# Patient Record
Sex: Female | Born: 1985
Health system: Southern US, Community
[De-identification: ages and names within clinical notes are randomized; demographics above are authoritative.]

## PROBLEM LIST (undated history)

## (undated) ENCOUNTER — Inpatient Hospital Stay (HOSPITAL_COMMUNITY): Payer: Self-pay

## (undated) DIAGNOSIS — J45909 Unspecified asthma, uncomplicated: Secondary | ICD-10-CM

## (undated) DIAGNOSIS — N898 Other specified noninflammatory disorders of vagina: Secondary | ICD-10-CM

## (undated) DIAGNOSIS — R87629 Unspecified abnormal cytological findings in specimens from vagina: Secondary | ICD-10-CM

## (undated) DIAGNOSIS — E079 Disorder of thyroid, unspecified: Secondary | ICD-10-CM

## (undated) DIAGNOSIS — Z3491 Encounter for supervision of normal pregnancy, unspecified, first trimester: Secondary | ICD-10-CM

## (undated) DIAGNOSIS — T7840XA Allergy, unspecified, initial encounter: Secondary | ICD-10-CM

## (undated) DIAGNOSIS — Z3480 Encounter for supervision of other normal pregnancy, unspecified trimester: Secondary | ICD-10-CM

## (undated) DIAGNOSIS — F32A Depression, unspecified: Secondary | ICD-10-CM

## (undated) DIAGNOSIS — K219 Gastro-esophageal reflux disease without esophagitis: Secondary | ICD-10-CM

## (undated) DIAGNOSIS — K589 Irritable bowel syndrome without diarrhea: Secondary | ICD-10-CM

## (undated) DIAGNOSIS — G43909 Migraine, unspecified, not intractable, without status migrainosus: Secondary | ICD-10-CM

## (undated) DIAGNOSIS — Z3490 Encounter for supervision of normal pregnancy, unspecified, unspecified trimester: Secondary | ICD-10-CM

## (undated) DIAGNOSIS — F419 Anxiety disorder, unspecified: Secondary | ICD-10-CM

## (undated) DIAGNOSIS — F329 Major depressive disorder, single episode, unspecified: Secondary | ICD-10-CM

## (undated) DIAGNOSIS — K2970 Gastritis, unspecified, without bleeding: Secondary | ICD-10-CM

## (undated) DIAGNOSIS — O039 Complete or unspecified spontaneous abortion without complication: Secondary | ICD-10-CM

## (undated) HISTORY — DX: Anxiety disorder, unspecified: F41.9

## (undated) HISTORY — DX: Migraine, unspecified, not intractable, without status migrainosus: G43.909

## (undated) HISTORY — DX: Depression, unspecified: F32.A

## (undated) HISTORY — DX: Other specified noninflammatory disorders of vagina: N89.8

## (undated) HISTORY — DX: Encounter for supervision of normal pregnancy, unspecified, unspecified trimester: Z34.90

## (undated) HISTORY — PX: CYST REMOVAL NECK: SHX6281

## (undated) HISTORY — DX: Unspecified asthma, uncomplicated: J45.909

## (undated) HISTORY — DX: Gastritis, unspecified, without bleeding: K29.70

## (undated) HISTORY — DX: Encounter for supervision of other normal pregnancy, unspecified trimester: Z34.80

## (undated) HISTORY — DX: Unspecified abnormal cytological findings in specimens from vagina: R87.629

## (undated) HISTORY — DX: Disorder of thyroid, unspecified: E07.9

## (undated) HISTORY — DX: Encounter for supervision of normal pregnancy, unspecified, first trimester: Z34.91

## (undated) HISTORY — DX: Irritable bowel syndrome, unspecified: K58.9

## (undated) HISTORY — DX: Complete or unspecified spontaneous abortion without complication: O03.9

## (undated) HISTORY — DX: Gastro-esophageal reflux disease without esophagitis: K21.9

## (undated) HISTORY — DX: Major depressive disorder, single episode, unspecified: F32.9

## (undated) HISTORY — DX: Allergy, unspecified, initial encounter: T78.40XA

---
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
## 2008-09-23 ENCOUNTER — Other Ambulatory Visit: Admission: RE | Admit: 2008-09-23 | Discharge: 2008-09-23 | Payer: Self-pay | Admitting: Obstetrics & Gynecology

## 2009-04-11 ENCOUNTER — Ambulatory Visit: Payer: Self-pay | Admitting: Obstetrics & Gynecology

## 2009-04-11 ENCOUNTER — Inpatient Hospital Stay (HOSPITAL_COMMUNITY): Admission: AD | Admit: 2009-04-11 | Discharge: 2009-04-12 | Payer: Self-pay | Admitting: Obstetrics & Gynecology

## 2009-04-11 ENCOUNTER — Inpatient Hospital Stay (HOSPITAL_COMMUNITY): Admission: AD | Admit: 2009-04-11 | Discharge: 2009-04-11 | Payer: Self-pay | Admitting: Obstetrics & Gynecology

## 2009-04-12 ENCOUNTER — Inpatient Hospital Stay (HOSPITAL_COMMUNITY): Admission: AD | Admit: 2009-04-12 | Discharge: 2009-04-14 | Payer: Self-pay | Admitting: Obstetrics and Gynecology

## 2009-04-12 ENCOUNTER — Ambulatory Visit: Payer: Self-pay | Admitting: Family Medicine

## 2009-10-20 ENCOUNTER — Encounter (INDEPENDENT_AMBULATORY_CARE_PROVIDER_SITE_OTHER): Payer: Self-pay | Admitting: *Deleted

## 2009-10-20 ENCOUNTER — Ambulatory Visit (HOSPITAL_COMMUNITY): Admission: RE | Admit: 2009-10-20 | Discharge: 2009-10-20 | Payer: Self-pay | Admitting: Family Medicine

## 2009-10-26 DIAGNOSIS — K5900 Constipation, unspecified: Secondary | ICD-10-CM | POA: Insufficient documentation

## 2009-10-26 DIAGNOSIS — J45909 Unspecified asthma, uncomplicated: Secondary | ICD-10-CM | POA: Insufficient documentation

## 2009-10-30 ENCOUNTER — Ambulatory Visit: Payer: Self-pay | Admitting: Gastroenterology

## 2009-10-30 DIAGNOSIS — R1013 Epigastric pain: Secondary | ICD-10-CM

## 2009-10-30 DIAGNOSIS — K3189 Other diseases of stomach and duodenum: Secondary | ICD-10-CM

## 2009-10-30 DIAGNOSIS — K589 Irritable bowel syndrome without diarrhea: Secondary | ICD-10-CM | POA: Insufficient documentation

## 2009-12-19 ENCOUNTER — Ambulatory Visit: Payer: Self-pay | Admitting: Gastroenterology

## 2010-01-25 ENCOUNTER — Other Ambulatory Visit: Admission: RE | Admit: 2010-01-25 | Discharge: 2010-01-25 | Payer: Self-pay | Admitting: Obstetrics and Gynecology

## 2010-06-21 NOTE — Letter (Signed)
Summary: Results Letter  [facility]  [address] [place], [place] [address]   Phone: [phone]  Fax: [phone]        October 30, 2009 MRN: [id number]    ROLINDA IMPSON [address] [place], [place]  [address]    Dear Ms. Roney Marion,  It is my pleasure to have treated you recently as a new patient in my office. I appreciate your confidence and the opportunity to participate in your care.  Since I do have a busy inpatient endoscopy schedule and office schedule, my office hours vary weekly. I am, however, available for emergency calls everyday through my office. If I am not available for an urgent office appointment, another one of our gastroenterologist will be able to assist you.  My well-trained staff are prepared to help you at all times. For emergencies after office hours, a physician from our Gastroenterology section is always available through my 24 hour answering service  Once again I welcome you as a new patient and I look forward to a happy and healthy relationship             Sincerely,  Louis Meckel MD  This letter has been electronically signed by your physician.  Appended Document: Results Letter LETTER MAILED

## 2010-06-21 NOTE — Assessment & Plan Note (Signed)
Summary: F/U APPT...LSW.   History of Present Illness Visit Type: Follow-up Visit Primary GI MD: Melvia Heaps MD [facility] Primary Provider: Lilyan Punt, MD Requesting Provider: na Chief Complaint: F/u for Epigastric pain. Pt c/o mucus after BMs  History of Present Illness:   [name] has returned for followup of her abdominal pain.  Pain has primarily subsided.  She occasionally has postprandial upper abdominal discomfort that tends to occur when  she eats greasy foods or high in sugar content such as sodas.  She has intermittent nausea which tends to occur in the evenings.  On a few occasions she has seen some mucus in the stools.  She denies melena  or hematochezia.   GI Review of Systems      Denies abdominal pain, acid reflux, belching, bloating, chest pain, dysphagia with liquids, dysphagia with solids, heartburn, loss of appetite, nausea, vomiting, vomiting blood, weight loss, and  weight gain.        Denies anal fissure, black tarry stools, change in bowel habit, constipation, diarrhea, diverticulosis, fecal incontinence, heme positive stool, hemorrhoids, irritable bowel syndrome, jaundice, light color stool, liver problems, rectal bleeding, and  rectal pain.    Current Medications (verified): 1)  Maxalt 10 Mg Tabs (Rizatriptan Benzoate) .... Take One By Mouth As Needed 2)  Ativan 1 Mg Tabs (Lorazepam) .... Take One By Mouth Once Daily 3)  Zofran 4 Mg Tabs (Ondansetron Hcl) .... Take One By Mouth As Needed 4)  Carafate 1 Gm/54ml Susp (Sucralfate) .... Take One Gram After Meals and At Bedtime 5)  Ventolin Hfa 108 (90 Base) Mcg/act Aers (Albuterol Sulfate) .... Use As Directed As Needed 6)  Prozac 10 Mg Caps (Fluoxetine Hcl) .... One Tablet By Mouth Once Daily 7)  Align  Caps (Probiotic Product) .... One Capsule By Mouth Once Daily  Allergies (verified): 1)  ! Advil  Past History:  Past Medical History: Reviewed history from 10/26/2009 and no changes required. Current  Problems:  ASTHMA (ICD-493.90) ANXIETY (ICD-300.00) CONSTIPATION (ICD-564.00)  Past Surgical History: Reviewed history from 10/30/2009 and no changes required. Unremarkable  Family History: Reviewed history from 10/30/2009 and no changes required. No FH of Colon Cancer: Melanoma-uncle/mother and brother (precancerous)  Social History: Reviewed history from 10/30/2009 and no changes required. Patient has never smoked.  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  The patient denies allergy/sinus, anemia, anxiety-new, arthritis/joint pain, back pain, blood in urine, breast changes/lumps, change in vision, confusion, cough, coughing up blood, depression-new, fainting, fatigue, fever, hearing problems, heart murmur, heart rhythm changes, itching, menstrual pain, muscle pains/cramps, night sweats, nosebleeds, pregnancy symptoms, shortness of breath, skin rash, sleeping problems, sore throat, swelling of feet/legs, swollen lymph glands, thirst - excessive , urination - excessive , urination changes/pain, urine leakage, vision changes, and voice change.    Vital Signs:  Patient profile:   25 year old female Height:      64 inches Weight:      111 pounds BMI:     19.12 BSA:     1.52 Pulse rate:   88 / minute Pulse rhythm:   regular BP sitting:   110 / 64  (left arm) Cuff size:   regular  Vitals Entered By: Ok Anis CMA (December 19, 2009 2:24 PM)   Impression & Recommendations:  Problem # 1:  DYSPEPSIA&OTHER SPEC DISORDERS FUNCTION STOMACH (ICD-536.8)  Assessment Improved Plan to continue Carafate.  I will add  hyomax p.r.n.  Problem # 2:  IBS (ICD-564.1) Assessment: Comment Only  Problem # 3:  ANXIETY (ICD-300.00) Assessment: Improved Plan to continue Prozac  Patient Instructions: 1)  Copy sent to : Lilyan Punt, MD 2)  The medication list was reviewed and  reconciled.  All changed / newly prescribed medications were explained.  A complete medication list was provided to the patient / caregiver. Prescriptions: HYOMAX-SL 0.125 MG SUBL (HYOSCYAMINE SULFATE) take 2 tablets sublingual q.4 h. p.r.n. abdominal pain  #15 x 1   Entered and Authorized by:   Louis Meckel MD   Signed by:   Louis Meckel MD on 12/19/2009   Method used:   [facility] to        [phone][facility] (retail)       [address]       [place], [place]  [address]       Ph: [phone] or [phone]       Fax: [phone]   RxID:   [id number]

## 2010-06-21 NOTE — Assessment & Plan Note (Signed)
Summary: PROGRESSIVE R UPPER QUATRNT & EPIGASTRIC PAIN/YF   History of Present Illness Visit Type: Initial Consult Primary GI MD: Melvia Heaps MD [facility] Primary Provider: Lilyan Punt, MD Requesting Provider: Lilyan Punt, MD Chief Complaint: LUQ pain and Epigastric pain History of Present Illness:   [name] is a pleasant 25 year old white female referred at the request of Dr. Gerda Diss for evaluation of abdominall pain and nausea.  For several months she has been complaining of vague upper abdominal pain.  It is worsened postprandially and accompanied  by nausea.  She denies pyrosis or vomiting.  She has a history of IBS characterized by very erratic bowel with both constipation and diarrhea.  She apparantly underwent blood work and ultrasound which were unremarkable.  She is on both Prilosec and Carafate.  She was started on Prozac about a month ago.  At this point abdominal pain has largely subsided.  She still complains of nausea that  tends  to occur in the evenings.  Anxiety has been a factor and has worsened since she has been under great distress.  She takes an injection yearly for contraception. She is on no gastric irritants including nonsteroidals.   GI Review of Systems    Reports abdominal pain and  nausea.     Location of  Abdominal pain: upper abdomen.    Denies acid reflux, belching, bloating, chest pain, dysphagia with liquids, dysphagia with solids, heartburn, loss of appetite, vomiting, vomiting blood, weight loss, and  weight gain.      Reports diarrhea.     Denies anal fissure, black tarry stools, change in bowel habit, constipation, diverticulosis, fecal incontinence, heme positive stool, hemorrhoids, irritable bowel syndrome, jaundice, light color stool, liver problems, rectal bleeding, and  rectal pain.    Current Medications (verified): 1)  Maxalt 10 Mg Tabs (Rizatriptan Benzoate) .... Take One By Mouth As Needed 2)  Ativan 1 Mg Tabs (Lorazepam) .... Take One By Mouth  Once Daily 3)  Zofran 4 Mg Tabs (Ondansetron Hcl) .... Take One By Mouth As Needed 4)  Carafate 1 Gm/81ml Susp (Sucralfate) .... Take One Gram After Meals and At Bedtime 5)  Prilosec Otc 20 Mg Tbec (Omeprazole Magnesium) .... Take One By Mouth Once Daily 6)  Ventolin Hfa 108 (90 Base) Mcg/act Aers (Albuterol Sulfate) .... Use As Directed As Needed  Allergies (verified): 1)  ! Advil  Past History:  Past Medical History: Reviewed history from 10/26/2009 and no changes required. Current Problems:  ASTHMA (ICD-493.90) ANXIETY (ICD-300.00) CONSTIPATION (ICD-564.00)  Past Surgical History: Unremarkable  Family History: Reviewed history and no changes required. No FH of Colon Cancer: Melanoma-uncle/mother and brother (precancerous)  Social History: Reviewed history from 10/26/2009 and no changes required. Patient has never smoked.  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  The patient denies allergy/sinus, anemia, anxiety-new, arthritis/joint pain, back pain, blood in urine, breast changes/lumps, change in vision, confusion, cough, coughing up blood, depression-new, fainting, fatigue, fever, hearing problems, heart murmur, heart rhythm changes, itching, menstrual pain, muscle pains/cramps, nosebleeds, pregnancy symptoms, skin rash, sleeping problems, sore throat, swelling of feet/legs, swollen lymph glands, thirst - excessive , urination - excessive , urination changes/pain, urine leakage, vision changes, and voice change.         All other systems were reviewed and were negative   Vital Signs:  Patient profile:   25 year old female Height:      64 inches Weight:      112.50 pounds BMI:     19.38 Pulse rate:   96 / minute Pulse rhythm:   102regular BP sitting:   102 / 58  (left arm)  Vitals Entered By: Milford Cage NCMA (October 30, 2009 3:30 PM)  Physical Exam  Additional Exam:  N. physical exam she is a healthy but thin-appearing female  skin: anicteric HEENT: normocephalic; PEERLA; no nasal or pharyngeal abnormalities neck: supple nodes: no cervical lymphadenopathy chest: clear to ausculatation and percussion heart: no murmurs, gallops, or rubs abd: soft, nontender; BS normoactive; no abdominal masses, tenderness, organomegaly rectal: deferred ext: no cynanosis, clubbing, edema skeletal: no deformities neuro: oriented x 3; no focal abnormalities    Impression & Recommendations:  Problem # 1:  DYSPEPSIA&OTHER SPEC DISORDERS FUNCTION STOMACH (ICD-536.8) The patient's symptoms are probably related to nonulcer dyspepsia and exacerbated by stress and anxiety.  Medication effect is less likely.  Recommendations #1 continue Prozac #2 discontinue Prilosec and continue Carafate #3 if pain recurs I would consider upper endoscopy  Problem # 2:  IBS (ICD-564.1) Plan fiber supplementation  Patient Instructions: 1)  Copy sent to : Scott Luking,MD 2)  You will need to make a follow up appointment in 4-6 weeks 3)  The medication list was reviewed and reconciled.  All changed / newly prescribed medications were explained.  A complete medication list was provided to the patient / caregiver.

## 2010-07-28 IMAGING — US US ABDOMEN COMPLETE
1 series · 14 of 25 positions shown · non-contrast
Comparison: None

CLINICAL DATA: Abdominal pain

COMPLETE ABDOMINAL ULTRASOUND:

[Series 1: us abdomen complete · 0.18mm/px · 14 of 76 slices shown]
[im 1/76]
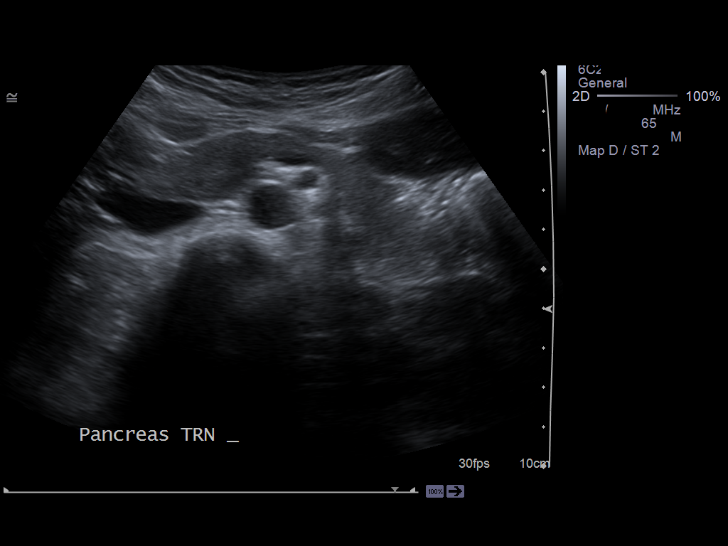
[im 7/76]
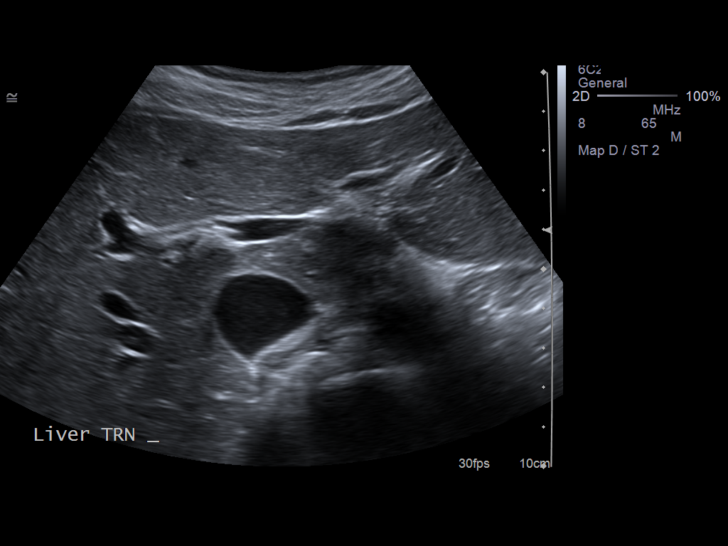
[im 13/76]
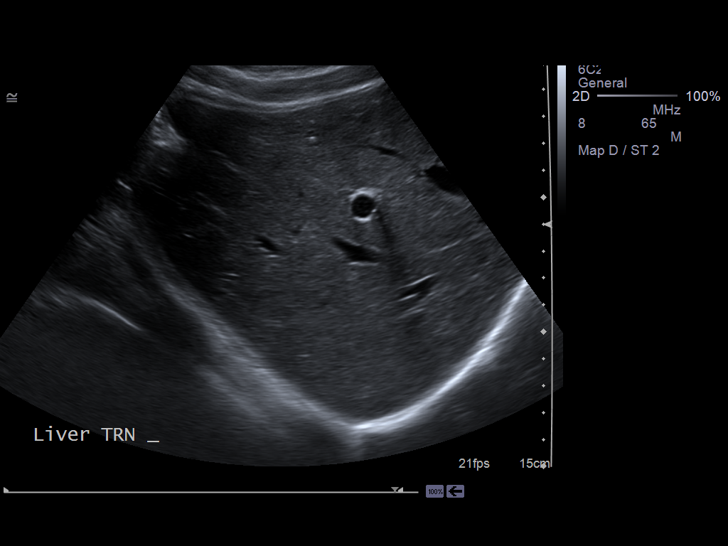
[im 19/76]
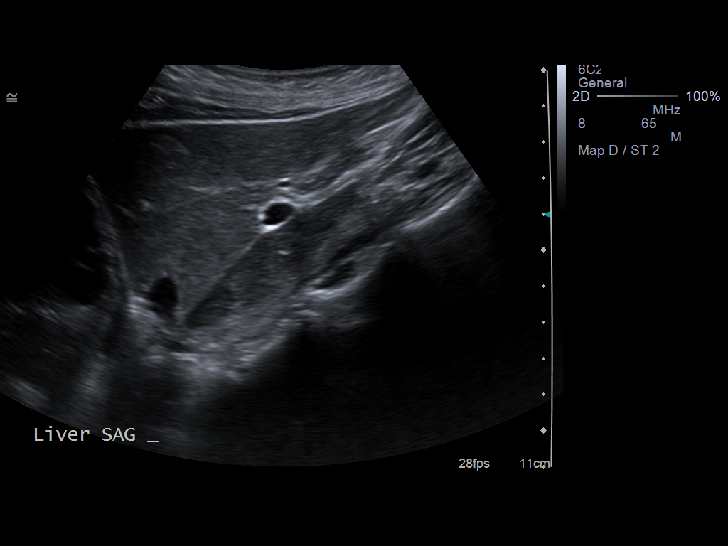
[im 26/76]
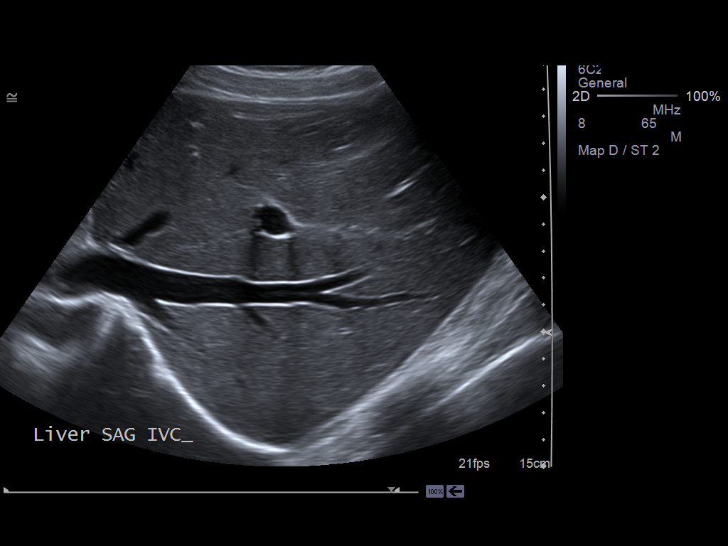
[im 29/76]
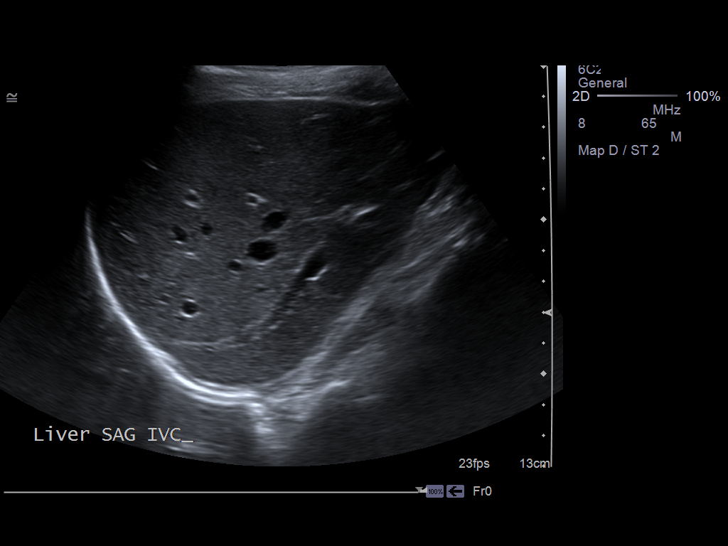
[im 35/76]
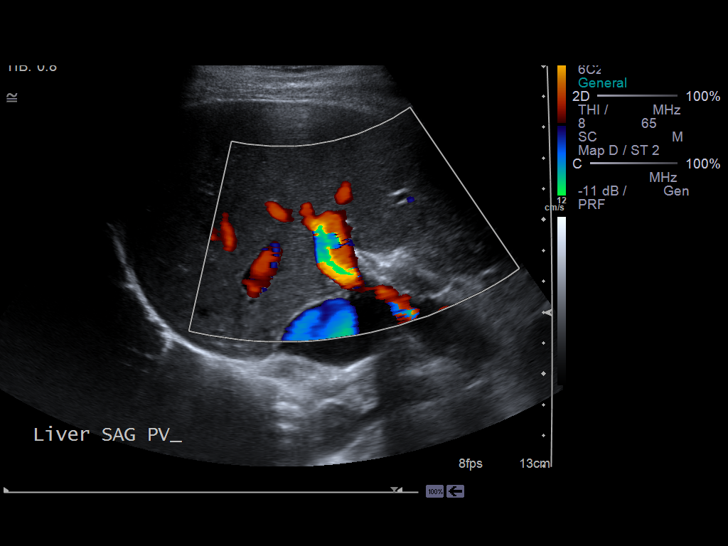
[im 41/76]
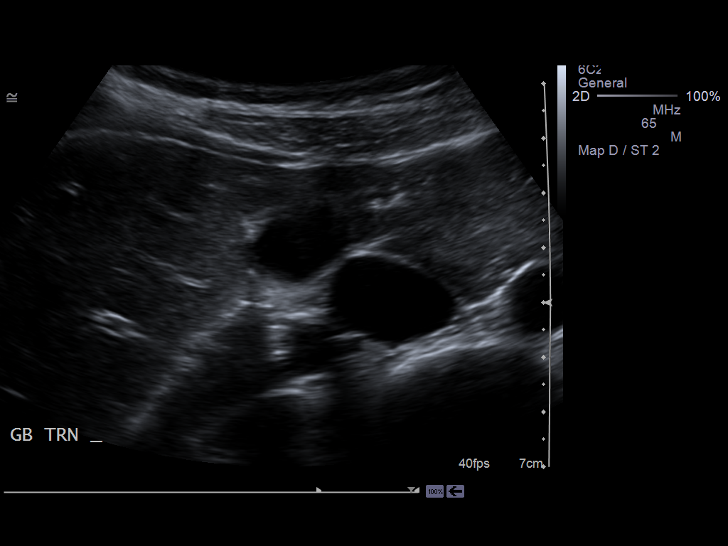
[im 47/76]
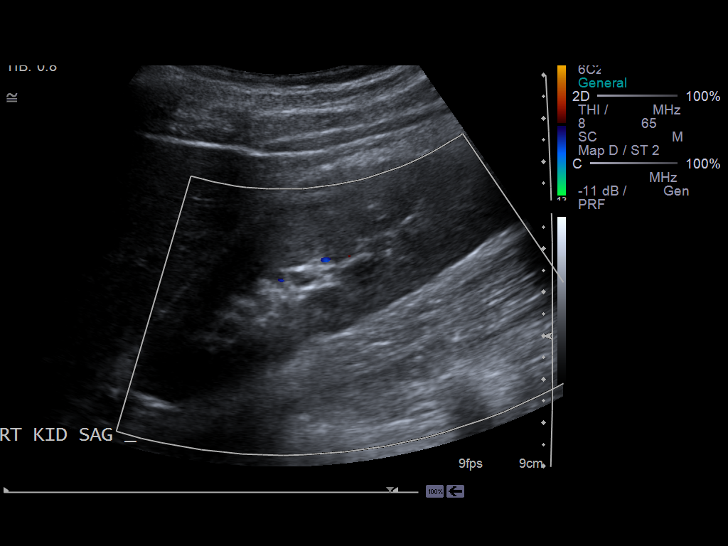
[im 51/76]
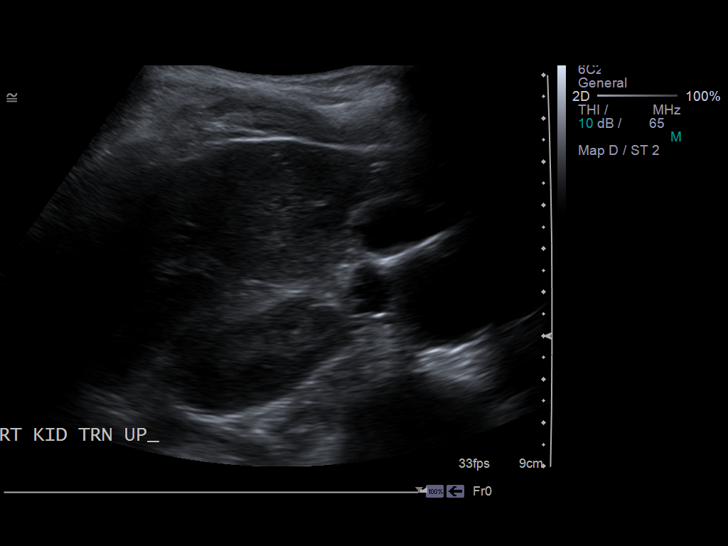
[im 57/76]
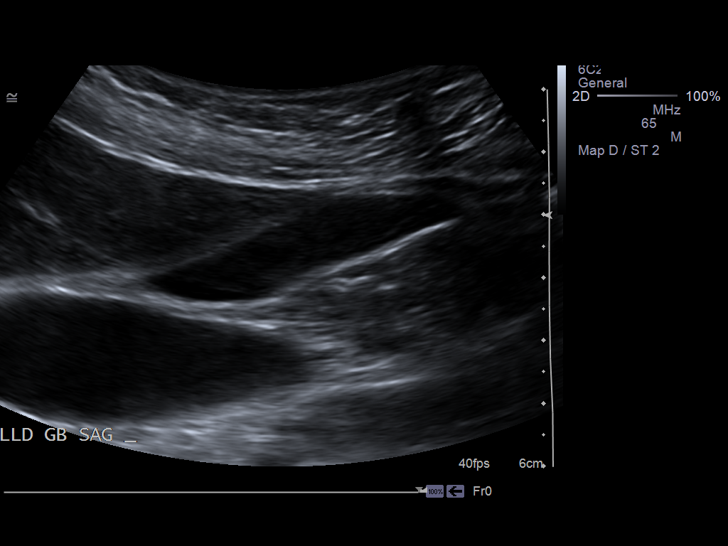
[im 63/76]
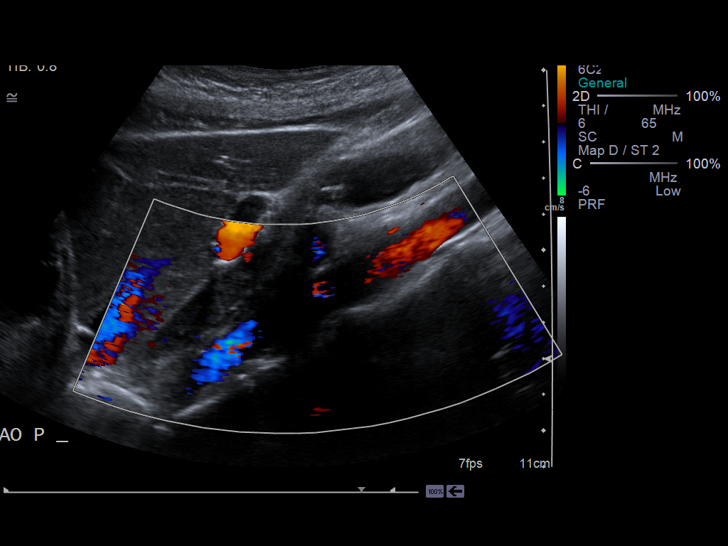
[im 69/76]
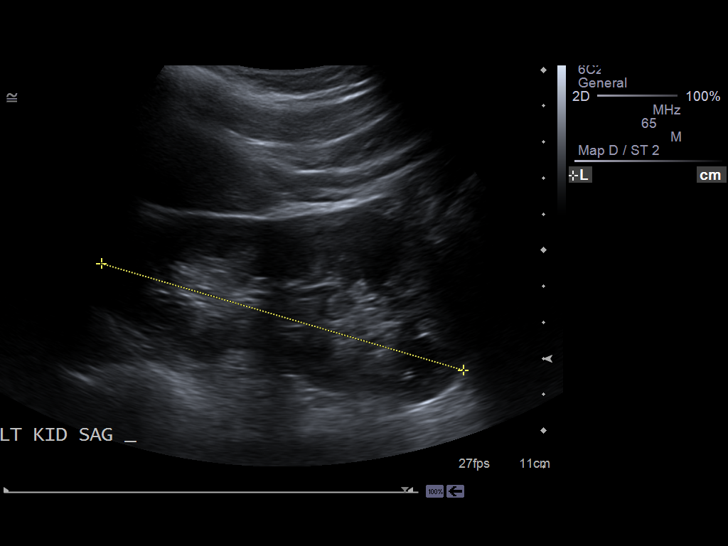
[im 76/76]
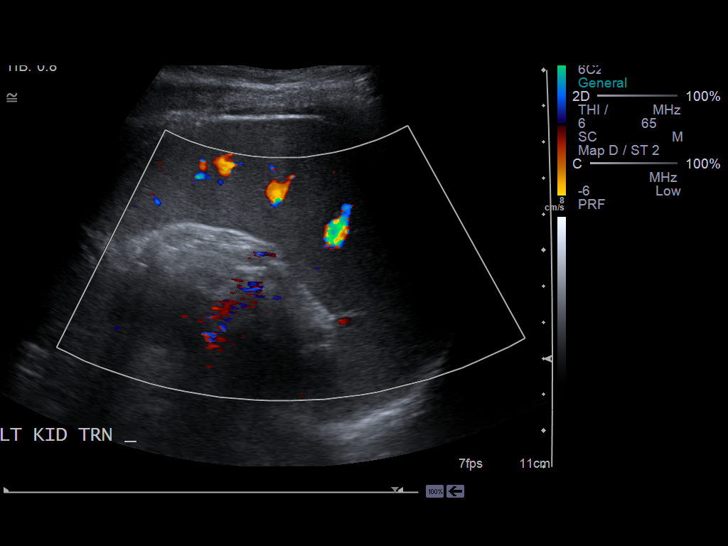

[14 of 25 positions shown; findings below may reference images not displayed]

Gallbladder:      Normally distended without stones or wall
            thickening
            No pericholecystic fluid or sonographic Murphy sign

Common bile duct:  3 mm diameter

Liver:  Normal appearance

IVC:  Unremarkable

Pancreas:  Normal appearance

Spleen:  Normal appearance, 9.2 cm length length

Right Kidney:  Normal size and morphology, 10.5 cm length

Left Kidney:  Normal size and morphology, 10.5 cm length

Abdominal aorta:  Unremarkable

No ascites
IMPRESSION: Normal upper abdominal ultrasound.

## 2010-08-22 LAB — CBC
HCT: 36.9 % (ref 36.0–46.0)
Hemoglobin: 12.7 g/dL (ref 12.0–15.0)
MCHC: 34.5 g/dL (ref 30.0–36.0)
MCV: 96.9 fL (ref 78.0–100.0)
Platelets: 148 10*3/uL — ABNORMAL LOW (ref 150–400)
RBC: 3.81 MIL/uL — ABNORMAL LOW (ref 3.87–5.11)
RDW: 13.1 % (ref 11.5–15.5)
WBC: 19.8 10*3/uL — ABNORMAL HIGH (ref 4.0–10.5)

## 2010-08-22 LAB — RPR: RPR Ser Ql: NONREACTIVE

## 2011-01-31 ENCOUNTER — Other Ambulatory Visit (HOSPITAL_COMMUNITY)
Admission: RE | Admit: 2011-01-31 | Discharge: 2011-01-31 | Disposition: A | Discharge status: Home / Self Care | Payer: BC Managed Care – PPO | Source: Ambulatory Visit | Attending: Obstetrics and Gynecology | Admitting: Obstetrics and Gynecology

## 2011-01-31 DIAGNOSIS — Z113 Encounter for screening for infections with a predominantly sexual mode of transmission: Secondary | ICD-10-CM | POA: Insufficient documentation

## 2011-01-31 DIAGNOSIS — Z01419 Encounter for gynecological examination (general) (routine) without abnormal findings: Secondary | ICD-10-CM | POA: Insufficient documentation

## 2011-05-16 ENCOUNTER — Other Ambulatory Visit (HOSPITAL_COMMUNITY): Payer: Self-pay | Admitting: Physical Medicine and Rehabilitation

## 2011-05-16 DIAGNOSIS — M799 Soft tissue disorder, unspecified: Secondary | ICD-10-CM

## 2011-05-22 ENCOUNTER — Ambulatory Visit (HOSPITAL_COMMUNITY): Payer: BC Managed Care – PPO

## 2012-06-09 ENCOUNTER — Other Ambulatory Visit (HOSPITAL_COMMUNITY)
Admission: RE | Admit: 2012-06-09 | Discharge: 2012-06-09 | Disposition: A | Discharge status: Home / Self Care | Payer: PRIVATE HEALTH INSURANCE | Source: Ambulatory Visit | Attending: Obstetrics and Gynecology | Admitting: Obstetrics and Gynecology

## 2012-06-09 DIAGNOSIS — N6459 Other signs and symptoms in breast: Secondary | ICD-10-CM | POA: Insufficient documentation

## 2012-08-14 ENCOUNTER — Telehealth: Payer: Self-pay | Admitting: Family Medicine

## 2012-08-14 ENCOUNTER — Other Ambulatory Visit (HOSPITAL_COMMUNITY): Payer: Self-pay | Admitting: Family Medicine

## 2012-08-14 NOTE — Telephone Encounter (Signed)
Nurse: 2:58 Patient: Brianna Griffin dob 09/11/1985 [facility] RX: Maxalt CB: [phone]  MSG: Victorino Dike [facility] and the prescription has age out of coverage time. Patient needs to have provider place in a new order

## 2012-08-14 NOTE — Telephone Encounter (Signed)
Med called into [facility] per doctors orders.

## 2012-08-14 NOTE — Telephone Encounter (Signed)
Yes, may refill with three refills

## 2012-09-02 ENCOUNTER — Other Ambulatory Visit: Payer: Self-pay | Admitting: Adult Health

## 2012-09-08 ENCOUNTER — Encounter: Payer: Self-pay | Admitting: Obstetrics & Gynecology

## 2012-09-08 ENCOUNTER — Ambulatory Visit (INDEPENDENT_AMBULATORY_CARE_PROVIDER_SITE_OTHER): Payer: No Typology Code available for payment source | Admitting: Obstetrics & Gynecology

## 2012-09-08 VITALS — BP 110/70 | Ht 64.0 in | Wt 132.0 lb

## 2012-09-08 DIAGNOSIS — Z3202 Encounter for pregnancy test, result negative: Secondary | ICD-10-CM

## 2012-09-08 DIAGNOSIS — Z309 Encounter for contraceptive management, unspecified: Secondary | ICD-10-CM

## 2012-09-08 DIAGNOSIS — Z3049 Encounter for surveillance of other contraceptives: Secondary | ICD-10-CM

## 2012-09-08 LAB — POCT URINE PREGNANCY: Preg Test, Ur: NEGATIVE

## 2012-09-08 MED ORDER — MEDROXYPROGESTERONE ACETATE 150 MG/ML IM SUSP
150.0000 mg | Freq: Once | INTRAMUSCULAR | Status: AC
  Administered 2012-09-08: 150 mg via INTRAMUSCULAR

## 2012-10-28 ENCOUNTER — Ambulatory Visit (INDEPENDENT_AMBULATORY_CARE_PROVIDER_SITE_OTHER): Payer: No Typology Code available for payment source | Admitting: Nurse Practitioner

## 2012-10-28 ENCOUNTER — Encounter: Payer: Self-pay | Admitting: Nurse Practitioner

## 2012-10-28 VITALS — BP 101/62 | Temp 98.3°F | Wt 136.2 lb

## 2012-10-28 DIAGNOSIS — D2320 Other benign neoplasm of skin of unspecified ear and external auricular canal: Secondary | ICD-10-CM

## 2012-10-28 DIAGNOSIS — D2322 Other benign neoplasm of skin of left ear and external auricular canal: Secondary | ICD-10-CM

## 2012-10-28 MED ORDER — DOXYCYCLINE HYCLATE 100 MG PO TABS: 100.0000 mg | ORAL_TABLET | Freq: Two times a day (BID) | ORAL | Status: DC

## 2012-10-29 NOTE — Progress Notes (Signed)
Subjective:  Presents with complaints of a sore area on the outer left ear for the past week. Was very swollen one point. Has been able to get some clear drainage for the past few days, minimal odor. No fever.  Objective:   BP 101/62  Temp(Src) 98.3 F (36.8 C)  Wt 136 lb 3.2 oz (61.78 kg)  BMI 23.37 kg/m2 NAD. Alert, oriented. A pink cystic lesion noted in the left ear at the concha. The left auditory canal is clear. There is a tiny hole in the lesion, nontender to palpation, no active drainage.   Assessment:Cyst, dermoid, ear, left  small abscess versus cystic lesion  Plan: Doxycycline 100 mg 1 by mouth twice a day x7 days. Warm compresses to area. Call back if persists.

## 2012-10-30 ENCOUNTER — Other Ambulatory Visit: Payer: Self-pay | Admitting: Nurse Practitioner

## 2012-10-30 ENCOUNTER — Encounter: Payer: Self-pay | Admitting: Family Medicine

## 2012-10-30 MED ORDER — CEPHALEXIN 500 MG PO CAPS: 500.0000 mg | ORAL_CAPSULE | Freq: Three times a day (TID) | ORAL | Status: DC

## 2012-11-16 ENCOUNTER — Ambulatory Visit (INDEPENDENT_AMBULATORY_CARE_PROVIDER_SITE_OTHER): Payer: No Typology Code available for payment source | Admitting: Nurse Practitioner

## 2012-11-16 ENCOUNTER — Encounter: Payer: Self-pay | Admitting: Nurse Practitioner

## 2012-11-16 ENCOUNTER — Encounter: Payer: Self-pay | Admitting: Family Medicine

## 2012-11-16 VITALS — BP 110/62 | Temp 98.3°F | Wt 135.2 lb

## 2012-11-16 DIAGNOSIS — H8110 Benign paroxysmal vertigo, unspecified ear: Secondary | ICD-10-CM

## 2012-11-16 NOTE — Patient Instructions (Addendum)
Nasacort A Meclizine for dizziness

## 2012-11-18 ENCOUNTER — Encounter: Payer: Self-pay | Admitting: Nurse Practitioner

## 2012-11-18 ENCOUNTER — Telehealth: Payer: Self-pay | Admitting: Family Medicine

## 2012-11-18 MED ORDER — NITROFURANTOIN MONOHYD MACRO 100 MG PO CAPS: 100.0000 mg | ORAL_CAPSULE | Freq: Two times a day (BID) | ORAL | Status: AC

## 2012-11-18 NOTE — Telephone Encounter (Signed)
Macrobid 100 bid for five days sent into [facility]. Patient was notified.

## 2012-11-18 NOTE — Telephone Encounter (Signed)
Pt wants to know if you can call in some Macrobid for her? She is having UTI symptoms and states that you normally just call it in for her... Use [facility]

## 2012-11-18 NOTE — Progress Notes (Signed)
Subjective:  Presents with complaints of dizziness that began about 6:00 this morning. Has occurred off and on all day. Slightly better this afternoon. Slight nausea this morning, no vomiting. Very mild headache. No fever. No head congestion runny nose cough sore throat or ear pain. Occurs at times with sudden movements. No syncope. No photosensitivity or photophobia. No visual changes. No numbness or weakness of the face arms or legs. No chest pain or shortness of breath. No palpitations. Ran out of her Prozac, has not had a dose in over 48 hours.  Objective:   BP 118/72  Temp(Src) 98.3 F (36.8 C) (Oral)  Wt 135 lb 4 oz (61.349 kg)  BMI 23.2 kg/m2 NAD. Alert, oriented. TMs mild clear effusion, no erythema. Pharynx injected with clear PND noted. Neck supple with mild soft nontender adenopathy. Lungs clear. Heart regular rate rhythm. Funduscopic optic disc sharp. PERR L. EOMs intact without nystagmus. Point-to-point localization normal limit. Reflexes normal limit. Cranial nerves grossly intact. Romberg negative.  Assessment:Benign paroxysmal positional vertigo  most likely due to mild serotonin withdrawal syndrome from missing her doses of Prozac or mild inner ear dysfunction  Plan: Restart Prozac today. Start Nasacort AQ as directed. OTC meclizine as directed for dizziness. Drowsiness precautions. Call back in 72 hours if no improvement, sooner if any problems.

## 2012-11-18 NOTE — Telephone Encounter (Signed)
macrobid 100 bid for five days 

## 2012-12-01 ENCOUNTER — Encounter: Payer: Self-pay | Admitting: Adult Health

## 2012-12-01 ENCOUNTER — Ambulatory Visit (INDEPENDENT_AMBULATORY_CARE_PROVIDER_SITE_OTHER): Payer: No Typology Code available for payment source | Admitting: Adult Health

## 2012-12-01 ENCOUNTER — Ambulatory Visit: Payer: No Typology Code available for payment source

## 2012-12-01 VITALS — BP 120/78 | Ht 64.0 in | Wt 137.0 lb

## 2012-12-01 DIAGNOSIS — Z309 Encounter for contraceptive management, unspecified: Secondary | ICD-10-CM

## 2012-12-01 DIAGNOSIS — Z3049 Encounter for surveillance of other contraceptives: Secondary | ICD-10-CM

## 2012-12-01 DIAGNOSIS — Z3202 Encounter for pregnancy test, result negative: Secondary | ICD-10-CM

## 2012-12-01 DIAGNOSIS — Z3200 Encounter for pregnancy test, result unknown: Secondary | ICD-10-CM

## 2012-12-01 LAB — POCT URINE PREGNANCY: Preg Test, Ur: NEGATIVE

## 2012-12-01 MED ORDER — MEDROXYPROGESTERONE ACETATE 150 MG/ML IM SUSP
150.0000 mg | Freq: Once | INTRAMUSCULAR | Status: AC
  Administered 2012-12-01: 150 mg via INTRAMUSCULAR

## 2012-12-26 ENCOUNTER — Other Ambulatory Visit: Payer: Self-pay | Admitting: Family Medicine

## 2012-12-31 ENCOUNTER — Encounter: Payer: Self-pay | Admitting: Family Medicine

## 2012-12-31 ENCOUNTER — Ambulatory Visit (INDEPENDENT_AMBULATORY_CARE_PROVIDER_SITE_OTHER): Payer: No Typology Code available for payment source | Admitting: Family Medicine

## 2012-12-31 VITALS — BP 128/82 | Ht 64.0 in | Wt 141.6 lb

## 2012-12-31 DIAGNOSIS — N390 Urinary tract infection, site not specified: Secondary | ICD-10-CM

## 2012-12-31 DIAGNOSIS — R300 Dysuria: Secondary | ICD-10-CM

## 2012-12-31 DIAGNOSIS — F411 Generalized anxiety disorder: Secondary | ICD-10-CM

## 2012-12-31 LAB — POCT URINALYSIS DIPSTICK
Spec Grav, UA: <=1.005
pH, UA: 7

## 2012-12-31 MED ORDER — FLUOXETINE HCL 20 MG PO CAPS: 20.0000 mg | ORAL_CAPSULE | Freq: Every day | ORAL | Status: DC

## 2012-12-31 NOTE — Addendum Note (Signed)
Addended byOneal Deputy D on: 12/31/2012 02:16 PM   Modules accepted: Orders

## 2012-12-31 NOTE — Progress Notes (Signed)
  Subjective:    Patient ID: Brianna Griffin, female    DOB: [birth year], 27 y.o.   MRN: [id number]  HPI Noticed increased urination, urgency, up at night some Increase of prozac seemed to help. Anxiety has calmed own. Taking ativan regularly.  Considering ongoing counselling  Newly engaged.  Some challenges with daughter and new fiance.  Reg exercise not good, non smoker   Review of Systems No vomiting no fever no diarrhea ROS otherwise negative    Objective:   Physical Exam  Alert lungs clear. Heart regular rate and rhythm. Vital stable. No CVA tenderness.      Assessment & Plan:  Impression possible UTI #2 depression clinically stable. Plan maintain same dose Prozac. Culture urine. Call Monday regarding results in nature it. WSL

## 2013-01-04 MED ORDER — CIPROFLOXACIN HCL 250 MG PO TABS: 250.0000 mg | ORAL_TABLET | Freq: Two times a day (BID) | ORAL | Status: AC

## 2013-01-04 NOTE — Addendum Note (Signed)
Addended by: Margaretha Sheffield on: 01/04/2013 04:35 PM   Modules accepted: Orders

## 2013-01-07 ENCOUNTER — Encounter: Payer: Self-pay | Admitting: *Deleted

## 2013-02-08 ENCOUNTER — Other Ambulatory Visit: Payer: Self-pay | Admitting: Family Medicine

## 2013-02-23 ENCOUNTER — Ambulatory Visit (INDEPENDENT_AMBULATORY_CARE_PROVIDER_SITE_OTHER): Payer: No Typology Code available for payment source | Admitting: Adult Health

## 2013-02-23 ENCOUNTER — Encounter: Payer: Self-pay | Admitting: Adult Health

## 2013-02-23 VITALS — BP 110/60 | Ht 64.0 in | Wt 142.0 lb

## 2013-02-23 DIAGNOSIS — Z3049 Encounter for surveillance of other contraceptives: Secondary | ICD-10-CM

## 2013-02-23 DIAGNOSIS — Z3200 Encounter for pregnancy test, result unknown: Secondary | ICD-10-CM

## 2013-02-23 DIAGNOSIS — Z309 Encounter for contraceptive management, unspecified: Secondary | ICD-10-CM

## 2013-02-23 DIAGNOSIS — Z3202 Encounter for pregnancy test, result negative: Secondary | ICD-10-CM

## 2013-02-23 LAB — POCT URINE PREGNANCY: Preg Test, Ur: NEGATIVE

## 2013-02-23 MED ORDER — MEDROXYPROGESTERONE ACETATE 150 MG/ML IM SUSP
150.0000 mg | Freq: Once | INTRAMUSCULAR | Status: AC
  Administered 2013-02-23: 150 mg via INTRAMUSCULAR

## 2013-03-25 ENCOUNTER — Other Ambulatory Visit: Payer: Self-pay

## 2013-05-06 ENCOUNTER — Encounter: Payer: Self-pay | Admitting: Family Medicine

## 2013-05-06 ENCOUNTER — Ambulatory Visit (INDEPENDENT_AMBULATORY_CARE_PROVIDER_SITE_OTHER): Payer: Medicaid Other | Admitting: Family Medicine

## 2013-05-06 VITALS — BP 122/78 | Ht 64.0 in | Wt 144.8 lb

## 2013-05-06 DIAGNOSIS — Z23 Encounter for immunization: Secondary | ICD-10-CM

## 2013-05-06 DIAGNOSIS — J45909 Unspecified asthma, uncomplicated: Secondary | ICD-10-CM

## 2013-05-06 MED ORDER — FLUOXETINE HCL 10 MG PO TABS: ORAL_TABLET | ORAL | Status: DC

## 2013-05-06 NOTE — Progress Notes (Signed)
   Subjective:    Patient ID: Brianna Griffin, female    DOB: 07-10-1985, 27 y.o.   MRN: [id number]  HPI Patient arrives for follow up on depression and anxiety.   Taking prozac currently. Seems to be helping some, has mixed feelings.  Went to the counselling cntr, and got a Rx for something else, considered a change. Has had low spell with depression and having lost g father, was very close  Has been off ativan for a couple mo's  Patient states she is getting remarried in January and wants to try for pregnancy very soon after and wants to make sure her meds are on track for that.  Still on depoprovera  Wheezing tendencies much improved, no flu shot yet this yr  Reflux some issues at times,  Review of Systems Chest pain no headache no back pain ROS otherwise negative    Objective:   Physical Exam  Alert no acute distress. Lungs currently clear. Heart rare rhythm. HEENT normal. Abdomen benign.      Assessment & Plan:  Impression 1 asthma clinically stable. #2 reflux fairly stable. #3 chronic anxiety with desire to get off medication discussed Prozac is schedule see. Plan reduce to 10 mg daily for 10 days then every other day for another 8 days then stop. Rationale discussed. Exercise encourage. WSL

## 2013-05-18 ENCOUNTER — Ambulatory Visit: Payer: No Typology Code available for payment source

## 2013-05-21 ENCOUNTER — Telehealth: Payer: Self-pay | Admitting: Family Medicine

## 2013-05-21 MED ORDER — CEFPROZIL 500 MG PO TABS: 500.0000 mg | ORAL_TABLET | Freq: Two times a day (BID) | ORAL | Status: DC

## 2013-05-21 NOTE — Telephone Encounter (Signed)
Can call in Cefzil 500 one bid 10 days, if resp distress or worse then ER

## 2013-05-21 NOTE — Telephone Encounter (Signed)
Med sent to pharm. Discussed with pt if resp dis or worse to go to ER.

## 2013-05-21 NOTE — Telephone Encounter (Signed)
Pt in office 05/06/13 for check up on meds. Stated she talked to Dr. Richardson Landry about sinus symptoms and he wrote down her symptoms. She stated Dr. Richardson Landry told her to call back if not better and he would call in antibiotic. I read office note and do not see any notes about sinus symptoms. She is having nasal congestion, green drainage, sore throat, fever 100.7( the highest). No wheezing. Can something be called in or does she need office visit.

## 2013-05-21 NOTE — Telephone Encounter (Signed)
Pt was seen 12/18, Dr Richardson Landry told her at that time if her sinus symptoms did not improve over the next week or so to call back in an he would call her in an antibiotic.   [facility]

## 2013-05-25 ENCOUNTER — Encounter: Payer: Self-pay | Admitting: Family Medicine

## 2013-06-01 ENCOUNTER — Encounter: Payer: Self-pay | Admitting: Family Medicine

## 2013-06-01 ENCOUNTER — Ambulatory Visit (INDEPENDENT_AMBULATORY_CARE_PROVIDER_SITE_OTHER): Payer: Medicaid Other | Admitting: Family Medicine

## 2013-06-01 VITALS — BP 110/78 | Temp 98.5°F | Ht 64.0 in | Wt 150.2 lb

## 2013-06-01 DIAGNOSIS — J310 Chronic rhinitis: Secondary | ICD-10-CM

## 2013-06-01 DIAGNOSIS — J329 Chronic sinusitis, unspecified: Secondary | ICD-10-CM

## 2013-06-01 MED ORDER — CLARITHROMYCIN 500 MG PO TABS: 500.0000 mg | ORAL_TABLET | Freq: Two times a day (BID) | ORAL | Status: AC

## 2013-06-01 NOTE — Progress Notes (Signed)
   Subjective:    Patient ID: Brianna Griffin, female    DOB: 1985/10/30, 28 y.o.   MRN: [id number]  Sore Throat  This is a new problem. The current episode started 1 to 4 weeks ago. Associated symptoms include congestion.   Patient finished Cefzil on Sun pm but still has the sore throat. Worse in the morn , worse iat night  No fever, none since  Headache gone  Cough, sometimes wit deep beath   Review of Systems  HENT: Positive for congestion.    no current headache no chest pain no back pain no abdominal pain ROS otherwise negative     Objective:   Physical Exam  Alert hydration good trace erythematous some nasal congestion TM slight retraction neck supple lungs clear heart rare rhythm.      Assessment & Plan:  Impression persistent pharyngitis plan Biaxin twice a day 10 days. Symptomatic care discussed. Warning signs discussed. WSL

## 2013-08-10 ENCOUNTER — Ambulatory Visit (INDEPENDENT_AMBULATORY_CARE_PROVIDER_SITE_OTHER): Payer: Medicaid Other | Admitting: Adult Health

## 2013-08-10 ENCOUNTER — Encounter: Payer: Self-pay | Admitting: Adult Health

## 2013-08-10 VITALS — BP 110/74 | Ht 64.0 in | Wt 147.0 lb

## 2013-08-10 DIAGNOSIS — Z3200 Encounter for pregnancy test, result unknown: Secondary | ICD-10-CM

## 2013-08-10 DIAGNOSIS — Z3201 Encounter for pregnancy test, result positive: Secondary | ICD-10-CM

## 2013-08-12 LAB — POCT URINE PREGNANCY: Preg Test, Ur: POSITIVE

## 2013-08-24 ENCOUNTER — Telehealth: Payer: Self-pay | Admitting: *Deleted

## 2013-08-24 ENCOUNTER — Other Ambulatory Visit: Payer: Self-pay | Admitting: Obstetrics and Gynecology

## 2013-08-24 DIAGNOSIS — O200 Threatened abortion: Secondary | ICD-10-CM

## 2013-08-24 DIAGNOSIS — O3680X0 Pregnancy with inconclusive fetal viability, not applicable or unspecified: Secondary | ICD-10-CM

## 2013-08-24 NOTE — Telephone Encounter (Signed)
Pt states having a little brownish/red spotting, she states she did have sex on Sunday.  I explained to her that some spotting can be normal after sex or straining with BM.  I advised her to keep her appointment tomorrow for her new OB but if her bleeding increased, she passes any clots or has cramping to call us back, pt verbalized understanding.

## 2013-08-25 ENCOUNTER — Ambulatory Visit (INDEPENDENT_AMBULATORY_CARE_PROVIDER_SITE_OTHER): Payer: No Typology Code available for payment source

## 2013-08-25 ENCOUNTER — Other Ambulatory Visit: Payer: Self-pay | Admitting: Obstetrics and Gynecology

## 2013-08-25 DIAGNOSIS — O3680X0 Pregnancy with inconclusive fetal viability, not applicable or unspecified: Secondary | ICD-10-CM

## 2013-08-25 DIAGNOSIS — O200 Threatened abortion: Secondary | ICD-10-CM

## 2013-08-25 NOTE — Progress Notes (Signed)
U/S-transvaginal u/s performed, single IUP with +FCA noted, FHR-93BPM, CRL c/w 5+4wks EDD 04/23/2014, cx appears long and closed, bilateral adnexa appears WNL with C.L. Noted on Lt no free fluid noted within pelvis

## 2013-08-26 ENCOUNTER — Ambulatory Visit (INDEPENDENT_AMBULATORY_CARE_PROVIDER_SITE_OTHER): Payer: Medicaid Other | Admitting: Obstetrics and Gynecology

## 2013-08-26 ENCOUNTER — Other Ambulatory Visit: Payer: Self-pay | Admitting: Obstetrics and Gynecology

## 2013-08-26 ENCOUNTER — Telehealth: Payer: Self-pay

## 2013-08-26 VITALS — BP 124/70 | Wt 146.4 lb

## 2013-08-26 DIAGNOSIS — O200 Threatened abortion: Secondary | ICD-10-CM

## 2013-08-26 DIAGNOSIS — Z1389 Encounter for screening for other disorder: Secondary | ICD-10-CM

## 2013-08-26 DIAGNOSIS — Z331 Pregnant state, incidental: Secondary | ICD-10-CM

## 2013-08-26 LAB — POCT URINALYSIS DIPSTICK
Blood, UA: 3
Glucose, UA: NEGATIVE
Ketones, UA: NEGATIVE
LEUKOCYTES UA: NEGATIVE
NITRITE UA: NEGATIVE
PROTEIN UA: NEGATIVE

## 2013-08-26 NOTE — Telephone Encounter (Signed)
Pt states had ultrasound in our office yesterday, been having a brownish discharge now having vaginal bleeding, no cramping. Pt states is concerned. Pt to be here today at 1:30 pm to be evaluated.

## 2013-08-26 NOTE — Patient Instructions (Addendum)
Explained to pt the risk of 1st trimester bleeding. Will perform blood test monitoring every 48 hours. Will repeat US next week. Advised pt to rest for several days and advised pt to abstain from sexual activity for a week until the bleeding is finished.  Threatened Miscarriage Bleeding during the first 20 weeks of pregnancy is common. This is sometimes called a threatened miscarriage. This is a pregnancy that is threatening to end before the twentieth week of pregnancy. Often this bleeding stops with bed rest or decreased activities as suggested by your caregiver and the pregnancy continues without any more problems. You may be asked to not have sexual intercourse, have orgasms or use tampons until further notice. Sometimes a threatened miscarriage can progress to a complete or incomplete miscarriage. This may or may not require further treatment. Some miscarriages occur before a woman misses a menstrual period and knows she is pregnant. Miscarriages occur in 75 to 20% of all pregnancies and usually occur during the first 13 weeks of the pregnancy. The exact cause of a miscarriage is usually never known. A miscarriage is natures way of ending a pregnancy that is abnormal or would not make it to term. There are some things that may put you at risk to have a miscarriage, such as:  Hormone problems.  Infection of the uterus or cervix.  Chronic illness, diabetes for example, especially if it is not controlled.  Abnormal shaped uterus.  Fibroids in the uterus.  Incompetent cervix (the cervix is too weak to hold the baby).  Smoking.  Drinking too much alcohol. It's best not to drink any alcohol when you are pregnant.  Taking illegal drugs. TREATMENT  When a miscarriage becomes complete and all products of conception (all the tissue in the uterus) have been passed, often no treatment is needed. If you think you passed tissue, save it in a container and take it to your doctor for evaluation. If the  miscarriage is incomplete (parts of the fetus or placenta remain in the uterus), further treatment may be needed. The most common reason for further treatment is continued bleeding (hemorrhage) because pregnancy tissue did not pass out of the uterus. This often occurs if a miscarriage is incomplete. Tissue left behind may also become infected. Treatment usually is dilatation and curettage (the removal of the remaining products of pregnancy. This can be done by a simple sucking procedure (suction curettage) or a simple scraping of the inside of the uterus. This may be done in the hospital or in the caregiver's office. This is only done when your caregiver knows that there is no chance for the pregnancy to proceed to term. This is determined by physical examination, negative pregnancy test, falling pregnancy hormone count and/or, an ultrasound revealing a dead fetus. Miscarriages are often a very emotional time for prospective mothers and fathers. This is not you or your partners fault. It did not occur because of an inadequacy in you or your partner. Nearly all miscarriages occur because the pregnancy has started off wrongly. At least half of these pregnancies have a chromosomal abnormality. It is almost always not inherited. Others may have developmental problems with the fetus or placenta. This does not always show up even when the products miscarried are studied under the microscope. The miscarriage is nearly always not your fault and it is not likely that you could have prevented it from happening. If you are having emotional and grieving problems, talk to your health care provider and even seek counseling, if  necessary, before getting pregnant again. You can begin trying for another pregnancy as soon as your caregiver says it is OK. HOME CARE INSTRUCTIONS   Your caregiver may order bed rest depending on how much bleeding and cramping you are having. You may be limited to only getting up to go to the  bathroom. You may be allowed to continue light activity. You may need to make arrangements for the care of your other children and for any other responsibilities.  Keep track of the number of pads you use each day, how often you have to change pads and how saturated (soaked) they are. Record this information.  DO NOT USE TAMPONS. Do not douche, have sexual intercourse or orgasms until approved by your caregiver.  You may receive a follow up appointment for re-evaluation of your pregnancy and a repeat blood test. Re-evaluation often occurs after 2 days and again in 4 to 6 weeks. It is very important that you follow-up in the recommended time period.  If you are Rh negative and the father is Rh positive or you do not know the fathers' blood type, you may receive a shot (Rh immune globulin) to help prevent abnormal antibodies that can develop and affect the baby in any future pregnancies. SEEK IMMEDIATE MEDICAL CARE IF:  You have severe cramps in your stomach, back, or abdomen.  You have a sudden onset of severe pain in the lower part of your abdomen.  You develop chills.  You run an unexplained temperature of 101 F (38.3 C) or higher.  You pass large clots or tissue. Save any tissue for your caregiver to inspect.  Your bleeding increases or you become light-headed, weak, or have fainting episodes.  You have a gush of fluid from your vagina.  You pass out. This could mean you have a tubal (ectopic) pregnancy. Document Released: 05/06/2005 Document Revised: 07/29/2011 Document Reviewed: 12/21/2007 [facility] Patient Information 2014 [place].

## 2013-08-26 NOTE — Progress Notes (Signed)
She is experiencing sudden-onset bleeding which she characterizes as "like a period." She had an OB appointment yesterday; the [place] dated her [redacted]w[redacted]d. She reports she calculated her pregnancy as a week later. She denies current abdominal cramping. No tissue visualized.  Performed pelvic exam with chaperone present.  Pt believes she is B+ blood type.  Pt takes prenatal vitamins. Pt last used Depo-Provera until 02/2013; six months ago.

## 2013-08-27 LAB — ABO AND RH: RH TYPE: POSITIVE

## 2013-08-27 LAB — HCG, QUANTITATIVE, PREGNANCY: hCG, Beta Chain, Quant, S: 2347.2 m[IU]/mL

## 2013-08-30 ENCOUNTER — Ambulatory Visit (INDEPENDENT_AMBULATORY_CARE_PROVIDER_SITE_OTHER): Payer: No Typology Code available for payment source

## 2013-08-30 ENCOUNTER — Encounter: Payer: Self-pay | Admitting: Women's Health

## 2013-08-30 ENCOUNTER — Ambulatory Visit (INDEPENDENT_AMBULATORY_CARE_PROVIDER_SITE_OTHER): Payer: Medicaid Other | Admitting: Women's Health

## 2013-08-30 ENCOUNTER — Other Ambulatory Visit: Payer: Self-pay | Admitting: Obstetrics and Gynecology

## 2013-08-30 ENCOUNTER — Other Ambulatory Visit: Payer: No Typology Code available for payment source

## 2013-08-30 VITALS — BP 120/62 | Ht 64.5 in | Wt 145.0 lb

## 2013-08-30 DIAGNOSIS — O021 Missed abortion: Secondary | ICD-10-CM

## 2013-08-30 DIAGNOSIS — O200 Threatened abortion: Secondary | ICD-10-CM

## 2013-08-30 DIAGNOSIS — O039 Complete or unspecified spontaneous abortion without complication: Secondary | ICD-10-CM | POA: Insufficient documentation

## 2013-08-30 NOTE — Patient Instructions (Signed)
Miscarriage A miscarriage is the sudden loss of an unborn baby (fetus) before the 20th week of pregnancy. Most miscarriages happen in the first 3 months of pregnancy. Sometimes, it happens before a woman even knows she is pregnant. A miscarriage is also called a "spontaneous miscarriage" or "early pregnancy loss." Having a miscarriage can be an emotional experience. Talk with your caregiver about any questions you may have about miscarrying, the grieving process, and your future pregnancy plans. CAUSES   Problems with the fetal chromosomes that make it impossible for the baby to develop normally. Problems with the baby's genes or chromosomes are most often the result of errors that occur, by chance, as the embryo divides and grows. The problems are not inherited from the parents.  Infection of the cervix or uterus.   Hormone problems.   Problems with the cervix, such as having an incompetent cervix. This is when the tissue in the cervix is not strong enough to hold the pregnancy.   Problems with the uterus, such as an abnormally shaped uterus, uterine fibroids, or congenital abnormalities.   Certain medical conditions.   Smoking, drinking alcohol, or taking illegal drugs.   Trauma.  Often, the cause of a miscarriage is unknown.  SYMPTOMS   Vaginal bleeding or spotting, with or without cramps or pain.  Pain or cramping in the abdomen or lower back.  Passing fluid, tissue, or blood clots from the vagina. DIAGNOSIS  Your caregiver will perform a physical exam. You may also have an ultrasound to confirm the miscarriage. Blood or urine tests may also be ordered. TREATMENT   Sometimes, treatment is not necessary if you naturally pass all the fetal tissue that was in the uterus. If some of the fetus or placenta remains in the body (incomplete miscarriage), tissue left behind may become infected and must be removed. Usually, a dilation and curettage (D and C) procedure is performed.  During a D and C procedure, the cervix is widened (dilated) and any remaining fetal or placental tissue is gently removed from the uterus.  Antibiotic medicines are prescribed if there is an infection. Other medicines may be given to reduce the size of the uterus (contract) if there is a lot of bleeding.  If you have Rh negative blood and your baby was Rh positive, you will need a Rh immunoglobulin shot. This shot will protect any future baby from having Rh blood problems in future pregnancies. HOME CARE INSTRUCTIONS   Your caregiver may order bed rest or may allow you to continue light activity. Resume activity as directed by your caregiver.  Have someone help with home and family responsibilities during this time.   Keep track of the number of sanitary pads you use each day and how soaked (saturated) they are. Write down this information.   Do not use tampons. Do not douche or have sexual intercourse until approved by your caregiver.   Only take over-the-counter or prescription medicines for pain or discomfort as directed by your caregiver.   Do not take aspirin. Aspirin can cause bleeding.   Keep all follow-up appointments with your caregiver.   If you or your partner have problems with grieving, talk to your caregiver or seek counseling to help cope with the pregnancy loss. Allow enough time to grieve before trying to get pregnant again.  SEEK IMMEDIATE MEDICAL CARE IF:   You have severe cramps or pain in your back or abdomen.  You have a fever.  You pass large blood clots (walnut-sized   or larger) ortissue from your vagina. Save any tissue for your caregiver to inspect.   Your bleeding increases.   You have a thick, bad-smelling vaginal discharge.  You become lightheaded, weak, or you faint.   You have chills.  MAKE SURE YOU:  Understand these instructions.  Will watch your condition.  Will get help right away if you are not doing well or get  worse. Document Released: 10/30/2000 Document Revised: 08/31/2012 Document Reviewed: 06/25/2011 [employer] Patient Information 2014 [employer].  

## 2013-08-30 NOTE — Progress Notes (Signed)
Patient ID: Brianna Griffin, female   DOB: 1986-03-21, 28 y.o.   MRN: [id number]   [facility] [facility] Visit  Patient name: Brianna Griffin MRN [id number]  Date of birth: 1986/02/04  CC & HPI:  Brianna Griffin is a 28 y.o. [id number] Caucasian female at [redacted]w[redacted]d by last weeks confirmatory u/s, presenting today for report of heavy bleeding and slight cramping over weekend, so she had a f/u u/s today. Last weeks u/s on 4/8 revealed SIUP w/ CRL c/w 5.4wk fetus w/ FHR 93, she had brownish d/c w/ streak of blood the day before.  BHCG on 4/9 2,347.2, ABO/RH B+. Today's u/s revealed SIUP w/ CRL c/w 6.0wk fetus, now w/o FCA and GS w/in LUS, cx appears closed.   Pertinent History Reviewed:  Medical & Surgical Hx:   Past Medical History  Diagnosis Date  . Anxiety   . Depression   . Asthma   . Migraine headache   . IBS (irritable bowel syndrome)   . Allergy    History reviewed. No pertinent past surgical history. Medications: Reviewed & Updated - see associated section Social History: Reviewed -  reports that she has never smoked. She has never used smokeless tobacco.  Objective Findings:  Vitals: BP 120/62  Ht 5' 4.5" (1.638 m)  Wt 145 lb (65.772 kg)  BMI 24.51 kg/m2  LMP 07/10/2013  Physical Examination: General appearance - alert, oriented, appropriately saddened  Today's u/s: U/S(6+2wks)-single IUP with NO FCA NOTED today, GS appears to be within lower uterine segment, cx appears closed, bilateral adnexa appears WNL, CRL c/w 6+0wks  Assessment & Plan:  A:   [redacted]w[redacted]d SIUP, miscarriage  B+ P:  Repeat hcg today  Discussed options of expectant management vs. Cytotec, prefers expectant management for now  F/U 1wk for repeat hcg and visit   Pt & husband want to try to conceive again soon, recommended 38mths from now  To continue PNV    [place], [facility] 08/30/2013 9:31 AM

## 2013-08-30 NOTE — Progress Notes (Signed)
U/S(6+2wks)-single IUP with NO FCA NOTED today, GS appears to be within lower uterine segment, cx appears closed, bilateral adnexa appears WNL, CRL c/w 6+0wks

## 2013-08-31 ENCOUNTER — Encounter: Payer: Self-pay | Admitting: Women's Health

## 2013-08-31 ENCOUNTER — Telehealth: Payer: Self-pay | Admitting: Women's Health

## 2013-08-31 LAB — HCG, QUANTITATIVE, PREGNANCY: hCG, Beta Chain, Quant, S: 2113.4 m[IU]/mL

## 2013-08-31 NOTE — Telephone Encounter (Signed)
Note to excuse pt from work on 04/10, 04/13, 08/31/2013 per Knute Neu, CNM left at front desk for pt to pick up.

## 2013-08-31 NOTE — Telephone Encounter (Signed)
Pt requesting note to excuse from work for 04/10, 04/13, 04/14 due to recent miscarriage.

## 2013-09-02 ENCOUNTER — Encounter: Payer: Self-pay | Admitting: Women's Health

## 2013-09-02 ENCOUNTER — Telehealth: Payer: Self-pay | Admitting: *Deleted

## 2013-09-02 NOTE — Telephone Encounter (Signed)
Letter left at front desk excusing pt from work on 04/16 and 09/03/2013 per Knute Neu, CNM.

## 2013-09-06 ENCOUNTER — Encounter: Payer: Self-pay | Admitting: Women's Health

## 2013-09-06 ENCOUNTER — Ambulatory Visit (INDEPENDENT_AMBULATORY_CARE_PROVIDER_SITE_OTHER): Payer: Medicaid Other | Admitting: Women's Health

## 2013-09-06 VITALS — BP 116/66 | Ht 64.0 in | Wt 146.5 lb

## 2013-09-06 DIAGNOSIS — O039 Complete or unspecified spontaneous abortion without complication: Secondary | ICD-10-CM

## 2013-09-06 NOTE — Progress Notes (Signed)
Patient ID: JAZSMIN COUSE, female   DOB: 1986/03/12, 28 y.o.   MRN: [id number]   [facility] [place] [facility] Visit  Patient name: Brianna Griffin MRN [id number]  Date of birth: 11/08/1985  CC & HPI:  Brianna Griffin is a 28 y.o. Caucasian female presenting today for f/u after expectant management x 1 wk for miscarriage. States bleeding has slowed to light brown w/ occ red, never really cramped a lot, but feels like she passed some tissue, but not sure. Does want to try to conceive again soon, knows recommended to wait at least 3 months, doesn't want to begin hormonal contraception- will use condoms for now.   Pertinent History Reviewed:  Medical & Surgical Hx:   Past Medical History  Diagnosis Date  . Anxiety   . Depression   . Asthma   . Migraine headache   . IBS (irritable bowel syndrome)   . Allergy    History reviewed. No pertinent past surgical history. Medications: Reviewed & Updated - see associated section Social History: Reviewed -  reports that she has never smoked. She has never used smokeless tobacco.  Objective Findings:  Vitals: BP 116/66  Ht 5\' 4"  (1.626 m)  Wt 146 lb 8 oz (66.452 kg)  BMI 25.13 kg/m2  LMP 07/10/2013  Breastfeeding? Unknown  Physical Examination: General appearance - alert, well appearing, and in no distress  No results found for this or any previous visit (from the past 24 hour(s)).   Assessment & Plan:  A:   F/U 6wk miscarriage  Unsure if has passed all POC P:  bhcg today   F/U asap for pelvic u/s and f/u visit to discuss results  Condoms for contraception for now  Continue pnv   [place], [facility] 09/06/2013 9:39 AM

## 2013-09-07 LAB — HCG, QUANTITATIVE, PREGNANCY: hCG, Beta Chain, Quant, S: 1164.6 m[IU]/mL

## 2013-09-09 ENCOUNTER — Encounter: Payer: Self-pay | Admitting: Adult Health

## 2013-09-09 ENCOUNTER — Ambulatory Visit: Payer: Medicaid Other | Admitting: Adult Health

## 2013-09-09 ENCOUNTER — Ambulatory Visit (INDEPENDENT_AMBULATORY_CARE_PROVIDER_SITE_OTHER): Payer: Medicaid Other | Admitting: Adult Health

## 2013-09-09 ENCOUNTER — Other Ambulatory Visit: Payer: Self-pay | Admitting: Women's Health

## 2013-09-09 ENCOUNTER — Ambulatory Visit (INDEPENDENT_AMBULATORY_CARE_PROVIDER_SITE_OTHER): Payer: No Typology Code available for payment source

## 2013-09-09 VITALS — BP 100/60 | Ht 64.0 in | Wt 145.0 lb

## 2013-09-09 DIAGNOSIS — O021 Missed abortion: Secondary | ICD-10-CM

## 2013-09-09 DIAGNOSIS — O039 Complete or unspecified spontaneous abortion without complication: Secondary | ICD-10-CM

## 2013-09-09 HISTORY — DX: Complete or unspecified spontaneous abortion without complication: O03.9

## 2013-09-09 NOTE — Patient Instructions (Signed)
Check QHCG Follow up by phone  no sex

## 2013-09-09 NOTE — Progress Notes (Signed)
Subjective:     Patient ID: Brianna Griffin, female   DOB: Mar 19, 1986, 28 y.o.   MRN: [id number]  HPI Brianna Griffin is a 28 year old white female in for [place] to assess miscarriage progress.  Review of Systems See HPI Reviewed past medical,surgical, social and family history. Reviewed medications and allergies.     Objective:   Physical Exam BP 100/60  Ht 5\' 4"  (1.626 m)  Wt 145 lb (65.772 kg)  BMI 24.88 kg/m2  LMP 07/10/2013  Breastfeeding? NoReviewed [place] with pt,no FCA still has GS and is spotting brown, discussed could use cytotec or wait and see what her body does, will wait for now, but is going to discuss with partner, pt aware QHCG dropping.Blood type B+.    Assessment:     Miscarriage    Plan:     Check QHCG next week Call if decides wants cytotec Follow up by phone No sex   review handout by Tilden Fossa on miscarriage

## 2013-09-14 ENCOUNTER — Telehealth: Payer: Self-pay | Admitting: Women's Health

## 2013-09-14 MED ORDER — OXYCODONE-ACETAMINOPHEN 5-325 MG PO TABS: 1.0000 | ORAL_TABLET | ORAL | Status: DC | PRN

## 2013-09-14 MED ORDER — MISOPROSTOL 200 MCG PO TABS: 800.0000 ug | ORAL_TABLET | Freq: Once | ORAL | Status: DC

## 2013-09-14 NOTE — Telephone Encounter (Signed)
Called to check on pt w/ missed ab that has been doing expectant management x 2wks. No increased craming/bleeding/passage of tissue. No fever/chills. Wants to wait until this weekend to start cytotec b/c she doesn't want to miss any more work. Rx cytotec 81mcg po x 1 w/ 1 RF if needed 48hrs after 1st dose. Also printed rx for percocet 5/325 #15 if needed. Will come by office to pick up. Reviewed s/s infection, reasons to seek medical care.  [name], CNM, [facility] 09/14/2013 2:46 PM

## 2013-09-15 ENCOUNTER — Encounter: Payer: Self-pay | Admitting: Women's Health

## 2013-09-15 ENCOUNTER — Telehealth: Payer: Self-pay | Admitting: Women's Health

## 2013-09-16 ENCOUNTER — Telehealth: Payer: Self-pay | Admitting: *Deleted

## 2013-09-16 ENCOUNTER — Encounter: Payer: Self-pay | Admitting: Women's Health

## 2013-09-16 NOTE — Telephone Encounter (Signed)
Pt states had sent message to Knute Neu, CNM thru [employer] and has not received a response. Informed pt Maudie Mercury out of office today. Pt states did she need to go ahead and take the second dose of Cytotec, 1st dose was taken yesterday at 5 pm, had past small amount of blood and tissue. Per Dr. Elonda Husky ok for pt to go ahead and take the second dose. Pt verbalized understanding.

## 2013-09-17 ENCOUNTER — Encounter: Payer: Self-pay | Admitting: Women's Health

## 2013-09-17 ENCOUNTER — Other Ambulatory Visit: Payer: Self-pay | Admitting: Adult Health

## 2013-09-17 ENCOUNTER — Telehealth: Payer: Self-pay | Admitting: Obstetrics and Gynecology

## 2013-09-17 NOTE — Telephone Encounter (Signed)
Pt states did not take cytotec last night, continues to pass tissue. Pt would like to discuss with Knute Neu. Informed Maudie Mercury out of office until Monday. Pt states did not have the [facility] done as scheduled encouraged pt to have [facility] done will route message to [place] again. Will f/u on Monday.

## 2013-09-18 LAB — HCG, QUANTITATIVE, PREGNANCY: hCG, Beta Chain, Quant, S: 24.8 m[IU]/mL

## 2013-09-20 ENCOUNTER — Telehealth: Payer: Self-pay | Admitting: Women's Health

## 2013-09-20 MED ORDER — NORETHIN-ETH ESTRAD-FE BIPHAS 1 MG-10 MCG / 10 MCG PO TABS: 1.0000 | ORAL_TABLET | Freq: Every day | ORAL | Status: DC

## 2013-09-20 NOTE — Telephone Encounter (Signed)
Returned pt's call. States she had dime-sized tissue passed w/ 1st round of cytotec, so did 2nd round on Friday and has had much more bleeding w/ this round.  HCG decreased from 1,164 (4/20) to 24.8 (5/1). Wants to start COCs until ready to try again for pregnancy in 90mths. Does not smoke, no h/o HTN, DVT/PE, CVA, MI, or migraines w/ aura. Rx'd lo loestrin. Advised to continue pnv. Will recheck hcg in 2wks to make sure it is continuing to decrease.  [name], CNM, [facility] 09/20/2013 1:57 PM

## 2013-09-26 ENCOUNTER — Encounter: Payer: Self-pay | Admitting: Women's Health

## 2013-10-21 ENCOUNTER — Encounter: Payer: Self-pay | Admitting: Family Medicine

## 2013-10-21 ENCOUNTER — Ambulatory Visit (INDEPENDENT_AMBULATORY_CARE_PROVIDER_SITE_OTHER): Payer: No Typology Code available for payment source | Admitting: Nurse Practitioner

## 2013-10-21 VITALS — BP 110/70 | Temp 98.9°F | Ht 64.0 in | Wt 147.0 lb

## 2013-10-21 DIAGNOSIS — R300 Dysuria: Secondary | ICD-10-CM

## 2013-10-21 LAB — POCT UA - MICROSCOPIC ONLY
BACTERIA, U MICROSCOPIC: NEGATIVE
RBC, URINE, MICROSCOPIC: NEGATIVE

## 2013-10-21 LAB — POCT URINALYSIS DIPSTICK
PH UA: 5
Spec Grav, UA: <=1.005

## 2013-10-21 MED ORDER — SULFAMETHOXAZOLE-TMP DS 800-160 MG PO TABS: 1.0000 | ORAL_TABLET | Freq: Two times a day (BID) | ORAL | Status: DC

## 2013-10-24 LAB — URINE CULTURE: Colony Count: >=100000

## 2013-10-25 ENCOUNTER — Encounter: Payer: Self-pay | Admitting: Women's Health

## 2013-10-26 ENCOUNTER — Encounter: Payer: Self-pay | Admitting: Nurse Practitioner

## 2013-10-26 NOTE — Progress Notes (Signed)
Subjective:  Presents for complaints of dysuria with slight urgency and frequency that began 2 days ago. No fever. No nausea vomiting. Mild pelvic cramping this morning. Had a miscarriage about one month ago, he followed by her gynecologist. Currently on birth control pills for now. No vaginal bleeding. Has increased her fluid intake. Same sexual partner. No vaginal discharge. No back or flank pain. Last UTI August 2014.  Objective:   BP 110/70  Temp(Src) 98.9 F (37.2 C) (Oral)  Ht 5\' 4"  (1.626 m)  Wt 147 lb (66.679 kg)  BMI 25.22 kg/m2  NAD. Alert, oriented. Lungs clear. Heart regular rhythm. No CVA tenderness. Abdomen soft nondistended nontender. Urine microscopic negative but note urine very dilute.  Assessment:Dysuria - Plan: POCT urinalysis dipstick, POCT UA - Microscopic Only, Urine culture  Plan: Meds ordered this encounter  Medications  . sulfamethoxazole-trimethoprim (BACTRIM DS) 800-160 MG per tablet    Sig: Take 1 tablet by mouth 2 (two) times daily.    Dispense:  14 tablet    Refill:  0    Order Specific Question:  Supervising Provider    Answer:  Mikey Kirschner [[id number]]   AZO as directed for the next 48 hours then discontinue. Warning signs reviewed. Call back in 48-72 hours if no improvement, sooner if worse.

## 2013-11-08 ENCOUNTER — Other Ambulatory Visit: Payer: Self-pay | Admitting: Nurse Practitioner

## 2013-11-08 ENCOUNTER — Telehealth: Payer: Self-pay | Admitting: Family Medicine

## 2013-11-08 MED ORDER — SULFAMETHOXAZOLE-TMP DS 800-160 MG PO TABS: 1.0000 | ORAL_TABLET | Freq: Two times a day (BID) | ORAL | Status: DC

## 2013-11-08 NOTE — Telephone Encounter (Signed)
Med sent and patient notified 

## 2013-11-08 NOTE — Telephone Encounter (Signed)
I called in refill of Bactrim. Please advise not to take if she is pregnant. I know she is back on birth control just for a few months. According to her urine culture, this should work against that particular bacteria. Call back if persists.

## 2013-11-08 NOTE — Telephone Encounter (Signed)
Patient was seen 6/4 and given antibotic. She finished but symptoms have come back can you call in another round into [facility].

## 2013-11-10 ENCOUNTER — Encounter: Payer: Self-pay | Admitting: Nurse Practitioner

## 2013-11-11 ENCOUNTER — Other Ambulatory Visit: Payer: Self-pay | Admitting: Nurse Practitioner

## 2013-11-11 MED ORDER — CIPROFLOXACIN HCL 500 MG PO TABS: 500.0000 mg | ORAL_TABLET | Freq: Two times a day (BID) | ORAL | Status: DC

## 2014-01-06 ENCOUNTER — Encounter: Payer: Self-pay | Admitting: Nurse Practitioner

## 2014-01-06 ENCOUNTER — Ambulatory Visit (INDEPENDENT_AMBULATORY_CARE_PROVIDER_SITE_OTHER): Payer: No Typology Code available for payment source | Admitting: Nurse Practitioner

## 2014-01-06 VITALS — BP 116/70 | Ht 64.0 in | Wt 140.0 lb

## 2014-01-06 DIAGNOSIS — K5900 Constipation, unspecified: Secondary | ICD-10-CM

## 2014-01-06 DIAGNOSIS — K589 Irritable bowel syndrome without diarrhea: Secondary | ICD-10-CM

## 2014-01-06 NOTE — Patient Instructions (Signed)
Dulcolax 2 as directed Magnesium citrate Fleets enema Pint of warm milk mixed with a cup of dark molasses given by enema bag

## 2014-01-12 ENCOUNTER — Encounter: Payer: Self-pay | Admitting: Nurse Practitioner

## 2014-01-12 NOTE — Progress Notes (Signed)
Subjective:  Presents for recheck on her IBS. Usually has alternating cycles of diarrhea and constipation. Now having constipation for over a week. Adequate fluids and fiber in her diet. Slight relief with Dulcolax and MiraLAX. No blood in her stool. No fever. Slight abdominal cramping but no severe pain.  Objective:   BP 116/70  Ht 5\' 4"  (1.626 m)  Wt 140 lb (63.504 kg)  BMI 24.02 kg/m2  LMP 12/20/2013 NAD. Alert, oriented. Lungs clear. Heart regular rate rhythm. Abdomen soft nondistended without obvious masses, active bowel sounds x4.  Assessment:  Problem List Items Addressed This Visit     Digestive   CONSTIPATION - Primary   IBS     Plan: Dulcolax 2 as directed Magnesium citrate Fleets enema Pint of warm milk mixed with a cup of dark molasses given by enema bag Choose one of the above to help with constipation. Call back if no relief from one of these methods. Warning signs reviewed.

## 2014-01-28 ENCOUNTER — Ambulatory Visit (INDEPENDENT_AMBULATORY_CARE_PROVIDER_SITE_OTHER): Payer: No Typology Code available for payment source | Admitting: Family Medicine

## 2014-01-28 ENCOUNTER — Encounter: Payer: Self-pay | Admitting: Family Medicine

## 2014-01-28 VITALS — Temp 98.2°F | Ht 64.0 in | Wt 140.4 lb

## 2014-01-28 DIAGNOSIS — N3000 Acute cystitis without hematuria: Secondary | ICD-10-CM

## 2014-01-28 DIAGNOSIS — R300 Dysuria: Secondary | ICD-10-CM

## 2014-01-28 LAB — POCT URINALYSIS DIPSTICK
Spec Grav, UA: 1.015
pH, UA: 6

## 2014-01-28 MED ORDER — CEFPROZIL 500 MG PO TABS: 500.0000 mg | ORAL_TABLET | Freq: Two times a day (BID) | ORAL | Status: DC

## 2014-01-28 NOTE — Telephone Encounter (Signed)
Sorry, we do not call in antibiotics without being seen. Please call our office and schedule an office visit for evaluation.  Thank You

## 2014-01-28 NOTE — Progress Notes (Signed)
   Subjective:    Patient ID: Brianna Griffin, female    DOB: 09-30-1985, 28 y.o.   MRN: [id number]  Dysuria  This is a new problem. The current episode started in the past 7 days.   started a few days ago somewhat progressive over the past couple days burns urinary frequency denies hematuria denies fever or flank pain    Review of Systems  Genitourinary: Positive for dysuria.   denies fever vomiting diarrhea     Objective:   Physical Exam Lungs clear heart is regular flanks nontender extremities no edema  UA with wbc's     Assessment & Plan:  UTI antibiotics prescribed warning signs discussed. Use antibiotics safe with possible pregnancy. Patient trying to get pregnant. Culture pending Frequent UTIs were discussed. If this becomes more frequent may need suppression antibiotics. Doubtful that there is any type of structural issues patient did have frequent urinary tract infections when she was 28 years of age but did not have them as a young child has not had them since

## 2014-02-01 LAB — URINE CULTURE: Colony Count: >=100000

## 2014-02-15 ENCOUNTER — Encounter: Payer: Self-pay | Admitting: Adult Health

## 2014-02-15 ENCOUNTER — Ambulatory Visit (INDEPENDENT_AMBULATORY_CARE_PROVIDER_SITE_OTHER): Payer: No Typology Code available for payment source | Admitting: Adult Health

## 2014-02-15 DIAGNOSIS — Z3201 Encounter for pregnancy test, result positive: Secondary | ICD-10-CM

## 2014-02-15 LAB — POCT URINE PREGNANCY: Preg Test, Ur: POSITIVE

## 2014-02-15 NOTE — Progress Notes (Signed)
Pt here for pregnancy test. Positive result. Pt reports no spotting or cramping. Advised both can be normal in early pregnancy, that's just everything getting settled into place. If she notices either, push fluids, take it easy, and call office. Pt voiced understanding. [place]

## 2014-03-02 ENCOUNTER — Ambulatory Visit (INDEPENDENT_AMBULATORY_CARE_PROVIDER_SITE_OTHER): Payer: No Typology Code available for payment source | Admitting: Obstetrics & Gynecology

## 2014-03-02 ENCOUNTER — Telehealth: Payer: Self-pay | Admitting: Adult Health

## 2014-03-02 ENCOUNTER — Encounter: Payer: Self-pay | Admitting: Obstetrics & Gynecology

## 2014-03-02 VITALS — BP 110/60 | Wt 139.0 lb

## 2014-03-02 DIAGNOSIS — O200 Threatened abortion: Secondary | ICD-10-CM | POA: Insufficient documentation

## 2014-03-02 NOTE — Telephone Encounter (Signed)
Pt was seen in office today due to spotting during pregnancy. [place]

## 2014-03-02 NOTE — Telephone Encounter (Signed)
Left message x 1. JSY 

## 2014-03-02 NOTE — Progress Notes (Signed)
Patient ID: Brianna Griffin, female   DOB: December 07, 1985, 28 y.o.   MRN: [id number] Patient's last menstrual period was 01/17/2014. Started spotted today, brown, no cramping  Will do a quantitative HCG and serum progesterone today Will call with results tomorrow

## 2014-03-03 ENCOUNTER — Telehealth: Payer: Self-pay | Admitting: Obstetrics & Gynecology

## 2014-03-03 ENCOUNTER — Other Ambulatory Visit: Payer: Self-pay | Admitting: Obstetrics & Gynecology

## 2014-03-03 DIAGNOSIS — O200 Threatened abortion: Secondary | ICD-10-CM

## 2014-03-03 LAB — HCG, QUANTITATIVE, PREGNANCY: hCG, Beta Chain, Quant, S: 3727.4 m[IU]/mL

## 2014-03-03 LAB — PROGESTERONE: Progesterone: 5.2 ng/mL

## 2014-03-03 MED ORDER — PROGESTERONE MICRONIZED 200 MG PO CAPS: ORAL_CAPSULE | ORAL | Status: DC

## 2014-03-03 NOTE — Addendum Note (Signed)
Addended by: Florian Buff on: 03/03/2014 09:46 AM   Modules accepted: Orders

## 2014-03-03 NOTE — Telephone Encounter (Signed)
Pt informed RX for Prometrium sent to [facility].

## 2014-03-04 ENCOUNTER — Encounter: Payer: Self-pay | Admitting: Adult Health

## 2014-03-04 ENCOUNTER — Other Ambulatory Visit: Payer: Self-pay | Admitting: Obstetrics & Gynecology

## 2014-03-04 ENCOUNTER — Ambulatory Visit (INDEPENDENT_AMBULATORY_CARE_PROVIDER_SITE_OTHER): Payer: No Typology Code available for payment source | Admitting: Adult Health

## 2014-03-04 ENCOUNTER — Ambulatory Visit (INDEPENDENT_AMBULATORY_CARE_PROVIDER_SITE_OTHER): Payer: No Typology Code available for payment source

## 2014-03-04 ENCOUNTER — Other Ambulatory Visit (HOSPITAL_COMMUNITY)
Admission: RE | Admit: 2014-03-04 | Discharge: 2014-03-04 | Disposition: A | Discharge status: Home / Self Care | Payer: Medicaid Other | Source: Ambulatory Visit | Attending: Adult Health | Admitting: Adult Health

## 2014-03-04 VITALS — BP 140/78 | Wt 140.5 lb

## 2014-03-04 DIAGNOSIS — Z331 Pregnant state, incidental: Secondary | ICD-10-CM

## 2014-03-04 DIAGNOSIS — Z113 Encounter for screening for infections with a predominantly sexual mode of transmission: Secondary | ICD-10-CM | POA: Insufficient documentation

## 2014-03-04 DIAGNOSIS — Z1389 Encounter for screening for other disorder: Secondary | ICD-10-CM

## 2014-03-04 DIAGNOSIS — O200 Threatened abortion: Secondary | ICD-10-CM

## 2014-03-04 DIAGNOSIS — Z01419 Encounter for gynecological examination (general) (routine) without abnormal findings: Secondary | ICD-10-CM | POA: Diagnosis present

## 2014-03-04 DIAGNOSIS — Z3491 Encounter for supervision of normal pregnancy, unspecified, first trimester: Secondary | ICD-10-CM

## 2014-03-04 DIAGNOSIS — Z1329 Encounter for screening for other suspected endocrine disorder: Secondary | ICD-10-CM

## 2014-03-04 DIAGNOSIS — Z0184 Encounter for antibody response examination: Secondary | ICD-10-CM

## 2014-03-04 DIAGNOSIS — Z3481 Encounter for supervision of other normal pregnancy, first trimester: Secondary | ICD-10-CM

## 2014-03-04 DIAGNOSIS — Z114 Encounter for screening for human immunodeficiency virus [HIV]: Secondary | ICD-10-CM

## 2014-03-04 DIAGNOSIS — Z0283 Encounter for blood-alcohol and blood-drug test: Secondary | ICD-10-CM

## 2014-03-04 DIAGNOSIS — Z124 Encounter for screening for malignant neoplasm of cervix: Secondary | ICD-10-CM

## 2014-03-04 HISTORY — DX: Encounter for supervision of normal pregnancy, unspecified, first trimester: Z34.91

## 2014-03-04 LAB — POCT URINALYSIS DIPSTICK
GLUCOSE UA: NEGATIVE
Ketones, UA: NEGATIVE
Leukocytes, UA: NEGATIVE
NITRITE UA: NEGATIVE
Protein, UA: NEGATIVE
RBC UA: 3

## 2014-03-04 LAB — CBC
HCT: 37.3 % (ref 36.0–46.0)
Hemoglobin: 13.2 g/dL (ref 12.0–15.0)
MCH: 31.4 pg (ref 26.0–34.0)
MCHC: 35.4 g/dL (ref 30.0–36.0)
MCV: 88.8 fL (ref 78.0–100.0)
PLATELETS: 211 10*3/uL (ref 150–400)
RBC: 4.2 MIL/uL (ref 3.87–5.11)
RDW: 13 % (ref 11.5–15.5)
WBC: 7.5 10*3/uL (ref 4.0–10.5)

## 2014-03-04 NOTE — Progress Notes (Signed)
Subjective:  Brianna Griffin is a 28 y.o. G52P1011 Caucasian female at [redacted]w[redacted]d by LMP  being seen today for her first obstetrical visit.  Her obstetrical history is significant for recent miscariage and low progesterone level on prometrium..  Pregnancy history fully reviewed.  Patient reports nausea. Denies vb, cramping, uti s/s, or vulvovaginal itching/irritation.Has brown discharge.  BP 140/78  Wt 140 lb 8 oz (63.73 kg)  LMP 01/17/2014  HISTORY: OB History  Gravida Para Term Preterm AB SAB TAB Ectopic Multiple Living  3 1 1  1 1    1     # Outcome Date GA Lbr Len/2nd Weight Sex Delivery Anes PTL Lv  3 CUR           2 SAB 08/2013 [redacted]w[redacted]d         1 TRM 2010 [redacted]w[redacted]d  5 lb 15 oz (2.693 kg) F SVD   Y     Past Medical History  Diagnosis Date  . Anxiety   . Depression   . Asthma   . Migraine headache   . IBS (irritable bowel syndrome)   . Allergy   . Miscarriage 09/09/2013  . Supervision of normal pregnancy in first trimester 03/04/2014   History reviewed. No pertinent past surgical history. Family History  Problem Relation Age of Onset  . Diabetes Maternal Grandmother   . Hypertension Maternal Grandmother   . Cancer Maternal Grandfather     bladder and liver    Exam   System:     General: Well developed & nourished, no acute distress   Skin: Warm & dry, normal coloration and turgor, no rashes   Neurologic: Alert & oriented, normal mood   Cardiovascular: Regular rate & rhythm   Respiratory: Effort & rate normal, LCTAB, acyanotic   Abdomen: Soft, non tender   Extremities: normal strength, tone                               Thyroid noraml Pelvic Exam:    Perineum: Normal perineum   Vulva: Normal, no lesions   Vagina:  Normal mucosa, brown discharge   Cervix: Normal, bulbous, appears closed everted at os   Uterus: Normal size/shape/contour for GA   Thin prep pap smear with GC/CHL FHR: 94 via [place]   Assessment:   Pregnancy: G3P1011 Patient Active Problem List   Diagnosis  Date Noted  . Supervision of normal pregnancy in first trimester 03/04/2014  . Threatened miscarriage in early pregnancy 03/02/2014  . Miscarriage 09/09/2013  . DYSPEPSIA&OTHER [facility] DISORDERS FUNCTION STOMACH 10/30/2009  . IBS 10/30/2009  . ANXIETY 10/26/2009  . ASTHMA 10/26/2009  . CONSTIPATION 10/26/2009    [redacted]w[redacted]d G3P1011 New OB visit     Plan:  Initial labs drawn Continue prenatal vitamins Problem list reviewed and updated Reviewed n/v relief measures and warning s/s to report Reviewed recommended weight gain based on pre-gravid BMI Encouraged well-balanced diet Genetic Screening discussed Integrated Screen: declined Cystic fibrosis screening discussed declined Ultrasound discussed; fetal survey: requested Follow up in 1 weeks for [place] and see Dr Elonda Husky or me Continue Prometrium and no sex for now. Got flu shot at work in September.  Estill Dooms, NP 03/04/2014 12:25 PM

## 2014-03-04 NOTE — Progress Notes (Signed)
U/S(6+4wks)-single IUP with +FCA noted, FHR-94 bpm, CRL c/w 5+6wks, cx appears closed, bilateral adnexa appears WNL

## 2014-03-04 NOTE — Patient Instructions (Signed)
First Trimester of Pregnancy The first trimester of pregnancy is from week 1 until the end of week 12 (months 1 through 3). A week after a sperm fertilizes an egg, the egg will implant on the wall of the uterus. This embryo will begin to develop into a baby. Genes from you and your partner are forming the baby. The female genes determine whether the baby is a boy or a girl. At 6-8 weeks, the eyes and face are formed, and the heartbeat can be seen on ultrasound. At the end of 12 weeks, all the baby's organs are formed.  Now that you are pregnant, you will want to do everything you can to have a healthy baby. Two of the most important things are to get good prenatal care and to follow your health care provider's instructions. Prenatal care is all the medical care you receive before the baby's birth. This care will help prevent, find, and treat any problems during the pregnancy and childbirth. BODY CHANGES Your body goes through many changes during pregnancy. The changes vary from woman to woman.   You may gain or lose a couple of pounds at first.  You may feel sick to your stomach (nauseous) and throw up (vomit). If the vomiting is uncontrollable, call your health care provider.  You may tire easily.  You may develop headaches that can be relieved by medicines approved by your health care provider.  You may urinate more often. Painful urination may mean you have a bladder infection.  You may develop heartburn as a result of your pregnancy.  You may develop constipation because certain hormones are causing the muscles that push waste through your intestines to slow down.  You may develop hemorrhoids or swollen, bulging veins (varicose veins).  Your breasts may begin to grow larger and become tender. Your nipples may stick out more, and the tissue that surrounds them (areola) may become darker.  Your gums may bleed and may be sensitive to brushing and flossing.  Dark spots or blotches (chloasma,  mask of pregnancy) may develop on your face. This will likely fade after the baby is born.  Your menstrual periods will stop.  You may have a loss of appetite.  You may develop cravings for certain kinds of food.  You may have changes in your emotions from day to day, such as being excited to be pregnant or being concerned that something may go wrong with the pregnancy and baby.  You may have more vivid and strange dreams.  You may have changes in your hair. These can include thickening of your hair, rapid growth, and changes in texture. Some women also have hair loss during or after pregnancy, or hair that feels dry or thin. Your hair will most likely return to normal after your baby is born. WHAT TO EXPECT AT YOUR PRENATAL VISITS During a routine prenatal visit:  You will be weighed to make sure you and the baby are growing normally.  Your blood pressure will be taken.  Your abdomen will be measured to track your baby's growth.  The fetal heartbeat will be listened to starting around week 10 or 12 of your pregnancy.  Test results from any previous visits will be discussed. Your health care provider may ask you:  How you are feeling.  If you are feeling the baby move.  If you have had any abnormal symptoms, such as leaking fluid, bleeding, severe headaches, or abdominal cramping.  If you have any questions. Other tests   that may be performed during your first trimester include:  Blood tests to find your blood type and to check for the presence of any previous infections. They will also be used to check for low iron levels (anemia) and Rh antibodies. Later in the pregnancy, blood tests for diabetes will be done along with other tests if problems develop.  Urine tests to check for infections, diabetes, or protein in the urine.  An ultrasound to confirm the proper growth and development of the baby.  An amniocentesis to check for possible genetic problems.  Fetal screens for  spina bifida and Down syndrome.  You may need other tests to make sure you and the baby are doing well. HOME CARE INSTRUCTIONS  Medicines  Follow your health care provider's instructions regarding medicine use. Specific medicines may be either safe or unsafe to take during pregnancy.  Take your prenatal vitamins as directed.  If you develop constipation, try taking a stool softener if your health care provider approves. Diet  Eat regular, well-balanced meals. Choose a variety of foods, such as meat or vegetable-based protein, fish, milk and low-fat dairy products, vegetables, fruits, and whole grain breads and cereals. Your health care provider will help you determine the amount of weight gain that is right for you.  Avoid raw meat and uncooked cheese. These carry germs that can cause birth defects in the baby.  Eating four or five small meals rather than three large meals a day may help relieve nausea and vomiting. If you start to feel nauseous, eating a few soda crackers can be helpful. Drinking liquids between meals instead of during meals also seems to help nausea and vomiting.  If you develop constipation, eat more high-fiber foods, such as fresh vegetables or fruit and whole grains. Drink enough fluids to keep your urine clear or pale yellow. Activity and Exercise  Exercise only as directed by your health care provider. Exercising will help you:  Control your weight.  Stay in shape.  Be prepared for labor and delivery.  Experiencing pain or cramping in the lower abdomen or low back is a good sign that you should stop exercising. Check with your health care provider before continuing normal exercises.  Try to avoid standing for long periods of time. Move your legs often if you must stand in one place for a long time.  Avoid heavy lifting.  Wear low-heeled shoes, and practice good posture.  You may continue to have sex unless your health care provider directs you  otherwise. Relief of Pain or Discomfort  Wear a good support bra for breast tenderness.   Take warm sitz baths to soothe any pain or discomfort caused by hemorrhoids. Use hemorrhoid cream if your health care provider approves.   Rest with your legs elevated if you have leg cramps or low back pain.  If you develop varicose veins in your legs, wear support hose. Elevate your feet for 15 minutes, 3-4 times a day. Limit salt in your diet. Prenatal Care  Schedule your prenatal visits by the twelfth week of pregnancy. They are usually scheduled monthly at first, then more often in the last 2 months before delivery.  Write down your questions. Take them to your prenatal visits.  Keep all your prenatal visits as directed by your health care provider. Safety  Wear your seat belt at all times when driving.  Make a list of emergency phone numbers, including numbers for family, friends, the hospital, and police and fire departments. General Tips    Ask your health care provider for a referral to a local prenatal education class. Begin classes no later than at the beginning of month 6 of your pregnancy.  Ask for help if you have counseling or nutritional needs during pregnancy. Your health care provider can offer advice or refer you to specialists for help with various needs.  Do not use hot tubs, steam rooms, or saunas.  Do not douche or use tampons or scented sanitary pads.  Do not cross your legs for long periods of time.  Avoid cat litter boxes and soil used by cats. These carry germs that can cause birth defects in the baby and possibly loss of the fetus by miscarriage or stillbirth.  Avoid all smoking, herbs, alcohol, and medicines not prescribed by your health care provider. Chemicals in these affect the formation and growth of the baby.  Schedule a dentist appointment. At home, brush your teeth with a soft toothbrush and be gentle when you floss. SEEK MEDICAL CARE IF:   You have  dizziness.  You have mild pelvic cramps, pelvic pressure, or nagging pain in the abdominal area.  You have persistent nausea, vomiting, or diarrhea.  You have a bad smelling vaginal discharge.  You have pain with urination.  You notice increased swelling in your face, hands, legs, or ankles. SEEK IMMEDIATE MEDICAL CARE IF:   You have a fever.  You are leaking fluid from your vagina.  You have spotting or bleeding from your vagina.  You have severe abdominal cramping or pain.  You have rapid weight gain or loss.  You vomit blood or material that looks like coffee grounds.  You are exposed to [place] measles and have never had them.  You are exposed to fifth disease or chickenpox.  You develop a severe headache.  You have shortness of breath.  You have any kind of trauma, such as from a fall or a car accident. Document Released: 04/30/2001 Document Revised: 09/20/2013 Document Reviewed: 03/16/2013 [facility] Patient Information 2015 [place], [place]. This information is not intended to replace advice given to you by your health care provider. Make sure you discuss any questions you have with your health care provider. Follow up in 1 week for [place]

## 2014-03-05 LAB — DRUG SCREEN, URINE, NO CONFIRMATION
Amphetamine Screen, Ur: NEGATIVE
BENZODIAZEPINES.: NEGATIVE
Barbiturate Quant, Ur: NEGATIVE
Cocaine Metabolites: NEGATIVE
Creatinine,U: 193.1 mg/dL
Marijuana Metabolite: NEGATIVE
Methadone: NEGATIVE
Opiate Screen, Urine: NEGATIVE
Phencyclidine (PCP): NEGATIVE
Propoxyphene: NEGATIVE

## 2014-03-05 LAB — URINALYSIS, MICROSCOPIC ONLY
Bacteria, UA: NONE SEEN
Casts: NONE SEEN
Crystals: NONE SEEN

## 2014-03-05 LAB — URINALYSIS, ROUTINE W REFLEX MICROSCOPIC
Bilirubin Urine: NEGATIVE
Glucose, UA: NEGATIVE mg/dL
Ketones, ur: NEGATIVE mg/dL
Leukocytes, UA: NEGATIVE
Nitrite: NEGATIVE
Protein, ur: NEGATIVE mg/dL
Specific Gravity, Urine: 1.022 (ref 1.005–1.030)
Urobilinogen, UA: 0.2 mg/dL (ref 0.0–1.0)
pH: 7.5 (ref 5.0–8.0)

## 2014-03-05 LAB — OXYCODONE SCREEN, UA, RFLX CONFIRM: Oxycodone Screen, Ur: NEGATIVE ng/mL

## 2014-03-05 LAB — TSH: TSH: 1.868 u[IU]/mL (ref 0.350–4.500)

## 2014-03-05 LAB — URINE CULTURE
Colony Count: NO GROWTH
ORGANISM ID, BACTERIA: NO GROWTH

## 2014-03-05 LAB — ANTIBODY SCREEN: Antibody Screen: NEGATIVE

## 2014-03-05 LAB — VARICELLA ZOSTER ANTIBODY, IGG: Varicella IgG: 206.2 Index — ABNORMAL HIGH (ref <135.00)

## 2014-03-05 LAB — ABO AND RH: Rh Type: POSITIVE

## 2014-03-05 LAB — RPR

## 2014-03-05 LAB — RUBELLA SCREEN: Rubella: 1.13 Index — ABNORMAL HIGH (ref <0.90)

## 2014-03-05 LAB — HEPATITIS B SURFACE ANTIGEN: Hepatitis B Surface Ag: NEGATIVE

## 2014-03-05 LAB — HIV ANTIBODY (ROUTINE TESTING W REFLEX): HIV 1&2 Ab, 4th Generation: NONREACTIVE

## 2014-03-07 ENCOUNTER — Encounter: Payer: Self-pay | Admitting: Adult Health

## 2014-03-07 LAB — CYTOLOGY - PAP

## 2014-03-08 ENCOUNTER — Other Ambulatory Visit: Payer: Self-pay | Admitting: Adult Health

## 2014-03-08 DIAGNOSIS — O09291 Supervision of pregnancy with other poor reproductive or obstetric history, first trimester: Secondary | ICD-10-CM

## 2014-03-10 ENCOUNTER — Ambulatory Visit (INDEPENDENT_AMBULATORY_CARE_PROVIDER_SITE_OTHER): Payer: No Typology Code available for payment source | Admitting: Adult Health

## 2014-03-10 ENCOUNTER — Ambulatory Visit (INDEPENDENT_AMBULATORY_CARE_PROVIDER_SITE_OTHER): Payer: No Typology Code available for payment source

## 2014-03-10 ENCOUNTER — Encounter: Payer: Self-pay | Admitting: Adult Health

## 2014-03-10 ENCOUNTER — Other Ambulatory Visit: Payer: Self-pay | Admitting: Adult Health

## 2014-03-10 VITALS — BP 130/82 | Wt 137.5 lb

## 2014-03-10 DIAGNOSIS — Z1389 Encounter for screening for other disorder: Secondary | ICD-10-CM

## 2014-03-10 DIAGNOSIS — O021 Missed abortion: Secondary | ICD-10-CM

## 2014-03-10 DIAGNOSIS — Z331 Pregnant state, incidental: Secondary | ICD-10-CM

## 2014-03-10 DIAGNOSIS — O09291 Supervision of pregnancy with other poor reproductive or obstetric history, first trimester: Secondary | ICD-10-CM

## 2014-03-10 DIAGNOSIS — O039 Complete or unspecified spontaneous abortion without complication: Secondary | ICD-10-CM

## 2014-03-10 LAB — POCT URINALYSIS DIPSTICK
Glucose, UA: NEGATIVE
Ketones, UA: NEGATIVE
LEUKOCYTES UA: NEGATIVE
Nitrite, UA: NEGATIVE
PROTEIN UA: NEGATIVE

## 2014-03-10 MED ORDER — MISOPROSTOL 200 MCG PO TABS: ORAL_TABLET | ORAL | Status: DC

## 2014-03-10 NOTE — Progress Notes (Signed)
Brianna Griffin is in for [place] and has No FCA and has had some spotting, will proceed with cytotec and follow up in 2 weeks with me then in about 6 weeks see Dr Elonda Husky to discuss 2 miscarriages in a row.She is B+.

## 2014-03-10 NOTE — Progress Notes (Signed)
U/S-single IUP NO FCA Noted Brianna Griffin came in to observe realtime imaging also), CRL c/w 6+3wks = 6.58mm, cx appears closed, bilateral adnexa appears WNL, small amount of hemorrhage noted posterior to GS

## 2014-03-10 NOTE — Patient Instructions (Signed)
Miscarriage A miscarriage is the sudden loss of an unborn baby (fetus) before the 20th week of pregnancy. Most miscarriages happen in the first 3 months of pregnancy. Sometimes, it happens before a woman even knows she is pregnant. A miscarriage is also called a "spontaneous miscarriage" or "early pregnancy loss." Having a miscarriage can be an emotional experience. Talk with your caregiver about any questions you may have about miscarrying, the grieving process, and your future pregnancy plans. CAUSES   Problems with the fetal chromosomes that make it impossible for the baby to develop normally. Problems with the baby's genes or chromosomes are most often the result of errors that occur, by chance, as the embryo divides and grows. The problems are not inherited from the parents.  Infection of the cervix or uterus.   Hormone problems.   Problems with the cervix, such as having an incompetent cervix. This is when the tissue in the cervix is not strong enough to hold the pregnancy.   Problems with the uterus, such as an abnormally shaped uterus, uterine fibroids, or congenital abnormalities.   Certain medical conditions.   Smoking, drinking alcohol, or taking illegal drugs.   Trauma.  Often, the cause of a miscarriage is unknown.  SYMPTOMS   Vaginal bleeding or spotting, with or without cramps or pain.  Pain or cramping in the abdomen or lower back.  Passing fluid, tissue, or blood clots from the vagina. DIAGNOSIS  Your caregiver will perform a physical exam. You may also have an ultrasound to confirm the miscarriage. Blood or urine tests may also be ordered. TREATMENT   Sometimes, treatment is not necessary if you naturally pass all the fetal tissue that was in the uterus. If some of the fetus or placenta remains in the body (incomplete miscarriage), tissue left behind may become infected and must be removed. Usually, a dilation and curettage (D and C) procedure is performed.  During a D and C procedure, the cervix is widened (dilated) and any remaining fetal or placental tissue is gently removed from the uterus.  Antibiotic medicines are prescribed if there is an infection. Other medicines may be given to reduce the size of the uterus (contract) if there is a lot of bleeding.  If you have Rh negative blood and your baby was Rh positive, you will need a Rh immunoglobulin shot. This shot will protect any future baby from having Rh blood problems in future pregnancies. HOME CARE INSTRUCTIONS   Your caregiver may order bed rest or may allow you to continue light activity. Resume activity as directed by your caregiver.  Have someone help with home and family responsibilities during this time.   Keep track of the number of sanitary pads you use each day and how soaked (saturated) they are. Write down this information.   Do not use tampons. Do not douche or have sexual intercourse until approved by your caregiver.   Only take over-the-counter or prescription medicines for pain or discomfort as directed by your caregiver.   Do not take aspirin. Aspirin can cause bleeding.   Keep all follow-up appointments with your caregiver.   If you or your partner have problems with grieving, talk to your caregiver or seek counseling to help cope with the pregnancy loss. Allow enough time to grieve before trying to get pregnant again.  SEEK IMMEDIATE MEDICAL CARE IF:   You have severe cramps or pain in your back or abdomen.  You have a fever.  You pass large blood clots (walnut-sized   or larger) ortissue from your vagina. Save any tissue for your caregiver to inspect.   Your bleeding increases.   You have a thick, bad-smelling vaginal discharge.  You become lightheaded, weak, or you faint.   You have chills.  MAKE SURE YOU:  Understand these instructions.  Will watch your condition.  Will get help right away if you are not doing well or get  worse. Document Released: 10/30/2000 Document Revised: 08/31/2012 Document Reviewed: 06/25/2011 [facility] Patient Information 2015 [place], [place]. This information is not intended to replace advice given to you by your health care provider. Make sure you discuss any questions you have with your health care provider. Take 4 cytotec and can repeat in 24 hours if needed See me in 2 weeks and Dr Elonda Husky in 6 weeks

## 2014-03-11 ENCOUNTER — Encounter: Payer: Self-pay | Admitting: Adult Health

## 2014-03-11 ENCOUNTER — Other Ambulatory Visit: Payer: No Typology Code available for payment source

## 2014-03-14 ENCOUNTER — Encounter: Payer: Self-pay | Admitting: Adult Health

## 2014-03-21 ENCOUNTER — Encounter: Payer: Self-pay | Admitting: Adult Health

## 2014-03-25 ENCOUNTER — Encounter: Payer: Self-pay | Admitting: Adult Health

## 2014-03-25 ENCOUNTER — Ambulatory Visit (INDEPENDENT_AMBULATORY_CARE_PROVIDER_SITE_OTHER): Payer: Medicaid Other | Admitting: Adult Health

## 2014-03-25 VITALS — BP 128/74 | Ht 64.0 in | Wt 138.5 lb

## 2014-03-25 DIAGNOSIS — O039 Complete or unspecified spontaneous abortion without complication: Secondary | ICD-10-CM

## 2014-03-25 NOTE — Patient Instructions (Signed)
Take prenatals Follow up prn

## 2014-03-25 NOTE — Progress Notes (Signed)
Subjective:     Patient ID: Brianna Griffin, female   DOB: [birth year], 28 y.o.   MRN: [id number]  HPI Brianna Griffin is a 28 year old white female in for follow up of miscarriage.She wants to be pregnant again soon.Emotions good,seems to have leveled out.  Review of Systems See HPI Reviewed past medical,surgical, social and family history. Reviewed medications and allergies.     Objective:   Physical Exam BP 128/74 mmHg  Ht 5\' 4"  (1.626 m)  Wt 138 lb 8 oz (62.823 kg)  BMI 23.76 kg/m2  LMP 01/17/2014  Breastfeeding? No Skin warm and dry.Pelvic: external genitalia is normal in appearance, vagina: white discharge without odor, cervix: bulbous, everted at os, uterus: normal size, shape and contour, non tender, no masses felt, adnexa: no masses or tenderness noted.Has dark spot left big toe,see dermatologist.    Assessment:     Sp miscarriage    Plan:     Check QHCG Follow up prn Take prenatals and call as soon as +HPT, will get progesterone level and QHCG

## 2014-03-26 LAB — HCG, QUANTITATIVE, PREGNANCY: hCG, Beta Chain, Quant, S: 6.7 m[IU]/mL

## 2014-03-28 ENCOUNTER — Telehealth: Payer: Self-pay | Admitting: Adult Health

## 2014-03-28 NOTE — Telephone Encounter (Signed)
Pt aware of QHCG 6.7

## 2014-03-31 ENCOUNTER — Other Ambulatory Visit: Payer: Self-pay | Admitting: Family Medicine

## 2014-03-31 ENCOUNTER — Encounter: Payer: Self-pay | Admitting: Family Medicine

## 2014-03-31 MED ORDER — CIPROFLOXACIN HCL 500 MG PO TABS: 500.0000 mg | ORAL_TABLET | Freq: Two times a day (BID) | ORAL | Status: DC

## 2014-03-31 NOTE — Telephone Encounter (Signed)
MyChart Message:  I am requesting that Dr. Richardson Landry be asked if he will PLEASE call in an antibiotic to my pharmacy. I was in not long ago for UTI and I have symptoms again. These have been called in for me before and I was instructed to call back if the particular med didn't help, due to the fairly recurrent nature of these infections. I started a new job less than a month ago and cannot take time off to come in; I also don't have insurance yet. Please let me know ASAP

## 2014-03-31 NOTE — Telephone Encounter (Signed)
Cipro sent. Pt notified.

## 2014-03-31 NOTE — Telephone Encounter (Signed)
Patient called today regarding the email that was sent through my-chart for Dr. Richardson Landry.  She was wanting to know if we could call in an antibiotic for her reccurent UTI?  She just started a new job and does not have insurance.  She would like to know an answer today if possible so she may let her job know if she has to come in.

## 2014-04-22 ENCOUNTER — Ambulatory Visit: Payer: No Typology Code available for payment source | Admitting: Obstetrics & Gynecology

## 2014-06-09 ENCOUNTER — Encounter: Payer: Self-pay | Admitting: Adult Health

## 2014-06-09 ENCOUNTER — Other Ambulatory Visit: Payer: Self-pay | Admitting: Adult Health

## 2014-06-09 MED ORDER — PANTOPRAZOLE SODIUM 20 MG PO TBEC
20.0000 mg | DELAYED_RELEASE_TABLET | Freq: Every day | ORAL | Status: DC
Start: 2014-06-09 — End: 2015-08-18

## 2014-07-10 ENCOUNTER — Encounter: Payer: Self-pay | Admitting: Adult Health

## 2014-07-14 ENCOUNTER — Encounter: Payer: Self-pay | Admitting: Adult Health

## 2014-07-19 ENCOUNTER — Ambulatory Visit (INDEPENDENT_AMBULATORY_CARE_PROVIDER_SITE_OTHER): Payer: BLUE CROSS/BLUE SHIELD | Admitting: Adult Health

## 2014-07-19 ENCOUNTER — Encounter: Payer: Self-pay | Admitting: Adult Health

## 2014-07-19 VITALS — BP 122/62 | HR 92 | Ht 64.0 in | Wt 135.0 lb

## 2014-07-19 DIAGNOSIS — Z319 Encounter for procreative management, unspecified: Secondary | ICD-10-CM | POA: Diagnosis not present

## 2014-07-19 DIAGNOSIS — N898 Other specified noninflammatory disorders of vagina: Secondary | ICD-10-CM

## 2014-07-19 HISTORY — DX: Other specified noninflammatory disorders of vagina: N89.8

## 2014-07-19 NOTE — Patient Instructions (Signed)
Have sex today Check  Progesterone level  3/8 Follow up prn

## 2014-07-19 NOTE — Progress Notes (Signed)
Subjective:     Patient ID: Coral Spikes, female   DOB: [birth year], 29 y.o.   MRN: [id number]  HPI Derin is a 29 year old white female in complaining of ?protrusion at vagina,and desires pregnancy. She is taking prenatals already.  Review of Systems  +vaginal protrusion Patient denies any headaches, hearing loss, fatigue, blurred vision, shortness of breath, chest pain, abdominal pain, problems with bowel movements, urination, or intercourse. No joint pain or mood swings. Reviewed past medical,surgical, social and family history. Reviewed medications and allergies.     Objective:   Physical Exam    BP 122/62 mmHg  Pulse 92  Ht 5\' 4"  (1.626 m)  Wt 135 lb (61.236 kg)  BMI 23.16 kg/m2  LMP 07/07/2013 Skin warm and dry.Pelvic: external genitalia is normal in appearance no lesions, vagina: has hymenal remnant slightly protruding at introitus.Discussed hymen and how it breaks then when baby comes through and tears, how tissue can be.  Assessment:     Hymenal remnant Desires pregnancy    Plan:     Have sex today and in 2 days, his schedule as interfered with timing of sex. Check progesterone level 3/8 Follow up prn

## 2014-07-28 ENCOUNTER — Telehealth: Payer: Self-pay | Admitting: Adult Health

## 2014-07-28 LAB — PROGESTERONE: Progesterone: 9.2 ng/mL

## 2014-07-28 NOTE — Telephone Encounter (Signed)
Pt aware progesterone 9.2

## 2014-08-03 ENCOUNTER — Encounter: Payer: Self-pay | Admitting: Family Medicine

## 2014-08-03 ENCOUNTER — Ambulatory Visit (INDEPENDENT_AMBULATORY_CARE_PROVIDER_SITE_OTHER): Payer: BLUE CROSS/BLUE SHIELD | Admitting: Family Medicine

## 2014-08-03 VITALS — BP 110/70 | Temp 98.1°F | Ht 64.0 in | Wt 135.1 lb

## 2014-08-03 DIAGNOSIS — N390 Urinary tract infection, site not specified: Secondary | ICD-10-CM | POA: Diagnosis not present

## 2014-08-03 LAB — POCT URINALYSIS DIPSTICK
Spec Grav, UA: 1.015
pH, UA: 5

## 2014-08-03 MED ORDER — CEFDINIR 300 MG PO CAPS: 300.0000 mg | ORAL_CAPSULE | Freq: Two times a day (BID) | ORAL | Status: DC

## 2014-08-03 NOTE — Progress Notes (Signed)
   Subjective:    Patient ID: Brianna Griffin, female    DOB: September 15, 1985, 29 y.o.   MRN: [id number]  Urinary Tract Infection  This is a new problem. The current episode started in the past 7 days. The problem occurs intermittently. The problem has been unchanged. The quality of the pain is described as burning. The pain is moderate. There has been no fever. Associated symptoms include frequency. She has tried increased fluids for the symptoms. The treatment provided no relief.   burniung and incr freq hit yesterday  No fever or chills  Did not ck temp   Has been a long time  Patient states that she wants to discuss starting back on anxiety medication. I explained to the patient that we do not do this type of management at a same day appointment.    Review of Systems  Genitourinary: Positive for frequency.   No vomiting no diarrhea no flank pain    Objective:   Physical Exam  Alert somewhat anxious. No acute distress lungs clear heart regular in rhythm. No CVA tenderness.   Mental status midst increased anxiety. Frustration about inability to get pregnant no frank depression.      Assessment & Plan:  Impression urinary tract infection urinalysis reveals numerous white blood cells #2 anxiety with history of serotonin reuptake inhibitor. Since patient is actively trying to get pregnant I do not feel comfortable resuming this. Advised her to speak with her OB/GYN about potential medicine. Plan antibiotics prescribed. Urine culture. WSL

## 2014-08-04 ENCOUNTER — Encounter: Payer: Self-pay | Admitting: Adult Health

## 2014-08-05 LAB — URINE CULTURE

## 2014-08-05 LAB — SPECIMEN STATUS REPORT

## 2014-09-01 ENCOUNTER — Encounter: Payer: Self-pay | Admitting: Adult Health

## 2014-09-16 ENCOUNTER — Ambulatory Visit (INDEPENDENT_AMBULATORY_CARE_PROVIDER_SITE_OTHER): Payer: BLUE CROSS/BLUE SHIELD | Admitting: Nurse Practitioner

## 2014-09-16 ENCOUNTER — Encounter: Payer: Self-pay | Admitting: Nurse Practitioner

## 2014-09-16 VITALS — BP 108/82 | Temp 98.6°F | Ht 64.0 in | Wt 136.0 lb

## 2014-09-16 DIAGNOSIS — J069 Acute upper respiratory infection, unspecified: Secondary | ICD-10-CM | POA: Diagnosis not present

## 2014-09-16 DIAGNOSIS — K2100 Gastro-esophageal reflux disease with esophagitis, without bleeding: Secondary | ICD-10-CM

## 2014-09-16 DIAGNOSIS — B9689 Other specified bacterial agents as the cause of diseases classified elsewhere: Secondary | ICD-10-CM

## 2014-09-16 MED ORDER — CEFDINIR 300 MG PO CAPS: 300.0000 mg | ORAL_CAPSULE | Freq: Two times a day (BID) | ORAL | Status: DC

## 2014-09-16 MED ORDER — SUCRALFATE 1 GM/10ML PO SUSP: 1.0000 g | Freq: Three times a day (TID) | ORAL | Status: DC

## 2014-09-16 MED ORDER — ALBUTEROL SULFATE HFA 108 (90 BASE) MCG/ACT IN AERS: 2.0000 | INHALATION_SPRAY | RESPIRATORY_TRACT | Status: DC | PRN

## 2014-09-16 NOTE — Patient Instructions (Addendum)
OTC antihistamine Nasacort AQ or Rhinocort AQ as directed  Food Choices for Gastroesophageal Reflux Disease When you have gastroesophageal reflux disease (GERD), the foods you eat and your eating habits are very important. Choosing the right foods can help ease the discomfort of GERD. WHAT GENERAL GUIDELINES DO I NEED TO FOLLOW?  Choose fruits, vegetables, whole grains, low-fat dairy products, and low-fat meat, fish, and poultry.  Limit fats such as oils, salad dressings, butter, nuts, and avocado.  Keep a food diary to identify foods that cause symptoms.  Avoid foods that cause reflux. These may be different for different people.  Eat frequent small meals instead of three large meals each day.  Eat your meals slowly, in a relaxed setting.  Limit fried foods.  Cook foods using methods other than frying.  Avoid drinking alcohol.  Avoid drinking large amounts of liquids with your meals.  Avoid bending over or lying down until 2-3 hours after eating. WHAT FOODS ARE NOT RECOMMENDED? The following are some foods and drinks that may worsen your symptoms: Vegetables Tomatoes. Tomato juice. Tomato and spaghetti sauce. Chili peppers. Onion and garlic. Horseradish. Fruits Oranges, grapefruit, and lemon (fruit and juice). Meats High-fat meats, fish, and poultry. This includes hot dogs, ribs, ham, sausage, salami, and bacon. Dairy Whole milk and chocolate milk. Sour cream. Cream. Butter. Ice cream. Cream cheese.  Beverages Coffee and tea, with or without caffeine. Carbonated beverages or energy drinks. Condiments Hot sauce. Barbecue sauce.  Sweets/Desserts Chocolate and cocoa. Donuts. Peppermint and spearmint. Fats and Oils High-fat foods, including [place] fries and potato chips. Other Vinegar. Strong spices, such as black pepper, white pepper, red pepper, cayenne, curry powder, cloves, ginger, and chili powder. The items listed above may not be a complete list of foods and  beverages to avoid. Contact your dietitian for more information. Document Released: 05/06/2005 Document Revised: 05/11/2013 Document Reviewed: 03/10/2013 [facility] Patient Information 2015 [place], [place]. This information is not intended to replace advice given to you by your health care provider. Make sure you discuss any questions you have with your health care provider.

## 2014-09-19 ENCOUNTER — Encounter: Payer: Self-pay | Admitting: Nurse Practitioner

## 2014-09-19 DIAGNOSIS — K2100 Gastro-esophageal reflux disease with esophagitis, without bleeding: Secondary | ICD-10-CM | POA: Insufficient documentation

## 2014-09-19 HISTORY — DX: Gastro-esophageal reflux disease with esophagitis, without bleeding: K21.00

## 2014-09-19 NOTE — Progress Notes (Signed)
Subjective:  Presents for c/o cold symptoms x 1 week. Fever resolved. Occasional non productive cough. PND. H/A better. No sore throat or ear pain. No wheezing. On Protonix for GERD. Mild reflux symptoms yesterday. No vomiting, diarrhea or abd pain. Some back pain mid back area between shoulder blades.   Objective:   BP 108/82 mmHg  Temp(Src) 98.6 F (37 C) (Oral)  Ht 5\' 4"  (1.626 m)  Wt 136 lb (61.689 kg)  BMI 23.33 kg/m2  LMP 09/02/2014 NAD. Alert, oriented. TMs retracted, no erythema. Pharynx injected with PND noted. Neck supple with mild anterior adenopathy. Lungs clear. Heart RRR. Abdomen soft, non distended, very mild epigastric area discomfort. No rebound, guarding or obvious masses. No pain with palpation of mid back area.  Assessment:  Problem List Items Addressed This Visit      Digestive   Gastroesophageal reflux disease with esophagitis    Other Visit Diagnoses    Bacterial upper respiratory infection    -  Primary    Relevant Medications    cefdinir (OMNICEF) 300 MG capsule       Plan:  Meds ordered this encounter  Medications  . sucralfate (CARAFATE) 1 GM/10ML suspension    Sig: Take 10 mLs (1 g total) by mouth 4 (four) times daily -  with meals and at bedtime.    Dispense:  420 mL    Refill:  0    Order Specific Question:  Supervising Provider    Answer:  Mikey Kirschner [[id number]]  . cefdinir (OMNICEF) 300 MG capsule    Sig: Take 1 capsule (300 mg total) by mouth 2 (two) times daily.    Dispense:  14 capsule    Refill:  0    Order Specific Question:  Supervising Provider    Answer:  Mikey Kirschner [[id number]]  . albuterol (PROVENTIL HFA;VENTOLIN HFA) 108 (90 BASE) MCG/ACT inhaler    Sig: Inhale 2 puffs into the lungs every 4 (four) hours as needed for wheezing.    Dispense:  1 Inhaler    Refill:  2    Order Specific Question:  Supervising Provider    Answer:  Mikey Kirschner [[id number]]   OTC meds as directed for congestion. Decrease caffeine intake. Call back  in 10-14 days if no improvement, sooner if worse.

## 2014-11-03 ENCOUNTER — Other Ambulatory Visit (HOSPITAL_COMMUNITY): Payer: Self-pay | Admitting: Obstetrics

## 2014-11-03 DIAGNOSIS — N96 Recurrent pregnancy loss: Secondary | ICD-10-CM

## 2014-11-11 ENCOUNTER — Ambulatory Visit (HOSPITAL_COMMUNITY)
Admission: RE | Admit: 2014-11-11 | Discharge: 2014-11-11 | Disposition: A | Discharge status: Home / Self Care | Payer: BLUE CROSS/BLUE SHIELD | Source: Ambulatory Visit | Attending: Obstetrics | Admitting: Obstetrics

## 2014-11-11 DIAGNOSIS — N96 Recurrent pregnancy loss: Secondary | ICD-10-CM | POA: Diagnosis present

## 2014-11-11 MED ORDER — IOHEXOL 300 MG/ML  SOLN
30.0000 mL | Freq: Once | INTRAMUSCULAR | Status: AC | PRN
  Administered 2014-11-11: 30 mL

## 2014-12-20 ENCOUNTER — Encounter: Payer: Self-pay | Admitting: *Deleted

## 2014-12-26 ENCOUNTER — Telehealth: Payer: Self-pay | Admitting: Adult Health

## 2014-12-26 DIAGNOSIS — Z3490 Encounter for supervision of normal pregnancy, unspecified, unspecified trimester: Secondary | ICD-10-CM

## 2014-12-26 NOTE — Telephone Encounter (Signed)
Pt called has appt Wednesday but had +HPT the weekend, will check [facility] and progesterone today

## 2014-12-27 ENCOUNTER — Telehealth: Payer: Self-pay | Admitting: Adult Health

## 2014-12-27 LAB — PROGESTERONE: PROGESTERONE: 25.8 ng/mL

## 2014-12-27 LAB — BETA HCG QUANT (REF LAB): hCG Quant: 134 m[IU]/mL

## 2014-12-27 NOTE — Telephone Encounter (Signed)
Pt aware of labs, has appt tomorrow

## 2014-12-28 ENCOUNTER — Encounter: Payer: Self-pay | Admitting: Adult Health

## 2014-12-28 ENCOUNTER — Ambulatory Visit (INDEPENDENT_AMBULATORY_CARE_PROVIDER_SITE_OTHER): Payer: BLUE CROSS/BLUE SHIELD | Admitting: Adult Health

## 2014-12-28 VITALS — BP 118/70 | HR 80 | Ht 64.0 in | Wt 137.0 lb

## 2014-12-28 DIAGNOSIS — Z3490 Encounter for supervision of normal pregnancy, unspecified, unspecified trimester: Secondary | ICD-10-CM

## 2014-12-28 DIAGNOSIS — Z3201 Encounter for pregnancy test, result positive: Secondary | ICD-10-CM

## 2014-12-28 DIAGNOSIS — N926 Irregular menstruation, unspecified: Secondary | ICD-10-CM | POA: Diagnosis not present

## 2014-12-28 HISTORY — DX: Encounter for supervision of normal pregnancy, unspecified, unspecified trimester: Z34.90

## 2014-12-28 LAB — POCT URINE PREGNANCY: Preg Test, Ur: POSITIVE — AB

## 2014-12-28 NOTE — Progress Notes (Signed)
Subjective:     Patient ID: Brianna Griffin, female   DOB: [birth year], 29 y.o.   MRN: [id number]  HPI Brianna Griffin is a 29 year old white female, married in for follow up [facility], had +HPT and on 8/8 QHCG 134 and progesterone 25.8, she has had several early miscarriages and is nervous.She had a normal HSG 11/11/14 by Dr Carlis Abbott, and was also placed on synthroid 25 mcg her TSH was 2.665 10/11/14 and follow up TSH was 1.382 12/08/14.  Review of Systems Patient denies any headaches, hearing loss, fatigue, blurred vision, shortness of breath, chest pain, abdominal pain, problems with bowel movements, urination, or intercourse. No joint pain or mood swings. Reviewed past medical,surgical, social and family history. Reviewed medications and allergies.     Objective:   Physical Exam BP 118/70 mmHg  Pulse 80  Ht 5\' 4"  (1.626 m)  Wt 137 lb (62.143 kg)  BMI 23.50 kg/m2  LMP 12/01/2014 UPT+, Skin warm and dry. Neck: mid line trachea, normal thyroid, good ROM, no lymphadenopathy noted. Lungs: clear to ausculation bilaterally. Cardiovascular: regular rate and rhythm.Has rash left bending of elbow, try corn starch, looks like heat rash.     Assessment:     Pregnant     Plan:     Check QHCG Follow up in 2 weeks, may get [place] and progesterone level then Review handout on first trimester Try avon skin so soft to repel mosquitoes

## 2014-12-28 NOTE — Patient Instructions (Signed)
First Trimester of Pregnancy The first trimester of pregnancy is from week 1 until the end of week 12 (months 1 through 3). A week after a sperm fertilizes an egg, the egg will implant on the wall of the uterus. This embryo will begin to develop into a baby. Genes from you and your partner are forming the baby. The female genes determine whether the baby is a boy or a girl. At 6-8 weeks, the eyes and face are formed, and the heartbeat can be seen on ultrasound. At the end of 12 weeks, all the baby's organs are formed.  Now that you are pregnant, you will want to do everything you can to have a healthy baby. Two of the most important things are to get good prenatal care and to follow your health care provider's instructions. Prenatal care is all the medical care you receive before the baby's birth. This care will help prevent, find, and treat any problems during the pregnancy and childbirth. BODY CHANGES Your body goes through many changes during pregnancy. The changes vary from woman to woman.   You may gain or lose a couple of pounds at first.  You may feel sick to your stomach (nauseous) and throw up (vomit). If the vomiting is uncontrollable, call your health care provider.  You may tire easily.  You may develop headaches that can be relieved by medicines approved by your health care provider.  You may urinate more often. Painful urination may mean you have a bladder infection.  You may develop heartburn as a result of your pregnancy.  You may develop constipation because certain hormones are causing the muscles that push waste through your intestines to slow down.  You may develop hemorrhoids or swollen, bulging veins (varicose veins).  Your breasts may begin to grow larger and become tender. Your nipples may stick out more, and the tissue that surrounds them (areola) may become darker.  Your gums may bleed and may be sensitive to brushing and flossing.  Dark spots or blotches (chloasma,  mask of pregnancy) may develop on your face. This will likely fade after the baby is born.  Your menstrual periods will stop.  You may have a loss of appetite.  You may develop cravings for certain kinds of food.  You may have changes in your emotions from day to day, such as being excited to be pregnant or being concerned that something may go wrong with the pregnancy and baby.  You may have more vivid and strange dreams.  You may have changes in your hair. These can include thickening of your hair, rapid growth, and changes in texture. Some women also have hair loss during or after pregnancy, or hair that feels dry or thin. Your hair will most likely return to normal after your baby is born. WHAT TO EXPECT AT YOUR PRENATAL VISITS During a routine prenatal visit:  You will be weighed to make sure you and the baby are growing normally.  Your blood pressure will be taken.  Your abdomen will be measured to track your baby's growth.  The fetal heartbeat will be listened to starting around week 10 or 12 of your pregnancy.  Test results from any previous visits will be discussed. Your health care provider may ask you:  How you are feeling.  If you are feeling the baby move.  If you have had any abnormal symptoms, such as leaking fluid, bleeding, severe headaches, or abdominal cramping.  If you have any questions. Other tests   that may be performed during your first trimester include:  Blood tests to find your blood type and to check for the presence of any previous infections. They will also be used to check for low iron levels (anemia) and Rh antibodies. Later in the pregnancy, blood tests for diabetes will be done along with other tests if problems develop.  Urine tests to check for infections, diabetes, or protein in the urine.  An ultrasound to confirm the proper growth and development of the baby.  An amniocentesis to check for possible genetic problems.  Fetal screens for  spina bifida and Down syndrome.  You may need other tests to make sure you and the baby are doing well. HOME CARE INSTRUCTIONS  Medicines  Follow your health care provider's instructions regarding medicine use. Specific medicines may be either safe or unsafe to take during pregnancy.  Take your prenatal vitamins as directed.  If you develop constipation, try taking a stool softener if your health care provider approves. Diet  Eat regular, well-balanced meals. Choose a variety of foods, such as meat or vegetable-based protein, fish, milk and low-fat dairy products, vegetables, fruits, and whole grain breads and cereals. Your health care provider will help you determine the amount of weight gain that is right for you.  Avoid raw meat and uncooked cheese. These carry germs that can cause birth defects in the baby.  Eating four or five small meals rather than three large meals a day may help relieve nausea and vomiting. If you start to feel nauseous, eating a few soda crackers can be helpful. Drinking liquids between meals instead of during meals also seems to help nausea and vomiting.  If you develop constipation, eat more high-fiber foods, such as fresh vegetables or fruit and whole grains. Drink enough fluids to keep your urine clear or pale yellow. Activity and Exercise  Exercise only as directed by your health care provider. Exercising will help you:  Control your weight.  Stay in shape.  Be prepared for labor and delivery.  Experiencing pain or cramping in the lower abdomen or low back is a good sign that you should stop exercising. Check with your health care provider before continuing normal exercises.  Try to avoid standing for long periods of time. Move your legs often if you must stand in one place for a long time.  Avoid heavy lifting.  Wear low-heeled shoes, and practice good posture.  You may continue to have sex unless your health care provider directs you  otherwise. Relief of Pain or Discomfort  Wear a good support bra for breast tenderness.   Take warm sitz baths to soothe any pain or discomfort caused by hemorrhoids. Use hemorrhoid cream if your health care provider approves.   Rest with your legs elevated if you have leg cramps or low back pain.  If you develop varicose veins in your legs, wear support hose. Elevate your feet for 15 minutes, 3-4 times a day. Limit salt in your diet. Prenatal Care  Schedule your prenatal visits by the twelfth week of pregnancy. They are usually scheduled monthly at first, then more often in the last 2 months before delivery.  Write down your questions. Take them to your prenatal visits.  Keep all your prenatal visits as directed by your health care provider. Safety  Wear your seat belt at all times when driving.  Make a list of emergency phone numbers, including numbers for family, friends, the hospital, and police and fire departments. General Tips    Ask your health care provider for a referral to a local prenatal education class. Begin classes no later than at the beginning of month 6 of your pregnancy.  Ask for help if you have counseling or nutritional needs during pregnancy. Your health care provider can offer advice or refer you to specialists for help with various needs.  Do not use hot tubs, steam rooms, or saunas.  Do not douche or use tampons or scented sanitary pads.  Do not cross your legs for long periods of time.  Avoid cat litter boxes and soil used by cats. These carry germs that can cause birth defects in the baby and possibly loss of the fetus by miscarriage or stillbirth.  Avoid all smoking, herbs, alcohol, and medicines not prescribed by your health care provider. Chemicals in these affect the formation and growth of the baby.  Schedule a dentist appointment. At home, brush your teeth with a soft toothbrush and be gentle when you floss. SEEK MEDICAL CARE IF:   You have  dizziness.  You have mild pelvic cramps, pelvic pressure, or nagging pain in the abdominal area.  You have persistent nausea, vomiting, or diarrhea.  You have a bad smelling vaginal discharge.  You have pain with urination.  You notice increased swelling in your face, hands, legs, or ankles. SEEK IMMEDIATE MEDICAL CARE IF:   You have a fever.  You are leaking fluid from your vagina.  You have spotting or bleeding from your vagina.  You have severe abdominal cramping or pain.  You have rapid weight gain or loss.  You vomit blood or material that looks like coffee grounds.  You are exposed to [place] measles and have never had them.  You are exposed to fifth disease or chickenpox.  You develop a severe headache.  You have shortness of breath.  You have any kind of trauma, such as from a fall or a car accident. Document Released: 04/30/2001 Document Revised: 09/20/2013 Document Reviewed: 03/16/2013 [facility] Patient Information 2015 [place], [place]. This information is not intended to replace advice given to you by your health care provider. Make sure you discuss any questions you have with your health care provider. Follow up in 2 weeks with me

## 2014-12-29 ENCOUNTER — Other Ambulatory Visit: Payer: Self-pay | Admitting: Adult Health

## 2014-12-29 ENCOUNTER — Telehealth: Payer: Self-pay | Admitting: Adult Health

## 2014-12-29 ENCOUNTER — Encounter: Payer: Self-pay | Admitting: Adult Health

## 2014-12-29 DIAGNOSIS — Z3490 Encounter for supervision of normal pregnancy, unspecified, unspecified trimester: Secondary | ICD-10-CM

## 2014-12-29 LAB — BETA HCG QUANT (REF LAB): hCG Quant: 332 m[IU]/mL

## 2014-12-29 NOTE — Telephone Encounter (Signed)
Pt aware QHCG more than doubled, keep appt

## 2015-01-04 ENCOUNTER — Other Ambulatory Visit: Payer: Self-pay | Admitting: Adult Health

## 2015-01-04 ENCOUNTER — Encounter: Payer: Self-pay | Admitting: Adult Health

## 2015-01-04 DIAGNOSIS — Z3490 Encounter for supervision of normal pregnancy, unspecified, unspecified trimester: Secondary | ICD-10-CM

## 2015-01-05 ENCOUNTER — Encounter: Payer: Self-pay | Admitting: Adult Health

## 2015-01-05 ENCOUNTER — Telehealth: Payer: Self-pay | Admitting: Adult Health

## 2015-01-05 LAB — BETA HCG QUANT (REF LAB): hCG Quant: 3072 m[IU]/mL

## 2015-01-05 LAB — PROGESTERONE: Progesterone: 18 ng/mL

## 2015-01-05 NOTE — Telephone Encounter (Signed)
Pt aware of labs results 

## 2015-01-10 ENCOUNTER — Encounter: Payer: Self-pay | Admitting: Adult Health

## 2015-01-10 ENCOUNTER — Ambulatory Visit (INDEPENDENT_AMBULATORY_CARE_PROVIDER_SITE_OTHER): Payer: BLUE CROSS/BLUE SHIELD | Admitting: Adult Health

## 2015-01-10 VITALS — BP 130/78 | HR 80 | Ht 64.0 in | Wt 137.5 lb

## 2015-01-10 DIAGNOSIS — Z331 Pregnant state, incidental: Secondary | ICD-10-CM

## 2015-01-10 DIAGNOSIS — O3680X0 Pregnancy with inconclusive fetal viability, not applicable or unspecified: Secondary | ICD-10-CM

## 2015-01-10 DIAGNOSIS — Z1389 Encounter for screening for other disorder: Secondary | ICD-10-CM | POA: Diagnosis not present

## 2015-01-10 DIAGNOSIS — Z3490 Encounter for supervision of normal pregnancy, unspecified, unspecified trimester: Secondary | ICD-10-CM

## 2015-01-10 LAB — POCT URINALYSIS DIPSTICK
Glucose, UA: NEGATIVE
Ketones, UA: NEGATIVE
LEUKOCYTES UA: NEGATIVE
NITRITE UA: NEGATIVE
Protein, UA: NEGATIVE

## 2015-01-10 NOTE — Patient Instructions (Signed)
Return 9/2 for dating [place]

## 2015-01-10 NOTE — Addendum Note (Signed)
Addended by: Derrek Monaco A on: 01/10/2015 12:47 PM   Modules accepted: Orders

## 2015-01-10 NOTE — Progress Notes (Signed)
Subjective:     Patient ID: Brianna Griffin, female   DOB: [birth year], 29 y.o.   MRN: [id number]  HPI Brianna Griffin is a 29 year old white female married back in follow up, she is doing good no bleeding.  Review of Systems Patient denies any headaches, hearing loss, fatigue, blurred vision, shortness of breath, chest pain, abdominal pain, problems with bowel movements, urination, or intercourse. No joint pain or mood swings. Reviewed past medical,surgical, social and family history. Reviewed medications and allergies.     Objective:   Physical Exam BP 130/78 mmHg  Pulse 80  Ht 5\' 4"  (1.626 m)  Wt 137 lb 8 oz (62.37 kg)  BMI 23.59 kg/m2  LMP 12/01/2014 urine trace blood, skin warm and dry abdomen soft and non tender, US shows IUP with YS, no fetal pole yet, will get [place] 9/2.    Assessment:     Pregnant     Plan:     Return 9/2 for dating [place]

## 2015-01-11 ENCOUNTER — Ambulatory Visit: Payer: BLUE CROSS/BLUE SHIELD | Admitting: Adult Health

## 2015-01-12 ENCOUNTER — Telehealth: Payer: Self-pay | Admitting: Adult Health

## 2015-01-12 ENCOUNTER — Encounter: Payer: Self-pay | Admitting: Adult Health

## 2015-01-12 MED ORDER — DOXYLAMINE-PYRIDOXINE 10-10 MG PO TBEC: DELAYED_RELEASE_TABLET | ORAL | Status: DC

## 2015-01-12 NOTE — Telephone Encounter (Signed)
Pt aware diclegis called in

## 2015-01-12 NOTE — Telephone Encounter (Signed)
Will rx diclegis  

## 2015-01-17 ENCOUNTER — Encounter: Payer: Self-pay | Admitting: Adult Health

## 2015-01-20 ENCOUNTER — Ambulatory Visit (INDEPENDENT_AMBULATORY_CARE_PROVIDER_SITE_OTHER): Payer: BLUE CROSS/BLUE SHIELD

## 2015-01-20 ENCOUNTER — Encounter: Payer: Self-pay | Admitting: Obstetrics & Gynecology

## 2015-01-20 ENCOUNTER — Ambulatory Visit (INDEPENDENT_AMBULATORY_CARE_PROVIDER_SITE_OTHER): Payer: BLUE CROSS/BLUE SHIELD | Admitting: Obstetrics & Gynecology

## 2015-01-20 DIAGNOSIS — O200 Threatened abortion: Secondary | ICD-10-CM | POA: Diagnosis not present

## 2015-01-20 DIAGNOSIS — O3680X0 Pregnancy with inconclusive fetal viability, not applicable or unspecified: Secondary | ICD-10-CM

## 2015-01-20 DIAGNOSIS — O43891 Other placental disorders, first trimester: Secondary | ICD-10-CM

## 2015-01-20 NOTE — Progress Notes (Signed)
Patient ID: Brianna Griffin, female   DOB: 14-Feb-1986, 29 y.o.   MRN: [id number] Chief Complaint  Patient presents with  . Follow-up    ultrasound today.    Last menstrual period 12/01/2014.  28 y.o. [id number] Patient's last menstrual period was 12/01/2014. The current method of family planning is pregnant.  Subjective Pt with 2 previous pregnancy losses and had some spotting yesterday On prometrium 200 qhs as a precaution  Sonogram today is reassuring, viable  Objective   Pertinent ROS No burning with urination, frequency or urgency No nausea, vomiting or diarrhea Nor fever chills or other constitutional symptoms   Labs or studies US Ob Comp Less 14 Wks  01/20/2015   DATING AND VIABILITY SONOGRAM   Brianna Griffin is a 29 y.o. year old [id number] with LMP 12/01/2014 which  would correlate to  09/07/2015 weeks gestation.  She has regular menstrual  cycles.   She is here today for a confirmatory initial sonogram.    GESTATION: SINGLETON     FETAL ACTIVITY:          Heart rate         143BPM         CERVIX: Appears closed  ADNEXA: The ovaries are normal.   GESTATIONAL AGE AND  BIOMETRICS:  Gestational criteria: Estimated Date of Delivery: 4/20/2017by LMP now at  7+1wks  Previous Scans:0      CROWN RUMP LENGTH           10.9 mm         7 weeks                                                                               AVERAGE EGA(BY THIS SCAN):   7 weeks  WORKING EDD( LMP ):  09/07/2015     TECHNICIAN COMMENTS:  [place] 7+1wks,single IUP w/ys, pos fht 143 bpm,crl 10.81mm,normal ov's  bilat,EDD 09/07/2015   A copy of this report including all images has been saved and backed up to  a second source for retrieval if needed. All measures and details of the  anatomical scan, placentation, fluid volume and pelvic anatomy are  contained in that report.  Brianna Griffin 01/20/2015 12:03 PM  Clinical Impression and recommendations:  I have reviewed the sonogram results above, combined with the patient's  current  clinical course, below are my impressions and any appropriate  recommendations for management based on the sonographic findings.  Viable early IUP [id number] Estimated Date of Delivery: 09/07/2015 By today's sonogram Normal general sonographic findings  Recommend routine care unless otherwise clinically indicated  Brianna Griffin H 01/20/2015 12:38 PM       Impression Diagnoses this Encounter::   ICD-9-CM ICD-10-CM   1. Threatened abortion in early pregnancy 640.03 O20.0   2. Subchorionic hematoma, first trimester 656.73 O43.891     Established relevant diagnosis(es): History of pregnancy loss x 2  Plan/Recommendations: No orders of the defined types were placed in this encounter.    Labs or Scans Ordered: No orders of the defined types were placed in this encounter.      Follow up Return in about 2 weeks (around 02/03/2015) for NOB visit with Victorino Dike.  Face to face time:  15 minutes  Greater than 50% of the visit time was spent in counseling and coordination of care with the patient.  The summary and outline of the counseling and care coordination is summarized in the note above.   All questions were answered.

## 2015-01-20 NOTE — Progress Notes (Signed)
[place] 7+1wks,single IUP w/ys, pos fht 143 bpm,crl 10.16mm,normal ov's bilat,EDD 09/07/2015

## 2015-01-24 ENCOUNTER — Telehealth: Payer: Self-pay | Admitting: Adult Health

## 2015-01-24 ENCOUNTER — Encounter: Payer: Self-pay | Admitting: Adult Health

## 2015-01-24 NOTE — Telephone Encounter (Signed)
Left message [place] looked good

## 2015-01-25 ENCOUNTER — Encounter: Payer: Self-pay | Admitting: Adult Health

## 2015-01-30 ENCOUNTER — Encounter: Payer: Self-pay | Admitting: Adult Health

## 2015-01-31 ENCOUNTER — Ambulatory Visit (INDEPENDENT_AMBULATORY_CARE_PROVIDER_SITE_OTHER): Payer: BLUE CROSS/BLUE SHIELD | Admitting: Adult Health

## 2015-01-31 ENCOUNTER — Encounter: Payer: Self-pay | Admitting: Adult Health

## 2015-01-31 VITALS — BP 124/80 | HR 80 | Ht 64.0 in | Wt 138.0 lb

## 2015-01-31 DIAGNOSIS — Z331 Pregnant state, incidental: Secondary | ICD-10-CM

## 2015-01-31 DIAGNOSIS — N898 Other specified noninflammatory disorders of vagina: Secondary | ICD-10-CM

## 2015-01-31 DIAGNOSIS — Z3490 Encounter for supervision of normal pregnancy, unspecified, unspecified trimester: Secondary | ICD-10-CM

## 2015-01-31 HISTORY — DX: Other specified noninflammatory disorders of vagina: N89.8

## 2015-01-31 LAB — POCT WET PREP (WET MOUNT): WBC, Wet Prep HPF POC: POSITIVE

## 2015-01-31 NOTE — Patient Instructions (Signed)
Keep appt as scheduled

## 2015-01-31 NOTE — Progress Notes (Signed)
Subjective:     Patient ID: Brianna Griffin, female   DOB: August 10, 1985, 29 y.o.   MRN: [id number]  HPI Brianna Griffin is a 29 year old white female, married, and pregnant, had watery discharge yesterday, no itching or burning.  Review of Systems Patient denies any headaches, hearing loss, fatigue, blurred vision, shortness of breath, chest pain, abdominal pain, problems with bowel movements, urination, or intercourse. No joint pain or mood swings.+watery discharge Reviewed past medical,surgical, social and family history. Reviewed medications and allergies.     Objective:   Physical Exam BP 124/80 mmHg  Pulse 80  Ht 5\' 4"  (1.626 m)  Wt 138 lb (62.596 kg)  BMI 23.68 kg/m2  LMP 12/01/2014 Skin warm and dry.Pelvic: external genitalia is normal in appearance no lesions, vagina:thin white discharge without odor,urethra has no lesions or masses noted, cervix: bulbous, uterus: 8 week size, shape and contour, non tender, no masses felt, adnexa: no masses or tenderness noted. Bladder is non tender and no masses felt. Wet prep:+WBCs.   US shows active fetus, with FHR 182, and good YS,has good fluid in sac.  Assessment:     Vaginal discharge Pregnant    Plan:     Keep appt Thursday for new OB

## 2015-02-02 ENCOUNTER — Encounter: Payer: Self-pay | Admitting: Adult Health

## 2015-02-02 ENCOUNTER — Ambulatory Visit (INDEPENDENT_AMBULATORY_CARE_PROVIDER_SITE_OTHER): Payer: BLUE CROSS/BLUE SHIELD | Admitting: Adult Health

## 2015-02-02 VITALS — BP 120/68 | HR 84 | Wt 137.0 lb

## 2015-02-02 DIAGNOSIS — Z1389 Encounter for screening for other disorder: Secondary | ICD-10-CM

## 2015-02-02 DIAGNOSIS — Z331 Pregnant state, incidental: Secondary | ICD-10-CM

## 2015-02-02 DIAGNOSIS — Z1329 Encounter for screening for other suspected endocrine disorder: Secondary | ICD-10-CM

## 2015-02-02 DIAGNOSIS — Z3490 Encounter for supervision of normal pregnancy, unspecified, unspecified trimester: Secondary | ICD-10-CM | POA: Insufficient documentation

## 2015-02-02 DIAGNOSIS — Z0283 Encounter for blood-alcohol and blood-drug test: Secondary | ICD-10-CM

## 2015-02-02 DIAGNOSIS — Z3481 Encounter for supervision of other normal pregnancy, first trimester: Secondary | ICD-10-CM

## 2015-02-02 DIAGNOSIS — Z369 Encounter for antenatal screening, unspecified: Secondary | ICD-10-CM

## 2015-02-02 DIAGNOSIS — Z3480 Encounter for supervision of other normal pregnancy, unspecified trimester: Secondary | ICD-10-CM

## 2015-02-02 HISTORY — DX: Encounter for supervision of other normal pregnancy, unspecified trimester: Z34.80

## 2015-02-02 LAB — POCT URINALYSIS DIPSTICK
Blood, UA: NEGATIVE
GLUCOSE UA: NEGATIVE
Ketones, UA: NEGATIVE
LEUKOCYTES UA: NEGATIVE
NITRITE UA: NEGATIVE
Protein, UA: NEGATIVE

## 2015-02-02 NOTE — Patient Instructions (Signed)
Second Trimester of Pregnancy The second trimester is from week 13 through week 28, months 4 through 6. The second trimester is often a time when you feel your best. Your body has also adjusted to being pregnant, and you begin to feel better physically. Usually, morning sickness has lessened or quit completely, you may have more energy, and you may have an increase in appetite. The second trimester is also a time when the fetus is growing rapidly. At the end of the sixth month, the fetus is about 9 inches long and weighs about 1 pounds. You will likely begin to feel the baby move (quickening) between 18 and 20 weeks of the pregnancy. BODY CHANGES Your body goes through many changes during pregnancy. The changes vary from woman to woman.   Your weight will continue to increase. You will notice your lower abdomen bulging out.  You may begin to get stretch marks on your hips, abdomen, and breasts.  You may develop headaches that can be relieved by medicines approved by your health care provider.  You may urinate more often because the fetus is pressing on your bladder.  You may develop or continue to have heartburn as a result of your pregnancy.  You may develop constipation because certain hormones are causing the muscles that push waste through your intestines to slow down.  You may develop hemorrhoids or swollen, bulging veins (varicose veins).  You may have back pain because of the weight gain and pregnancy hormones relaxing your joints between the bones in your pelvis and as a result of a shift in weight and the muscles that support your balance.  Your breasts will continue to grow and be tender.  Your gums may bleed and may be sensitive to brushing and flossing.  Dark spots or blotches (chloasma, mask of pregnancy) may develop on your face. This will likely fade after the baby is born.  A dark line from your belly button to the pubic area (linea nigra) may appear. This will likely fade  after the baby is born.  You may have changes in your hair. These can include thickening of your hair, rapid growth, and changes in texture. Some women also have hair loss during or after pregnancy, or hair that feels dry or thin. Your hair will most likely return to normal after your baby is born. WHAT TO EXPECT AT YOUR PRENATAL VISITS During a routine prenatal visit:  You will be weighed to make sure you and the fetus are growing normally.  Your blood pressure will be taken.  Your abdomen will be measured to track your baby's growth.  The fetal heartbeat will be listened to.  Any test results from the previous visit will be discussed. Your health care provider may ask you:  How you are feeling.  If you are feeling the baby move.  If you have had any abnormal symptoms, such as leaking fluid, bleeding, severe headaches, or abdominal cramping.  If you have any questions. Other tests that may be performed during your second trimester include:  Blood tests that check for:  Low iron levels (anemia).  Gestational diabetes (between 24 and 28 weeks).  Rh antibodies.  Urine tests to check for infections, diabetes, or protein in the urine.  An ultrasound to confirm the proper growth and development of the baby.  An amniocentesis to check for possible genetic problems.  Fetal screens for spina bifida and Down syndrome. HOME CARE INSTRUCTIONS   Avoid all smoking, herbs, alcohol, and unprescribed   drugs. These chemicals affect the formation and growth of the baby.  Follow your health care provider's instructions regarding medicine use. There are medicines that are either safe or unsafe to take during pregnancy.  Exercise only as directed by your health care provider. Experiencing uterine cramps is a good sign to stop exercising.  Continue to eat regular, healthy meals.  Wear a good support bra for breast tenderness.  Do not use hot tubs, steam rooms, or saunas.  Wear your  seat belt at all times when driving.  Avoid raw meat, uncooked cheese, cat litter boxes, and soil used by cats. These carry germs that can cause birth defects in the baby.  Take your prenatal vitamins.  Try taking a stool softener (if your health care provider approves) if you develop constipation. Eat more high-fiber foods, such as fresh vegetables or fruit and whole grains. Drink plenty of fluids to keep your urine clear or pale yellow.  Take warm sitz baths to soothe any pain or discomfort caused by hemorrhoids. Use hemorrhoid cream if your health care provider approves.  If you develop varicose veins, wear support hose. Elevate your feet for 15 minutes, 3-4 times a day. Limit salt in your diet.  Avoid heavy lifting, wear low heel shoes, and practice good posture.  Rest with your legs elevated if you have leg cramps or low back pain.  Visit your dentist if you have not gone yet during your pregnancy. Use a soft toothbrush to brush your teeth and be gentle when you floss.  A sexual relationship may be continued unless your health care provider directs you otherwise.  Continue to go to all your prenatal visits as directed by your health care provider. SEEK MEDICAL CARE IF:   You have dizziness.  You have mild pelvic cramps, pelvic pressure, or nagging pain in the abdominal area.  You have persistent nausea, vomiting, or diarrhea.  You have a bad smelling vaginal discharge.  You have pain with urination. SEEK IMMEDIATE MEDICAL CARE IF:   You have a fever.  You are leaking fluid from your vagina.  You have spotting or bleeding from your vagina.  You have severe abdominal cramping or pain.  You have rapid weight gain or loss.  You have shortness of breath with chest pain.  You notice sudden or extreme swelling of your face, hands, ankles, feet, or legs.  You have not felt your baby move in over an hour.  You have severe headaches that do not go away with  medicine.  You have vision changes. Document Released: 04/30/2001 Document Revised: 05/11/2013 Document Reviewed: 07/07/2012 [facility] Patient Information 2015 [place], [place]. This information is not intended to replace advice given to you by your health care provider. Make sure you discuss any questions you have with your health care provider. First Trimester of Pregnancy The first trimester of pregnancy is from week 1 until the end of week 12 (months 1 through 3). A week after a sperm fertilizes an egg, the egg will implant on the wall of the uterus. This embryo will begin to develop into a baby. Genes from you and your partner are forming the baby. The female genes determine whether the baby is a boy or a girl. At 6-8 weeks, the eyes and face are formed, and the heartbeat can be seen on ultrasound. At the end of 12 weeks, all the baby's organs are formed.  Now that you are pregnant, you will want to do everything you can to have  a healthy baby. Two of the most important things are to get good prenatal care and to follow your health care provider's instructions. Prenatal care is all the medical care you receive before the baby's birth. This care will help prevent, find, and treat any problems during the pregnancy and childbirth. BODY CHANGES Your body goes through many changes during pregnancy. The changes vary from woman to woman.   You may gain or lose a couple of pounds at first.  You may feel sick to your stomach (nauseous) and throw up (vomit). If the vomiting is uncontrollable, call your health care provider.  You may tire easily.  You may develop headaches that can be relieved by medicines approved by your health care provider.  You may urinate more often. Painful urination may mean you have a bladder infection.  You may develop heartburn as a result of your pregnancy.  You may develop constipation because certain hormones are causing the muscles that push waste through your intestines  to slow down.  You may develop hemorrhoids or swollen, bulging veins (varicose veins).  Your breasts may begin to grow larger and become tender. Your nipples may stick out more, and the tissue that surrounds them (areola) may become darker.  Your gums may bleed and may be sensitive to brushing and flossing.  Dark spots or blotches (chloasma, mask of pregnancy) may develop on your face. This will likely fade after the baby is born.  Your menstrual periods will stop.  You may have a loss of appetite.  You may develop cravings for certain kinds of food.  You may have changes in your emotions from day to day, such as being excited to be pregnant or being concerned that something may go wrong with the pregnancy and baby.  You may have more vivid and strange dreams.  You may have changes in your hair. These can include thickening of your hair, rapid growth, and changes in texture. Some women also have hair loss during or after pregnancy, or hair that feels dry or thin. Your hair will most likely return to normal after your baby is born. WHAT TO EXPECT AT YOUR PRENATAL VISITS During a routine prenatal visit:  You will be weighed to make sure you and the baby are growing normally.  Your blood pressure will be taken.  Your abdomen will be measured to track your baby's growth.  The fetal heartbeat will be listened to starting around week 10 or 12 of your pregnancy.  Test results from any previous visits will be discussed. Your health care provider may ask you:  How you are feeling.  If you are feeling the baby move.  If you have had any abnormal symptoms, such as leaking fluid, bleeding, severe headaches, or abdominal cramping.  If you have any questions. Other tests that may be performed during your first trimester include:  Blood tests to find your blood type and to check for the presence of any previous infections. They will also be used to check for low iron levels (anemia) and  Rh antibodies. Later in the pregnancy, blood tests for diabetes will be done along with other tests if problems develop.  Urine tests to check for infections, diabetes, or protein in the urine.  An ultrasound to confirm the proper growth and development of the baby.  An amniocentesis to check for possible genetic problems.  Fetal screens for spina bifida and Down syndrome.  You may need other tests to make sure you and the baby  are doing well. HOME CARE INSTRUCTIONS  Medicines  Follow your health care provider's instructions regarding medicine use. Specific medicines may be either safe or unsafe to take during pregnancy.  Take your prenatal vitamins as directed.  If you develop constipation, try taking a stool softener if your health care provider approves. Diet  Eat regular, well-balanced meals. Choose a variety of foods, such as meat or vegetable-based protein, fish, milk and low-fat dairy products, vegetables, fruits, and whole grain breads and cereals. Your health care provider will help you determine the amount of weight gain that is right for you.  Avoid raw meat and uncooked cheese. These carry germs that can cause birth defects in the baby.  Eating four or five small meals rather than three large meals a day may help relieve nausea and vomiting. If you start to feel nauseous, eating a few soda crackers can be helpful. Drinking liquids between meals instead of during meals also seems to help nausea and vomiting.  If you develop constipation, eat more high-fiber foods, such as fresh vegetables or fruit and whole grains. Drink enough fluids to keep your urine clear or pale yellow. Activity and Exercise  Exercise only as directed by your health care provider. Exercising will help you:  Control your weight.  Stay in shape.  Be prepared for labor and delivery.  Experiencing pain or cramping in the lower abdomen or low back is a good sign that you should stop exercising. Check  with your health care provider before continuing normal exercises.  Try to avoid standing for long periods of time. Move your legs often if you must stand in one place for a long time.  Avoid heavy lifting.  Wear low-heeled shoes, and practice good posture.  You may continue to have sex unless your health care provider directs you otherwise. Relief of Pain or Discomfort  Wear a good support bra for breast tenderness.   Take warm sitz baths to soothe any pain or discomfort caused by hemorrhoids. Use hemorrhoid cream if your health care provider approves.   Rest with your legs elevated if you have leg cramps or low back pain.  If you develop varicose veins in your legs, wear support hose. Elevate your feet for 15 minutes, 3-4 times a day. Limit salt in your diet. Prenatal Care  Schedule your prenatal visits by the twelfth week of pregnancy. They are usually scheduled monthly at first, then more often in the last 2 months before delivery.  Write down your questions. Take them to your prenatal visits.  Keep all your prenatal visits as directed by your health care provider. Safety  Wear your seat belt at all times when driving.  Make a list of emergency phone numbers, including numbers for family, friends, the hospital, and police and fire departments. General Tips  Ask your health care provider for a referral to a local prenatal education class. Begin classes no later than at the beginning of month 6 of your pregnancy.  Ask for help if you have counseling or nutritional needs during pregnancy. Your health care provider can offer advice or refer you to specialists for help with various needs.  Do not use hot tubs, steam rooms, or saunas.  Do not douche or use tampons or scented sanitary pads.  Do not cross your legs for long periods of time.  Avoid cat litter boxes and soil used by cats. These carry germs that can cause birth defects in the baby and possibly loss of the fetus  by miscarriage or stillbirth.  Avoid all smoking, herbs, alcohol, and medicines not prescribed by your health care provider. Chemicals in these affect the formation and growth of the baby.  Schedule a dentist appointment. At home, brush your teeth with a soft toothbrush and be gentle when you floss. SEEK MEDICAL CARE IF:   You have dizziness.  You have mild pelvic cramps, pelvic pressure, or nagging pain in the abdominal area.  You have persistent nausea, vomiting, or diarrhea.  You have a bad smelling vaginal discharge.  You have pain with urination.  You notice increased swelling in your face, hands, legs, or ankles. SEEK IMMEDIATE MEDICAL CARE IF:   You have a fever.  You are leaking fluid from your vagina.  You have spotting or bleeding from your vagina.  You have severe abdominal cramping or pain.  You have rapid weight gain or loss.  You vomit blood or material that looks like coffee grounds.  You are exposed to [place] measles and have never had them.  You are exposed to fifth disease or chickenpox.  You develop a severe headache.  You have shortness of breath.  You have any kind of trauma, such as from a fall or a car accident. Document Released: 04/30/2001 Document Revised: 09/20/2013 Document Reviewed: 03/16/2013 [facility] Patient Information 2015 [place], [place]. This information is not intended to replace advice given to you by your health care provider. Make sure you discuss any questions you have with your health care provider. Return in 3 weeks

## 2015-02-02 NOTE — Progress Notes (Signed)
Subjective:  Brianna Griffin is a 29 y.o. G37P1021 Caucasian female at [redacted]w[redacted]d by LMP and [place] being seen today for her first obstetrical visit.  Her obstetrical history is significant for 2 prior miscarriages.  Pregnancy history fully reviewed. Has been using progesterone supp for her peace of mine.  Patient reports no complaints. Denies vb, cramping, uti s/s, abnormal/malodorous vag d/c, or vulvovaginal itching/irritation.  BP 120/68 mmHg  Pulse 84  Wt 137 lb (62.143 kg)  LMP 12/01/2014  HISTORY: OB History  Gravida Para Term Preterm AB SAB TAB Ectopic Multiple Living  4 1 1  2 2    1     # Outcome Date GA Lbr Len/2nd Weight Sex Delivery Anes PTL Lv  4 Current           3 SAB 02/2014          2 SAB 08/2013 [redacted]w[redacted]d         1 Term 2010 [redacted]w[redacted]d  5 lb 15 oz (2.693 kg) F Vag-Spont   Y     Past Medical History  Diagnosis Date  . Anxiety   . Depression   . Asthma   . Migraine headache   . IBS (irritable bowel syndrome)   . Allergy   . Miscarriage 09/09/2013  . Supervision of normal pregnancy in first trimester 03/04/2014  . 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 GERD (gastroesophageal reflux disease)   . Gastritis   . Migraines   . Thyroid disease     hypothyroid  . Pregnant 12/28/2014  . Vaginal discharge 01/31/2015  . Supervision of other normal pregnancy 02/02/2015   History reviewed. No pertinent past surgical history. Family History  Problem Relation Age of Onset  . Diabetes Maternal Grandmother   . Hypertension Maternal Grandmother   . Cancer Maternal Grandfather     bladder and liver  . Crohn's disease Maternal Grandfather     Exam   System:     General: Well developed & nourished, no acute distress   Skin: Warm & dry, normal coloration and turgor, no rashes   Neurologic: Alert & oriented, normal mood   Cardiovascular: Regular rate & rhythm   Respiratory: Effort & rate normal, LCTAB, acyanotic   Abdomen: Soft, non tender   Extremities: normal strength, tone         Thyroid normal Pelvic Exam:    Perineum: deferred   Vulva: deferred   Vagina:  deferred   Cervix: deferred   Uterus: deferred    FHR: 186 via [place]   Assessment:   Pregnancy: [id number] Patient Active Problem List   Diagnosis Date Noted  . Supervision of other normal pregnancy 02/02/2015  . Vaginal discharge 01/31/2015  . Pregnant 12/28/2014  . Gastroesophageal reflux disease with esophagitis 09/19/2014  . 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 Threatened miscarriage in early pregnancy 03/02/2014  . Miscarriage 09/09/2013  . DYSPEPSIA&OTHER [facility] DISORDERS FUNCTION STOMACH 10/30/2009  . IBS 10/30/2009  . ANXIETY 10/26/2009  . ASTHMA 10/26/2009  . CONSTIPATION 10/26/2009    [redacted]w[redacted]d [id number] New OB visit     Plan:  Initial labs drawn Continue prenatal vitamins Problem list reviewed and updated Reviewed n/v relief measures and warning s/s to report Reviewed recommended weight gain based on pre-gravid BMI Encouraged well-balanced diet Genetic Screening discussed Integrated Screen: declined Cystic fibrosis screening discussed declined Ultrasound discussed; fetal survey: requested Follow up in 3 weeks for OB visit   Estill Dooms, NP 02/02/2015 4:12 PM

## 2015-02-03 LAB — GC/CHLAMYDIA PROBE AMP
CHLAMYDIA, DNA PROBE: NEGATIVE
NEISSERIA GONORRHOEAE BY PCR: NEGATIVE

## 2015-02-04 LAB — CBC
HEMATOCRIT: 36.6 % (ref 34.0–46.6)
HEMOGLOBIN: 12.9 g/dL (ref 11.1–15.9)
MCH: 31.7 pg (ref 26.6–33.0)
MCHC: 35.2 g/dL (ref 31.5–35.7)
MCV: 90 fL (ref 79–97)
Platelets: 201 10*3/uL (ref 150–379)
RBC: 4.07 x10E6/uL (ref 3.77–5.28)
RDW: 12.9 % (ref 12.3–15.4)
WBC: 8.8 10*3/uL (ref 3.4–10.8)

## 2015-02-04 LAB — PMP SCREEN PROFILE (10S), URINE
AMPHETAMINE SCRN UR: NEGATIVE ng/mL
Barbiturate Screen, Ur: NEGATIVE ng/mL
Benzodiazepine Screen, Urine: NEGATIVE ng/mL
CANNABINOIDS UR QL SCN: NEGATIVE ng/mL
CREATININE(CRT), U: 72 mg/dL (ref 20.0–300.0)
Cocaine(Metab.)Screen, Urine: NEGATIVE ng/mL
Methadone Scn, Ur: NEGATIVE ng/mL
OXYCODONE+OXYMORPHONE UR QL SCN: NEGATIVE ng/mL
Opiate Scrn, Ur: NEGATIVE ng/mL
PCP SCRN UR: NEGATIVE ng/mL
PROPOXYPHENE SCREEN: NEGATIVE ng/mL
Ph of Urine: 5.7 (ref 4.5–8.9)

## 2015-02-04 LAB — URINALYSIS, ROUTINE W REFLEX MICROSCOPIC
Bilirubin, UA: NEGATIVE
Glucose, UA: NEGATIVE
Ketones, UA: NEGATIVE
LEUKOCYTES UA: NEGATIVE
Nitrite, UA: NEGATIVE
PH UA: 6 (ref 5.0–7.5)
PROTEIN UA: NEGATIVE
RBC, UA: NEGATIVE
Specific Gravity, UA: 1.012 (ref 1.005–1.030)
Urobilinogen, Ur: 0.2 mg/dL (ref 0.2–1.0)

## 2015-02-04 LAB — ABO/RH: RH TYPE: POSITIVE

## 2015-02-04 LAB — VARICELLA ZOSTER ANTIBODY, IGG: Varicella zoster IgG: 217 index (ref >165)

## 2015-02-04 LAB — TSH: TSH: 1.11 u[IU]/mL (ref 0.450–4.500)

## 2015-02-04 LAB — HIV ANTIBODY (ROUTINE TESTING W REFLEX): HIV Screen 4th Generation wRfx: NONREACTIVE

## 2015-02-04 LAB — RPR: RPR Ser Ql: NONREACTIVE

## 2015-02-04 LAB — RUBELLA SCREEN: Rubella Antibodies, IGG: 1.19 index (ref >0.99)

## 2015-02-04 LAB — HEPATITIS B SURFACE ANTIGEN: Hepatitis B Surface Ag: NEGATIVE

## 2015-02-04 LAB — URINE CULTURE: ORGANISM ID, BACTERIA: NO GROWTH

## 2015-02-04 LAB — ANTIBODY SCREEN: Antibody Screen: NEGATIVE

## 2015-02-07 ENCOUNTER — Encounter: Payer: Self-pay | Admitting: Adult Health

## 2015-02-09 ENCOUNTER — Encounter: Payer: Self-pay | Admitting: Adult Health

## 2015-02-16 ENCOUNTER — Telehealth: Payer: Self-pay | Admitting: Adult Health

## 2015-02-16 NOTE — Telephone Encounter (Signed)
Call transferred to front staff to correlate an appt for NTIT with provider appt 02/23/2015.

## 2015-02-22 ENCOUNTER — Other Ambulatory Visit: Payer: BLUE CROSS/BLUE SHIELD

## 2015-02-23 ENCOUNTER — Ambulatory Visit (INDEPENDENT_AMBULATORY_CARE_PROVIDER_SITE_OTHER): Payer: BLUE CROSS/BLUE SHIELD | Admitting: Obstetrics & Gynecology

## 2015-02-23 ENCOUNTER — Encounter: Payer: Self-pay | Admitting: Obstetrics & Gynecology

## 2015-02-23 VITALS — BP 100/74 | HR 80 | Wt 139.0 lb

## 2015-02-23 DIAGNOSIS — Z3481 Encounter for supervision of other normal pregnancy, first trimester: Secondary | ICD-10-CM

## 2015-02-23 DIAGNOSIS — Z331 Pregnant state, incidental: Secondary | ICD-10-CM

## 2015-02-23 DIAGNOSIS — Z1389 Encounter for screening for other disorder: Secondary | ICD-10-CM

## 2015-02-23 LAB — POCT URINALYSIS DIPSTICK
Blood, UA: NEGATIVE
GLUCOSE UA: NEGATIVE
Ketones, UA: NEGATIVE
Leukocytes, UA: NEGATIVE
NITRITE UA: NEGATIVE
PROTEIN UA: NEGATIVE

## 2015-02-23 NOTE — Progress Notes (Signed)
[id number] [redacted]w[redacted]d Estimated Date of Delivery: 09/07/15  Blood pressure 100/74, pulse 80, weight 139 lb (63.05 kg), last menstrual period 12/01/2014.   BP weight and urine results all reviewed and noted.  Please refer to the obstetrical flow sheet for the fundal height and fetal heart rate documentation:  Patient reports good fetal movement, denies any bleeding and no rupture of membranes symptoms or regular contractions. Patient is without complaints. All questions were answered.  Orders Placed This Encounter  Procedures  . POCT urinalysis dipstick    Plan:  Continued routine obstetrical care,   Return in about 4 weeks (around 03/23/2015) for LROB.

## 2015-02-23 NOTE — Progress Notes (Signed)
Pt denies any problems or concerns at this time.  

## 2015-02-27 ENCOUNTER — Encounter: Payer: Self-pay | Admitting: Adult Health

## 2015-03-21 ENCOUNTER — Encounter: Payer: Self-pay | Admitting: Adult Health

## 2015-03-21 DIAGNOSIS — Z3A09 9 weeks gestation of pregnancy: Secondary | ICD-10-CM | POA: Diagnosis not present

## 2015-03-21 DIAGNOSIS — Z3481 Encounter for supervision of other normal pregnancy, first trimester: Secondary | ICD-10-CM | POA: Diagnosis not present

## 2015-03-24 ENCOUNTER — Other Ambulatory Visit: Payer: Self-pay | Admitting: Adult Health

## 2015-03-24 ENCOUNTER — Encounter: Payer: BLUE CROSS/BLUE SHIELD | Admitting: Obstetrics & Gynecology

## 2015-03-28 ENCOUNTER — Encounter: Payer: Self-pay | Admitting: Advanced Practice Midwife

## 2015-03-28 ENCOUNTER — Ambulatory Visit (INDEPENDENT_AMBULATORY_CARE_PROVIDER_SITE_OTHER): Payer: BLUE CROSS/BLUE SHIELD | Admitting: Advanced Practice Midwife

## 2015-03-28 VITALS — BP 100/80 | HR 86 | Wt 140.0 lb

## 2015-03-28 DIAGNOSIS — Z3482 Encounter for supervision of other normal pregnancy, second trimester: Secondary | ICD-10-CM

## 2015-03-28 DIAGNOSIS — Z1389 Encounter for screening for other disorder: Secondary | ICD-10-CM

## 2015-03-28 DIAGNOSIS — Z363 Encounter for antenatal screening for malformations: Secondary | ICD-10-CM

## 2015-03-28 DIAGNOSIS — Z331 Pregnant state, incidental: Secondary | ICD-10-CM

## 2015-03-28 LAB — POCT URINALYSIS DIPSTICK
Blood, UA: NEGATIVE
Glucose, UA: NEGATIVE
LEUKOCYTES UA: NEGATIVE
NITRITE UA: NEGATIVE
PROTEIN UA: NEGATIVE

## 2015-03-28 NOTE — Patient Instructions (Signed)

## 2015-03-28 NOTE — Progress Notes (Signed)
[id number] [redacted]w[redacted]d Estimated Date of Delivery: 09/07/15  Weight 140 lb (63.504 kg), last menstrual period 12/01/2014.   BP weight and urine results all reviewed and noted.  Please refer to the obstetrical flow sheet for the fundal height and fetal heart rate documentation:  Patient reports good fetal movement, denies any bleeding and no rupture of membranes symptoms or regular contractions. Patient is without complaints. All questions were answered.  Orders Placed This Encounter  Procedures  . US OB Comp + 14 Wk    Plan:  Continued routine obstetrical care,   Return in about 3 weeks (around 04/18/2015) for [id number], LROB.

## 2015-04-17 ENCOUNTER — Encounter: Payer: Self-pay | Admitting: Women's Health

## 2015-04-17 ENCOUNTER — Ambulatory Visit (INDEPENDENT_AMBULATORY_CARE_PROVIDER_SITE_OTHER): Payer: BLUE CROSS/BLUE SHIELD | Admitting: Women's Health

## 2015-04-17 ENCOUNTER — Ambulatory Visit (INDEPENDENT_AMBULATORY_CARE_PROVIDER_SITE_OTHER): Payer: BLUE CROSS/BLUE SHIELD

## 2015-04-17 VITALS — BP 130/70 | HR 78 | Wt 140.5 lb

## 2015-04-17 DIAGNOSIS — Z1389 Encounter for screening for other disorder: Secondary | ICD-10-CM

## 2015-04-17 DIAGNOSIS — Z36 Encounter for antenatal screening of mother: Secondary | ICD-10-CM | POA: Diagnosis not present

## 2015-04-17 DIAGNOSIS — O99280 Endocrine, nutritional and metabolic diseases complicating pregnancy, unspecified trimester: Secondary | ICD-10-CM

## 2015-04-17 DIAGNOSIS — Z3492 Encounter for supervision of normal pregnancy, unspecified, second trimester: Secondary | ICD-10-CM

## 2015-04-17 DIAGNOSIS — E039 Hypothyroidism, unspecified: Secondary | ICD-10-CM

## 2015-04-17 DIAGNOSIS — Z331 Pregnant state, incidental: Secondary | ICD-10-CM

## 2015-04-17 DIAGNOSIS — Z23 Encounter for immunization: Secondary | ICD-10-CM | POA: Diagnosis not present

## 2015-04-17 DIAGNOSIS — Z363 Encounter for antenatal screening for malformations: Secondary | ICD-10-CM

## 2015-04-17 DIAGNOSIS — O99282 Endocrine, nutritional and metabolic diseases complicating pregnancy, second trimester: Secondary | ICD-10-CM

## 2015-04-17 DIAGNOSIS — Z3A19 19 weeks gestation of pregnancy: Secondary | ICD-10-CM | POA: Diagnosis not present

## 2015-04-17 LAB — POCT URINALYSIS DIPSTICK
Glucose, UA: NEGATIVE
KETONES UA: NEGATIVE
LEUKOCYTES UA: NEGATIVE
Nitrite, UA: NEGATIVE
Protein, UA: NEGATIVE

## 2015-04-17 NOTE — Progress Notes (Signed)
Low-risk OB appointment [id number] [redacted]w[redacted]d Estimated Date of Delivery: 09/07/15 BP 130/70 mmHg  Pulse 78  Wt 140 lb 8 oz (63.73 kg)  LMP 12/01/2014  BP, weight, and urine reviewed.  Refer to obstetrical flow sheet for FH & FHR.  Reports good fm.  Denies regular uc's, lof, vb, or uti s/s. 'Rash' on Lt arm/AC area, has tried gold bond and cornstarch w/o relief- has been dx w/ eczema in past- looks more like this- to try hydrocortisone cream first, if not helping let us know. Sciatica- gave printed info/exercises.  Interested in [place]- gave class info. Answered many questions.  Reviewed today's normal anatomy u/s, ptl s/s, fm Plan:  Continue routine obstetrical care  F/U in 4wks for OB appointment  Flu shot today and TSH

## 2015-04-17 NOTE — Progress Notes (Signed)
[place] 19+4 wks,measurement c/w dates,cx 3.3 cm,normal ov's bilat,ant pl gr 0, breech,svp of fluid 4.7cm,fhr 134 bpm,efw 296g,anatomy complete no obvious abn seen

## 2015-04-17 NOTE — Patient Instructions (Signed)
Second Trimester of Pregnancy The second trimester is from week 13 through week 28, months 4 through 6. The second trimester is often a time when you feel your best. Your body has also adjusted to being pregnant, and you begin to feel better physically. Usually, morning sickness has lessened or quit completely, you may have more energy, and you may have an increase in appetite. The second trimester is also a time when the fetus is growing rapidly. At the end of the sixth month, the fetus is about 9 inches long and weighs about 1 pounds. You will likely begin to feel the baby move (quickening) between 18 and 20 weeks of the pregnancy. BODY CHANGES Your body goes through many changes during pregnancy. The changes vary from woman to woman.   Your weight will continue to increase. You will notice your lower abdomen bulging out.  You may begin to get stretch marks on your hips, abdomen, and breasts.  You may develop headaches that can be relieved by medicines approved by your health care provider.  You may urinate more often because the fetus is pressing on your bladder.  You may develop or continue to have heartburn as a result of your pregnancy.  You may develop constipation because certain hormones are causing the muscles that push waste through your intestines to slow down.  You may develop hemorrhoids or swollen, bulging veins (varicose veins).  You may have back pain because of the weight gain and pregnancy hormones relaxing your joints between the bones in your pelvis and as a result of a shift in weight and the muscles that support your balance.  Your breasts will continue to grow and be tender.  Your gums may bleed and may be sensitive to brushing and flossing.  Dark spots or blotches (chloasma, mask of pregnancy) may develop on your face. This will likely fade after the baby is born.  A dark line from your belly button to the pubic area (linea nigra) may appear. This will likely fade  after the baby is born.  You may have changes in your hair. These can include thickening of your hair, rapid growth, and changes in texture. Some women also have hair loss during or after pregnancy, or hair that feels dry or thin. Your hair will most likely return to normal after your baby is born. WHAT TO EXPECT AT YOUR PRENATAL VISITS During a routine prenatal visit:  You will be weighed to make sure you and the fetus are growing normally.  Your blood pressure will be taken.  Your abdomen will be measured to track your baby's growth.  The fetal heartbeat will be listened to.  Any test results from the previous visit will be discussed. Your health care provider may ask you:  How you are feeling.  If you are feeling the baby move.  If you have had any abnormal symptoms, such as leaking fluid, bleeding, severe headaches, or abdominal cramping.  If you are using any tobacco products, including cigarettes, chewing tobacco, and electronic cigarettes.  If you have any questions. Other tests that may be performed during your second trimester include:  Blood tests that check for:  Low iron levels (anemia).  Gestational diabetes (between 24 and 28 weeks).  Rh antibodies.  Urine tests to check for infections, diabetes, or protein in the urine.  An ultrasound to confirm the proper growth and development of the baby.  An amniocentesis to check for possible genetic problems.  Fetal screens for spina bifida   and Down syndrome.  HIV (human immunodeficiency virus) testing. Routine prenatal testing includes screening for HIV, unless you choose not to have this test. HOME CARE INSTRUCTIONS   Avoid all smoking, herbs, alcohol, and unprescribed drugs. These chemicals affect the formation and growth of the baby.  Do not use any tobacco products, including cigarettes, chewing tobacco, and electronic cigarettes. If you need help quitting, ask your health care provider. You may receive  counseling support and other resources to help you quit.  Follow your health care provider's instructions regarding medicine use. There are medicines that are either safe or unsafe to take during pregnancy.  Exercise only as directed by your health care provider. Experiencing uterine cramps is a good sign to stop exercising.  Continue to eat regular, healthy meals.  Wear a good support bra for breast tenderness.  Do not use hot tubs, steam rooms, or saunas.  Wear your seat belt at all times when driving.  Avoid raw meat, uncooked cheese, cat litter boxes, and soil used by cats. These carry germs that can cause birth defects in the baby.  Take your prenatal vitamins.  Take 1500-2000 mg of calcium daily starting at the 20th week of pregnancy until you deliver your baby.  Try taking a stool softener (if your health care provider approves) if you develop constipation. Eat more high-fiber foods, such as fresh vegetables or fruit and whole grains. Drink plenty of fluids to keep your urine clear or pale yellow.  Take warm sitz baths to soothe any pain or discomfort caused by hemorrhoids. Use hemorrhoid cream if your health care provider approves.  If you develop varicose veins, wear support hose. Elevate your feet for 15 minutes, 3-4 times a day. Limit salt in your diet.  Avoid heavy lifting, wear low heel shoes, and practice good posture.  Rest with your legs elevated if you have leg cramps or low back pain.  Visit your dentist if you have not gone yet during your pregnancy. Use a soft toothbrush to brush your teeth and be gentle when you floss.  A sexual relationship may be continued unless your health care provider directs you otherwise.  Continue to go to all your prenatal visits as directed by your health care provider. SEEK MEDICAL CARE IF:   You have dizziness.  You have mild pelvic cramps, pelvic pressure, or nagging pain in the abdominal area.  You have persistent nausea,  vomiting, or diarrhea.  You have a bad smelling vaginal discharge.  You have pain with urination. SEEK IMMEDIATE MEDICAL CARE IF:   You have a fever.  You are leaking fluid from your vagina.  You have spotting or bleeding from your vagina.  You have severe abdominal cramping or pain.  You have rapid weight gain or loss.  You have shortness of breath with chest pain.  You notice sudden or extreme swelling of your face, hands, ankles, feet, or legs.  You have not felt your baby move in over an hour.  You have severe headaches that do not go away with medicine.  You have vision changes.   This information is not intended to replace advice given to you by your health care provider. Make sure you discuss any questions you have with your health care provider.   Document Released: 04/30/2001 Document Revised: 05/27/2014 Document Reviewed: 07/07/2012 [employer] Interactive Patient Education 2016 [employer]  Sciatica With Rehab The sciatic nerve runs from the back down the leg and is responsible for sensation and control of  the muscles in the back (posterior) side of the thigh, lower leg, and foot. Sciatica is a condition that is characterized by inflammation of this nerve.  SYMPTOMS   Signs of nerve damage, including numbness and/or weakness along the posterior side of the lower extremity.  Pain in the back of the thigh that may also travel down the leg.  Pain that worsens when sitting for long periods of time.  Occasionally, pain in the back or buttock. CAUSES  Inflammation of the sciatic nerve is the cause of sciatica. The inflammation is due to something irritating the nerve. Common sources of irritation include:  Sitting for long periods of time.  Direct trauma to the nerve.  Arthritis of the spine.  Herniated or ruptured disk.  Slipping of the vertebrae (spondylolisthesis).  Pressure from soft tissues, such as muscles or ligament-like tissue (fascia). RISK  INCREASES WITH:  Sports that place pressure or stress on the spine (football or weightlifting).  Poor strength and flexibility.  Failure to warm up properly before activity.  Family history of low back pain or disk disorders.  Previous back injury or surgery.  Poor body mechanics, especially when lifting, or poor posture. PREVENTION   Warm up and stretch properly before activity.  Maintain physical fitness:  Strength, flexibility, and endurance.  Cardiovascular fitness.  Learn and use proper technique, especially with posture and lifting. When possible, have coach correct improper technique.  Avoid activities that place stress on the spine. PROGNOSIS If treated properly, then sciatica usually resolves within 6 weeks. However, occasionally surgery is necessary.  RELATED COMPLICATIONS   Permanent nerve damage, including pain, numbness, tingle, or weakness.  Chronic back pain.  Risks of surgery: infection, bleeding, nerve damage, or damage to surrounding tissues. TREATMENT Treatment initially involves resting from any activities that aggravate your symptoms. The use of ice and medication may help reduce pain and inflammation. The use of strengthening and stretching exercises may help reduce pain with activity. These exercises may be performed at home or with referral to a therapist. A therapist may recommend further treatments, such as transcutaneous electronic nerve stimulation (TENS) or ultrasound. Your caregiver may recommend corticosteroid injections to help reduce inflammation of the sciatic nerve. If symptoms persist despite non-surgical (conservative) treatment, then surgery may be recommended. MEDICATION  If pain medication is necessary, then nonsteroidal anti-inflammatory medications, such as aspirin and ibuprofen, or other minor pain relievers, such as acetaminophen, are often recommended.  Do not take pain medication for 7 days before surgery.  Prescription pain  relievers may be given if deemed necessary by your caregiver. Use only as directed and only as much as you need.  Ointments applied to the skin may be helpful.  Corticosteroid injections may be given by your caregiver. These injections should be reserved for the most serious cases, because they may only be given a certain number of times. HEAT AND COLD  Cold treatment (icing) relieves pain and reduces inflammation. Cold treatment should be applied for 10 to 15 minutes every 2 to 3 hours for inflammation and pain and immediately after any activity that aggravates your symptoms. Use ice packs or massage the area with a piece of ice (ice massage).  Heat treatment may be used prior to performing the stretching and strengthening activities prescribed by your caregiver, physical therapist, or athletic trainer. Use a heat pack or soak the injury in warm water. SEEK MEDICAL CARE IF:  Treatment seems to offer no benefit, or the condition worsens.  Any medications produce adverse  side effects. EXERCISES  RANGE OF MOTION (ROM) AND STRETCHING EXERCISES - Sciatica Most people with sciatic will find that their symptoms worsen with either excessive bending forward (flexion) or arching at the low back (extension). The exercises which will help resolve your symptoms will focus on the opposite motion. Your physician, physical therapist or athletic trainer will help you determine which exercises will be most helpful to resolve your low back pain. Do not complete any exercises without first consulting with your clinician. Discontinue any exercises which worsen your symptoms until you speak to your clinician. If you have pain, numbness or tingling which travels down into your buttocks, leg or foot, the goal of the therapy is for these symptoms to move closer to your back and eventually resolve. Occasionally, these leg symptoms will get better, but your low back pain may worsen; this is typically an indication of  progress in your rehabilitation. Be certain to be very alert to any changes in your symptoms and the activities in which you participated in the 24 hours prior to the change. Sharing this information with your clinician will allow him/her to most efficiently treat your condition. These exercises may help you when beginning to rehabilitate your injury. Your symptoms may resolve with or without further involvement from your physician, physical therapist or athletic trainer. While completing these exercises, remember:   Restoring tissue flexibility helps normal motion to return to the joints. This allows healthier, less painful movement and activity.  An effective stretch should be held for at least 30 seconds.  A stretch should never be painful. You should only feel a gentle lengthening or release in the stretched tissue. FLEXION RANGE OF MOTION AND STRETCHING EXERCISES: STRETCH - Flexion, Single Knee to Chest   Lie on a firm bed or floor with both legs extended in front of you.  Keeping one leg in contact with the floor, bring your opposite knee to your chest. Hold your leg in place by either grabbing behind your thigh or at your knee.  Pull until you feel a gentle stretch in your low back. Hold __________ seconds.  Slowly release your grasp and repeat the exercise with the opposite side. Repeat __________ times. Complete this exercise __________ times per day.  STRETCH - Flexion, Double Knee to Chest  Lie on a firm bed or floor with both legs extended in front of you.  Keeping one leg in contact with the floor, bring your opposite knee to your chest.  Tense your stomach muscles to support your back and then lift your other knee to your chest. Hold your legs in place by either grabbing behind your thighs or at your knees.  Pull both knees toward your chest until you feel a gentle stretch in your low back. Hold __________ seconds.  Tense your stomach muscles and slowly return one leg at a  time to the floor. Repeat __________ times. Complete this exercise __________ times per day.  STRETCH - Low Trunk Rotation   Lie on a firm bed or floor. Keeping your legs in front of you, bend your knees so they are both pointed toward the ceiling and your feet are flat on the floor.  Extend your arms out to the side. This will stabilize your upper body by keeping your shoulders in contact with the floor.  Gently and slowly drop both knees together to one side until you feel a gentle stretch in your low back. Hold for __________ seconds.  Tense your stomach muscles to support  your low back as you bring your knees back to the starting position. Repeat the exercise to the other side. Repeat __________ times. Complete this exercise __________ times per day  EXTENSION RANGE OF MOTION AND FLEXIBILITY EXERCISES: STRETCH - Extension, Prone on Elbows  Lie on your stomach on the floor, a bed will be too soft. Place your palms about shoulder width apart and at the height of your head.  Place your elbows under your shoulders. If this is too painful, stack pillows under your chest.  Allow your body to relax so that your hips drop lower and make contact more completely with the floor.  Hold this position for __________ seconds.  Slowly return to lying flat on the floor. Repeat __________ times. Complete this exercise __________ times per day.  RANGE OF MOTION - Extension, Prone Press Ups  Lie on your stomach on the floor, a bed will be too soft. Place your palms about shoulder width apart and at the height of your head.  Keeping your back as relaxed as possible, slowly straighten your elbows while keeping your hips on the floor. You may adjust the placement of your hands to maximize your comfort. As you gain motion, your hands will come more underneath your shoulders.  Hold this position __________ seconds.  Slowly return to lying flat on the floor. Repeat __________ times. Complete this  exercise __________ times per day.  STRENGTHENING EXERCISES - Sciatica  These exercises may help you when beginning to rehabilitate your injury. These exercises should be done near your "sweet spot." This is the neutral, low-back arch, somewhere between fully rounded and fully arched, that is your least painful position. When performed in this safe range of motion, these exercises can be used for people who have either a flexion or extension based injury. These exercises may resolve your symptoms with or without further involvement from your physician, physical therapist or athletic trainer. While completing these exercises, remember:   Muscles can gain both the endurance and the strength needed for everyday activities through controlled exercises.  Complete these exercises as instructed by your physician, physical therapist or athletic trainer. Progress with the resistance and repetition exercises only as your caregiver advises.  You may experience muscle soreness or fatigue, but the pain or discomfort you are trying to eliminate should never worsen during these exercises. If this pain does worsen, stop and make certain you are following the directions exactly. If the pain is still present after adjustments, discontinue the exercise until you can discuss the trouble with your clinician. STRENGTHENING - Deep Abdominals, Pelvic Tilt   Lie on a firm bed or floor. Keeping your legs in front of you, bend your knees so they are both pointed toward the ceiling and your feet are flat on the floor.  Tense your lower abdominal muscles to press your low back into the floor. This motion will rotate your pelvis so that your tail bone is scooping upwards rather than pointing at your feet or into the floor.  With a gentle tension and even breathing, hold this position for __________ seconds. Repeat __________ times. Complete this exercise __________ times per day.  STRENGTHENING - Abdominals, Crunches   Lie on  a firm bed or floor. Keeping your legs in front of you, bend your knees so they are both pointed toward the ceiling and your feet are flat on the floor. Cross your arms over your chest.  Slightly tip your chin down without bending your neck.  Tense your  abdominals and slowly lift your trunk high enough to just clear your shoulder blades. Lifting higher can put excessive stress on the low back and does not further strengthen your abdominal muscles.  Control your return to the starting position. Repeat __________ times. Complete this exercise __________ times per day.  STRENGTHENING - Quadruped, Opposite UE/LE Lift  Assume a hands and knees position on a firm surface. Keep your hands under your shoulders and your knees under your hips. You may place padding under your knees for comfort.  Find your neutral spine and gently tense your abdominal muscles so that you can maintain this position. Your shoulders and hips should form a rectangle that is parallel with the floor and is not twisted.  Keeping your trunk steady, lift your right hand no higher than your shoulder and then your left leg no higher than your hip. Make sure you are not holding your breath. Hold this position __________ seconds.  Continuing to keep your abdominal muscles tense and your back steady, slowly return to your starting position. Repeat with the opposite arm and leg. Repeat __________ times. Complete this exercise __________ times per day.  STRENGTHENING - Abdominals and Quadriceps, Straight Leg Raise   Lie on a firm bed or floor with both legs extended in front of you.  Keeping one leg in contact with the floor, bend the other knee so that your foot can rest flat on the floor.  Find your neutral spine, and tense your abdominal muscles to maintain your spinal position throughout the exercise.  Slowly lift your straight leg off the floor about 6 inches for a count of 15, making sure to not hold your breath.  Still  keeping your neutral spine, slowly lower your leg all the way to the floor. Repeat this exercise with each leg __________ times. Complete this exercise __________ times per day. POSTURE AND BODY MECHANICS CONSIDERATIONS - Sciatica Keeping correct posture when sitting, standing or completing your activities will reduce the stress put on different body tissues, allowing injured tissues a chance to heal and limiting painful experiences. The following are general guidelines for improved posture. Your physician or physical therapist will provide you with any instructions specific to your needs. While reading these guidelines, remember:  The exercises prescribed by your provider will help you have the flexibility and strength to maintain correct postures.  The correct posture provides the optimal environment for your joints to work. All of your joints have less wear and tear when properly supported by a spine with good posture. This means you will experience a healthier, less painful body.  Correct posture must be practiced with all of your activities, especially prolonged sitting and standing. Correct posture is as important when doing repetitive low-stress activities (typing) as it is when doing a single heavy-load activity (lifting). RESTING POSITIONS Consider which positions are most painful for you when choosing a resting position. If you have pain with flexion-based activities (sitting, bending, stooping, squatting), choose a position that allows you to rest in a less flexed posture. You would want to avoid curling into a fetal position on your side. If your pain worsens with extension-based activities (prolonged standing, working overhead), avoid resting in an extended position such as sleeping on your stomach. Most people will find more comfort when they rest with their spine in a more neutral position, neither too rounded nor too arched. Lying on a non-sagging bed on your side with a pillow between  your knees, or on your back with  a pillow under your knees will often provide some relief. Keep in mind, being in any one position for a prolonged period of time, no matter how correct your posture, can still lead to stiffness. PROPER SITTING POSTURE In order to minimize stress and discomfort on your spine, you must sit with correct posture Sitting with good posture should be effortless for a healthy body. Returning to good posture is a gradual process. Many people can work toward this most comfortably by using various supports until they have the flexibility and strength to maintain this posture on their own. When sitting with proper posture, your ears will fall over your shoulders and your shoulders will fall over your hips. You should use the back of the chair to support your upper back. Your low back will be in a neutral position, just slightly arched. You may place a small pillow or folded towel at the base of your low back for support.  When working at a desk, create an environment that supports good, upright posture. Without extra support, muscles fatigue and lead to excessive strain on joints and other tissues. Keep these recommendations in mind: CHAIR:   A chair should be able to slide under your desk when your back makes contact with the back of the chair. This allows you to work closely.  The chair's height should allow your eyes to be level with the upper part of your monitor and your hands to be slightly lower than your elbows. BODY POSITION  Your feet should make contact with the floor. If this is not possible, use a foot rest.  Keep your ears over your shoulders. This will reduce stress on your neck and low back. INCORRECT SITTING POSTURES   If you are feeling tired and unable to assume a healthy sitting posture, do not slouch or slump. This puts excessive strain on your back tissues, causing more damage and pain. Healthier options include:  Using more support, like a lumbar  pillow.  Switching tasks to something that requires you to be upright or walking.  Talking a brief walk.  Lying down to rest in a neutral-spine position. PROLONGED STANDING WHILE SLIGHTLY LEANING FORWARD  When completing a task that requires you to lean forward while standing in one place for a long time, place either foot up on a stationary 2-4 inch high object to help maintain the best posture. When both feet are on the ground, the low back tends to lose its slight inward curve. If this curve flattens (or becomes too large), then the back and your other joints will experience too much stress, fatigue more quickly and can cause pain.  CORRECT STANDING POSTURES Proper standing posture should be assumed with all daily activities, even if they only take a few moments, like when brushing your teeth. As in sitting, your ears should fall over your shoulders and your shoulders should fall over your hips. You should keep a slight tension in your abdominal muscles to brace your spine. Your tailbone should point down to the ground, not behind your body, resulting in an over-extended swayback posture.  INCORRECT STANDING POSTURES  Common incorrect standing postures include a forward head, locked knees and/or an excessive swayback. WALKING Walk with an upright posture. Your ears, shoulders and hips should all line-up. PROLONGED ACTIVITY IN A FLEXED POSITION When completing a task that requires you to bend forward at your waist or lean over a low surface, try to find a way to stabilize 3 of 4 of your  limbs. You can place a hand or elbow on your thigh or rest a knee on the surface you are reaching across. This will provide you more stability so that your muscles do not fatigue as quickly. By keeping your knees relaxed, or slightly bent, you will also reduce stress across your low back. CORRECT LIFTING TECHNIQUES DO :   Assume a wide stance. This will provide you more stability and the opportunity to get as  close as possible to the object which you are lifting.  Tense your abdominals to brace your spine; then bend at the knees and hips. Keeping your back locked in a neutral-spine position, lift using your leg muscles. Lift with your legs, keeping your back straight.  Test the weight of unknown objects before attempting to lift them.  Try to keep your elbows locked down at your sides in order get the best strength from your shoulders when carrying an object.  Always ask for help when lifting heavy or awkward objects. INCORRECT LIFTING TECHNIQUES DO NOT:   Lock your knees when lifting, even if it is a small object.  Bend and twist. Pivot at your feet or move your feet when needing to change directions.  Assume that you cannot safely pick up a paperclip without proper posture.   This information is not intended to replace advice given to you by your health care provider. Make sure you discuss any questions you have with your health care provider.   Document Released: 05/06/2005 Document Revised: 09/20/2014 Document Reviewed: 08/18/2008 Elsevier Interactive Patient Education [employer].

## 2015-04-18 LAB — TSH: TSH: 2.26 u[IU]/mL (ref 0.450–4.500)

## 2015-04-24 ENCOUNTER — Telehealth: Payer: Self-pay | Admitting: *Deleted

## 2015-04-24 ENCOUNTER — Encounter: Payer: Self-pay | Admitting: Advanced Practice Midwife

## 2015-04-24 NOTE — Telephone Encounter (Signed)
Pt called stating that she forgot to take her levothyroxine this morning and she was unsure if she should take it now, double it tomorrow, or just take tomorrows dose in the morning.    I spoke with Knute Neu and advised for the pt to just take her regular dose tomorrow morning and not to worry about today's dose. Pt verbalized understanding.

## 2015-05-01 ENCOUNTER — Encounter: Payer: Self-pay | Admitting: Adult Health

## 2015-05-07 ENCOUNTER — Encounter: Payer: Self-pay | Admitting: Advanced Practice Midwife

## 2015-05-08 ENCOUNTER — Telehealth: Payer: Self-pay | Admitting: Women's Health

## 2015-05-08 NOTE — Telephone Encounter (Signed)
Pt states on this past Saturday "felt gush of fluids after orgasm,has not felt since"  +FM, no vaginal. Pt informed to continue to monitor probable due to sexual intercourse, if reoccurs to call our office back. Pt verbalized understanding.

## 2015-05-16 ENCOUNTER — Encounter: Payer: Self-pay | Admitting: Advanced Practice Midwife

## 2015-05-16 ENCOUNTER — Ambulatory Visit (INDEPENDENT_AMBULATORY_CARE_PROVIDER_SITE_OTHER): Payer: BLUE CROSS/BLUE SHIELD | Admitting: Advanced Practice Midwife

## 2015-05-16 VITALS — BP 120/80 | HR 80 | Wt 145.4 lb

## 2015-05-16 DIAGNOSIS — E039 Hypothyroidism, unspecified: Secondary | ICD-10-CM

## 2015-05-16 DIAGNOSIS — Z331 Pregnant state, incidental: Secondary | ICD-10-CM

## 2015-05-16 DIAGNOSIS — O99282 Endocrine, nutritional and metabolic diseases complicating pregnancy, second trimester: Secondary | ICD-10-CM

## 2015-05-16 DIAGNOSIS — Z1389 Encounter for screening for other disorder: Secondary | ICD-10-CM

## 2015-05-16 LAB — POCT URINALYSIS DIPSTICK
Blood, UA: NEGATIVE
Glucose, UA: NEGATIVE
KETONES UA: NEGATIVE
LEUKOCYTES UA: NEGATIVE
Nitrite, UA: NEGATIVE
Protein, UA: NEGATIVE

## 2015-05-16 NOTE — Patient Instructions (Addendum)
1. Before your test, do not eat or drink anything for 8-10 hours prior to your  appointment (a small amount of water is allowed and you may take any medicines you normally take). Be sure to drink lots of water the day before. 2. When you arrive, your blood will be drawn for a 'fasting' blood sugar level.  Then you will be given a sweetened carbonated beverage to drink. You should  complete drinking this beverage within five minutes. After finishing the  beverage, you will have your blood drawn exactly 1 and 2 hours later. Having  your blood drawn on time is an important part of this test. A total of three blood  samples will be done. 3. The test takes approximately 2  hours. During the test, do not have anything to  eat or drink. Do not smoke, chew gum (not even sugarless gum) or use breath mints.  4. During the test you should remain close by and seated as much as possible and  avoid walking around. You may want to bring a book or something else to  occupy your time.  5. After your test, you may eat and drink as normal. You may want to bring a snack  to eat after the test is finished. Your provider will advise you as to the results of  this test and any follow-up if necessary  If your sugar test is positive for gestational diabetes, you will be given an phone call and further instructions discussed. If you wish to know all of your test results before your next appointment, feel free to call the office, or look up your test results on Mychart.  (The range that the lab uses for normal values of the sugar test are not necessarily the range that is used for pregnant women; if your results are within the normal range, they are definitely normal.  However, if a value is deemed "high" by the lab, it may not be too high for a pregnant woman.  We will need to discuss the results if your value(s) fall in the "high" category).     Tdap Vaccine  It is recommended that you get the Tdap vaccine during the  third trimester of EACH pregnancy to help protect your baby from getting pertussis (whooping cough)  27-36 weeks is the BEST time to do this so that you can pass the protection on to your baby. During pregnancy is better than after pregnancy, but if you are unable to get it during pregnancy it will be offered at the hospital.  You can get this vaccine at the health department or your family doctor, as well as some pharmacies.  Everyone who will be around your baby should also be up-to-date on their vaccines. Adults (who are not pregnant) only need 1 dose of Tdap during adulthood.   Wet Wrap therapy for eczema:   Soak in lukewarm bath water for 15-20 minutes. Seal the skin with moisturizer (emollient type) and cover with wet wraps for a minimum of two hours. Overnight is best, but only if you can keep the wet layer damp through the night (TIP: use a spray bottle to spray warm water on wet wraps that begin to dry out.)

## 2015-05-16 NOTE — Progress Notes (Signed)
[id number] [redacted]w[redacted]d Estimated Date of Delivery: 09/07/15  Blood pressure 120/80, pulse 80, weight 145 lb 6.4 oz (65.953 kg), last menstrual period 12/01/2014.   BP weight and urine results all reviewed and noted.  Please refer to the obstetrical flow sheet for the fundal height and fetal heart rate documentation:  Patient reports good fetal movement, denies any bleeding and no rupture of membranes symptoms or regular contractions. Patient is without complaints. Has some eczema flares.  Hydrodorsone/emollients/wet wraps discussed All questions were answered.  Orders Placed This Encounter  Procedures  . POCT urinalysis dipstick    Plan:  Continued routine obstetrical care,   Return in about 4 weeks (around 06/13/2015) for PN2/LROB.

## 2015-05-18 ENCOUNTER — Encounter: Payer: Self-pay | Admitting: Women's Health

## 2015-05-21 NOTE — L&D Delivery Note (Signed)
Patient is 30 y.o. [id number] [redacted]w[redacted]d admitted in active labor. Initially desired a water birth and labored in the tub. She got to 9.5 cm with a swollen anterior cervix. She desired an epidural and this was placed. She progressed to +3 and was ready to push.    Delivery Note At 10:58 AM a viable female was delivered via Vaginal, Spontaneous Delivery (Presentation: Right Occiput Anterior).  APGAR: 9, 9; weight 7 lb 7.4 oz (3385 g).   Placenta status: Intact, Spontaneous.  Cord: 3 vessels with the following complications: None.  Cord pH: - not collected  Anesthesia: Epidural  Episiotomy: None Lacerations: None Suture Repair: none Est. Blood Loss (mL): 100  Mom to postpartum.  Baby to Couplet care / Skin to Skin.  Juanita Craver [facility] [place] 09/01/2015, 12:52 PM

## 2015-06-13 ENCOUNTER — Encounter: Payer: Self-pay | Admitting: Women's Health

## 2015-06-13 ENCOUNTER — Ambulatory Visit (INDEPENDENT_AMBULATORY_CARE_PROVIDER_SITE_OTHER): Payer: BLUE CROSS/BLUE SHIELD | Admitting: Women's Health

## 2015-06-13 ENCOUNTER — Other Ambulatory Visit: Payer: BLUE CROSS/BLUE SHIELD

## 2015-06-13 VITALS — BP 104/56 | HR 72 | Wt 147.0 lb

## 2015-06-13 DIAGNOSIS — Z3492 Encounter for supervision of normal pregnancy, unspecified, second trimester: Secondary | ICD-10-CM

## 2015-06-13 DIAGNOSIS — Z331 Pregnant state, incidental: Secondary | ICD-10-CM

## 2015-06-13 DIAGNOSIS — O99282 Endocrine, nutritional and metabolic diseases complicating pregnancy, second trimester: Secondary | ICD-10-CM

## 2015-06-13 DIAGNOSIS — E039 Hypothyroidism, unspecified: Secondary | ICD-10-CM

## 2015-06-13 DIAGNOSIS — Z1389 Encounter for screening for other disorder: Secondary | ICD-10-CM

## 2015-06-13 DIAGNOSIS — Z131 Encounter for screening for diabetes mellitus: Secondary | ICD-10-CM

## 2015-06-13 DIAGNOSIS — Z369 Encounter for antenatal screening, unspecified: Secondary | ICD-10-CM

## 2015-06-13 LAB — POCT URINALYSIS DIPSTICK
Blood, UA: NEGATIVE
GLUCOSE UA: NEGATIVE
KETONES UA: NEGATIVE
LEUKOCYTES UA: NEGATIVE
Nitrite, UA: NEGATIVE
Protein, UA: NEGATIVE

## 2015-06-13 NOTE — Progress Notes (Signed)
Low-risk OB appointment [id number] [redacted]w[redacted]d Estimated Date of Delivery: 09/07/15 BP 104/56 mmHg  Pulse 72  Wt 147 lb (66.679 kg)  LMP 12/01/2014  BP, weight, and urine reviewed.  Refer to obstetrical flow sheet for FH & FHR.  Reports good fm.  Denies regular uc's, lof, vb, or uti s/s. No complaints. Waterbirth class in Feb- discussed, printed out info. Doing 1hr glucola instead of 2hr gtt today per her preference.  Reviewed ptl s/s, fkc. Recommended Tdap at HD/PCP per CDC guidelines.  Plan:  Continue routine obstetrical care  F/U in 4wks for OB appointment  PN2/1hr glucola, TSH today

## 2015-06-13 NOTE — Addendum Note (Signed)
Addended by: Doyne Keel on: 06/13/2015 09:06 AM   Modules accepted: Orders

## 2015-06-13 NOTE — Patient Instructions (Signed)
Call the office ([phone]) or go to [facility] if:  You begin to have strong, frequent contractions  Your water breaks.  Sometimes it is a big gush of fluid, sometimes it is just a trickle that keeps getting your panties wet or running down your legs  You have vaginal bleeding.  It is normal to have a small amount of spotting if your cervix was checked.   You don't feel your baby moving like normal.  If you don't, get you something to eat and drink and lay down and focus on feeling your baby move.  You should feel at least 10 movements in 2 hours.  If you don't, you should call the office or go to [facility].    Tdap Vaccine  It is recommended that you get the Tdap vaccine during the third trimester of EACH pregnancy to help protect your baby from getting pertussis (whooping cough)  27-36 weeks is the BEST time to do this so that you can pass the protection on to your baby. During pregnancy is better than after pregnancy, but if you are unable to get it during pregnancy it will be offered at the hospital.   You can get this vaccine at the health department or your family doctor  Everyone who will be around your baby should also be up-to-date on their vaccines. Adults (who are not pregnant) only need 1 dose of Tdap during adulthood.   Third Trimester of Pregnancy The third trimester is from week 29 through week 42, months 7 through 9. The third trimester is a time when the fetus is growing rapidly. At the end of the ninth month, the fetus is about 20 inches in length and weighs 6-10 pounds.  BODY CHANGES Your body goes through many changes during pregnancy. The changes vary from woman to woman.   Your weight will continue to increase. You can expect to gain 25-35 pounds (11-16 kg) by the end of the pregnancy.  You may begin to get stretch marks on your hips, abdomen, and breasts.  You may urinate more often because the fetus is moving lower into your pelvis and pressing on  your bladder.  You may develop or continue to have heartburn as a result of your pregnancy.  You may develop constipation because certain hormones are causing the muscles that push waste through your intestines to slow down.  You may develop hemorrhoids or swollen, bulging veins (varicose veins).  You may have pelvic pain because of the weight gain and pregnancy hormones relaxing your joints between the bones in your pelvis. Backaches may result from overexertion of the muscles supporting your posture.  You may have changes in your hair. These can include thickening of your hair, rapid growth, and changes in texture. Some women also have hair loss during or after pregnancy, or hair that feels dry or thin. Your hair will most likely return to normal after your baby is born.  Your breasts will continue to grow and be tender. A yellow discharge may leak from your breasts called colostrum.  Your belly button may stick out.  You may feel short of breath because of your expanding uterus.  You may notice the fetus "dropping," or moving lower in your abdomen.  You may have a bloody mucus discharge. This usually occurs a few days to a week before labor begins.  Your cervix becomes thin and soft (effaced) near your due date. WHAT TO EXPECT AT YOUR PRENATAL EXAMS  You will have prenatal   exams every 2 weeks until week 36. Then, you will have weekly prenatal exams. During a routine prenatal visit:  You will be weighed to make sure you and the fetus are growing normally.  Your blood pressure is taken.  Your abdomen will be measured to track your baby's growth.  The fetal heartbeat will be listened to.  Any test results from the previous visit will be discussed.  You may have a cervical check near your due date to see if you have effaced. At around 36 weeks, your caregiver will check your cervix. At the same time, your caregiver will also perform a test on the secretions of the vaginal tissue.  This test is to determine if a type of bacteria, Group B streptococcus, is present. Your caregiver will explain this further. Your caregiver may ask you:  What your birth plan is.  How you are feeling.  If you are feeling the baby move.  If you have had any abnormal symptoms, such as leaking fluid, bleeding, severe headaches, or abdominal cramping.  If you have any questions. Other tests or screenings that may be performed during your third trimester include:  Blood tests that check for low iron levels (anemia).  Fetal testing to check the health, activity level, and growth of the fetus. Testing is done if you have certain medical conditions or if there are problems during the pregnancy. FALSE LABOR You may feel small, irregular contractions that eventually go away. These are called Braxton Hicks contractions, or false labor. Contractions may last for hours, days, or even weeks before true labor sets in. If contractions come at regular intervals, intensify, or become painful, it is best to be seen by your caregiver.  SIGNS OF LABOR   Menstrual-like cramps.  Contractions that are 5 minutes apart or less.  Contractions that start on the top of the uterus and spread down to the lower abdomen and back.  A sense of increased pelvic pressure or back pain.  A watery or bloody mucus discharge that comes from the vagina. If you have any of these signs before the 37th week of pregnancy, call your caregiver right away. You need to go to the hospital to get checked immediately. HOME CARE INSTRUCTIONS   Avoid all smoking, herbs, alcohol, and unprescribed drugs. These chemicals affect the formation and growth of the baby.  Follow your caregiver's instructions regarding medicine use. There are medicines that are either safe or unsafe to take during pregnancy.  Exercise only as directed by your caregiver. Experiencing uterine cramps is a good sign to stop exercising.  Continue to eat regular,  healthy meals.  Wear a good support bra for breast tenderness.  Do not use hot tubs, steam rooms, or saunas.  Wear your seat belt at all times when driving.  Avoid raw meat, uncooked cheese, cat litter boxes, and soil used by cats. These carry germs that can cause birth defects in the baby.  Take your prenatal vitamins.  Try taking a stool softener (if your caregiver approves) if you develop constipation. Eat more high-fiber foods, such as fresh vegetables or fruit and whole grains. Drink plenty of fluids to keep your urine clear or pale yellow.  Take warm sitz baths to soothe any pain or discomfort caused by hemorrhoids. Use hemorrhoid cream if your caregiver approves.  If you develop varicose veins, wear support hose. Elevate your feet for 15 minutes, 3-4 times a day. Limit salt in your diet.  Avoid heavy lifting, wear low heal  shoes, and practice good posture.  Rest a lot with your legs elevated if you have leg cramps or low back pain.  Visit your dentist if you have not gone during your pregnancy. Use a soft toothbrush to brush your teeth and be gentle when you floss.  A sexual relationship may be continued unless your caregiver directs you otherwise.  Do not travel far distances unless it is absolutely necessary and only with the approval of your caregiver.  Take prenatal classes to understand, practice, and ask questions about the labor and delivery.  Make a trial run to the hospital.  Pack your hospital bag.  Prepare the baby's nursery.  Continue to go to all your prenatal visits as directed by your caregiver. SEEK MEDICAL CARE IF:  You are unsure if you are in labor or if your water has broken.  You have dizziness.  You have mild pelvic cramps, pelvic pressure, or nagging pain in your abdominal area.  You have persistent nausea, vomiting, or diarrhea.  You have a bad smelling vaginal discharge.  You have pain with urination. SEEK IMMEDIATE MEDICAL CARE IF:    You have a fever.  You are leaking fluid from your vagina.  You have spotting or bleeding from your vagina.  You have severe abdominal cramping or pain.  You have rapid weight loss or gain.  You have shortness of breath with chest pain.  You notice sudden or extreme swelling of your face, hands, ankles, feet, or legs.  You have not felt your baby move in over an hour.  You have severe headaches that do not go away with medicine.  You have vision changes. Document Released: 04/30/2001 Document Revised: 05/11/2013 Document Reviewed: 07/07/2012 [employer] Patient Information 2015 [employer]. This information is not intended to replace advice given to you by your health care provider. Make sure you discuss any questions you have with your health care provider.  Thinking About Waterbirth???  Why consider waterbirth? . Gentle birth for babies . Less pain medicine used in labor . May allow for passive descent/less pushing . May reduce perineal tears  . More mobility and instinctive maternal position changes . Increased maternal relaxation . Reduced blood pressure in labor  Is waterbirth safe? What are the risks of infection, drowning or other complications? . Infection o Very low risk (3.7 % for tub vs 4.8% for bed) o 7 in 8000 waterbirths with documented infection o Poorly cleaned equipment most common cause o Slightly lower group B strep transmission rate  . Drowning o Maternal:  - Very low risk   - Related to seizures or fainting o Newborn:  - Very low risk. No evidence of increased risk of respiratory problems in multiple large studies - Physiological protection from breathing under water - Avoid underwater birth if there are any fetal complications - Once baby's head is out of the water, keep it out.  . Birth complication o Some reports of cord trauma, but risk decreased by bringing baby to surface gradually o No evidence of increased risk of shoulder  dystocia. Mothers can usually change positions faster in water than in a bed, possibly aiding the maneuvers to free the shoulder.  You must attend a Waterbirth class at [facility]  3rd Wednesday of every month from 7-9pm  Free  Register by calling [phone] or online at [email or web address]  Bring us the certificate from the class  Waterbirth supplies needed for [facility] patients:  Our practice has a Birth Pool in a   Box tub at the hospital that you can borrow  You will need to purchase an accessory kit that has all needed supplies through [facility] (  ) or online  Or you can purchase the supplies separately: o Single-use disposable tub liner for Birth Pool in a Box (REGULAR size) o New garden hose labeled "lead-free", "suitable for drinking water", "non-toxic" OR "water potable" o Garden hose to remove the dirty water o Electric drain pump to remove water (We recommend 792 gallon per hour or greater pump.)  o Fish net o Bathing suit top (optional) o Long-handled mirror (optional)  [email or web address] sells tubs for ~ $120 if you would rather purchase your own tub  [facility] ([email or web address]) $275 for tub rental/set-up & take down/kit   Things that would prevent you from having a waterbirth:  Premature, <37wks  Previous cesarean birth  Presence of thick meconium-stained fluid  Multiple gestation (Twins, triplets, etc.)  Uncontrolled diabetes  Hypertension  Heavy vaginal bleeding  Non-reassuring fetal heart rate  Active infection (MRSA, etc.)  If your labor has to be induced  Other risk issues identified by your obstetrical provider    

## 2015-06-14 LAB — CBC
HEMATOCRIT: 34.3 % (ref 34.0–46.6)
Hemoglobin: 12 g/dL (ref 11.1–15.9)
MCH: 32.1 pg (ref 26.6–33.0)
MCHC: 35 g/dL (ref 31.5–35.7)
MCV: 92 fL (ref 79–97)
PLATELETS: 174 10*3/uL (ref 150–379)
RBC: 3.74 x10E6/uL — ABNORMAL LOW (ref 3.77–5.28)
RDW: 12.6 % (ref 12.3–15.4)
WBC: 10.5 10*3/uL (ref 3.4–10.8)

## 2015-06-14 LAB — ANTIBODY SCREEN: Antibody Screen: NEGATIVE

## 2015-06-14 LAB — GLUCOSE TOLERANCE, 1 HOUR: Glucose, 1Hr PP: 80 mg/dL (ref 65–199)

## 2015-06-14 LAB — HIV ANTIBODY (ROUTINE TESTING W REFLEX): HIV SCREEN 4TH GENERATION: NONREACTIVE

## 2015-06-14 LAB — TSH: TSH: 1.96 u[IU]/mL (ref 0.450–4.500)

## 2015-06-14 LAB — RPR: RPR Ser Ql: NONREACTIVE

## 2015-06-15 ENCOUNTER — Encounter: Payer: Self-pay | Admitting: Adult Health

## 2015-06-16 ENCOUNTER — Telehealth: Payer: Self-pay | Admitting: Adult Health

## 2015-06-16 DIAGNOSIS — Z139 Encounter for screening, unspecified: Secondary | ICD-10-CM

## 2015-06-16 NOTE — Telephone Encounter (Signed)
Orders for HSV1&2 in for am

## 2015-06-18 LAB — HSV 1 ANTIBODY, IGG

## 2015-06-18 LAB — HSV 2 ANTIBODY, IGG: HSV 2 Glycoprotein G Ab, IgG: <0.91 index (ref 0.00–0.90)

## 2015-06-19 ENCOUNTER — Telehealth: Payer: Self-pay | Admitting: Adult Health

## 2015-06-19 NOTE — Telephone Encounter (Signed)
Pt informed of WNL results ( 1 hr Glucose, RPR, HIV, CBC, TSH) from 06/13/2015.

## 2015-06-19 NOTE — Telephone Encounter (Signed)
Pt aware labs negative.  

## 2015-07-14 ENCOUNTER — Ambulatory Visit (INDEPENDENT_AMBULATORY_CARE_PROVIDER_SITE_OTHER): Payer: BLUE CROSS/BLUE SHIELD | Admitting: Obstetrics and Gynecology

## 2015-07-14 ENCOUNTER — Ambulatory Visit (INDEPENDENT_AMBULATORY_CARE_PROVIDER_SITE_OTHER): Payer: BLUE CROSS/BLUE SHIELD | Admitting: *Deleted

## 2015-07-14 ENCOUNTER — Encounter: Payer: Self-pay | Admitting: Obstetrics and Gynecology

## 2015-07-14 VITALS — BP 112/68 | HR 83 | Wt 149.0 lb

## 2015-07-14 DIAGNOSIS — Z331 Pregnant state, incidental: Secondary | ICD-10-CM

## 2015-07-14 DIAGNOSIS — Z3A32 32 weeks gestation of pregnancy: Secondary | ICD-10-CM

## 2015-07-14 DIAGNOSIS — Z23 Encounter for immunization: Secondary | ICD-10-CM

## 2015-07-14 DIAGNOSIS — Z1389 Encounter for screening for other disorder: Secondary | ICD-10-CM

## 2015-07-14 DIAGNOSIS — Z3493 Encounter for supervision of normal pregnancy, unspecified, third trimester: Secondary | ICD-10-CM

## 2015-07-14 DIAGNOSIS — Z3483 Encounter for supervision of other normal pregnancy, third trimester: Secondary | ICD-10-CM

## 2015-07-14 LAB — POCT URINALYSIS DIPSTICK
Glucose, UA: NEGATIVE
KETONES UA: NEGATIVE
Leukocytes, UA: NEGATIVE
Nitrite, UA: NEGATIVE
PROTEIN UA: NEGATIVE
RBC UA: NEGATIVE

## 2015-07-14 NOTE — Progress Notes (Signed)
Patient ID: Brianna Griffin, female   DOB: August 11, 1985, 30 y.o.   MRN: [id number] Brianna Griffin is a 30 y.o. female with a PMHx of hypothyroid, who presents today for a routine prenatal visit. She notes that this is pregnancy #2 for the pt. Pt denies any issues or concerns with her pregnancy at this time.  FHR: 135 FH: 32 cm  [id number] [redacted]w[redacted]d Estimated Date of Delivery: 09/07/15  Blood pressure 112/68, pulse 83, weight 149 lb (67.586 kg), last menstrual period 12/01/2014.   BP weight and urine results all reviewed and noted.  Please refer to the obstetrical flow sheet for the fundal height and fetal heart rate documentation:  Patient reports good fetal movement, denies any bleeding and no rupture of membranes symptoms or regular contractions. Patient is without complaints. All questions were answered.  Orders Placed This Encounter  Procedures  . POCT urinalysis dipstick    Plan:  Continued routine obstetrical care, FU/ 3 WK            iF presentation not determinable by 36 wk, do bedside u/s  No Follow-up on file.    By signing my name below, I, Soijett Blue, attest that this documentation has been prepared under the direction and in the presence of Jonnie Kind, MD. Electronically Signed: Soijett Blue, ED Scribe. 07/14/2015. 9:50 AM.  I personally performed the services described in this documentation, which was SCRIBED in my presence. The recorded information has been reviewed and considered accurate. It has been edited as necessary during review. Jonnie Kind, MD

## 2015-07-14 NOTE — Progress Notes (Signed)
Pt denies any problems or concerns at this time.  

## 2015-07-28 ENCOUNTER — Other Ambulatory Visit: Payer: Self-pay | Admitting: Adult Health

## 2015-08-03 ENCOUNTER — Ambulatory Visit (INDEPENDENT_AMBULATORY_CARE_PROVIDER_SITE_OTHER): Payer: BLUE CROSS/BLUE SHIELD | Admitting: Advanced Practice Midwife

## 2015-08-03 ENCOUNTER — Encounter (HOSPITAL_COMMUNITY): Payer: Self-pay | Admitting: *Deleted

## 2015-08-03 ENCOUNTER — Inpatient Hospital Stay (HOSPITAL_COMMUNITY)
Admission: AD | Admit: 2015-08-03 | Discharge: 2015-08-03 | Disposition: A | Discharge status: Home / Self Care | Payer: BLUE CROSS/BLUE SHIELD | Source: Ambulatory Visit | Attending: Obstetrics & Gynecology | Admitting: Obstetrics & Gynecology

## 2015-08-03 VITALS — BP 100/58 | HR 78 | Wt 152.0 lb

## 2015-08-03 DIAGNOSIS — R109 Unspecified abdominal pain: Secondary | ICD-10-CM | POA: Diagnosis present

## 2015-08-03 DIAGNOSIS — Z3A35 35 weeks gestation of pregnancy: Secondary | ICD-10-CM | POA: Insufficient documentation

## 2015-08-03 DIAGNOSIS — K219 Gastro-esophageal reflux disease without esophagitis: Secondary | ICD-10-CM | POA: Diagnosis not present

## 2015-08-03 DIAGNOSIS — O99719 Diseases of the skin and subcutaneous tissue complicating pregnancy, unspecified trimester: Secondary | ICD-10-CM

## 2015-08-03 DIAGNOSIS — Z1389 Encounter for screening for other disorder: Secondary | ICD-10-CM

## 2015-08-03 DIAGNOSIS — L309 Dermatitis, unspecified: Secondary | ICD-10-CM

## 2015-08-03 DIAGNOSIS — O4693 Antepartum hemorrhage, unspecified, third trimester: Secondary | ICD-10-CM | POA: Diagnosis not present

## 2015-08-03 DIAGNOSIS — Z3493 Encounter for supervision of normal pregnancy, unspecified, third trimester: Secondary | ICD-10-CM

## 2015-08-03 DIAGNOSIS — J45909 Unspecified asthma, uncomplicated: Secondary | ICD-10-CM | POA: Insufficient documentation

## 2015-08-03 DIAGNOSIS — O99513 Diseases of the respiratory system complicating pregnancy, third trimester: Secondary | ICD-10-CM | POA: Diagnosis not present

## 2015-08-03 DIAGNOSIS — O99713 Diseases of the skin and subcutaneous tissue complicating pregnancy, third trimester: Secondary | ICD-10-CM

## 2015-08-03 DIAGNOSIS — O99613 Diseases of the digestive system complicating pregnancy, third trimester: Secondary | ICD-10-CM | POA: Diagnosis not present

## 2015-08-03 DIAGNOSIS — Z331 Pregnant state, incidental: Secondary | ICD-10-CM

## 2015-08-03 DIAGNOSIS — K589 Irritable bowel syndrome without diarrhea: Secondary | ICD-10-CM | POA: Insufficient documentation

## 2015-08-03 LAB — URINE MICROSCOPIC-ADD ON

## 2015-08-03 LAB — POCT URINALYSIS DIPSTICK
GLUCOSE UA: NEGATIVE
Ketones, UA: NEGATIVE
LEUKOCYTES UA: NEGATIVE
NITRITE UA: NEGATIVE
Protein, UA: NEGATIVE
RBC UA: NEGATIVE

## 2015-08-03 LAB — URINALYSIS, ROUTINE W REFLEX MICROSCOPIC
BILIRUBIN URINE: NEGATIVE
GLUCOSE, UA: NEGATIVE mg/dL
KETONES UR: NEGATIVE mg/dL
Nitrite: NEGATIVE
PH: 6.5 (ref 5.0–8.0)
PROTEIN: NEGATIVE mg/dL
Specific Gravity, Urine: 1.01 (ref 1.005–1.030)

## 2015-08-03 NOTE — MAU Note (Signed)
Pt presents stating she woke up at 0430 with cramps that weren't bad but woke her from her sleep and saw bleeding when she wiped. Nurse line told her to come to hospital. Denies pain now. Intermittent cramps. No bleeding when in bathroom now. Brown discharge on panty liner

## 2015-08-03 NOTE — Progress Notes (Signed)
[id number] [redacted]w[redacted]d Estimated Date of Delivery: 09/07/15  Blood pressure 100/58, pulse 78, weight 152 lb (68.947 kg), last menstrual period 12/01/2014.   BP weight and urine results all reviewed and noted.  Please refer to the obstetrical flow sheet for the fundal height and fetal heart rate documentation:  Patient reports good fetal movement, denies any bleeding and no rupture of membranes symptoms or regular contractions. Patient has rash on stomach c/w PUPPS.  Went to [employer] class--hired [facility].  Will bring certificate and sign consents next visit. Registered pt in [place] candidate log (but no entries in 2017, so I'm wondering if this was the right one). Seen in MAU this am for some cramping and spotting--all checked out fine, says cx was 1/90/-2.  No more spotting. All questions were answered.  Orders Placed This Encounter  Procedures  . POCT urinalysis dipstick    Plan:  Continued routine obstetrical care, hyudrocortisone prn  Return in about 2 weeks (around 08/17/2015) for LROB.

## 2015-08-03 NOTE — Discharge Instructions (Signed)
Premature Rupture and Preterm Premature Rupture of Membranes Premature rupture of membranes (PROM) is when the membranes (amniotic sac) break open before contractions or labor starts. Rupture of membranes is commonly referred to as your water breaking. If PROM occurs before 37 weeks of pregnancy, it is called preterm premature rupture of membranes (PPROM). The amniotic sac holds the fetus, keeps infection out, and performs other important functions. Having the amniotic sac rupture before 37 weeks of pregnancy can lead to serious problems and requires immediate attention by your health care provider. CAUSES  PROM near the end of the pregnancy may be caused by natural weakening of the membranes. PPROM is often due to an infection. Other factors that may be associated with PROM include:  Stretching of the amniotic sac because of carrying multiples or having too much amniotic fluid.  Trauma.  Smoking during pregnancy.  Poor nutrition.  Previous preterm birth.  Vaginal bleeding.  Little to no prenatal care.  Problems with the placenta, such as placenta previa or placental abruption. RISKS OF PROM AND PPROM  Delivering a premature baby.  Getting a serious infection of the placental tissues (chorioamnionitis).  Early detachment of the placenta from the uterus (placental abruption).  Compression of the umbilical cord.  Needing a cesarean birth.  Developing a serious infection after delivery. SIGNS OF PROM OR PPROM   A sudden gush or slow leaking of fluid from the vagina.  Constant wet underwear. Sometimes, women mistake the leaking or wetness for urine, especially if the leak is slow and not a gush of fluid. If there is constant leaking or your underwear continues to get wet, your membranes have likely ruptured. WHAT TO DO IF YOU THINK YOUR MEMBRANES HAVE RUPTURED Call your health care provider right away. You will need to go to the hospital to get checked immediately. WHAT HAPPENS  IF YOU ARE DIAGNOSED WITH PROM OR PPROM? Once you arrive at the hospital, you will have tests done. A cervical exam will be performed to check if the cervix has softened or started to open (dilate). If you are diagnosed with PROM, you may be induced within 24 hours if you are not having contractions. If you are diagnosed with PPROM and are not having contractions, you may be induced depending on your trimester.  If you have PPROM, you:  And your baby will be monitored closely for signs of infection or other complications.  May be given an antibiotic medicine to lower the chances of an infection developing.  May be given a steroid medicine to help mature the baby's lungs faster.  May be given a medicine to stop preterm labor.  May be ordered to be on bed rest at home or in the hospital.  May be induced if complications arise for you or the baby. Your treatment will depend on many factors, such as how far along you are, the development of the baby, and other complications that may arise.   This information is not intended to replace advice given to you by your health care provider. Make sure you discuss any questions you have with your health care provider.   Document Released: 05/06/2005 Document Revised: 02/24/2013 Document Reviewed: 08/25/2012 Elsevier Interactive Patient Education [employer].

## 2015-08-03 NOTE — MAU Provider Note (Signed)
History    CSN: [id number]  Arrival date and time: 08/03/15 0554   First Provider Initiated Contact with Patient 08/03/15 [phone]      Chief Complaint  Patient presents with  . Vaginal Bleeding  . Contractions   HPI Brianna Griffin is a 30 yo G4P1021 at [redacted]w[redacted]d who presents with abdominal cramping and vaginal bleeding. Pt woke up at 4:30am this morning with some cramping, went to the bathroom and noticed some blood on the tissue paper. Contractions have been irregular, but present. Since being in MAU, has not felt any contractions. Denies any sexual activity in last couple days.    Pt denies dysuria, +FM, no complications with this pregnancy. Denies passage of clots, small volume of vaginal bleeding. Denies chest pain and SOB. Some leg swelling present. Denies diarrhea, mild nausea present this morning. Pt reports BM today.   Past Medical History  Diagnosis Date  . Anxiety   . Depression   . Asthma   . Migraine headache   . IBS (irritable bowel syndrome)   . Allergy   . Miscarriage 09/09/2013  . Supervision of normal pregnancy in first trimester 03/04/2014  . 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 GERD (gastroesophageal reflux disease)   . Gastritis   . Migraines   . Thyroid disease     hypothyroid  . Pregnant 12/28/2014  . Vaginal discharge 01/31/2015  . Supervision of other normal pregnancy 02/02/2015    History reviewed. No pertinent past surgical history.  Family History  Problem Relation Age of Onset  . Diabetes Maternal Grandmother   . Hypertension Maternal Grandmother   . Cancer Maternal Grandfather     bladder and liver  . Crohn's disease Maternal Grandfather     Social History  Substance Use Topics  . Smoking status: Never Smoker   . Smokeless tobacco: Never Used  . Alcohol Use: No    Allergies:  Allergies  Allergen Reactions  . Ibuprofen     REACTION: hives, irregular heart rate    Prescriptions prior to admission  Medication Sig Dispense Refill Last Dose  .  levothyroxine (SYNTHROID, LEVOTHROID) 25 MCG tablet 25 mcg daily.    08/02/2015 at Unknown time  . Prenatal Vit-Fe Sulfate-FA (PRENATAL VITAMIN PO) Take 1 tablet by mouth daily.   Past Week at Unknown time  . albuterol (PROVENTIL HFA;VENTOLIN HFA) 108 (90 BASE) MCG/ACT inhaler Inhale 2 puffs into the lungs every 4 (four) hours as needed for wheezing. 1 Inhaler 2 Taking  . DICLEGIS 10-10 MG TBEC TAKE 2 TABS FOR 2 NIGHTS, ON 3RD DAY:1 TAB IN THE MORNING & 2 TABS ATNIGHT,ON 4TH DAY:1 TAB IN THE MORNING,1 TAB IN THE AFTERNOON,& 2 TABS 60 tablet 1   . pantoprazole (PROTONIX) 20 MG tablet Take 1 tablet (20 mg total) by mouth daily. (Patient not taking: Reported on 07/14/2015) 30 tablet 6 Not Taking    ROS: See HPI Physical Exam   Blood pressure 117/75, pulse 80, temperature 98.3 F (36.8 C), temperature source Oral, resp. rate 18, last menstrual period 12/01/2014.  Physical Exam  Constitutional: She is oriented to person, place, and time. She appears well-developed and well-nourished.  HENT:  Head: Normocephalic and atraumatic.  Eyes: Conjunctivae and EOM are normal. Pupils are equal, round, and reactive to light.  Neck: Normal range of motion. Neck supple.  Cardiovascular: Normal rate, regular rhythm, normal heart sounds and intact distal pulses.   Respiratory: Effort normal and breath sounds normal. No respiratory distress.  GI: Bowel sounds are normal. She  exhibits distension (gravid abdomen).  Musculoskeletal: Normal range of motion. She exhibits no edema.  Neurological: She is alert and oriented to person, place, and time.  Skin: Skin is warm and dry. No rash noted.  Psychiatric: She has a normal mood and affect. Her behavior is normal. Judgment and thought content normal.   Dilation: 1.5 Effacement (%): 90 Cervical Position: Posterior Station: -3 Presentation: Vertex Exam by:: Gambino MD  MAU Course  Procedures  MDM FHT reactive. No contractions on monitor, no pooling of fluid or  bloody show on cervical exam.   Assessment and Plan  Brianna Griffin is a 30 yo G4P1021 at [redacted]w[redacted]d who presents with abdominal cramping and vaginal bleeding  Vaginal Bleeding and abdominal cramping: Resolved. No bloody show or signs of labor in MAU.  - Follow up at [facility] appointment today  Normal Pregnancy: No complications thus far.  - Follow up at [facility] appointment today  Carlyle Dolly 08/03/2015, 6:38 AM   I have participated in the care of this patient and I agree with the above. Serita Grammes 9:40 PM 08/05/2015

## 2015-08-03 NOTE — Patient Instructions (Signed)
Pruritic Urticarial Papules and Plaques of Pregnancy  When you are pregnant, your body changes in many ways. That includes the skin. Rashes sometimes develop. One skin rash that can happen during pregnancy is called pruritic urticarial papules and plaques of pregnancy (PUPPP). The small red bumps sometimes form large plaques. These are very itchy. The rash usually appears in the last few weeks of pregnancy during the third trimester. Sometimes, it can occur shortly after giving birth. It goes away shortly after your baby is born. It does not harm you or your baby and will not leave scars on your skin. PUPPP is most common in first pregnancies or in those involving more than one baby. It usually will not return during later pregnancies.  CAUSES   The exact cause is unknown. However, it may be related to your skin stretching rapidly due to pregnancy.   SYMPTOMS   PUPPP symptoms include a very itchy rash. The rash often looks red and raised and is most often seen on the abdomen. It can spread to the legs, thighs, or arms. Sometimes tiny blisters form in the center of the rash patches. The skin around the rash is often pale.  DIAGNOSIS   To decide if you have PUPPP, your health care provider will perform a physical exam and ask questions about your symptoms. He or she may order blood tests to rule out other causes of the rash.  TREATMENT   The goal is to stop the itching and keep the rash from spreading. Usually, a cream is used to do this. However, treatment varies. Common options include medicines that relieve or lessen itching. Some medicines may be in the form of a cream or ointment, while others you may take by mouth (orally). The medicines are either corticosteroids or antihistamines. Treatment helps nearly all women with this rash. The creams, ointments, or pills should make your skin feel better fairly quickly.   HOME CARE INSTRUCTIONS   · Only take over-the-counter or prescription medicines as directed by your  health care provider.  · Apply any creams as directed by your health care provider.  · Do not scratch the rash.  · Wear loose clothing.  · Keep all follow-up appointments with your health care provider.  SEEK MEDICAL CARE IF:   · The itching does not go away after treatment.  · Your rash continues to spread.  · You are unable to sleep because of the irritation.     This information is not intended to replace advice given to you by your health care provider. Make sure you discuss any questions you have with your health care provider.     Document Released: 07/31/2009 Document Revised: 01/06/2013 Document Reviewed: 10/18/2012  Elsevier Interactive Patient Education ©2016 Elsevier Inc.

## 2015-08-07 ENCOUNTER — Encounter: Payer: Self-pay | Admitting: Adult Health

## 2015-08-07 ENCOUNTER — Telehealth: Payer: Self-pay | Admitting: *Deleted

## 2015-08-07 NOTE — Telephone Encounter (Signed)
Pt states she has had diarrhea x 1 day but has subsided today, +FM, no vaginal bleeding, no gush of fluids, contraction not sure if [facility] or not. Pt states "woke up during the night and felt funny, looked at stomach and it looked odd." Pt states she is able to eat and drink, does not feel she is dehydrated. Pt offered an appt for tomorrow. Pt states she will wait and see how she is feeling and if not better will call back and make an appt.

## 2015-08-18 ENCOUNTER — Encounter: Payer: Self-pay | Admitting: Obstetrics & Gynecology

## 2015-08-18 ENCOUNTER — Ambulatory Visit (INDEPENDENT_AMBULATORY_CARE_PROVIDER_SITE_OTHER): Payer: BLUE CROSS/BLUE SHIELD | Admitting: Obstetrics & Gynecology

## 2015-08-18 VITALS — BP 120/80 | HR 92 | Wt 154.0 lb

## 2015-08-18 DIAGNOSIS — Z1159 Encounter for screening for other viral diseases: Secondary | ICD-10-CM

## 2015-08-18 DIAGNOSIS — Z029 Encounter for administrative examinations, unspecified: Secondary | ICD-10-CM

## 2015-08-18 DIAGNOSIS — Z1389 Encounter for screening for other disorder: Secondary | ICD-10-CM

## 2015-08-18 DIAGNOSIS — Z3685 Encounter for antenatal screening for Streptococcus B: Secondary | ICD-10-CM

## 2015-08-18 DIAGNOSIS — Z118 Encounter for screening for other infectious and parasitic diseases: Secondary | ICD-10-CM

## 2015-08-18 DIAGNOSIS — Z3493 Encounter for supervision of normal pregnancy, unspecified, third trimester: Secondary | ICD-10-CM

## 2015-08-18 DIAGNOSIS — Z331 Pregnant state, incidental: Secondary | ICD-10-CM

## 2015-08-18 LAB — POCT URINALYSIS DIPSTICK
GLUCOSE UA: NEGATIVE
Ketones, UA: NEGATIVE
Leukocytes, UA: NEGATIVE
NITRITE UA: NEGATIVE
PROTEIN UA: NEGATIVE
RBC UA: NEGATIVE

## 2015-08-18 NOTE — Progress Notes (Signed)
[id number] [redacted]w[redacted]d Estimated Date of Delivery: 09/07/15  Blood pressure 120/80, pulse 92, weight 154 lb (69.854 kg), last menstrual period 12/01/2014.   BP weight and urine results all reviewed and noted.  Please refer to the obstetrical flow sheet for the fundal height and fetal heart rate documentation:  Patient reports good fetal movement, denies any bleeding and no rupture of membranes symptoms or regular contractions. Patient is without complaints. All questions were answered.  Orders Placed This Encounter  Procedures  . GC/Chlamydia Probe Amp  . Strep Gp B NAA  . POCT urinalysis dipstick    Plan:  Continued routine obstetrical care, GBS today  No Follow-up on file.

## 2015-08-19 IMAGING — RF DG HYSTEROGRAM
3 series · 3 of 3 positions shown · IV contrast (omnipaque)
Comparison: None.

CLINICAL DATA: Recurrent pregnancy loss with pregnancy

EXAM:
HYSTEROSALPINGOGRAM
TECHNIQUE: Following cleansing of the cervix and vagina with Betadine solution,
a hysterosalpingogram was performed using a 5-French
hysterosalpingogram catheter and Omnipaque 300 contrast. The patient
tolerated the examination without difficulty.

[Series 1: run · 1 of 1 slices shown (1 of 3)]
[im 1/1]
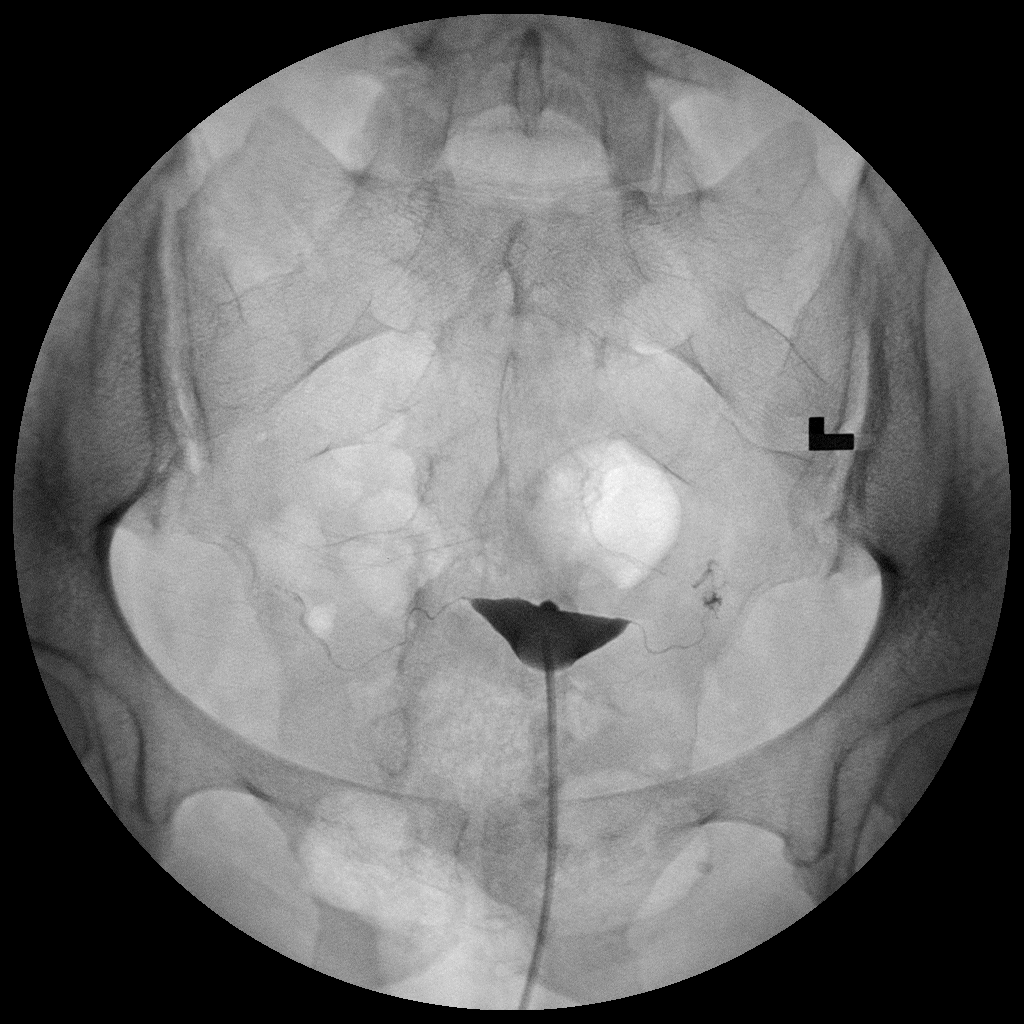

[Series 2: run · 1 of 1 slices shown (2 of 3)]
[im 1/1]
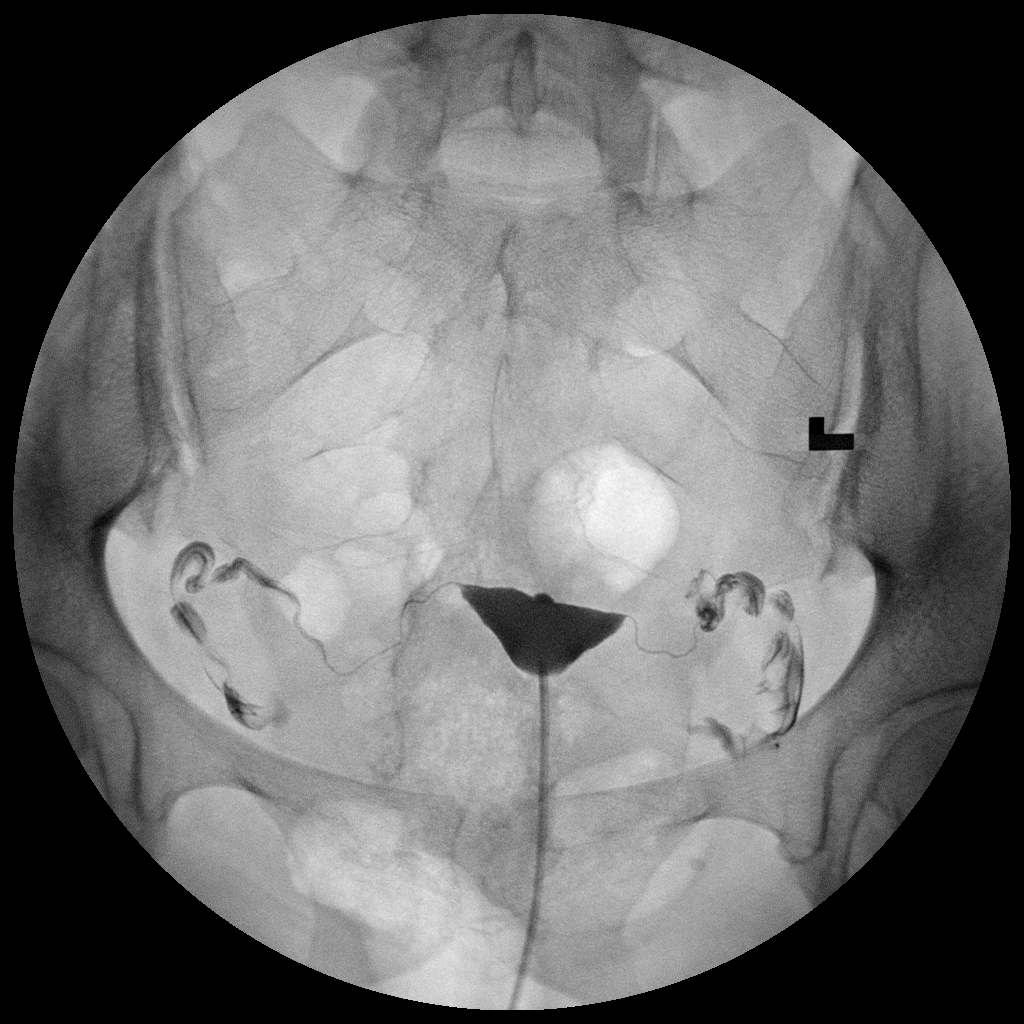

[Series 3: run · 1 of 1 slices shown (3 of 3)]
[im 1/1]
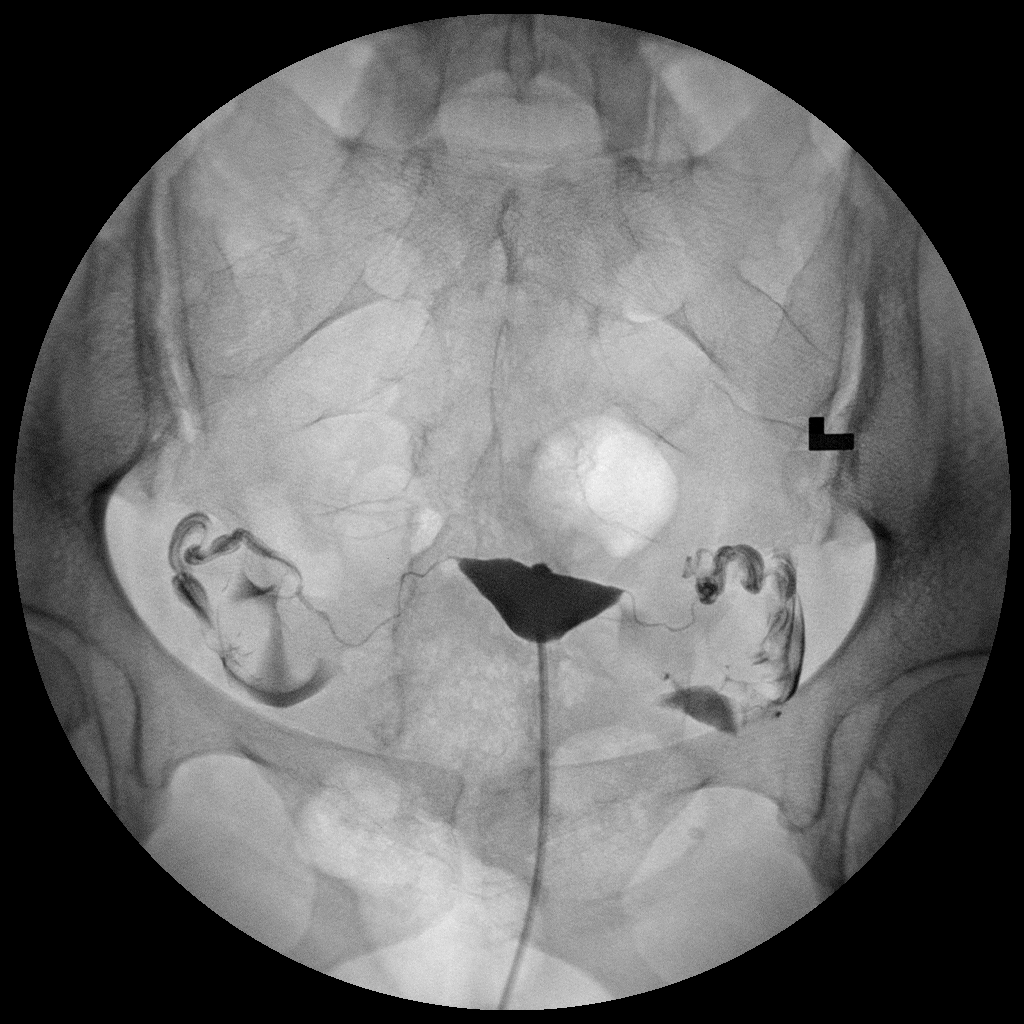

[3 of 3 positions shown; findings below may reference images not displayed]

FLUOROSCOPY TIME:  Radiation Exposure Index (as provided by the
fluoroscopic device):

If the device does not provide the exposure index:

Fluoroscopy Time:  36 seconds

Number of Acquired Images:  3
FINDINGS: Endometrial cavity is normal. Both fallopian tubes fill and have a
normal appearance. Normal spillage bilaterally.
IMPRESSION: Normal study.

## 2015-08-20 LAB — STREP GP B NAA: Strep Gp B NAA: NEGATIVE

## 2015-08-22 LAB — GC/CHLAMYDIA PROBE AMP
Chlamydia trachomatis, NAA: NEGATIVE
Neisseria gonorrhoeae by PCR: NEGATIVE

## 2015-08-24 ENCOUNTER — Encounter: Payer: BLUE CROSS/BLUE SHIELD | Admitting: Obstetrics & Gynecology

## 2015-08-25 ENCOUNTER — Encounter: Payer: Self-pay | Admitting: Obstetrics & Gynecology

## 2015-08-25 ENCOUNTER — Ambulatory Visit (INDEPENDENT_AMBULATORY_CARE_PROVIDER_SITE_OTHER): Payer: BLUE CROSS/BLUE SHIELD | Admitting: Obstetrics & Gynecology

## 2015-08-25 VITALS — BP 122/80 | HR 94 | Wt 152.0 lb

## 2015-08-25 DIAGNOSIS — Z1389 Encounter for screening for other disorder: Secondary | ICD-10-CM

## 2015-08-25 DIAGNOSIS — Z3A39 39 weeks gestation of pregnancy: Secondary | ICD-10-CM

## 2015-08-25 DIAGNOSIS — Z3493 Encounter for supervision of normal pregnancy, unspecified, third trimester: Secondary | ICD-10-CM

## 2015-08-25 DIAGNOSIS — Z3483 Encounter for supervision of other normal pregnancy, third trimester: Secondary | ICD-10-CM

## 2015-08-25 DIAGNOSIS — Z331 Pregnant state, incidental: Secondary | ICD-10-CM

## 2015-08-25 LAB — POCT URINALYSIS DIPSTICK
GLUCOSE UA: NEGATIVE
KETONES UA: NEGATIVE
NITRITE UA: NEGATIVE
Protein, UA: NEGATIVE

## 2015-08-25 NOTE — Progress Notes (Signed)
[id number] [redacted]w[redacted]d Estimated Date of Delivery: 09/07/15  Blood pressure 122/80, pulse 94, weight 152 lb (68.947 kg), last menstrual period 12/01/2014.   BP weight and urine results all reviewed and noted.  Please refer to the obstetrical flow sheet for the fundal height and fetal heart rate documentation:  Patient reports good fetal movement, denies any bleeding and no rupture of membranes symptoms or regular contractions. Patient is without complaints. All questions were answered.  Orders Placed This Encounter  Procedures  . POCT urinalysis dipstick    Plan:  Continued routine obstetrical care,   No Follow-up on file.

## 2015-08-31 ENCOUNTER — Ambulatory Visit (INDEPENDENT_AMBULATORY_CARE_PROVIDER_SITE_OTHER): Payer: BLUE CROSS/BLUE SHIELD | Admitting: Obstetrics & Gynecology

## 2015-08-31 ENCOUNTER — Inpatient Hospital Stay (HOSPITAL_COMMUNITY)
Admission: AD | Admit: 2015-08-31 | Discharge: 2015-09-02 | DRG: 775 | Disposition: A | Discharge status: Home / Self Care | Payer: BLUE CROSS/BLUE SHIELD | Source: Ambulatory Visit | Attending: Obstetrics & Gynecology | Admitting: Obstetrics & Gynecology

## 2015-08-31 ENCOUNTER — Encounter: Payer: Self-pay | Admitting: Obstetrics & Gynecology

## 2015-08-31 VITALS — BP 120/80 | HR 80 | Wt 152.0 lb

## 2015-08-31 DIAGNOSIS — Z833 Family history of diabetes mellitus: Secondary | ICD-10-CM

## 2015-08-31 DIAGNOSIS — Z3A39 39 weeks gestation of pregnancy: Secondary | ICD-10-CM | POA: Diagnosis not present

## 2015-08-31 DIAGNOSIS — O9952 Diseases of the respiratory system complicating childbirth: Secondary | ICD-10-CM | POA: Diagnosis present

## 2015-08-31 DIAGNOSIS — K589 Irritable bowel syndrome without diarrhea: Secondary | ICD-10-CM | POA: Diagnosis present

## 2015-08-31 DIAGNOSIS — J45909 Unspecified asthma, uncomplicated: Secondary | ICD-10-CM | POA: Diagnosis present

## 2015-08-31 DIAGNOSIS — Z3483 Encounter for supervision of other normal pregnancy, third trimester: Secondary | ICD-10-CM | POA: Diagnosis not present

## 2015-08-31 DIAGNOSIS — Z1389 Encounter for screening for other disorder: Secondary | ICD-10-CM

## 2015-08-31 DIAGNOSIS — E039 Hypothyroidism, unspecified: Secondary | ICD-10-CM | POA: Diagnosis present

## 2015-08-31 DIAGNOSIS — O99344 Other mental disorders complicating childbirth: Secondary | ICD-10-CM | POA: Diagnosis present

## 2015-08-31 DIAGNOSIS — Z3493 Encounter for supervision of normal pregnancy, unspecified, third trimester: Secondary | ICD-10-CM

## 2015-08-31 DIAGNOSIS — Z8249 Family history of ischemic heart disease and other diseases of the circulatory system: Secondary | ICD-10-CM

## 2015-08-31 DIAGNOSIS — Z331 Pregnant state, incidental: Secondary | ICD-10-CM

## 2015-08-31 DIAGNOSIS — K219 Gastro-esophageal reflux disease without esophagitis: Secondary | ICD-10-CM | POA: Diagnosis present

## 2015-08-31 DIAGNOSIS — O99280 Endocrine, nutritional and metabolic diseases complicating pregnancy, unspecified trimester: Secondary | ICD-10-CM

## 2015-08-31 DIAGNOSIS — O99284 Endocrine, nutritional and metabolic diseases complicating childbirth: Secondary | ICD-10-CM | POA: Diagnosis present

## 2015-08-31 DIAGNOSIS — F418 Other specified anxiety disorders: Secondary | ICD-10-CM | POA: Diagnosis present

## 2015-08-31 DIAGNOSIS — O9962 Diseases of the digestive system complicating childbirth: Principal | ICD-10-CM | POA: Diagnosis present

## 2015-08-31 DIAGNOSIS — Z3490 Encounter for supervision of normal pregnancy, unspecified, unspecified trimester: Secondary | ICD-10-CM

## 2015-08-31 DIAGNOSIS — O99713 Diseases of the skin and subcutaneous tissue complicating pregnancy, third trimester: Secondary | ICD-10-CM

## 2015-08-31 DIAGNOSIS — L309 Dermatitis, unspecified: Secondary | ICD-10-CM

## 2015-08-31 LAB — POCT URINALYSIS DIPSTICK
GLUCOSE UA: NEGATIVE
Ketones, UA: NEGATIVE
Leukocytes, UA: NEGATIVE
NITRITE UA: NEGATIVE
RBC UA: NEGATIVE

## 2015-08-31 NOTE — Progress Notes (Signed)
[id number] [redacted]w[redacted]d Estimated Date of Delivery: 09/07/15  Blood pressure 120/80, pulse 80, weight 152 lb (68.947 kg), last menstrual period 12/01/2014.   BP weight and urine results all reviewed and noted.  Please refer to the obstetrical flow sheet for the fundal height and fetal heart rate documentation:  Patient reports good fetal movement, denies any bleeding and no rupture of membranes symptoms or regular contractions. Patient is without complaints. All questions were answered.  Orders Placed This Encounter  Procedures  . POCT urinalysis dipstick    Plan:  Continued routine obstetrical care, membranes stripped  No Follow-up on file.

## 2015-09-01 ENCOUNTER — Inpatient Hospital Stay (HOSPITAL_COMMUNITY): Payer: BLUE CROSS/BLUE SHIELD | Admitting: Anesthesiology

## 2015-09-01 ENCOUNTER — Encounter (HOSPITAL_COMMUNITY): Payer: Self-pay

## 2015-09-01 DIAGNOSIS — O99344 Other mental disorders complicating childbirth: Secondary | ICD-10-CM | POA: Diagnosis present

## 2015-09-01 DIAGNOSIS — Z833 Family history of diabetes mellitus: Secondary | ICD-10-CM | POA: Diagnosis not present

## 2015-09-01 DIAGNOSIS — E039 Hypothyroidism, unspecified: Secondary | ICD-10-CM | POA: Diagnosis present

## 2015-09-01 DIAGNOSIS — O9962 Diseases of the digestive system complicating childbirth: Secondary | ICD-10-CM | POA: Diagnosis present

## 2015-09-01 DIAGNOSIS — O9952 Diseases of the respiratory system complicating childbirth: Secondary | ICD-10-CM | POA: Diagnosis present

## 2015-09-01 DIAGNOSIS — O99284 Endocrine, nutritional and metabolic diseases complicating childbirth: Secondary | ICD-10-CM | POA: Diagnosis present

## 2015-09-01 DIAGNOSIS — K219 Gastro-esophageal reflux disease without esophagitis: Secondary | ICD-10-CM | POA: Diagnosis present

## 2015-09-01 DIAGNOSIS — K589 Irritable bowel syndrome without diarrhea: Secondary | ICD-10-CM | POA: Diagnosis present

## 2015-09-01 DIAGNOSIS — Z3A39 39 weeks gestation of pregnancy: Secondary | ICD-10-CM | POA: Diagnosis not present

## 2015-09-01 DIAGNOSIS — Z8249 Family history of ischemic heart disease and other diseases of the circulatory system: Secondary | ICD-10-CM | POA: Diagnosis not present

## 2015-09-01 DIAGNOSIS — J45909 Unspecified asthma, uncomplicated: Secondary | ICD-10-CM | POA: Diagnosis present

## 2015-09-01 DIAGNOSIS — F418 Other specified anxiety disorders: Secondary | ICD-10-CM | POA: Diagnosis not present

## 2015-09-01 LAB — CBC
HEMATOCRIT: 33.7 % — AB (ref 36.0–46.0)
HEMATOCRIT: 31.1 % — AB (ref 36.0–46.0)
HEMOGLOBIN: 11.9 g/dL — AB (ref 12.0–15.0)
Hemoglobin: 11 g/dL — ABNORMAL LOW (ref 12.0–15.0)
MCH: 32.1 pg (ref 26.0–34.0)
MCH: 31.7 pg (ref 26.0–34.0)
MCHC: 35.3 g/dL (ref 30.0–36.0)
MCHC: 35.4 g/dL (ref 30.0–36.0)
MCV: 90.8 fL (ref 78.0–100.0)
MCV: 89.6 fL (ref 78.0–100.0)
PLATELETS: 142 10*3/uL — AB (ref 150–400)
Platelets: 149 10*3/uL — ABNORMAL LOW (ref 150–400)
RBC: 3.71 MIL/uL — ABNORMAL LOW (ref 3.87–5.11)
RBC: 3.47 MIL/uL — AB (ref 3.87–5.11)
RDW: 13.5 % (ref 11.5–15.5)
RDW: 13.5 % (ref 11.5–15.5)
WBC: 15.5 10*3/uL — ABNORMAL HIGH (ref 4.0–10.5)
WBC: 21.9 10*3/uL — AB (ref 4.0–10.5)

## 2015-09-01 LAB — TYPE AND SCREEN
ABO/RH(D): B POS
Antibody Screen: NEGATIVE

## 2015-09-01 LAB — RPR: RPR Ser Ql: NONREACTIVE

## 2015-09-01 LAB — ABO/RH: ABO/RH(D): B POS

## 2015-09-01 MED ORDER — EPHEDRINE 5 MG/ML INJ
10.0000 mg | INTRAVENOUS | Status: DC | PRN
  Filled 2015-09-01: qty 2

## 2015-09-01 MED ORDER — ZOLPIDEM TARTRATE 5 MG PO TABS: 5.0000 mg | ORAL_TABLET | Freq: Every evening | ORAL | Status: DC | PRN

## 2015-09-01 MED ORDER — LACTATED RINGERS IV SOLN: 500.0000 mL | INTRAVENOUS | Status: DC | PRN

## 2015-09-01 MED ORDER — IBUPROFEN 600 MG PO TABS: 600.0000 mg | ORAL_TABLET | Freq: Four times a day (QID) | ORAL | Status: DC

## 2015-09-01 MED ORDER — BENZOCAINE-MENTHOL 20-0.5 % EX AERO
1.0000 "application " | INHALATION_SPRAY | CUTANEOUS | Status: DC | PRN
  Administered 2015-09-01: 1 via TOPICAL
  Filled 2015-09-01: qty 56

## 2015-09-01 MED ORDER — PRENATAL MULTIVITAMIN CH: 1.0000 | ORAL_TABLET | Freq: Every day | ORAL | Status: DC

## 2015-09-01 MED ORDER — OXYTOCIN BOLUS FROM INFUSION
500.0000 mL | INTRAVENOUS | Status: DC
  Administered 2015-09-01: 500 mL via INTRAVENOUS

## 2015-09-01 MED ORDER — DIBUCAINE 1 % RE OINT: 1.0000 "application " | TOPICAL_OINTMENT | RECTAL | Status: DC | PRN

## 2015-09-01 MED ORDER — OXYTOCIN 10 UNIT/ML IJ SOLN
2.5000 [IU]/h | INTRAVENOUS | Status: DC
  Filled 2015-09-01: qty 4

## 2015-09-01 MED ORDER — COCONUT OIL OIL: 1.0000 "application " | TOPICAL_OIL | Status: DC | PRN

## 2015-09-01 MED ORDER — ONDANSETRON HCL 4 MG PO TABS: 4.0000 mg | ORAL_TABLET | ORAL | Status: DC | PRN

## 2015-09-01 MED ORDER — OXYTOCIN 10 UNIT/ML IJ SOLN
INTRAMUSCULAR | Status: AC
  Filled 2015-09-01: qty 1

## 2015-09-01 MED ORDER — PHENYLEPHRINE 40 MCG/ML (10ML) SYRINGE FOR IV PUSH (FOR BLOOD PRESSURE SUPPORT)
80.0000 ug | PREFILLED_SYRINGE | INTRAVENOUS | Status: DC | PRN
  Filled 2015-09-01: qty 2
  Filled 2015-09-01: qty 20

## 2015-09-01 MED ORDER — DIPHENHYDRAMINE HCL 50 MG/ML IJ SOLN: 12.5000 mg | INTRAMUSCULAR | Status: DC | PRN

## 2015-09-01 MED ORDER — WITCH HAZEL-GLYCERIN EX PADS: 1.0000 "application " | MEDICATED_PAD | CUTANEOUS | Status: DC | PRN

## 2015-09-01 MED ORDER — OXYCODONE-ACETAMINOPHEN 5-325 MG PO TABS: 1.0000 | ORAL_TABLET | ORAL | Status: DC | PRN

## 2015-09-01 MED ORDER — DIPHENHYDRAMINE HCL 25 MG PO CAPS: 25.0000 mg | ORAL_CAPSULE | Freq: Four times a day (QID) | ORAL | Status: DC | PRN

## 2015-09-01 MED ORDER — LACTATED RINGERS IV SOLN: 500.0000 mL | Freq: Once | INTRAVENOUS | Status: DC

## 2015-09-01 MED ORDER — LACTATED RINGERS IV SOLN: INTRAVENOUS | Status: DC

## 2015-09-01 MED ORDER — TETANUS-DIPHTH-ACELL PERTUSSIS 5-2.5-18.5 LF-MCG/0.5 IM SUSP: 0.5000 mL | Freq: Once | INTRAMUSCULAR | Status: DC

## 2015-09-01 MED ORDER — LIDOCAINE HCL (PF) 1 % IJ SOLN
30.0000 mL | INTRAMUSCULAR | Status: DC | PRN
  Filled 2015-09-01: qty 30

## 2015-09-01 MED ORDER — OXYCODONE-ACETAMINOPHEN 5-325 MG PO TABS: 2.0000 | ORAL_TABLET | ORAL | Status: DC | PRN

## 2015-09-01 MED ORDER — FLEET ENEMA 7-19 GM/118ML RE ENEM: 1.0000 | ENEMA | RECTAL | Status: DC | PRN

## 2015-09-01 MED ORDER — PHENYLEPHRINE 40 MCG/ML (10ML) SYRINGE FOR IV PUSH (FOR BLOOD PRESSURE SUPPORT)
80.0000 ug | PREFILLED_SYRINGE | INTRAVENOUS | Status: DC | PRN
  Filled 2015-09-01: qty 2

## 2015-09-01 MED ORDER — LIDOCAINE HCL (PF) 1 % IJ SOLN
INTRAMUSCULAR | Status: DC | PRN
  Administered 2015-09-01 (×2): 5 mL

## 2015-09-01 MED ORDER — ALBUTEROL SULFATE (2.5 MG/3ML) 0.083% IN NEBU: 3.0000 mL | INHALATION_SOLUTION | RESPIRATORY_TRACT | Status: DC | PRN

## 2015-09-01 MED ORDER — ACETAMINOPHEN 325 MG PO TABS
650.0000 mg | ORAL_TABLET | ORAL | Status: DC | PRN
  Administered 2015-09-01 (×2): 650 mg via ORAL
  Filled 2015-09-01 (×2): qty 2

## 2015-09-01 MED ORDER — LEVOTHYROXINE SODIUM 25 MCG PO TABS
25.0000 ug | ORAL_TABLET | Freq: Every day | ORAL | Status: DC
  Administered 2015-09-02: 25 ug via ORAL
  Filled 2015-09-01 (×3): qty 1

## 2015-09-01 MED ORDER — FENTANYL 2.5 MCG/ML BUPIVACAINE 1/10 % EPIDURAL INFUSION (WH - ANES)
14.0000 mL/h | INTRAMUSCULAR | Status: DC | PRN
  Administered 2015-09-01 (×2): 14 mL/h via EPIDURAL
  Filled 2015-09-01: qty 125

## 2015-09-01 MED ORDER — CITRIC ACID-SODIUM CITRATE 334-500 MG/5ML PO SOLN: 30.0000 mL | ORAL | Status: DC | PRN

## 2015-09-01 MED ORDER — SIMETHICONE 80 MG PO CHEW: 80.0000 mg | CHEWABLE_TABLET | ORAL | Status: DC | PRN

## 2015-09-01 MED ORDER — SENNOSIDES-DOCUSATE SODIUM 8.6-50 MG PO TABS
2.0000 | ORAL_TABLET | ORAL | Status: DC
  Filled 2015-09-01: qty 2

## 2015-09-01 MED ORDER — ACETAMINOPHEN 325 MG PO TABS: 650.0000 mg | ORAL_TABLET | ORAL | Status: DC | PRN

## 2015-09-01 MED ORDER — ONDANSETRON HCL 4 MG/2ML IJ SOLN: 4.0000 mg | INTRAMUSCULAR | Status: DC | PRN

## 2015-09-01 MED ORDER — ONDANSETRON HCL 4 MG/2ML IJ SOLN: 4.0000 mg | Freq: Four times a day (QID) | INTRAMUSCULAR | Status: DC | PRN

## 2015-09-01 NOTE — H&P (Signed)
LABOR ADMISSION HISTORY AND PHYSICAL  Brianna Griffin is a 30 y.o. female [phone] with IUP at [redacted]w[redacted]d by LMP/ ultrasound presenting for onset of labor. She reports +FMs.  Had bloody show that started this afternoon after membranes were stripped.  Denies LOF, blurry vision, headaches, peripheral edema, RUQ pain.  She plans on breast feeding. She request Depo for birth control.  Plans on bringing child to [facility] for pediatric care after discharge.  Dating: By LMP and u/s --->  Estimated Date of Delivery: 09/07/15  Sono:    @[redacted]w[redacted]d , normal anatomy, Breech presentation, 296g   Prenatal History/Complications:  [place]  Initiated Care at   02/02/15  FOB  Brianna Griffin 30 yo wm 1st   Dating By  LMP and [place]  Pap   03/04/14  GC/CT Initial:     -/-           36+wks:  Genetic Screen NT/ITAFP:  declined   CF screen declined  Anatomic [place] Normal female, 'Brianna Griffin'  Flu vaccine 04/17/2015   Tdap Recommended ~ 28wks  Glucose Screen  1 hr 80  GBS negative  Feed Preference breast  Contraception Undecided, maybe depo  Circumcision yes  Childbirth Classes Interested, info given- signed up, and doing WB class in Feb  Pediatrician Brianna Griffin   Past Medical History: Past Medical History  Diagnosis Date  . Anxiety   . Depression   . Asthma   . Migraine headache   . IBS (irritable bowel syndrome)   . Allergy   . Miscarriage 09/09/2013  . Supervision of normal pregnancy in first trimester 03/04/2014  . 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 GERD (gastroesophageal reflux disease)   . Gastritis   . Migraines   . Thyroid disease     hypothyroid  . Pregnant 12/28/2014  . Vaginal discharge 01/31/2015  . Supervision of other normal pregnancy 02/02/2015    Past Surgical History: History reviewed. No pertinent past surgical history.  Obstetrical History: OB History    Gravida Para Term Preterm AB TAB SAB Ectopic Multiple Living   4 1 1  2  2   1       Social  History: Social History   Social History  . Marital Status: Married    Spouse Name: N/A  . Number of Children: N/A  . Years of Education: N/A   Social History Main Topics  . Smoking status: Never Smoker   . Smokeless tobacco: Never Used  . Alcohol Use: No  . Drug Use: No  . Sexual Activity: Yes    Birth Control/ Protection: None   Other Topics Concern  . None   Social History Narrative    Family History: Family History  Problem Relation Age of Onset  . Diabetes Maternal Grandmother   . Hypertension Maternal Grandmother   . Cancer Maternal Grandfather     bladder and liver  . Crohn's disease Maternal Grandfather     Allergies: Allergies  Allergen Reactions  . Ibuprofen     REACTION: hives    Prescriptions prior to admission  Medication Sig Dispense Refill Last Dose  . albuterol (PROVENTIL HFA;VENTOLIN HFA) 108 (90 BASE) MCG/ACT inhaler Inhale 2 puffs into the lungs every 4 (four) hours as needed for wheezing. (Patient not taking: Reported on 08/31/2015) 1 Inhaler 2 Not Taking  . levothyroxine (SYNTHROID, LEVOTHROID) 25 MCG tablet 25 mcg daily.    Taking  . Prenatal Vit-Fe Sulfate-FA (PRENATAL VITAMIN PO) Take 1 tablet by mouth daily.  Taking     Review of Systems   All systems reviewed and negative except as stated in HPI  Blood pressure 126/76, pulse 90, temperature 98 F (36.7 C), temperature source Oral, resp. rate 18, last menstrual period 12/01/2014. General appearance: alert, cooperative, appears stated age and no distress Lungs: clear to auscultation bilaterally, no increased WOB Heart: regular rate and rhythm, no m/r/g Abdomen: soft, non-tender; bowel sounds normal Pelvic: Dilation: 5 Effacement (%): 70 Station: -2 Presentation: Vertex Exam by:: Passenger transport manager RN Extremities: WWP, Homans sign is negative, no sign of DVT, +2 DP Neuro: patellar DTRs 2/4 Presentation: cephalic Fetal monitoringBaseline: 130 bpm, Variability: Good {> 6 bpm),  Accelerations: Reactive and Decelerations: Absent Uterine activityFrequency: Every 2.5-3 minutes  Prenatal labs: ABO, Rh: B/Positive/-- (09/15 1629) Antibody: Negative (01/24 1008) Rubella: !Error!IMMUNE RPR: Non Reactive (01/24 1008)  HBsAg: Negative (09/15 1629)  HIV: Non Reactive (01/24 1008)  GBS: Negative (03/31 1100)  1 hr Glucola 80 Genetic screening  declined Anatomy US normal female  Prenatal Transfer Tool  Maternal Diabetes: No Genetic Screening: Declined Maternal Ultrasounds/Referrals: Normal Fetal Ultrasounds or other Referrals:  None Maternal Substance Abuse:  No Significant Maternal Medications:  Meds include: Syntroid Significant Maternal Lab Results: Lab values include: Group B Strep negative  Results for orders placed or performed in visit on 08/31/15 (from the past 24 hour(s))  POCT urinalysis dipstick   Collection Time: 08/31/15  3:38 PM  Result Value Ref Range   Color, UA     Clarity, UA     Glucose, UA neg    Bilirubin, UA     Ketones, UA neg    Spec Grav, UA     Blood, UA neg    pH, UA     Protein, UA trace    Urobilinogen, UA     Nitrite, UA neg    Leukocytes, UA Negative Negative    Patient Active Problem List   Diagnosis Date Noted  . Pruritic urticarial papules and plaques of pregnancy, antepartum 08/03/2015  . Hypothyroidism affecting pregnancy, antepartum 04/17/2015  . Supervision of normal pregnancy 02/02/2015  . Vaginal discharge 01/31/2015  . Gastroesophageal reflux disease with esophagitis 09/19/2014  . 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 Miscarriage 09/09/2013  . DYSPEPSIA&OTHER [facility] DISORDERS FUNCTION STOMACH 10/30/2009  . IBS 10/30/2009  . ANXIETY 10/26/2009  . ASTHMA 10/26/2009  . CONSTIPATION 10/26/2009    Assessment: Brianna Griffin is a 30 y.o. [phone] at [redacted]w[redacted]d here for onset of labor  #Labor: latent labor #Pain: Non pharmaceutical #FWB: Cat 1 #ID:  GBS neg #MOF: Breast #MOC: Depo #Circ:  Outpatient #hypothyroid:  continue levo 25 mcg qd  Brianna Griffin 09/01/2015, 12:38 AM  OB FELLOW HISTORY AND PHYSICAL ATTESTATION  I have seen and examined this patient; I agree with above documentation in the resident's note.    Brianna Lager Guenevere Roorda 09/01/2015, 3:27 AM

## 2015-09-01 NOTE — Anesthesia Procedure Notes (Signed)
Epidural Patient location during procedure: OB  Staffing Anesthesiologist: Montez Hageman Performed by: anesthesiologist   Preanesthetic Checklist Completed: patient identified, site marked, surgical consent, pre-op evaluation, timeout performed, IV checked, risks and benefits discussed and monitors and equipment checked  Epidural Patient position: sitting Prep: DuraPrep Patient monitoring: heart rate, continuous pulse ox and blood pressure Approach: right paramedian Location: L4-L5 Injection technique: LOR saline  Needle:  Needle type: Tuohy  Needle gauge: 17 G Needle length: 9 cm and 9 Needle insertion depth: 4 cm Catheter type: closed end flexible Catheter size: 20 Guage Catheter at skin depth: 8 cm Test dose: negative  Assessment Events: blood not aspirated, injection not painful, no injection resistance, negative IV test and no paresthesia  Additional Notes Patient identified. Risks/Benefits/Options discussed with patient including but not limited to bleeding, infection, nerve damage, paralysis, failed block, incomplete pain control, headache, blood pressure changes, nausea, vomiting, reactions to medication both or allergic, itching and postpartum back pain. Confirmed with bedside nurse the patient's most recent platelet count. Confirmed with patient that they are not currently taking any anticoagulation, have any bleeding history or any family history of bleeding disorders. Patient expressed understanding and wished to proceed. All questions were answered. Sterile technique was used throughout the entire procedure. Please see nursing notes for vital signs. Test dose was given through epidural needle and negative prior to continuing to dose epidural or start infusion. Warning signs of high block given to the patient including shortness of breath, tingling/numbness in hands, complete motor block, or any concerning symptoms with instructions to call for help. Patient was given  instructions on fall risk and not to get out of bed. All questions and concerns addressed with instructions to call with any issues.

## 2015-09-01 NOTE — Anesthesia Preprocedure Evaluation (Signed)
Anesthesia Evaluation  Patient identified by MRN, date of birth, ID band Patient awake    Reviewed: Allergy & Precautions, H&P , NPO status , Patient's Chart, lab work & pertinent test results  History of Anesthesia Complications Negative for: history of anesthetic complications  Airway Mallampati: II  TM Distance: >3 FB Neck ROM: full    Dental no notable dental hx. (+) Teeth Intact   Pulmonary asthma ,    Pulmonary exam normal breath sounds clear to auscultation       Cardiovascular negative cardio ROS Normal cardiovascular exam Rhythm:regular Rate:Normal     Neuro/Psych PSYCHIATRIC DISORDERS negative neurological ROS  negative psych ROS   GI/Hepatic negative GI ROS, Neg liver ROS,   Endo/Other  Hypothyroidism   Renal/GU negative Renal ROS  negative genitourinary   Musculoskeletal   Abdominal   Peds  Hematology negative hematology ROS (+)   Anesthesia Other Findings   Reproductive/Obstetrics (+) Pregnancy                             Anesthesia Physical Anesthesia Plan  ASA: II  Anesthesia Plan: Epidural   Post-op Pain Management:    Induction:   Airway Management Planned:   Additional Equipment:   Intra-op Plan:   Post-operative Plan:   Informed Consent: I have reviewed the patients History and Physical, chart, labs and discussed the procedure including the risks, benefits and alternatives for the proposed anesthesia with the patient or authorized representative who has indicated his/her understanding and acceptance.     Plan Discussed with:   Anesthesia Plan Comments:         Anesthesia Quick Evaluation

## 2015-09-01 NOTE — MAU Note (Signed)
Pt presents complaining of contractions every 2 minutes since 1700. 4cm in office today and had membranes stripped. Denies leaking. Some bloody show. Reports good fetal movement.

## 2015-09-01 NOTE — Lactation Note (Signed)
This note was copied from a baby's chart. Lactation Consultation Note  Patient Name: Boy Lanasha Sylve [id number] Date: 09/01/2015 Reason for consult: Initial assessment  Baby 6 hours old. Mom reports that baby has been nursing well. Mom states that her first child was a difficult latch and mom gave up early. However, mom states that her milk did "come in." Mom about to order dinner. Enc mom to nurse with cues and hand express just prior to latching. Mom confirmed that she was diagnosed with possible sub-clinical hypothyroidism--as she had 2 miscarriages. So, enc mom to follow-up with HCP about thyroid hormone levels d/t possible impact on breast milk supply. Mom aware of OP/BFSG and [place] phone line assistance after D/C. Enc mom to call for assistance with latching as needed.  Maternal Data Has patient been taught Hand Expression?: Yes (Per mom.) Does the patient have breastfeeding experience prior to this delivery?: Yes  Feeding Feeding Type: Breast Fed Length of feed: 30 min  LATCH Score/Interventions                      Lactation Tools Discussed/Used     Consult Status Consult Status: Follow-up Date: 09/02/15 Follow-up type: In-patient    MARIALICE, WAIT 09/01/2015, 8:18 PM

## 2015-09-01 NOTE — Progress Notes (Signed)
Will come down to see the patient shortly

## 2015-09-01 NOTE — Progress Notes (Signed)
MOB was referred for history of depression/anxiety. * Referral screened out by Clinical Social Worker because none of the following criteria appear to apply: ~ History of anxiety/depression during this pregnancy, or of post-partum depression. ~ Diagnosis of anxiety and/or depression within last 3 years OR * MOB's symptoms currently being treated with medication and/or therapy. Please contact the Clinical Social Worker if needs arise, or if MOB requests.   

## 2015-09-02 NOTE — Lactation Note (Addendum)
This note was copied from a baby's chart. Lactation Consultation Note Mom's 2nd baby but was unable to BF her 1st child who is now 30 yrs old d/t problem latching. Noted baby has tight upper labial frenulum, appears may have a posterior mid frenulum w/noted cuping of tongue and no movement of tongue past gum line. Mom stating baby has been Bf well. Encouraged to monitor I&O. Baby has had 4 stools and 1 void in 20 hrs. Would like to see another void before D/C home. Encouraged BF in different positions. Mom has everted nipples, cone shaped breast. Space between breast, no dx; of PCOS. Encouraged breast massage occasionally during BF, and post pump at intervals to build up milk supply. Encouraged support group and reminded of OP services.  Patient Name: Brianna Griffin [id number] Date: 09/02/2015 Reason for consult: Follow-up assessment   Maternal Data    Feeding Feeding Type: Breast Fed Length of feed: 15 min  LATCH Score/Interventions       Type of Nipple: Everted at rest and after stimulation  Comfort (Breast/Nipple): Filling, red/small blisters or bruises, mild/mod discomfort  Problem noted: Mild/Moderate discomfort Interventions (Mild/moderate discomfort): Hand massage;Hand expression  Intervention(s): Breastfeeding basics reviewed;Support Pillows;Position options;Skin to skin     Lactation Tools Discussed/Used     Consult Status Consult Status: Complete Date: 09/02/15    Theodoro Kalata 09/02/2015, 10:01 AM

## 2015-09-02 NOTE — Discharge Instructions (Signed)
You have elected for Depo for birth control.  Follow up with your doctor at [facility] in 4 weeks for follow up. Nothing per vagina for the next 6 weeks.  No intercourse, no tampons, no douching.    If you have had a c-section:  Your dressing should come off in the next 3-5 days.  If it does not come off on its own, you may manually remove it  If your dressing becomes very saturated or does not appear to be staying clean (some discharge/ dried blood is normal), you may remove it.  You can shower but should avoid bathing for now  If Non-Breastfeeding Wear a well-fitting bra for support.  Use ice packs to relieve discomfort from engorgement.  Avoid handling your breasts and do not express milk.  Non-breastfeeding engorgement will subside in 24-36 hours.  Uterine Changes After pains, or cramping, are normal. This cramping means that the uterus is contracting to return to its non-pregnant size. The uterus takes 5-6 weeks to return to its non-pregnant size. Vaginal Discharge Usually lasts about 10 days to 4 weeks. The color will change from bright red to brownish to tan and will become less in amount and finally disappear.  Menstruation: your period will resume in approximately 6-8 weeks, unless breastfeeding. Activity Rest! Do not do heavy housework or heavy exercise for two weeks. Avoid driving for 1-2 weeks. Check with your doctor for limitations on activities if you have had a C-Section.  Avoid sexual activity, douching or tampons until your postpartum visit.  Postpartum Care After Vaginal Delivery After you deliver your newborn (postpartum period), the usual stay in the hospital is 24-72 hours. If there were problems with your labor or delivery, or if you have other medical problems, you might be in the hospital longer.  While you are in the hospital, you will receive help and instructions on how to care for yourself and your newborn during the postpartum period.  While you are in the  hospital:  Be sure to tell your nurses if you have pain or discomfort, as well as where you feel the pain and what makes the pain worse.  If you had an incision made near your vagina (episiotomy) or if you had some tearing during delivery, the nurses may put ice packs on your episiotomy or tear. The ice packs may help to reduce the pain and swelling.  If you are breastfeeding, you may feel uncomfortable contractions of your uterus for a couple of weeks. This is normal. The contractions help your uterus get back to normal size.  It is normal to have some bleeding after delivery.  For the first 1-3 days after delivery, the flow is red and the amount may be similar to a period.  It is common for the flow to start and stop.  In the first few days, you may pass some small clots. Let your nurses know if you begin to pass large clots or your flow increases.  Do not  flush blood clots down the toilet before having the nurse look at them.  During the next 3-10 days after delivery, your flow should become more watery and pink or brown-tinged in color.  Ten to fourteen days after delivery, your flow should be a small amount of yellowish-white discharge.  The amount of your flow will decrease over the first few weeks after delivery. Your flow may stop in 6-8 weeks. Most women have had their flow stop by 12 weeks after delivery.  You  should change your sanitary pads frequently.  Wash your hands thoroughly with soap and water for at least 20 seconds after changing pads, using the toilet, or before holding or feeding your newborn.  You should feel like you need to empty your bladder within the first 6-8 hours after delivery.  In case you become weak, lightheaded, or faint, call your nurse before you get out of bed for the first time and before you take a shower for the first time.  Within the first few days after delivery, your breasts may begin to feel tender and full. This is called engorgement.  Breast tenderness usually goes away within 48-72 hours after engorgement occurs. You may also notice milk leaking from your breasts. If you are not breastfeeding, do not stimulate your breasts. Breast stimulation can make your breasts produce more milk.  Spending as much time as possible with your newborn is very important. During this time, you and your newborn can feel close and get to know each other. Having your newborn stay in your room (rooming in) will help to strengthen the bond with your newborn. It will give you time to get to know your newborn and become comfortable caring for your newborn.  Your hormones change after delivery. Sometimes the hormone changes can temporarily cause you to feel sad or tearful. These feelings should not last more than a few days. If these feelings last longer than that, you should talk to your caregiver.  If desired, talk to your caregiver about methods of family planning or contraception.  Talk to your caregiver about immunizations. Your caregiver may want you to have the following immunizations before leaving the hospital:  Tetanus, diphtheria, and pertussis (Tdap) or tetanus and diphtheria (Td) immunization. It is very important that you and your family (including grandparents) or others caring for your newborn are up-to-date with the Tdap or Td immunizations. The Tdap or Td immunization can help protect your newborn from getting ill.  Rubella immunization.  Varicella (chickenpox) immunization.  Influenza immunization. You should receive this annual immunization if you did not receive the immunization during your pregnancy.   This information is not intended to replace advice given to you by your health care provider. Make sure you discuss any questions you have with your health care provider.   Document Released: 03/03/2007 Document Revised: 01/29/2012 Document Reviewed: 01/01/2012 Elsevier Interactive Patient Education [employer].

## 2015-09-02 NOTE — Anesthesia Postprocedure Evaluation (Signed)
Anesthesia Post Note  Patient: Brianna Griffin  Procedure(s) Performed: * No procedures listed *  Patient location during evaluation: Mother Baby Anesthesia Type: Epidural Level of consciousness: awake and alert Pain management: pain level controlled Vital Signs Assessment: post-procedure vital signs reviewed and stable Respiratory status: spontaneous breathing Cardiovascular status: stable Postop Assessment: patient able to bend at knees, no signs of nausea or vomiting and adequate PO intake Anesthetic complications: no    Last Vitals:  Filed Vitals:   09/01/15 1811 09/02/15 0623  BP: 115/80 101/67  Pulse: 82 80  Temp: 36.7 C 36.7 C  Resp: 18 18    Last Pain:  Filed Vitals:   09/02/15 0624  PainSc: Asleep                 Armon Orvis Hristova

## 2015-09-02 NOTE — Discharge Summary (Signed)
OB Discharge Summary     Patient Name: Brianna Griffin DOB: [birth year] MRN: [id number]  Date of admission: 08/31/2015 Delivering MD: Lauretta Chester NILES   Date of discharge: 09/02/2015  Admitting diagnosis: 39wks, contractions Intrauterine pregnancy: [redacted]w[redacted]d     Secondary diagnosis:  Principal Problem:   NSVD (normal spontaneous vaginal delivery) Active Problems:   Asthma, chronic   Supervision of normal pregnancy   Hypothyroidism affecting pregnancy, antepartum  Additional problems: none     Discharge diagnosis: Term Pregnancy Delivered and hypothyroidism                                                                                                Post partum procedures:none  Augmentation: none  Complications: None  Hospital course:  Onset of Labor With Vaginal Delivery     30 y.o. yo [id number] at [redacted]w[redacted]d was admitted in Active Labor on 08/31/2015. Patient had an uncomplicated labor course as follows:  Membrane Rupture Time/Date: 6:15 AM ,09/01/2015   Intrapartum Procedures: Episiotomy: None [1]                                         Lacerations:  None [1]  Patient had a delivery of a Viable infant. 09/01/2015  Information for the patient's newborn:  Tyriel, Kargbo [id number]  Delivery Method: Vaginal, Spontaneous Delivery (Filed from Delivery Summary)    Pateint had an uncomplicated postpartum course.  She is ambulating, tolerating a regular diet, passing flatus, and urinating well. Patient is discharged home in stable condition on 09/02/2015.    Physical exam  Filed Vitals:   09/01/15 1325 09/01/15 1423 09/01/15 1811 09/02/15 0623  BP: 112/60 109/74 115/80 101/67  Pulse: 80 76 82 80  Temp: 99 F (37.2 C) 98.8 F (37.1 C) 98.1 F (36.7 C) 98.1 F (36.7 C)  TempSrc: Oral Oral Oral Oral  Resp: 18 16 18 18   Height:      Weight:      SpO2:       General: alert, cooperative and no distress Lochia: appropriate Uterine Fundus: firm Incision: N/A DVT  Evaluation: No evidence of DVT seen on physical exam. Negative Homan's sign. No cords or calf tenderness. No significant calf/ankle edema. Labs: Lab Results  Component Value Date   WBC 21.9* 09/01/2015   HGB 11.0* 09/01/2015   HCT 31.1* 09/01/2015   MCV 89.6 09/01/2015   PLT 142* 09/01/2015   No flowsheet data found.  Discharge instruction: per After Visit Summary and "Baby and Me Booklet".  After visit meds:    Medication List    TAKE these medications        albuterol 108 (90 Base) MCG/ACT inhaler  Commonly known as:  PROVENTIL HFA;VENTOLIN HFA  Inhale 2 puffs into the lungs every 4 (four) hours as needed for wheezing.     levothyroxine 25 MCG tablet  Commonly known as:  SYNTHROID, LEVOTHROID  Take 25 mcg by mouth daily.     PRENATAL VITAMIN PO  Take 1 tablet  by mouth daily.        Diet: routine diet  Activity: Advance as tolerated. Pelvic rest for 6 weeks.   Outpatient follow up:6 weeks Follow up Appt:Future Appointments Date Time Provider [place]  09/07/2015 9:15 AM Christin Fudge, CNM [facility]   Follow up Visit:No Follow-up on file.  Postpartum contraception: Depo Provera  Newborn Data: Live born female  Birth Weight: 7 lb 7.4 oz (3385 g) APGAR: 9, 9  Baby Feeding: Breast Disposition:home with mother   09/02/2015 [name], DO   I was present for the exam and agree with above.  Moyers, [place] 09/03/2015 10:07 AM

## 2015-09-06 ENCOUNTER — Ambulatory Visit (INDEPENDENT_AMBULATORY_CARE_PROVIDER_SITE_OTHER): Payer: BLUE CROSS/BLUE SHIELD | Admitting: Obstetrics & Gynecology

## 2015-09-06 ENCOUNTER — Encounter: Payer: Self-pay | Admitting: Obstetrics & Gynecology

## 2015-09-06 VITALS — BP 110/60 | HR 64 | Wt 142.0 lb

## 2015-09-06 DIAGNOSIS — L739 Follicular disorder, unspecified: Secondary | ICD-10-CM | POA: Diagnosis not present

## 2015-09-06 MED ORDER — CEPHALEXIN 500 MG PO CAPS: 500.0000 mg | ORAL_CAPSULE | Freq: Three times a day (TID) | ORAL | Status: DC

## 2015-09-06 NOTE — Progress Notes (Signed)
Patient ID: Brianna Griffin, female   DOB: Apr 08, 1986, 30 y.o.   MRN: [id number]      Chief Complaint  Patient presents with  . gyn visit    painful area upper rt thign    Blood pressure 110/60, pulse 64, weight 142 lb (64.411 kg), unknown if currently breastfeeding.  30 y.o. [id number] No LMP recorded. The current method of family planning is breast feeding just delivered.  Subjective Pt with tender swollen area inside right thigh, been there for a while  Objective folliculits right inner thigh  Pertinent ROS   Labs or studies     Impression Diagnoses this Encounter::   ICD-9-CM ICD-10-CM   1. Folliculitis 123XX123 123XX123     Established relevant diagnosis(es):   Plan/Recommendations: Meds ordered this encounter  Medications  . cephALEXin (KEFLEX) 500 MG capsule    Sig: Take 1 capsule (500 mg total) by mouth 3 (three) times daily.    Dispense:  21 capsule    Refill:  0    Labs or Scans Ordered: No orders of the defined types were placed in this encounter.    Management:: Will remove in 5 weeks if persists  Follow up Return in about 5 weeks (around 10/11/2015) for post partum visit, with Dr Elonda Husky.      All questions were answered.

## 2015-09-07 ENCOUNTER — Encounter: Payer: BLUE CROSS/BLUE SHIELD | Admitting: Advanced Practice Midwife

## 2015-10-02 ENCOUNTER — Encounter: Payer: Self-pay | Admitting: Obstetrics & Gynecology

## 2015-10-17 ENCOUNTER — Ambulatory Visit (INDEPENDENT_AMBULATORY_CARE_PROVIDER_SITE_OTHER): Payer: BLUE CROSS/BLUE SHIELD | Admitting: Obstetrics & Gynecology

## 2015-10-17 ENCOUNTER — Encounter: Payer: Self-pay | Admitting: Obstetrics & Gynecology

## 2015-10-17 MED ORDER — MEDROXYPROGESTERONE ACETATE 150 MG/ML IM SUSP: 150.0000 mg | INTRAMUSCULAR | Status: DC

## 2015-10-17 NOTE — Progress Notes (Signed)
Patient ID: Brianna Griffin, female   DOB: [birth year], 30 y.o.   MRN: [id number] post Subjective:     Brianna Griffin is a 30 y.o. female who presents for a postpartum visit. She is 6 weeks postpartum following a spontaneous vaginal delivery. I have fully reviewed the prenatal and intrapartum course. The delivery was at 80 gestational weeks. Outcome: spontaneous vaginal delivery. Anesthesia: epidural. Postpartum course has been unremarkable. Baby's course has been normal. Baby is feeding by breast. Bleeding no bleeding. Bowel function is normal. Bladder function is normal. Patient is not sexually active. Contraception method is Depo-Provera injections. Postpartum depression screening: negative.  The following portions of the patient's history were reviewed and updated as appropriate: allergies, current medications, past family history, past medical history, past social history, past surgical history and problem list.  Review of Systems Pertinent items are noted in HPI.   Objective:    BP 110/70 mmHg  Pulse 74  Wt 134 lb (60.782 kg)  General:  alert, cooperative and no distress   Breasts:    Lungs:   Heart:    Abdomen: soft, non-tender; bowel sounds normal; no masses,  no organomegaly   Vulva:  normal  Vagina: normal vagina  Cervix:  no cervical motion tenderness  Corpus: normal size, contour, position, consistency, mobility, non-tender  Adnexa:  normal adnexa  Rectal Exam:         Assessment:     normal postpartum exam. Pap smear not done at today's visit.   Plan:    1. Contraception: Depo-Provera injections 2. Yearly 6 months 3. Follow up in: 6 months or as needed.

## 2015-10-18 ENCOUNTER — Other Ambulatory Visit: Payer: Self-pay | Admitting: Obstetrics & Gynecology

## 2015-10-18 ENCOUNTER — Encounter: Payer: Self-pay | Admitting: Obstetrics & Gynecology

## 2015-10-18 MED ORDER — LEVOTHYROXINE SODIUM 25 MCG PO TABS: 25.0000 ug | ORAL_TABLET | Freq: Every day | ORAL | Status: DC

## 2015-10-19 ENCOUNTER — Ambulatory Visit (INDEPENDENT_AMBULATORY_CARE_PROVIDER_SITE_OTHER): Payer: BLUE CROSS/BLUE SHIELD | Admitting: *Deleted

## 2015-10-19 ENCOUNTER — Encounter: Payer: Self-pay | Admitting: *Deleted

## 2015-10-19 DIAGNOSIS — Z3202 Encounter for pregnancy test, result negative: Secondary | ICD-10-CM | POA: Diagnosis not present

## 2015-10-19 DIAGNOSIS — Z30013 Encounter for initial prescription of injectable contraceptive: Secondary | ICD-10-CM | POA: Diagnosis not present

## 2015-10-19 DIAGNOSIS — Z3200 Encounter for pregnancy test, result unknown: Secondary | ICD-10-CM

## 2015-10-19 LAB — POCT URINE PREGNANCY: PREG TEST UR: NEGATIVE

## 2015-10-19 MED ORDER — MEDROXYPROGESTERONE ACETATE 150 MG/ML IM SUSP
150.0000 mg | Freq: Once | INTRAMUSCULAR | Status: AC
  Administered 2015-10-19: 150 mg via INTRAMUSCULAR

## 2015-10-19 NOTE — Progress Notes (Signed)
Patient ID: Brianna Griffin, female   DOB: 05-24-1985, 30 y.o.   MRN: [id number] Pt given DEPO injection and tolerated well.

## 2015-11-13 ENCOUNTER — Ambulatory Visit (INDEPENDENT_AMBULATORY_CARE_PROVIDER_SITE_OTHER): Payer: BLUE CROSS/BLUE SHIELD | Admitting: Women's Health

## 2015-11-13 ENCOUNTER — Encounter: Payer: Self-pay | Admitting: Women's Health

## 2015-11-13 VITALS — BP 122/72 | HR 60 | Wt 131.0 lb

## 2015-11-13 DIAGNOSIS — N9089 Other specified noninflammatory disorders of vulva and perineum: Secondary | ICD-10-CM | POA: Diagnosis not present

## 2015-11-13 DIAGNOSIS — F418 Other specified anxiety disorders: Secondary | ICD-10-CM | POA: Diagnosis not present

## 2015-11-13 DIAGNOSIS — N921 Excessive and frequent menstruation with irregular cycle: Secondary | ICD-10-CM

## 2015-11-13 LAB — POCT WET PREP (WET MOUNT): Clue Cells Wet Prep Whiff POC: NEGATIVE

## 2015-11-13 MED ORDER — NYSTATIN-TRIAMCINOLONE 100000-0.1 UNIT/GM-% EX OINT: 1.0000 "application " | TOPICAL_OINTMENT | Freq: Two times a day (BID) | CUTANEOUS | Status: DC

## 2015-11-13 MED ORDER — ESCITALOPRAM OXALATE 10 MG PO TABS: 10.0000 mg | ORAL_TABLET | Freq: Every day | ORAL | Status: DC

## 2015-11-13 NOTE — Progress Notes (Signed)
Patient ID: [name], female   DOB: 06-09-1985, 30 y.o.   MRN: [id number]   [facility] Visit  Patient name: Brianna Griffin  Date of birth: 10/27/1985  CC & HPI:  Brianna Griffin is a 30 y.o. [id number] Caucasian female ~9wks s/p SVB presenting today for report of feeling like she needs to get back on antidepressants. Has h/o depression/anxiety, states pp depression screening has been normal, but now feels like she's not doing as well. Has been on multiple different meds in past, prozac was the last. Has never tried Lexapro. Is breastfeeding. Denies SI/HI/II, is sleeping and eating well, still finds joy in things she used to. EPDS today: 11. Also requests referral to counselor- has seen a few in [place] in past but didn't have good connection w/ them. Found [facility] and plans to call them.  1st depo 6/1- began bleeding 6/10 and hasn't stopped, tampons painful to use 'feel like razorblades', burning around clitoris. Denies abnormal d/c, itching/odor. Has not had sex since baby. No lacs w/ delivery.  No LMP recorded. Patient has had an injection. The current method of family planning is Depo-Provera injections. Last pap 03/04/14 and was neg  Pertinent History Reviewed:  Medical & Surgical Hx:   Past medical, surgical, family, and social history reviewed in electronic medical record Medications: Reviewed & Updated - see associated section Allergies: Reviewed in electronic medical record  Objective Findings:  Vitals: BP 122/72 mmHg  Pulse 60  Wt 131 lb (59.421 kg)  Breastfeeding? Yes Body mass index is 22.47 kg/(m^2).  Physical Examination: General appearance - alert, well appearing, and in no distress Pelvic - normal external genitalia- no abnormalities noted around clitoris, cx closed, scant amt menstrual blood, no d/c seen, wet prep from vag walls obtained  Results for orders placed or performed in visit on 11/13/15 (from the past 24 hour(s))  POCT Wet  Prep Lenard Forth Barry)   Collection Time: 11/13/15  1:11 PM  Result Value Ref Range   Source Wet Prep POC vaginal    WBC, Wet Prep HPF POC none    Bacteria Wet Prep HPF POC None None, Few, Too numerous to count   BACTERIA WET PREP MORPHOLOGY POC     Clue Cells Wet Prep HPF POC None None, Too numerous to count   Clue Cells Wet Prep Whiff POC Negative Whiff    Yeast Wet Prep HPF POC None    KOH Wet Prep POC     Trichomonas Wet Prep HPF POC none      Assessment & Plan:  A:   Depression/anxiety  Bleeding after depo  Vulvar irritation  P:  Rx Lexapro 10mg  daily, understands can take a few weeks to begin working  To call places of her choice for counseling, also gave number to [facility] in case unable to find somewhere she likes  Rx mytrex cream for vulvar irritation- let us know if not improving  Give bleeding from depo some time to resolve on own, sometimes can take a few shots to regulate, let us know if desires meds to help (megace)  Return in about 4 weeks (around 12/11/2015) for F/U.  Tawnya Crook CNM, [facility] 11/13/2015 1:12 PM

## 2015-11-13 NOTE — Patient Instructions (Addendum)
Dr. Letta Moynahan [phone]  Samul Dada [place]

## 2015-11-23 ENCOUNTER — Telehealth: Payer: Self-pay | Admitting: *Deleted

## 2015-11-23 NOTE — Telephone Encounter (Signed)
Work note completed and faxed. Pt aware. [place]

## 2015-11-23 NOTE — Telephone Encounter (Signed)
Left message x 1. JSY 

## 2015-12-12 ENCOUNTER — Ambulatory Visit (INDEPENDENT_AMBULATORY_CARE_PROVIDER_SITE_OTHER): Payer: BLUE CROSS/BLUE SHIELD | Admitting: Women's Health

## 2015-12-12 ENCOUNTER — Encounter: Payer: Self-pay | Admitting: Women's Health

## 2015-12-12 VITALS — BP 122/72 | HR 68 | Wt 133.0 lb

## 2015-12-12 DIAGNOSIS — F418 Other specified anxiety disorders: Secondary | ICD-10-CM

## 2015-12-12 NOTE — Progress Notes (Signed)
Patient ID: Brianna Griffin, female   DOB: 05-Aug-1985, 30 y.o.   MRN: [id number]   [facility] Visit  Patient name: Brianna Griffin MRN [id number]  Date of birth: 1986-01-24  CC & HPI:  Brianna Griffin is a 30 y.o. [id number] Caucasian female presenting today for f/u on Lexapro 10mg  daily rx'd 11/13/15 by me for depression/anxiety at 9wks pp. Has h/o same. Feels like things are definitely improving w/ Lexapro. Denies SI/HI/II.Did call [facility] in [place] and has appt on Monday for counseling.  No LMP recorded. Patient has had an injection. The current method of family planning is Depo-Provera injections. Last pap 02/2014, normal  Pertinent History Reviewed:  Medical & Surgical Hx:   Past medical, surgical, family, and social history reviewed in electronic medical record Medications: Reviewed & Updated - see associated section Allergies: Reviewed in electronic medical record  Objective Findings:  Vitals: BP 122/72 (BP Location: Right Arm, Patient Position: Sitting)   Pulse 68   Wt 133 lb (60.3 kg)   BMI 22.83 kg/m  Body mass index is 22.83 kg/m.  Physical Examination: General appearance - alert, well appearing, and in no distress  No results found for this or any previous visit (from the past 24 hour(s)).   Assessment & Plan:  A:   Depression/anxiety, f/u on meds  P:  Continue Lexapro 10mg  daily  [facility]  Return for make 11/30 appt for pap & physical (not gyn). f/u sooner if needed  Keep next depo appt as scheduled  Tawnya Crook CNM, [facility] 12/12/2015 3:40 PM

## 2016-01-08 ENCOUNTER — Encounter: Payer: Self-pay | Admitting: *Deleted

## 2016-01-08 ENCOUNTER — Ambulatory Visit (INDEPENDENT_AMBULATORY_CARE_PROVIDER_SITE_OTHER): Payer: BLUE CROSS/BLUE SHIELD | Admitting: *Deleted

## 2016-01-08 DIAGNOSIS — Z3042 Encounter for surveillance of injectable contraceptive: Secondary | ICD-10-CM | POA: Diagnosis not present

## 2016-01-08 DIAGNOSIS — Z3202 Encounter for pregnancy test, result negative: Secondary | ICD-10-CM | POA: Diagnosis not present

## 2016-01-08 LAB — POCT URINE PREGNANCY: Preg Test, Ur: NEGATIVE

## 2016-01-08 MED ORDER — MEDROXYPROGESTERONE ACETATE 150 MG/ML IM SUSP
150.0000 mg | Freq: Once | INTRAMUSCULAR | Status: AC
  Administered 2016-01-08: 150 mg via INTRAMUSCULAR

## 2016-01-08 NOTE — Progress Notes (Signed)
Pt here for Depo. Pt tolerated shot well. Return in 12 weeks for next shot. JSY 

## 2016-01-11 ENCOUNTER — Ambulatory Visit: Payer: BLUE CROSS/BLUE SHIELD

## 2016-02-06 ENCOUNTER — Encounter: Payer: Self-pay | Admitting: Family Medicine

## 2016-02-06 ENCOUNTER — Ambulatory Visit (INDEPENDENT_AMBULATORY_CARE_PROVIDER_SITE_OTHER): Payer: BLUE CROSS/BLUE SHIELD | Admitting: Family Medicine

## 2016-02-06 VITALS — BP 110/70 | Temp 98.1°F | Ht 64.0 in | Wt 131.1 lb

## 2016-02-06 DIAGNOSIS — J019 Acute sinusitis, unspecified: Secondary | ICD-10-CM | POA: Diagnosis not present

## 2016-02-06 DIAGNOSIS — H6502 Acute serous otitis media, left ear: Secondary | ICD-10-CM | POA: Diagnosis not present

## 2016-02-06 DIAGNOSIS — B9689 Other specified bacterial agents as the cause of diseases classified elsewhere: Secondary | ICD-10-CM

## 2016-02-06 MED ORDER — AMOXICILLIN 500 MG PO TABS: 500.0000 mg | ORAL_TABLET | Freq: Three times a day (TID) | ORAL | 0 refills | Status: DC

## 2016-02-06 NOTE — Progress Notes (Signed)
   Subjective:    Patient ID: Brianna Griffin, female    DOB: April 20, 1986, 30 y.o.   MRN: [id number]  Sinusitis  This is a new problem. The current episode started in the past 7 days. The problem is unchanged. There has been no fever. The pain is moderate. Associated symptoms include coughing, ear pain, headaches and a sore throat. Past treatments include spray decongestants. The treatment provided no relief.  She relates head congestion sinus pressure ear pain symptoms over the past 5-7 days worse over the past couple days woke her up in the evening denies sweats chills vomiting diarrhea    Review of Systems  HENT: Positive for ear pain and sore throat.   Respiratory: Positive for cough.   Neurological: Positive for headaches.     patient is breast-feeding  Objective:   Physical Exam   left eardrum red right eardrum normal nears minimal crusting throat normal neck supple lungs clear heart regular    mild sinus tenderness     Assessment & Plan:   Viral syndrome Secondary rhinosinusitis Left otitis media Antibiotics prescribed warning signs discussed Chosen antibiotic that should get along with breast-feeding Avoid decongestants If high fevers chills or worse follow-up immediately.

## 2016-02-07 ENCOUNTER — Other Ambulatory Visit: Payer: Self-pay | Admitting: Nurse Practitioner

## 2016-02-08 ENCOUNTER — Telehealth: Payer: Self-pay | Admitting: Family Medicine

## 2016-02-08 MED ORDER — CEFDINIR 300 MG PO CAPS: ORAL_CAPSULE | ORAL | 0 refills | Status: DC

## 2016-02-08 NOTE — Telephone Encounter (Signed)
Spoke with patient and informed her per Dr.Steve Luking- Omnicef 1 tablet twice a day for 10 days was sent into [facility]. Patient verbalized understanding.

## 2016-02-08 NOTE — Telephone Encounter (Signed)
omnicef 300 bid ten d 

## 2016-02-08 NOTE — Telephone Encounter (Signed)
Patient was seen on 02/06/16 by Dr. Nicki Reaper for rhino sinusitis.  She says she feels she is worse now, with new symptoms of fever off and on, she says its almost pink eye symptoms that are worse at night with discharge and redness when she wakes up during the night. She wants to know if the doctor thinks the antibiotic needs to be changed?  Larene Pickett

## 2016-02-09 ENCOUNTER — Encounter: Payer: Self-pay | Admitting: Family Medicine

## 2016-02-09 ENCOUNTER — Telehealth: Payer: Self-pay | Admitting: Family Medicine

## 2016-02-09 NOTE — Telephone Encounter (Signed)
Patient was seen on 9/19 and still sick needing work excuse for the 9/19 thru 9/22 returning to work 9/25

## 2016-02-09 NOTE — Telephone Encounter (Signed)
She may have this

## 2016-02-12 NOTE — Telephone Encounter (Signed)
Done

## 2016-02-14 ENCOUNTER — Encounter: Payer: Self-pay | Admitting: Obstetrics & Gynecology

## 2016-02-27 ENCOUNTER — Telehealth: Payer: Self-pay | Admitting: Family Medicine

## 2016-02-27 ENCOUNTER — Other Ambulatory Visit: Payer: Self-pay | Admitting: Family Medicine

## 2016-02-27 MED ORDER — CEFDINIR 300 MG PO CAPS: ORAL_CAPSULE | ORAL | 0 refills | Status: DC

## 2016-02-27 NOTE — Telephone Encounter (Signed)
I discussed the case with the patient she is having dysuria urinary for disease she is not having fever flank pain nausea or vomiting. She is breast-feeding. Infant in the past she has tolerated Omnicef. Therefore we'll send in [place] 300 mg 1 twice a day for 7 days to [facility] if she starts having fever severe flank pain or other complication she must follow-up immediately

## 2016-02-27 NOTE — Telephone Encounter (Signed)
Pt has a long history of frequent UTI's  Pt states that Dr. Richardson Landry has in the past called in antibiotics for her for this or NTBS  Early this morning pt started having cloudy, smelly urine with burning and frequency throughout the day  No known fever, no chills, no severe pain  Please advise   Reader

## 2016-04-01 ENCOUNTER — Ambulatory Visit: Payer: BLUE CROSS/BLUE SHIELD

## 2016-04-05 ENCOUNTER — Ambulatory Visit (INDEPENDENT_AMBULATORY_CARE_PROVIDER_SITE_OTHER): Payer: BLUE CROSS/BLUE SHIELD | Admitting: Obstetrics & Gynecology

## 2016-04-05 ENCOUNTER — Encounter: Payer: Self-pay | Admitting: Obstetrics & Gynecology

## 2016-04-05 VITALS — BP 128/80 | Ht 64.0 in | Wt 129.0 lb

## 2016-04-05 DIAGNOSIS — Z01419 Encounter for gynecological examination (general) (routine) without abnormal findings: Secondary | ICD-10-CM

## 2016-04-05 MED ORDER — DESOGESTREL-ETHINYL ESTRADIOL 0.15-30 MG-MCG PO TABS: 1.0000 | ORAL_TABLET | Freq: Every day | ORAL | 11 refills | Status: DC

## 2016-04-05 MED ORDER — ESCITALOPRAM OXALATE 20 MG PO TABS: 10.0000 mg | ORAL_TABLET | Freq: Every day | ORAL | 11 refills | Status: DC

## 2016-04-05 NOTE — Progress Notes (Signed)
Subjective:     Brianna Griffin is a 30 y.o. female here for a routine exam.  No LMP recorded. Patient has had an injection. [id number] Birth Control Method:  Depo wants to switch to OCP Menstrual Calendar(currently): irregular spotting  Current complaints: spotting.   Current acute medical issues:  none   Recent Gynecologic History No LMP recorded. Patient has had an injection. Last Pap: 2016,  [place] lLast mammogram: ,    Past Medical History:  Diagnosis Date  . Allergy   . Anxiety   . Asthma   . Depression   . Gastritis   . GERD (gastroesophageal reflux disease)   . 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 IBS (irritable bowel syndrome)   . Migraine headache   . Migraines   . Miscarriage 09/09/2013  . Pregnant 12/28/2014  . Supervision of normal pregnancy in first trimester 03/04/2014  . Supervision of other normal pregnancy 02/02/2015  . Thyroid disease    hypothyroid  . Vaginal discharge 01/31/2015    History reviewed. No pertinent surgical history.  OB History    Gravida Para Term Preterm AB Living   4 2 2   2 2    SAB TAB Ectopic Multiple Live Births   2     0 2      Social History   Social History  . Marital status: Married    Spouse name: N/A  . Number of children: N/A  . Years of education: N/A   Social History Main Topics  . Smoking status: Never Smoker  . Smokeless tobacco: Never Used  . Alcohol use No  . Drug use: No  . Sexual activity: Not Currently    Birth control/ protection: None, Injection   Other Topics Concern  . None   Social History Narrative  . None    Family History  Problem Relation Age of Onset  . Diabetes Maternal Grandmother   . Hypertension Maternal Grandmother   . Cancer Maternal Grandfather     bladder and liver  . Crohn's disease Maternal Grandfather      Current Outpatient Prescriptions:  .  Albuterol Sulfate (VENTOLIN HFA IN), Inhale into the lungs as needed., Disp: , Rfl:  .  escitalopram (LEXAPRO) 20 MG tablet, Take 0.5  tablets (10 mg total) by mouth daily., Disp: 30 tablet, Rfl: 11 .  medroxyPROGESTERone (DEPO-PROVERA) 150 MG/ML injection, Inject 1 mL (150 mg total) into the muscle every 3 (three) months., Disp: 1 mL, Rfl: 3 .  VENTOLIN HFA 108 (90 Base) MCG/ACT inhaler, INHALE 2 PUFFS EVERY 4 HOURS AS NEEDED FOR WHEEZING., Disp: 18 g, Rfl: 0 .  amoxicillin (AMOXIL) 500 MG tablet, Take 1 tablet (500 mg total) by mouth 3 (three) times daily. (Patient not taking: Reported on 04/05/2016), Disp: 30 tablet, Rfl: 0 .  cefdinir (OMNICEF) 300 MG capsule, Take 1 tablet by mouth twice a day for 7 days (Patient not taking: Reported on 04/05/2016), Disp: 14 capsule, Rfl: 0 .  desogestrel-ethinyl estradiol (APRI,EMOQUETTE,SOLIA) 0.15-30 MG-MCG tablet, Take 1 tablet by mouth daily., Disp: 1 Package, Rfl: 11  Review of Systems  Review of Systems  Constitutional: Negative for fever, chills, weight loss, malaise/fatigue and diaphoresis.  HENT: Negative for hearing loss, ear pain, nosebleeds, congestion, sore throat, neck pain, tinnitus and ear discharge.   Eyes: Negative for blurred vision, double vision, photophobia, pain, discharge and redness.  Respiratory: Negative for cough, hemoptysis, sputum production, shortness of breath, wheezing and stridor.   Cardiovascular: Negative for chest pain, palpitations, orthopnea,  claudication, leg swelling and PND.  Gastrointestinal: negative for abdominal pain. Negative for heartburn, nausea, vomiting, diarrhea, constipation, blood in stool and melena.  Genitourinary: Negative for dysuria, urgency, frequency, hematuria and flank pain.  Musculoskeletal: Negative for myalgias, back pain, joint pain and falls.  Skin: Negative for itching and rash.  Neurological: Negative for dizziness, tingling, tremors, sensory change, speech change, focal weakness, seizures, loss of consciousness, weakness and headaches.  Endo/Heme/Allergies: Negative for environmental allergies and polydipsia. Does not  bruise/bleed easily.  Psychiatric/Behavioral: Negative for depression, suicidal ideas, hallucinations, memory loss and substance abuse. The patient is not nervous/anxious and does not have insomnia.        Objective:  Blood pressure 128/80, height 5\' 4"  (1.626 m), weight 129 lb (58.5 kg), currently breastfeeding.   Physical Exam  Vitals reviewed. Constitutional: She is oriented to person, place, and time. She appears well-developed and well-nourished.  HENT:  Head: Normocephalic and atraumatic.        Right Ear: External ear normal.  Left Ear: External ear normal.  Nose: Nose normal.  Mouth/Throat: Oropharynx is clear and moist.  Eyes: Conjunctivae and EOM are normal. Pupils are equal, round, and reactive to light. Right eye exhibits no discharge. Left eye exhibits no discharge. No scleral icterus.  Neck: Normal range of motion. Neck supple. No tracheal deviation present. No thyromegaly present.  Cardiovascular: Normal rate, regular rhythm, normal heart sounds and intact distal pulses.  Exam reveals no gallop and no friction rub.   No murmur heard. Respiratory: Effort normal and breath sounds normal. No respiratory distress. She has no wheezes. She has no rales. She exhibits no tenderness.  GI: Soft. Bowel sounds are normal. She exhibits no distension and no mass. There is no tenderness. There is no rebound and no guarding.  Genitourinary:  Breasts no masses skin changes or nipple changes bilaterally      Vulva is normal without lesions Vagina is pink moist without discharge Cervix normal in appearance and pap is done Uterus is normal size shape and contour Adnexa is negative with normal sized ovaries   Musculoskeletal: Normal range of motion. She exhibits no edema and no tenderness.  Neurological: She is alert and oriented to person, place, and time. She has normal reflexes. She displays normal reflexes. No cranial nerve deficit. She exhibits normal muscle tone. Coordination normal.   Skin: Skin is warm and dry. No rash noted. No erythema. No pallor.  Psychiatric: She has a normal mood and affect. Her behavior is normal. Judgment and thought content normal.       Medications Ordered at today's visit: Meds ordered this encounter  Medications  . desogestrel-ethinyl estradiol (APRI,EMOQUETTE,SOLIA) 0.15-30 MG-MCG tablet    Sig: Take 1 tablet by mouth daily.    Dispense:  1 Package    Refill:  11  . escitalopram (LEXAPRO) 20 MG tablet    Sig: Take 0.5 tablets (10 mg total) by mouth daily.    Dispense:  30 tablet    Refill:  11    Other orders placed at today's visit: No orders of the defined types were placed in this encounter.     Assessment:    Healthy female exam.    Plan:    begin desogestrel OCP with 30 mics of EE    Increase lexapro to 20 daily   Return in about 1 year (around 04/05/2017) for yearly, with Dr Elonda Husky.

## 2016-04-12 LAB — CYTOLOGY - PAP

## 2016-04-18 ENCOUNTER — Encounter: Payer: Self-pay | Admitting: Obstetrics & Gynecology

## 2016-04-18 ENCOUNTER — Telehealth: Payer: Self-pay | Admitting: Obstetrics & Gynecology

## 2016-04-18 ENCOUNTER — Other Ambulatory Visit: Payer: BLUE CROSS/BLUE SHIELD | Admitting: Obstetrics & Gynecology

## 2016-04-18 ENCOUNTER — Other Ambulatory Visit: Payer: Self-pay | Admitting: *Deleted

## 2016-04-18 ENCOUNTER — Other Ambulatory Visit: Payer: Self-pay | Admitting: Obstetrics & Gynecology

## 2016-04-18 MED ORDER — ESCITALOPRAM OXALATE 20 MG PO TABS: ORAL_TABLET | ORAL | 11 refills | Status: DC

## 2016-04-18 MED ORDER — ESCITALOPRAM OXALATE 20 MG PO TABS: 10.0000 mg | ORAL_TABLET | Freq: Every day | ORAL | 11 refills | Status: DC

## 2016-04-18 NOTE — Telephone Encounter (Signed)
Pt needs a refill and is requesting dosage be increased to 20mg  PO daily instead of 10mg . Please advise.

## 2016-05-06 ENCOUNTER — Ambulatory Visit (INDEPENDENT_AMBULATORY_CARE_PROVIDER_SITE_OTHER): Payer: BLUE CROSS/BLUE SHIELD | Admitting: Obstetrics & Gynecology

## 2016-05-06 ENCOUNTER — Other Ambulatory Visit (HOSPITAL_COMMUNITY)
Admission: RE | Admit: 2016-05-06 | Discharge: 2016-05-06 | Disposition: A | Discharge status: Home / Self Care | Payer: BLUE CROSS/BLUE SHIELD | Source: Ambulatory Visit | Attending: Obstetrics & Gynecology | Admitting: Obstetrics & Gynecology

## 2016-05-06 ENCOUNTER — Encounter: Payer: Self-pay | Admitting: Obstetrics & Gynecology

## 2016-05-06 VITALS — BP 130/80 | HR 76 | Wt 129.0 lb

## 2016-05-06 DIAGNOSIS — Z3009 Encounter for other general counseling and advice on contraception: Secondary | ICD-10-CM | POA: Diagnosis not present

## 2016-05-06 DIAGNOSIS — R87615 Unsatisfactory cytologic smear of cervix: Secondary | ICD-10-CM

## 2016-05-06 DIAGNOSIS — Z1151 Encounter for screening for human papillomavirus (HPV): Secondary | ICD-10-CM | POA: Insufficient documentation

## 2016-05-06 DIAGNOSIS — Z01419 Encounter for gynecological examination (general) (routine) without abnormal findings: Secondary | ICD-10-CM | POA: Diagnosis present

## 2016-05-06 MED ORDER — ETONOGESTREL-ETHINYL ESTRADIOL 0.12-0.015 MG/24HR VA RING
VAGINAL_RING | VAGINAL | 12 refills | Status: DC
Start: 2016-05-06 — End: 2016-08-20

## 2016-05-06 NOTE — Addendum Note (Signed)
Addended by: Diona Fanti A on: 05/06/2016 12:30 PM   Modules accepted: Orders

## 2016-05-06 NOTE — Progress Notes (Signed)
Chief Complaint  Patient presents with  . repeat pap    Blood pressure 130/80, pulse 76, weight 129 lb (58.5 kg), last menstrual period 04/27/2016, currently breastfeeding.  30 y.o. [id number] Patient's last menstrual period was 04/27/2016. The current method of family planning is none.  Outpatient Encounter Prescriptions as of 05/06/2016  Medication Sig  . escitalopram (LEXAPRO) 20 MG tablet Take 1 tablet daily  . [DISCONTINUED] desogestrel-ethinyl estradiol (APRI,EMOQUETTE,SOLIA) 0.15-30 MG-MCG tablet Take 1 tablet by mouth daily.  . Albuterol Sulfate (VENTOLIN HFA IN) Inhale into the lungs as needed.  . etonogestrel-ethinyl estradiol (NUVARING) 0.12-0.015 MG/24HR vaginal ring Insert vaginally and leave in place for 3 consecutive weeks, then remove for 1 week.  . VENTOLIN HFA 108 (90 Base) MCG/ACT inhaler INHALE 2 PUFFS EVERY 4 HOURS AS NEEDED FOR WHEEZING. (Patient not taking: Reported on 05/06/2016)  . [DISCONTINUED] amoxicillin (AMOXIL) 500 MG tablet Take 1 tablet (500 mg total) by mouth 3 (three) times daily. (Patient not taking: Reported on 04/05/2016)  . [DISCONTINUED] cefdinir (OMNICEF) 300 MG capsule Take 1 tablet by mouth twice a day for 7 days (Patient not taking: Reported on 04/05/2016)  . [DISCONTINUED] medroxyPROGESTERone (DEPO-PROVERA) 150 MG/ML injection Inject 1 mL (150 mg total) into the muscle every 3 (three) months.   No facility-administered encounter medications on file as of 05/06/2016.     Subjective Pt here for recheck Pap smear Also changes BCM  Objective   Pertinent ROS No burning with urination, frequency or urgency No nausea, vomiting or diarrhea Nor fever chills or other constitutional symptoms   Labs or studies     Impression Diagnoses this Encounter::   ICD-9-CM ICD-10-CM   1. Unsatisfactory cervical Papanicolaou smear 795.08 R87.615     Established relevant diagnosis(es):   Plan/Recommendations: Meds ordered this encounter    Medications  . etonogestrel-ethinyl estradiol (NUVARING) 0.12-0.015 MG/24HR vaginal ring    Sig: Insert vaginally and leave in place for 3 consecutive weeks, then remove for 1 week.    Dispense:  1 each    Refill:  12    Labs or Scans Ordered: No orders of the defined types were placed in this encounter.   Management:: nuva ring  Follow up No Follow-up on file.        Face to face time:  15 minutes  Greater than 50% of the visit time was spent in counseling and coordination of care with the patient.  The summary and outline of the counseling and care coordination is summarized in the note above.   All questions were answered.  Past Medical History:  Diagnosis Date  . Allergy   . Anxiety   . Asthma   . Depression   . Gastritis   . GERD (gastroesophageal reflux disease)   . 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 IBS (irritable bowel syndrome)   . Migraine headache   . Migraines   . Miscarriage 09/09/2013  . Pregnant 12/28/2014  . Supervision of normal pregnancy in first trimester 03/04/2014  . Supervision of other normal pregnancy 02/02/2015  . Thyroid disease    hypothyroid  . Vaginal discharge 01/31/2015    History reviewed. No pertinent surgical history.  OB History    Gravida Para Term Preterm AB Living   4 2 2   2 2    SAB TAB Ectopic Multiple Live Births   2     0 2      Allergies  Allergen Reactions  . Ibuprofen Hives    Social  History   Social History  . Marital status: Married    Spouse name: N/A  . Number of children: N/A  . Years of education: N/A   Social History Main Topics  . Smoking status: Never Smoker  . Smokeless tobacco: Never Used  . Alcohol use No  . Drug use: No  . Sexual activity: Not Currently    Birth control/ protection: None, Injection   Other Topics Concern  . None   Social History Narrative  . None    Family History  Problem Relation Age of Onset  . Diabetes Maternal Grandmother   . Hypertension Maternal  Grandmother   . Cancer Maternal Grandfather     bladder and liver  . Crohn's disease Maternal Grandfather

## 2016-05-07 ENCOUNTER — Ambulatory Visit (INDEPENDENT_AMBULATORY_CARE_PROVIDER_SITE_OTHER): Payer: BLUE CROSS/BLUE SHIELD | Admitting: *Deleted

## 2016-05-07 DIAGNOSIS — Z23 Encounter for immunization: Secondary | ICD-10-CM

## 2016-05-09 LAB — CYTOLOGY - PAP
DIAGNOSIS: NEGATIVE
HPV (WINDOPATH): NOT DETECTED

## 2016-05-27 ENCOUNTER — Encounter: Payer: Self-pay | Admitting: Obstetrics & Gynecology

## 2016-06-04 ENCOUNTER — Telehealth: Payer: Self-pay | Admitting: Family Medicine

## 2016-06-04 ENCOUNTER — Encounter: Payer: Self-pay | Admitting: Family Medicine

## 2016-06-04 NOTE — Telephone Encounter (Signed)
ok 

## 2016-06-04 NOTE — Telephone Encounter (Signed)
Done, notified patient

## 2016-06-04 NOTE — Telephone Encounter (Signed)
Patient requesting work excuse for 05/31/16 through 06/04/16 due to being out of work for a stomach virus.

## 2016-08-20 ENCOUNTER — Telehealth: Payer: Self-pay | Admitting: Adult Health

## 2016-08-20 MED ORDER — NORETHIN ACE-ETH ESTRAD-FE 1-20 MG-MCG(24) PO CAPS: 1.0000 | ORAL_CAPSULE | Freq: Every day | ORAL | 0 refills | Status: DC

## 2016-08-20 NOTE — Telephone Encounter (Signed)
Does not like nuva ring, will try taytulla

## 2016-08-26 MED FILL — ESCITALOPRAM 20 MG TABLET: 20 | 30 days supply | Qty: 30 | Fill #0

## 2016-09-30 MED FILL — ESCITALOPRAM 20 MG TABLET: 20 | 30 days supply | Qty: 30 | Fill #1

## 2016-11-18 MED FILL — ESCITALOPRAM 20 MG TABLET: 20 | 30 days supply | Qty: 30 | Fill #2

## 2016-12-24 MED FILL — ESCITALOPRAM 20 MG TABLET: 20 | 30 days supply | Qty: 30 | Fill #3

## 2017-02-04 MED FILL — ESCITALOPRAM 20 MG TABLET: 20 | 30 days supply | Qty: 30 | Fill #4

## 2017-03-24 MED FILL — ESCITALOPRAM 20 MG TABLET: 20 | 30 days supply | Qty: 30 | Fill #5

## 2017-04-04 DIAGNOSIS — H5213 Myopia, bilateral: Secondary | ICD-10-CM | POA: Diagnosis not present

## 2017-04-04 DIAGNOSIS — H52223 Regular astigmatism, bilateral: Secondary | ICD-10-CM | POA: Diagnosis not present

## 2017-04-30 ENCOUNTER — Encounter: Payer: Self-pay | Admitting: Adult Health

## 2017-04-30 ENCOUNTER — Ambulatory Visit: Payer: 59 | Admitting: Adult Health

## 2017-04-30 ENCOUNTER — Other Ambulatory Visit: Payer: Self-pay

## 2017-04-30 VITALS — BP 112/74 | HR 72 | Ht 64.0 in | Wt 144.0 lb

## 2017-04-30 DIAGNOSIS — R05 Cough: Secondary | ICD-10-CM | POA: Diagnosis not present

## 2017-04-30 DIAGNOSIS — R059 Cough, unspecified: Secondary | ICD-10-CM

## 2017-04-30 DIAGNOSIS — H65111 Acute and subacute allergic otitis media (mucoid) (sanguinous) (serous), right ear: Secondary | ICD-10-CM

## 2017-04-30 MED ORDER — AZITHROMYCIN 250 MG PO TABS: ORAL_TABLET | ORAL | 0 refills | Status: DC

## 2017-04-30 MED ORDER — BENZONATATE 200 MG PO CAPS: 200.0000 mg | ORAL_CAPSULE | Freq: Three times a day (TID) | ORAL | 1 refills | Status: DC | PRN

## 2017-04-30 MED ORDER — ALBUTEROL SULFATE HFA 108 (90 BASE) MCG/ACT IN AERS: INHALATION_SPRAY | RESPIRATORY_TRACT | 2 refills | Status: DC

## 2017-04-30 NOTE — Progress Notes (Signed)
Subjective:     Patient ID: Brianna Griffin, female   DOB: [birth year], 31 y.o.   MRN: [id number]  HPI [name] is a 31 year old white female, married, in complaining of cough for 2-3 weeks.   Review of Systems +cough for 2-3 weeks, is productive of clear to white mucous Reviewed past medical,surgical, social and family history. Reviewed medications and allergies.     Objective:   Physical Exam BP 112/74 (BP Location: Right Arm, Patient Position: Sitting, Cuff Size: Normal)   Pulse 72   Ht 5\' 4"  (1.626 m)   Wt 144 lb (65.3 kg)   LMP 04/11/2017   Breastfeeding? Yes   BMI 24.72 kg/m    Skin warm and dry.Right ear is red, left ear is clear with pearly gray TM, throat has no swelling or redness. Neck: mid line trachea, normal thyroid, good ROM, no lymphadenopathy noted. Lungs: clear to ausculation on left, has some mucidal wheezing that cleared with several coughs. Cardiovascular: regular rate and rhythm.  Assessment:     1. Cough   2. Subacute allergic otitis media of right ear, recurrence not specified       Plan:    Push fluids  Meds ordered this encounter  Medications  . azithromycin (ZITHROMAX) 250 MG tablet    Sig: Take 2 today then 1 daily for 4 days    Dispense:  6 tablet    Refill:  0    Order Specific Question:   Supervising Provider    Answer:   Elonda Husky, LUTHER H [[id number]]  . DISCONTD: benzonatate (TESSALON) 200 MG capsule    Sig: Take 1 capsule (200 mg total) by mouth 3 (three) times daily as needed for cough.    Dispense:  20 capsule    Refill:  1    Order Specific Question:   Supervising Provider    Answer:   EURE, LUTHER H [[id number]]  . albuterol (VENTOLIN HFA) 108 (90 Base) MCG/ACT inhaler    Sig: INHALE 2 PUFFS EVERY 4 HOURS AS NEEDED FOR WHEEZING.    Dispense:  18 g    Refill:  2    Order Specific Question:   Supervising Provider    Answer:   Tania Ade H [[id number]]  Use delsym as directed  F/U prn

## 2017-05-07 ENCOUNTER — Ambulatory Visit (INDEPENDENT_AMBULATORY_CARE_PROVIDER_SITE_OTHER): Payer: 59 | Admitting: Adult Health

## 2017-05-07 ENCOUNTER — Encounter: Payer: Self-pay | Admitting: Adult Health

## 2017-05-07 VITALS — BP 124/62 | HR 78 | Resp 16 | Ht 64.0 in | Wt 144.0 lb

## 2017-05-07 DIAGNOSIS — Z01419 Encounter for gynecological examination (general) (routine) without abnormal findings: Secondary | ICD-10-CM | POA: Diagnosis not present

## 2017-05-07 DIAGNOSIS — F418 Other specified anxiety disorders: Secondary | ICD-10-CM

## 2017-05-07 DIAGNOSIS — Z3041 Encounter for surveillance of contraceptive pills: Secondary | ICD-10-CM

## 2017-05-07 MED ORDER — ESCITALOPRAM OXALATE 20 MG PO TABS: ORAL_TABLET | ORAL | 4 refills | Status: DC

## 2017-05-07 MED FILL — ESCITALOPRAM 20 MG TABLET: 20 | 90 days supply | Qty: 90 | Fill #0

## 2017-05-07 NOTE — Progress Notes (Signed)
Patient ID: Brianna Griffin, female   DOB: 06-23-1985, 31 y.o.   MRN: [id number] History of Present Illness: Brianna Griffin is a 31 year old white female, married in for a well woman gyn exam, she had normal pap 05/06/16. PCP is Brianna Griffin.   Current Medications, Allergies, Past Medical History, Past Surgical History, Family History and Social History were reviewed in [employer] record.     Review of Systems: Patient denies any headaches, hearing loss, fatigue, blurred vision, shortness of breath, chest pain, abdominal pain, problems with bowel movements, urination, or intercourse. No joint pain or mood swings.Still with slight cough     Physical Exam:BP 124/62 (BP Location: Left Arm, Patient Position: Sitting, Cuff Size: Normal)   Pulse 78   Resp 16   Ht 5\' 4"  (1.626 m)   Wt 144 lb (65.3 kg)   LMP 04/11/2017 (Exact Date)   Breastfeeding? No   BMI 24.72 kg/m  General:  Well developed, well nourished, no acute distress Skin:  Warm and dry Neck:  Midline trachea, normal thyroid, good ROM, no lymphadenopathy Lungs; Clear to auscultation bilaterally Breast:  No dominant palpable mass, retraction, or nipple discharge Cardiovascular: Regular rate and rhythm Abdomen:  Soft, non tender, no hepatosplenomegaly Pelvic:  External genitalia is normal in appearance, no lesions.  The vagina is normal in appearance. Urethra has no lesions or masses. The cervix is bulbous.  Uterus is felt to be normal size, shape, and contour.  No adnexal masses or tenderness noted.Bladder is non tender, no masses felt. Extremities/musculoskeletal:  No swelling or varicosities noted, no clubbing or cyanosis Psych:  No mood changes, alert and cooperative,seems happy PHQ 9 score 3, is on meds and doing well.  Impression:  1. Encounter for well woman exam with routine gynecological exam   2. Encounter for surveillance of contraceptive pills   3. Depression with anxiety      Plan:  Check  CBC,CMP,TSH and lipids,to get fasting in near future  Continue Taytulla Meds ordered this encounter  Medications  . escitalopram (LEXAPRO) 20 MG tablet    Sig: Take 1 tablet daily    Dispense:  90 tablet    Refill:  4    Order Specific Question:   Supervising Provider    Answer:   Brianna Griffin [[id number]]  Physical in 1 year Pap in 2020 Mammogram at 29

## 2017-05-08 DIAGNOSIS — Z01419 Encounter for gynecological examination (general) (routine) without abnormal findings: Secondary | ICD-10-CM | POA: Diagnosis not present

## 2017-05-09 LAB — LIPID PANEL
CHOL/HDL RATIO: 3.6 ratio (ref 0.0–4.4)
Cholesterol, Total: 155 mg/dL (ref 100–199)
HDL: 43 mg/dL (ref >39)
LDL CALC: 93 mg/dL (ref 0–99)
Triglycerides: 96 mg/dL (ref 0–149)
VLDL Cholesterol Cal: 19 mg/dL (ref 5–40)

## 2017-05-09 LAB — COMPREHENSIVE METABOLIC PANEL
A/G RATIO: 1.6 (ref 1.2–2.2)
ALT: 13 IU/L (ref 0–32)
AST: 11 IU/L (ref 0–40)
Albumin: 4.2 g/dL (ref 3.5–5.5)
Alkaline Phosphatase: 65 IU/L (ref 39–117)
BUN / CREAT RATIO: 12 (ref 9–23)
BUN: 11 mg/dL (ref 6–20)
Bilirubin Total: 0.7 mg/dL (ref 0.0–1.2)
CALCIUM: 9.1 mg/dL (ref 8.7–10.2)
CO2: 21 mmol/L (ref 20–29)
Chloride: 103 mmol/L (ref 96–106)
Creatinine, Ser: 0.91 mg/dL (ref 0.57–1.00)
GFR, EST AFRICAN AMERICAN: 97 mL/min/{1.73_m2} (ref >59)
GFR, EST NON AFRICAN AMERICAN: 84 mL/min/{1.73_m2} (ref >59)
GLOBULIN, TOTAL: 2.7 g/dL (ref 1.5–4.5)
Glucose: 89 mg/dL (ref 65–99)
POTASSIUM: 4.1 mmol/L (ref 3.5–5.2)
SODIUM: 138 mmol/L (ref 134–144)
TOTAL PROTEIN: 6.9 g/dL (ref 6.0–8.5)

## 2017-05-09 LAB — CBC
HEMATOCRIT: 39.6 % (ref 34.0–46.6)
Hemoglobin: 13.5 g/dL (ref 11.1–15.9)
MCH: 30.1 pg (ref 26.6–33.0)
MCHC: 34.1 g/dL (ref 31.5–35.7)
MCV: 88 fL (ref 79–97)
Platelets: 208 10*3/uL (ref 150–379)
RBC: 4.48 x10E6/uL (ref 3.77–5.28)
RDW: 12.7 % (ref 12.3–15.4)
WBC: 4.2 10*3/uL (ref 3.4–10.8)

## 2017-05-09 LAB — TSH: TSH: 1.83 u[IU]/mL (ref 0.450–4.500)

## 2017-09-15 ENCOUNTER — Encounter: Payer: Self-pay | Admitting: Family Medicine

## 2017-09-15 ENCOUNTER — Ambulatory Visit (INDEPENDENT_AMBULATORY_CARE_PROVIDER_SITE_OTHER): Payer: No Typology Code available for payment source | Admitting: Family Medicine

## 2017-09-15 VITALS — BP 138/80 | Temp 97.7°F | Ht 64.0 in | Wt 144.0 lb

## 2017-09-15 DIAGNOSIS — H1011 Acute atopic conjunctivitis, right eye: Secondary | ICD-10-CM

## 2017-09-15 DIAGNOSIS — J301 Allergic rhinitis due to pollen: Secondary | ICD-10-CM

## 2017-09-15 MED ORDER — FLUTICASONE PROPIONATE 50 MCG/ACT NA SUSP: 2.0000 | Freq: Every day | NASAL | 5 refills | Status: DC

## 2017-09-15 MED ORDER — OLOPATADINE HCL 0.1 % OP SOLN: 1.0000 [drp] | Freq: Two times a day (BID) | OPHTHALMIC | 12 refills | Status: DC

## 2017-09-15 NOTE — Progress Notes (Signed)
   Subjective:    Patient ID: Brianna Griffin, female    DOB: 21-Jun-1985, 32 y.o.   MRN: [id number]  HPI Patient is here today with complaint of what she thinks is pinkeye in right eye.She needs dx before she can return to work.She also complaints of a cough,runny nose,bilateral ear pain more so on the left. Headache,sorethroat on going for two days she is on zyrtec and eyes.  Mild sinus symptoms recently had congestion drainage sneezing also crusting in the eye on the right side.  No wheezing difficulty breathing intermittent sinus pressure intermittent ear pain Review of Systems  Constitutional: Negative for activity change and fever.  HENT: Positive for congestion and rhinorrhea. Negative for ear pain.   Eyes: Positive for discharge and itching.  Respiratory: Positive for cough. Negative for shortness of breath and wheezing.   Cardiovascular: Negative for chest pain.       Objective:   Physical Exam  Constitutional: She appears well-developed.  HENT:  Head: Normocephalic.  Nose: Nose normal.  Mouth/Throat: Oropharynx is clear and moist. No oropharyngeal exudate.  Eyes: Right eye exhibits discharge.  Neck: Neck supple.  Cardiovascular: Normal rate and normal heart sounds.  No murmur heard. Pulmonary/Chest: Effort normal and breath sounds normal. She has no wheezes.  Lymphadenopathy:    She has no cervical adenopathy.  Skin: Skin is warm and dry.  Nursing note and vitals reviewed.  Sinus nontender to percussion       Assessment & Plan:  Has mild allergic conjunctivitis as well as viral conjunctivitis I do not recommend antibiotic eyedrops I do recommend allergy eyedrops as well as Flonase and loratadine If progressive troubles or worse follow-up if needing antibiotic or other problems occur please call

## 2017-09-29 ENCOUNTER — Other Ambulatory Visit: Payer: No Typology Code available for payment source

## 2017-09-29 DIAGNOSIS — R350 Frequency of micturition: Secondary | ICD-10-CM

## 2017-09-30 ENCOUNTER — Other Ambulatory Visit: Payer: Self-pay | Admitting: Adult Health

## 2017-09-30 LAB — URINALYSIS, ROUTINE W REFLEX MICROSCOPIC
Bilirubin, UA: NEGATIVE
Glucose, UA: NEGATIVE
Ketones, UA: NEGATIVE
Nitrite, UA: NEGATIVE
PH UA: 5.5 (ref 5.0–7.5)
Specific Gravity, UA: 1.027 (ref 1.005–1.030)
Urobilinogen, Ur: 0.2 mg/dL (ref 0.2–1.0)

## 2017-09-30 LAB — MICROSCOPIC EXAMINATION: CASTS: NONE SEEN /LPF

## 2017-09-30 MED ORDER — PHENAZOPYRIDINE HCL 200 MG PO TABS: 200.0000 mg | ORAL_TABLET | Freq: Three times a day (TID) | ORAL | 0 refills | Status: DC | PRN

## 2017-09-30 MED ORDER — SULFAMETHOXAZOLE-TRIMETHOPRIM 800-160 MG PO TABS: 1.0000 | ORAL_TABLET | Freq: Two times a day (BID) | ORAL | 0 refills | Status: DC

## 2017-09-30 NOTE — Progress Notes (Signed)
Has UF, culture pending,will rx septra ds and pyridium and push water

## 2017-10-03 LAB — URINE CULTURE

## 2017-10-08 MED FILL — ESCITALOPRAM 20 MG TABLET: 20 | 90 days supply | Qty: 90 | Fill #1

## 2017-11-11 MED FILL — AZITHROMYCIN 250 MG TABLET: 250 | 5 days supply | Qty: 6 | Fill #0

## 2017-12-12 ENCOUNTER — Ambulatory Visit (INDEPENDENT_AMBULATORY_CARE_PROVIDER_SITE_OTHER): Payer: No Typology Code available for payment source | Admitting: Women's Health

## 2017-12-12 ENCOUNTER — Encounter: Payer: Self-pay | Admitting: Women's Health

## 2017-12-12 VITALS — BP 132/82 | HR 90 | Ht 64.0 in | Wt 149.5 lb

## 2017-12-12 DIAGNOSIS — L292 Pruritus vulvae: Secondary | ICD-10-CM

## 2017-12-12 LAB — POCT WET PREP (WET MOUNT)
CLUE CELLS WET PREP WHIFF POC: NEGATIVE
TRICHOMONAS WET PREP HPF POC: ABSENT

## 2017-12-12 MED ORDER — NYSTATIN-TRIAMCINOLONE 100000-0.1 UNIT/GM-% EX OINT: 1.0000 "application " | TOPICAL_OINTMENT | Freq: Two times a day (BID) | CUTANEOUS | 0 refills | Status: DC

## 2017-12-12 NOTE — Progress Notes (Signed)
   GYN VISIT Patient name: Brianna Griffin MRN [id number]  Date of birth: [birth year] Chief Complaint:   Vaginal Itching  History of Present Illness:   Brianna Griffin is a 32 y.o. [phone] Caucasian female being seen today for vulvar itching x 1.5wks.  Tried otc monistat x 1, then another cream which seemed to help some, but then itching started back today. Denies odor.      Patient's last menstrual period was 11/21/2017. The current method of family planning is OCP (estrogen/progesterone). Last pap 05/06/16. Results were:  neg Review of Systems:   Pertinent items are noted in HPI Denies fever/chills, dizziness, headaches, visual disturbances, fatigue, shortness of breath, chest pain, abdominal pain, vomiting, abnormal vaginal discharge/itching/odor/irritation, problems with periods, bowel movements, urination, or intercourse unless otherwise stated above.  Pertinent History Reviewed:  Reviewed past medical,surgical, social, obstetrical and family history.  Reviewed problem list, medications and allergies. Physical Assessment:   Vitals:   12/12/17 1222  BP: 132/82  Pulse: 90  Weight: 149 lb 8 oz (67.8 kg)  Height: 5\' 4"  (1.626 m)  Body mass index is 25.66 kg/m.       Physical Examination:   General appearance: alert, well appearing, and in no distress  Mental status: alert, oriented to person, place, and time  Skin: warm & dry   Cardiovascular: normal heart rate noted  Respiratory: normal respiratory effort, no distress  Abdomen: soft, non-tender   Pelvic: VULVA: normal appearing vulva with no masses, tenderness or lesions, VAGINA: normal appearing vagina with normal color and discharge, no lesions, CERVIX: normal appearing cervix without discharge or lesions  Extremities: no edema   Results for orders placed or performed in visit on 12/12/17 (from the past 24 hour(s))  POCT Wet Prep [facility])   Collection Time: 12/12/17  1:19 PM  Result Value Ref Range   Source Wet Prep  POC vaginal    WBC, Wet Prep HPF POC none    Bacteria Wet Prep HPF POC Few Few   BACTERIA WET PREP MORPHOLOGY POC     Clue Cells Wet Prep HPF POC None None   Clue Cells Wet Prep Whiff POC Negative Whiff    Yeast Wet Prep HPF POC None None   KOH Wet Prep POC  None   Trichomonas Wet Prep HPF POC Absent Absent    Assessment & Plan:  1) Vulvar itching> normal wet prep, rx mycolog, let us know if not improving  Meds:  Meds ordered this encounter  Medications  . nystatin-triamcinolone ointment (MYCOLOG)    Sig: Apply 1 application topically 2 (two) times daily.    Dispense:  30 g    Refill:  0    Order Specific Question:   Supervising Provider    Answer:   Tania Ade H [[id number]]    Orders Placed This Encounter  Procedures  . POCT Wet Prep [facility])    No follow-ups on file.  [place], WHNP-BC 12/12/2017 1:20 PM

## 2018-01-13 ENCOUNTER — Encounter: Payer: Self-pay | Admitting: Adult Health

## 2018-01-13 ENCOUNTER — Ambulatory Visit (INDEPENDENT_AMBULATORY_CARE_PROVIDER_SITE_OTHER): Payer: No Typology Code available for payment source | Admitting: Adult Health

## 2018-01-13 VITALS — BP 129/84 | HR 68 | Temp 98.2°F | Ht 64.0 in | Wt 150.5 lb

## 2018-01-13 DIAGNOSIS — H65111 Acute and subacute allergic otitis media (mucoid) (sanguinous) (serous), right ear: Secondary | ICD-10-CM | POA: Diagnosis not present

## 2018-01-13 DIAGNOSIS — J029 Acute pharyngitis, unspecified: Secondary | ICD-10-CM | POA: Diagnosis not present

## 2018-01-13 MED ORDER — LORATADINE-PSEUDOEPHEDRINE ER 10-240 MG PO TB24: 1.0000 | ORAL_TABLET | Freq: Every day | ORAL | 0 refills | Status: DC

## 2018-01-13 MED ORDER — AZITHROMYCIN 250 MG PO TABS
ORAL_TABLET | ORAL | 0 refills | Status: DC
Start: 2018-01-13 — End: 2018-06-24

## 2018-01-13 MED FILL — ESCITALOPRAM 20 MG TABLET: 20 | 90 days supply | Qty: 90 | Fill #2

## 2018-01-13 NOTE — Progress Notes (Signed)
  Subjective:     Patient ID: Coral Spikes, female   DOB: 22-Nov-1985, 32 y.o.   MRN: [id number]  HPI Wafaa is a 32 year old white female, married, G4P2, worked in for complaints of sore throat, congestion and left ear ache.   Review of Systems +sore throat +left ear ache, feels congested  Reviewed past medical,surgical, social and family history. Reviewed medications and allergies.     Objective:   Physical Exam BP 129/84 (BP Location: Left Arm, Patient Position: Sitting, Cuff Size: Normal)   Pulse 68   Temp 98.2 F (36.8 C)   Ht 5\' 4"  (1.626 m)   Wt 150 lb 8 oz (68.3 kg)   LMP 12/19/2017   BMI 25.83 kg/m   Skin warm and dry. Neck: mid line trachea, normal thyroid, good ROM, no lymphadenopathy noted. Lungs: clear to ausculation bilaterally. Cardiovascular: regular rate and rhythm.Throat is red, no pustules and right ear canal is red,with some swelling, and left ear canal is red, but has light reflex off TM.No facial tenderness on palpation. Will switch from zyrtec to Claritin D and will give Z pack.    Assessment:     1. Subacute allergic otitis media of right ear, recurrence not specified   2. Sore throat       Plan:     Meds ordered this encounter  Medications  . azithromycin (ZITHROMAX) 250 MG tablet    Sig: Take 2 today and then 1 daily for 4 days    Dispense:  6 tablet    Refill:  0    Order Specific Question:   Supervising Provider    Answer:   Elonda Husky, LUTHER H [[id number]]  . loratadine-pseudoephedrine (CLARITIN-D 24 HOUR) 10-240 MG 24 hr tablet    Sig: Take 1 tablet by mouth daily.    Dispense:  30 tablet    Refill:  0    Order Specific Question:   Supervising Provider    Answer:   Tania Ade H [[id number]]  F/U prn

## 2018-05-04 MED FILL — ESCITALOPRAM 20 MG TABLET: 20 | 90 days supply | Qty: 90 | Fill #0

## 2018-05-21 ENCOUNTER — Other Ambulatory Visit: Payer: No Typology Code available for payment source | Admitting: Adult Health

## 2018-05-25 ENCOUNTER — Other Ambulatory Visit: Payer: No Typology Code available for payment source | Admitting: Adult Health

## 2018-06-18 ENCOUNTER — Ambulatory Visit (INDEPENDENT_AMBULATORY_CARE_PROVIDER_SITE_OTHER): Payer: No Typology Code available for payment source | Admitting: Family Medicine

## 2018-06-18 ENCOUNTER — Encounter: Payer: Self-pay | Admitting: Family Medicine

## 2018-06-18 VITALS — BP 126/80 | Temp 101.1°F | Ht 64.0 in | Wt 149.0 lb

## 2018-06-18 DIAGNOSIS — J029 Acute pharyngitis, unspecified: Secondary | ICD-10-CM

## 2018-06-18 DIAGNOSIS — J111 Influenza due to unidentified influenza virus with other respiratory manifestations: Secondary | ICD-10-CM | POA: Diagnosis not present

## 2018-06-18 LAB — POCT RAPID STREP A (OFFICE): Rapid Strep A Screen: NEGATIVE

## 2018-06-18 MED ORDER — ALBUTEROL SULFATE HFA 108 (90 BASE) MCG/ACT IN AERS: INHALATION_SPRAY | RESPIRATORY_TRACT | 2 refills | Status: DC

## 2018-06-18 MED ORDER — OSELTAMIVIR PHOSPHATE 75 MG PO CAPS: 75.0000 mg | ORAL_CAPSULE | Freq: Two times a day (BID) | ORAL | 0 refills | Status: DC

## 2018-06-18 NOTE — Patient Instructions (Signed)

## 2018-06-18 NOTE — Progress Notes (Signed)
   Subjective:    Patient ID: Brianna Griffin, female    DOB: [birth year], 33 y.o.   MRN: [id number]  HPI Patient is here today with complaints of a headache,sore throat,cough,wheezing,fever,body aches, all began yesterday with when she woke with a cough and then it progressed through out the day. Relates onset of headache sore throat cough some wheezing but no severe shortness of breath denies nausea vomiting diarrhea does relate some fever body aches She has been taking Tylenol.  Results for orders placed or performed in visit on 06/18/18  POCT rapid strep A  Result Value Ref Range   Rapid Strep A Screen Negative Negative    Review of Systems  Constitutional: Negative for activity change and fever.  HENT: Positive for congestion and rhinorrhea. Negative for ear pain.   Eyes: Negative for discharge.  Respiratory: Positive for cough. Negative for shortness of breath and wheezing.   Cardiovascular: Negative for chest pain.       Objective:   Physical Exam Vitals signs and nursing note reviewed.  Constitutional:      Appearance: She is well-developed.  HENT:     Head: Normocephalic.     Nose: Nose normal.     Mouth/Throat:     Pharynx: No oropharyngeal exudate.  Neck:     Musculoskeletal: Neck supple.  Cardiovascular:     Rate and Rhythm: Normal rate.     Heart sounds: Normal heart sounds. No murmur.  Pulmonary:     Effort: Pulmonary effort is normal.     Breath sounds: Normal breath sounds. No wheezing.  Lymphadenopathy:     Cervical: No cervical adenopathy.  Skin:    General: Skin is warm and dry.     Patient not toxic no respiratory distress      Assessment & Plan:  Influenza-the patient was diagnosed with influenza. Patient/family educated about the flu and warning signs to watch for. If difficulty breathing,  cyanosis, disorientation, or progressive worsening then immediately get rechecked at the ER. If progressive symptoms be certain to be rechecked.  Supportive measures such as Tylenol/ibuprofen was discussed. No aspirin use in children. Warning signs discussed in detail Tamiflu prescribed

## 2018-06-19 LAB — SPECIMEN STATUS REPORT

## 2018-06-19 LAB — STREP A DNA PROBE: STREP GP A DIRECT, DNA PROBE: NEGATIVE

## 2018-06-24 ENCOUNTER — Ambulatory Visit (INDEPENDENT_AMBULATORY_CARE_PROVIDER_SITE_OTHER): Payer: No Typology Code available for payment source | Admitting: Adult Health

## 2018-06-24 ENCOUNTER — Other Ambulatory Visit (HOSPITAL_COMMUNITY)
Admission: RE | Admit: 2018-06-24 | Discharge: 2018-06-24 | Disposition: A | Discharge status: Home / Self Care | Payer: No Typology Code available for payment source | Source: Ambulatory Visit | Attending: Adult Health | Admitting: Adult Health

## 2018-06-24 ENCOUNTER — Encounter: Payer: Self-pay | Admitting: Adult Health

## 2018-06-24 VITALS — BP 124/84 | HR 86 | Ht 64.0 in | Wt 149.0 lb

## 2018-06-24 DIAGNOSIS — Z01419 Encounter for gynecological examination (general) (routine) without abnormal findings: Secondary | ICD-10-CM

## 2018-06-24 DIAGNOSIS — F418 Other specified anxiety disorders: Secondary | ICD-10-CM

## 2018-06-24 DIAGNOSIS — Z3041 Encounter for surveillance of contraceptive pills: Secondary | ICD-10-CM

## 2018-06-24 MED ORDER — ESCITALOPRAM OXALATE 20 MG PO TABS: ORAL_TABLET | ORAL | 4 refills | Status: DC

## 2018-06-24 NOTE — Progress Notes (Signed)
Patient ID: Brianna Griffin, female   DOB: 01/04/1986, 33 y.o.   MRN: [id number] History of Present Illness: Brianna Griffin is a 33 year old white female, married, G4P2 in for a well woman gyn exam and pap. PCP is Brianna Griffin.   Current Medications, Allergies, Past Medical History, Past Surgical History, Family History and Social History were reviewed in [employer] record.     Review of Systems: Patient denies any headaches, hearing loss, fatigue, blurred vision, shortness of breath, chest pain, abdominal pain, problems with bowel movements, urination, or intercourse. No joint pain, has some right breast pain or heaviness at times, and ?PMS(per Husband). She is happy with her OCs.   Physical Exam:BP 124/84 (BP Location: Left Arm, Patient Position: Sitting, Cuff Size: Normal)   Pulse 86   Ht 5\' 4"  (1.626 m)   Wt 149 lb (67.6 kg)   LMP 06/05/2018   BMI 25.58 kg/m  General:  Well developed, well nourished, no acute distress Skin:  Warm and dry Neck:  Midline trachea, normal thyroid, good ROM, no lymphadenopathy Lungs; Clear to auscultation bilaterally Breast:  No dominant palpable mass, retraction, or nipple discharge Cardiovascular: Regular rate and rhythm Abdomen:  Soft, non tender, no hepatosplenomegaly Pelvic:  External genitalia is normal in appearance, no lesions.  The vagina is normal in appearance. Urethra has no lesions or masses. The cervix is bulbous.Pap with HPV performed.  Uterus is felt to be normal size, shape, and contour.  No adnexal masses or tenderness noted.Bladder is non tender, no masses felt. Rectal: Good sphincter tone, no polyps, or hemorrhoids felt.  Hemoccult negative. Extremities/musculoskeletal:  No swelling or varicosities noted, no clubbing or cyanosis Psych:  No mood changes, alert and cooperative,seems happy Fall risk is low. PHQ 2 score 0. Examination chaperoned by Estill Bamberg Rash LPN.  Impression:  1. Encounter for gynecological  examination with Papanicolaou smear of cervix   2. Encounter for surveillance of contraceptive pills   3. Depression with anxiety      Plan: Meds ordered this encounter  Medications  . escitalopram (LEXAPRO) 20 MG tablet    Sig: Take 1 tablet daily    Dispense:  90 tablet    Refill:  4    Order Specific Question:   Supervising Provider    Answer:   Tania Ade H [[id number]]  Try not wearing under wire everyday, and decrease caffeine Increase dairy near period Continue Taytulla, 4 sample packs given Physical in 1 year Pap in 3 if normal

## 2018-06-26 LAB — CYTOLOGY - PAP
DIAGNOSIS: NEGATIVE
HPV (WINDOPATH): NOT DETECTED

## 2018-08-14 MED FILL — ESCITALOPRAM 20 MG TABLET: 20 | 90 days supply | Qty: 90 | Fill #0

## 2018-10-30 ENCOUNTER — Encounter: Payer: Self-pay | Admitting: *Deleted

## 2018-11-02 ENCOUNTER — Other Ambulatory Visit: Payer: Self-pay

## 2018-11-02 ENCOUNTER — Ambulatory Visit (INDEPENDENT_AMBULATORY_CARE_PROVIDER_SITE_OTHER): Payer: No Typology Code available for payment source | Admitting: Adult Health

## 2018-11-02 ENCOUNTER — Encounter: Payer: Self-pay | Admitting: Adult Health

## 2018-11-02 VITALS — BP 137/83 | HR 77 | Ht 64.0 in | Wt 154.0 lb

## 2018-11-02 DIAGNOSIS — F418 Other specified anxiety disorders: Secondary | ICD-10-CM

## 2018-11-02 DIAGNOSIS — G43009 Migraine without aura, not intractable, without status migrainosus: Secondary | ICD-10-CM | POA: Diagnosis not present

## 2018-11-02 MED ORDER — RIZATRIPTAN BENZOATE 10 MG PO TABS: 10.0000 mg | ORAL_TABLET | ORAL | 1 refills | Status: DC | PRN

## 2018-11-02 MED ORDER — HYDROXYZINE HCL 10 MG PO TABS: 10.0000 mg | ORAL_TABLET | Freq: Three times a day (TID) | ORAL | 1 refills | Status: DC | PRN

## 2018-11-02 NOTE — Progress Notes (Signed)
Patient ID: Brianna Griffin, female   DOB: [birth year], 33 y.o.   MRN: [id number] History of Present Illness: Brianna Griffin is a 33 year old white female, married, G4P2  in complaining of having migraines about once a month, which is an increase, and denies auras, and is having more anxity recently is still on lexapro, so no depression, but feels jittery and has has elevated heart rate and nausea and sweating at times.  PCP is Dr Mickie Hillier.  Current Medications, Allergies, Past Medical History, Past Surgical History, Family History and Social History were reviewed in [employer] record.     Review of Systems: Having migraines about once a month, which has increased, no aura  Having increased anxiety, maybe 3-4 x a week some weeks, feels jittery and has nausea at times and elevated heart rate and may sweat  She is on Taytulla and doing well.    Physical Exam:BP 137/83 (BP Location: Left Arm, Patient Position: Sitting, Cuff Size: Normal)   Pulse 77   Ht 5\' 4"  (1.626 m)   Wt 154 lb (69.9 kg)   LMP 10/23/2018   BMI 26.43 kg/m  General:  Well developed, well nourished, no acute distress Skin:  Warm and dry Lungs; Clear to auscultation bilaterally CV:RRR Psych:  No mood changes, alert and cooperative,seems happy Fall risk is low. PHQ 2 score 1.   Impression and Plan: 1. Depression with anxiety -will try vistaril for anxiety as needed, and if continues may add buspar Meds ordered this encounter  Medications  . rizatriptan (MAXALT) 10 MG tablet    Sig: Take 1 tablet (10 mg total) by mouth as needed for migraine. May repeat in 2 hours if needed    Dispense:  10 tablet    Refill:  1    Order Specific Question:   Supervising Provider    Answer:   Elonda Husky, LUTHER H [[id number]]  . hydrOXYzine (ATARAX/VISTARIL) 10 MG tablet    Sig: Take 1 tablet (10 mg total) by mouth every 8 (eight) hours as needed. Take 1 every 8 hours prn anxiety    Dispense:  30 tablet    Refill:  1   Order Specific Question:   Supervising Provider    Answer:   Tania Ade H [[id number]]  Follow up prn  2. Migraine without aura and without status migrainosus, not intractable -will Rx Maxalt as rescue medication

## 2018-11-17 MED FILL — ESCITALOPRAM 20 MG TABLET: 20 | 90 days supply | Qty: 90 | Fill #1

## 2019-01-18 ENCOUNTER — Other Ambulatory Visit: Payer: Self-pay

## 2019-01-18 ENCOUNTER — Ambulatory Visit: Payer: No Typology Code available for payment source | Admitting: Women's Health

## 2019-01-18 ENCOUNTER — Encounter: Payer: Self-pay | Admitting: Women's Health

## 2019-01-18 VITALS — BP 137/89 | HR 70 | Ht 64.0 in | Wt 154.0 lb

## 2019-01-18 DIAGNOSIS — Z8759 Personal history of other complications of pregnancy, childbirth and the puerperium: Secondary | ICD-10-CM | POA: Diagnosis not present

## 2019-01-18 DIAGNOSIS — Z3169 Encounter for other general counseling and advice on procreation: Secondary | ICD-10-CM

## 2019-01-18 DIAGNOSIS — Z8639 Personal history of other endocrine, nutritional and metabolic disease: Secondary | ICD-10-CM | POA: Diagnosis not present

## 2019-01-18 NOTE — Patient Instructions (Signed)
Call with + pregnancy test

## 2019-01-18 NOTE — Progress Notes (Signed)
   GYN VISIT Patient name: Brianna Griffin MRN [id number]  Date of birth: 13-Aug-1985 Chief Complaint:   discuss birth control  History of Present Illness:   Brianna Griffin is a 33 y.o. [phone] Caucasian female being seen today for contemplating trying for pregnancy, wants to discuss current meds. Currently on COC's, hasn't decided when she wants to come off. On maxalt for migraines, vistaril prn anxiety, lexapro daily. Has h/o 2 SABs, thyroid levels were 'off' then, wants to recheck to make sure they are ok now. Not taking pnv yet, but did buy them.     Patient's last menstrual period was 01/12/2019. The current method of family planning is OCP (estrogen/progesterone).  Last pap 06/24/18. Results were:  normal Review of Systems:   Pertinent items are noted in HPI Denies fever/chills, dizziness, headaches, visual disturbances, fatigue, shortness of breath, chest pain, abdominal pain, vomiting, abnormal vaginal discharge/itching/odor/irritation, problems with periods, bowel movements, urination, or intercourse unless otherwise stated above.  Pertinent History Reviewed:  Reviewed past medical,surgical, social, obstetrical and family history.  Reviewed problem list, medications and allergies. Physical Assessment:   Vitals:   01/18/19 0951  BP: 137/89  Pulse: 70  Weight: 154 lb (69.9 kg)  Height: 5\' 4"  (1.626 m)  Body mass index is 26.43 kg/m.       Physical Examination:   General appearance: alert, well appearing, and in no distress  Mental status: alert, oriented to person, place, and time  Skin: warm & dry   Cardiovascular: normal heart rate noted  Respiratory: normal respiratory effort, no distress  Abdomen: soft, non-tender   Pelvic: examination not indicated  Extremities: no edema   No results found for this or any previous visit (from the past 24 hour(s)).  Assessment & Plan:  1) Pre-conception counseling> ok to continue lexapro and prn vistaril, no maxalt. Start pnv at  least 78mth prior to coming off COCs.   2) H/O SAB x 2> Call us w/ +PT, will get progesterone level and pt feels better doing progesterone suppositories  2) H/O thyroid abnormality> will check TSH and free T4 today  Meds: No orders of the defined types were placed in this encounter.   Orders Placed This Encounter  Procedures  . TSH  . T4, free    Return for prn.  [name] CNM, [facility] 01/18/2019 10:45 AM

## 2019-01-19 LAB — T4, FREE: Free T4: 1.22 ng/dL (ref 0.82–1.77)

## 2019-01-19 LAB — TSH: TSH: 2.02 u[IU]/mL (ref 0.450–4.500)

## 2019-01-29 ENCOUNTER — Telehealth: Payer: No Typology Code available for payment source | Admitting: Physician Assistant

## 2019-01-29 DIAGNOSIS — L230 Allergic contact dermatitis due to metals: Secondary | ICD-10-CM | POA: Diagnosis not present

## 2019-01-29 MED ORDER — PREDNISONE 10 MG PO TABS: ORAL_TABLET | ORAL | 0 refills | Status: AC

## 2019-01-29 NOTE — Progress Notes (Signed)
I have spent 5 minutes in review of e-visit questionnaire, review and updating patient chart, medical decision making and response to patient.   Airiel Oblinger Cody Jaquari Reckner, PA-C    

## 2019-01-29 NOTE — Progress Notes (Signed)
E Visit for Rash  We are sorry that you are not feeling well. Here is how we plan to help!  Based on what you shared with me it looks like you have contact dermatitis.  Contact dermatitis is a skin rash caused by something that touches the skin and causes irritation or inflammation.  Your skin may be red, swollen, dry, cracked, and itch.  The rash should go away in a few days but can last a few weeks.  If you get a rash, it's important to figure out what caused it so the irritant can be avoided in the future. I have sent in a steroid taper for you to take as directed to calm symptoms down. Consider benadryl to help with itching and inflammation as well. If symptoms are worsening despite treatment or you note any shortness of breath you need to go to the ER.     HOME CARE:   Take cool showers and avoid direct sunlight.  Apply cool compress or wet dressings.  Take a bath in an oatmeal bath.  Sprinkle content of one Aveeno packet under running faucet with comfortably warm water.  Bathe for 15-20 minutes, 1-2 times daily.  Pat dry with a towel. Do not rub the rash.  Use hydrocortisone cream.  Take an antihistamine like Benadryl for widespread rashes that itch.  The adult dose of Benadryl is 25-50 mg by mouth 4 times daily.  Caution:  This type of medication may cause sleepiness.  Do not drink alcohol, drive, or operate dangerous machinery while taking antihistamines.  Do not take these medications if you have prostate enlargement.  Read package instructions thoroughly on all medications that you take.  GET HELP RIGHT AWAY IF:   Symptoms don't go away after treatment.  Severe itching that persists.  If you rash spreads or swells.  If you rash begins to smell.  If it blisters and opens or develops a yellow-brown crust.  You develop a fever.  You have a sore throat.  You become short of breath.  MAKE SURE YOU:  Understand these instructions. Will watch your condition. Will get  help right away if you are not doing well or get worse.  Thank you for choosing an e-visit. Your e-visit answers were reviewed by a board certified advanced clinical practitioner to complete your personal care plan. Depending upon the condition, your plan could have included both over the counter or prescription medications. Please review your pharmacy choice. Be sure that the pharmacy you have chosen is open so that you can pick up your prescription now.  If there is a problem you may message your provider in [place] to have the prescription routed to another pharmacy. Your safety is important to [place]. If you have drug allergies check your prescription carefully.  For the next 24 hours, you can use MyChart to ask questions about today's visit, request a non-urgent call back, or ask for a work or school excuse from your e-visit provider. You will get an email in the next two days asking about your experience. I hope that your e-visit has been valuable and will speed your recovery.

## 2019-02-12 ENCOUNTER — Telehealth: Payer: Self-pay | Admitting: *Deleted

## 2019-02-12 MED ORDER — NITROFURANTOIN MONOHYD MACRO 100 MG PO CAPS: 100.0000 mg | ORAL_CAPSULE | Freq: Two times a day (BID) | ORAL | 0 refills | Status: DC

## 2019-02-12 NOTE — Telephone Encounter (Signed)
Patient left message stating that she has been having uti symptoms since Monday. She has burning with urination. She would like to have abx sent in instread of a visit to avoid occurrence at work. She uses [facility] and is only allergic to ibuprofen.

## 2019-02-12 NOTE — Telephone Encounter (Signed)
In cases such as this , having pt bring a sample in would be helpful. It's helpful if you and I discuss this during office so I can authorize verbally,

## 2019-02-12 NOTE — Addendum Note (Signed)
Addended by: Jonnie Kind on: 02/12/2019 03:50 PM   Modules accepted: Orders

## 2019-02-26 ENCOUNTER — Telehealth: Payer: Self-pay | Admitting: Family Medicine

## 2019-02-26 DIAGNOSIS — L309 Dermatitis, unspecified: Secondary | ICD-10-CM

## 2019-02-26 DIAGNOSIS — Z1283 Encounter for screening for malignant neoplasm of skin: Secondary | ICD-10-CM

## 2019-02-26 NOTE — Telephone Encounter (Signed)
Needs referral to Dr. Rozann Lesches (derm) for eczema & skin cancer screening  (referral required due to pt's [employer] insurance)

## 2019-03-02 MED FILL — ESCITALOPRAM 20 MG TABLET: 20 | 90 days supply | Qty: 90 | Fill #2

## 2019-03-02 NOTE — Telephone Encounter (Signed)
Ok lets 

## 2019-03-02 NOTE — Telephone Encounter (Signed)
Referral placed and pt is aware. 

## 2019-05-26 ENCOUNTER — Telehealth: Payer: Self-pay | Admitting: Family Medicine

## 2019-05-26 ENCOUNTER — Ambulatory Visit (INDEPENDENT_AMBULATORY_CARE_PROVIDER_SITE_OTHER): Payer: No Typology Code available for payment source | Admitting: Family Medicine

## 2019-05-26 DIAGNOSIS — F321 Major depressive disorder, single episode, moderate: Secondary | ICD-10-CM

## 2019-05-26 DIAGNOSIS — F411 Generalized anxiety disorder: Secondary | ICD-10-CM

## 2019-05-26 MED ORDER — ESCITALOPRAM OXALATE 20 MG PO TABS: ORAL_TABLET | ORAL | 1 refills | Status: DC

## 2019-05-26 MED ORDER — BUPROPION HCL ER (XL) 300 MG PO TB24: ORAL_TABLET | ORAL | 1 refills | Status: DC

## 2019-05-26 MED FILL — BUPROPION HCL XL 150 MG TAB: 150 | 90 days supply | Qty: 180 | Fill #0

## 2019-05-26 MED FILL — ESCITALOPRAM 20 MG TABLET: 20 | 90 days supply | Qty: 90 | Fill #0

## 2019-05-26 NOTE — Telephone Encounter (Signed)
Yes lets do it that way

## 2019-05-26 NOTE — Telephone Encounter (Signed)
[facility] to inform patient

## 2019-05-26 NOTE — Progress Notes (Signed)
   Subjective:  Audio only  Patient ID: Brianna Griffin, female    DOB: 02-12-1986, 34 y.o.   MRN: [id number]  HPI wants to discuss anxiety and depression. Gad 7 and phq9 done.   Virtual Visit via Telephone Note  I connected with Brianna Griffin on 05/26/19 at  1:10 PM EST by telephone and verified that I am speaking with the correct person using two identifiers.  Location: Patient: home Provider: office   I discussed the limitations, risks, security and privacy concerns of performing an evaluation and management service by telephone and the availability of in person appointments. I also discussed with the patient that there may be a patient responsible charge related to this service. The patient expressed understanding and agreed to proceed.   History of Present Illness:    Observations/Objective:   Assessment and Plan:   Follow Up Instructions:    I discussed the assessment and treatment plan with the patient. The patient was provided an opportunity to ask questions and all were answered. The patient agreed with the plan and demonstrated an understanding of the instructions.   The patient was advised to call back or seek an in-person evaluation if the symptoms worsen or if the condition fails to improve as anticipated.  I provided 25 minutes of non-face-to-face time during this encounter.  Medical coding   At home since spring  Not really sure what to think on the anx depr med   Has child in daycare  Daughter doing homeschooling   remarrried   On lex at 20 mg dose for a year ish  depr and anxiety both ab isssue  Worsens   Review of Systems No headache, no major weight loss or weight gain, no chest pain no back pain abdominal pain no change in bowel habits complete ROS otherwise negative     Objective:   Physical Exam  Virtual      Assessment & Plan:  Impression depression with elements of anxiety.  Discussed at great length.  Patient struggled  with this in the past.  Has been on Lexapro.  20 mg for some time now.  Positive family history.  Positive flares since last childbirth several years ago.  Positive stress at work.  Will add Wellbutrin rationale discussed.  Side effects benefits discussed.  Diet exercise discussed follow-up as scheduled

## 2019-05-26 NOTE — Telephone Encounter (Signed)
[facility] contacted office and states that the bupropion 300 mg can not be cut in half due to the way they release. Pharmacist states they can give pt Bupropion 150 mg to take at bedtime for 7 days and then take 2 tablets afterwards. With next script they can give pt 300 mg tablets. Please advise. Thank you

## 2019-05-27 NOTE — Telephone Encounter (Signed)
Pt returned call and verbalized understanding  

## 2019-06-16 ENCOUNTER — Other Ambulatory Visit: Payer: Self-pay | Admitting: Adult Health

## 2019-06-16 MED ORDER — NORETHIN ACE-ETH ESTRAD-FE 1-20 MG-MCG PO TABS: 1.0000 | ORAL_TABLET | Freq: Every day | ORAL | 4 refills | Status: DC

## 2019-06-16 MED ORDER — NORETHIN ACE-ETH ESTRAD-FE 1-20 MG-MCG(24) PO CAPS: 1.0000 | ORAL_CAPSULE | Freq: Every day | ORAL | 4 refills | Status: DC

## 2019-06-16 MED FILL — BLISOVI FE 1/20 1-20 MG-MCG: 1-20 | 84 days supply | Qty: 84 | Fill #0

## 2019-06-16 NOTE — Progress Notes (Signed)
taytulla not covered y insurance, will rx junel 1/20

## 2019-06-16 NOTE — Progress Notes (Signed)
Refilled Brianna Griffin

## 2019-06-21 ENCOUNTER — Encounter: Payer: Self-pay | Admitting: Family Medicine

## 2019-07-23 ENCOUNTER — Encounter: Payer: Self-pay | Admitting: Adult Health

## 2019-07-23 ENCOUNTER — Other Ambulatory Visit: Payer: Self-pay

## 2019-07-23 ENCOUNTER — Ambulatory Visit (INDEPENDENT_AMBULATORY_CARE_PROVIDER_SITE_OTHER): Payer: No Typology Code available for payment source | Admitting: Adult Health

## 2019-07-23 VITALS — BP 149/94 | HR 105 | Ht 64.0 in | Wt 154.0 lb

## 2019-07-23 DIAGNOSIS — F418 Other specified anxiety disorders: Secondary | ICD-10-CM

## 2019-07-23 DIAGNOSIS — R030 Elevated blood-pressure reading, without diagnosis of hypertension: Secondary | ICD-10-CM | POA: Insufficient documentation

## 2019-07-23 DIAGNOSIS — Z01419 Encounter for gynecological examination (general) (routine) without abnormal findings: Secondary | ICD-10-CM | POA: Insufficient documentation

## 2019-07-23 MED ORDER — LOSARTAN POTASSIUM 25 MG PO TABS: 25.0000 mg | ORAL_TABLET | Freq: Every day | ORAL | 1 refills | Status: DC

## 2019-07-23 MED FILL — LOSARTAN POTASSIUM 25 MG TA: 25 | 30 days supply | Qty: 30 | Fill #0

## 2019-07-23 NOTE — Progress Notes (Signed)
Patient ID: LORAL BUDDEN, female   DOB: 08/31/1985, 34 y.o.   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 PCP is Dr Mickie Hillier   Current Medications, Allergies, Past Medical History, Past Surgical History, Family History and Social History were reviewed in [facility] record.     Review of Systems: Patient denies any headaches, hearing loss, fatigue, blurred vision, shortness of breath, chest pain, abdominal pain, problems with bowel movements, urination, or intercourse. No joint pain or mood swings.   Physical Exam:BP (!) 149/94 (BP Location: Right Arm, Cuff Size: Normal)   Pulse (!) 105   Ht 5\' 4"  (1.626 m)   Wt 154 lb (69.9 kg)   LMP 07/20/2019 (Exact Date)   BMI 26.43 kg/m  General:  Well developed, well nourished, no acute distress Skin:  Warm and dry Neck:  Midline trachea, normal thyroid, good ROM, no lymphadenopathy Lungs; Clear to auscultation bilaterally Breast:  No dominant palpable mass, retraction, or nipple discharge Cardiovascular: Regular rate and rhythm Abdomen:  Soft, non tender, no hepatosplenomegaly Pelvic:  External genitalia is normal in appearance, no lesions.  The vagina is normal in appearance. Urethra has no lesions or masses. The cervix is bulbous.  Uterus is felt to be normal size, shape, and contour.  No adnexal masses or tenderness noted.Bladder is non tender, no masses felt. Extremities/musculoskeletal:  No swelling or varicosities noted, no clubbing or cyanosis Psych:  No mood changes, alert and cooperative,seems happy Fall risk is low PHQ 2 score is0 Co exam with Terri Skains [employer] student  Impression and Plan: 1. Encounter for well woman exam with routine gynecological exam Physical in 1 year Pap in 2023 Mammogram at 40 Declines  labs today  2. Depression with anxiety Continue Wellbutrin and lexapro, has refillshas not used vistaril in long time but still hs some  3. Elevated BP without diagnosis of hypertension Will rx losartan Meds ordered this encounter  Medications  . losartan (COZAAR) 25 MG tablet    Sig: Take 1 tablet (25 mg total) by mouth daily.    Dispense:  30 tablet    Refill:  1    Order Specific Question:   Supervising Provider    Answer:   Tania Ade H [[id number]]  And recheck BP in 4 weeks Review DASH diet

## 2019-07-23 NOTE — Patient Instructions (Signed)
DASH Eating Plan DASH stands for "Dietary Approaches to Stop Hypertension." The DASH eating plan is a healthy eating plan that has been shown to reduce high blood pressure (hypertension). It may also reduce your risk for type 2 diabetes, heart disease, and stroke. The DASH eating plan may also help with weight loss. What are tips for following this plan?  General guidelines  Avoid eating more than 2,300 mg (milligrams) of salt (sodium) a day. If you have hypertension, you may need to reduce your sodium intake to 1,500 mg a day.  Limit alcohol intake to no more than 1 drink a day for nonpregnant women and 2 drinks a day for men. One drink equals 12 oz of beer, 5 oz of wine, or 1 oz of hard liquor.  Work with your health care provider to maintain a healthy body weight or to lose weight. Ask what an ideal weight is for you.  Get at least 30 minutes of exercise that causes your heart to beat faster (aerobic exercise) most days of the week. Activities may include walking, swimming, or biking.  Work with your health care provider or diet and nutrition specialist (dietitian) to adjust your eating plan to your individual calorie needs. Reading food labels   Check food labels for the amount of sodium per serving. Choose foods with less than 5 percent of the Daily Value of sodium. Generally, foods with less than 300 mg of sodium per serving fit into this eating plan.  To find whole grains, look for the word "whole" as the first word in the ingredient list. Shopping  Buy products labeled as "low-sodium" or "no salt added."  Buy fresh foods. Avoid canned foods and premade or frozen meals. Cooking  Avoid adding salt when cooking. Use salt-free seasonings or herbs instead of table salt or sea salt. Check with your health care provider or pharmacist before using salt substitutes.  Do not fry foods. Cook foods using healthy methods such as baking, boiling, grilling, and broiling instead.  Cook with  heart-healthy oils, such as olive, canola, soybean, or sunflower oil. Meal planning  Eat a balanced diet that includes: ? 5 or more servings of fruits and vegetables each day. At each meal, try to fill half of your plate with fruits and vegetables. ? Up to 6-8 servings of whole grains each day. ? Less than 6 oz of lean meat, poultry, or fish each day. A 3-oz serving of meat is about the same size as a deck of cards. One egg equals 1 oz. ? 2 servings of low-fat dairy each day. ? A serving of nuts, seeds, or beans 5 times each week. ? Heart-healthy fats. Healthy fats called Omega-3 fatty acids are found in foods such as flaxseeds and coldwater fish, like sardines, salmon, and mackerel.  Limit how much you eat of the following: ? Canned or prepackaged foods. ? Food that is high in trans fat, such as fried foods. ? Food that is high in saturated fat, such as fatty meat. ? Sweets, desserts, sugary drinks, and other foods with added sugar. ? Full-fat dairy products.  Do not salt foods before eating.  Try to eat at least 2 vegetarian meals each week.  Eat more home-cooked food and less restaurant, buffet, and fast food.  When eating at a restaurant, ask that your food be prepared with less salt or no salt, if possible. What foods are recommended? The items listed may not be a complete list. Talk with your dietitian about   what dietary choices are best for you. Grains Whole-grain or whole-wheat bread. Whole-grain or whole-wheat pasta. Brown rice. Oatmeal. Quinoa. Bulgur. Whole-grain and low-sodium cereals. Pita bread. Low-fat, low-sodium crackers. Whole-wheat flour tortillas. Vegetables Fresh or frozen vegetables (raw, steamed, roasted, or grilled). Low-sodium or reduced-sodium tomato and vegetable juice. Low-sodium or reduced-sodium tomato sauce and tomato paste. Low-sodium or reduced-sodium canned vegetables. Fruits All fresh, dried, or frozen fruit. Canned fruit in natural juice (without  added sugar). Meat and other protein foods Skinless chicken or turkey. Ground chicken or turkey. Pork with fat trimmed off. Fish and seafood. Egg whites. Dried beans, peas, or lentils. Unsalted nuts, nut butters, and seeds. Unsalted canned beans. Lean cuts of beef with fat trimmed off. Low-sodium, lean deli meat. Dairy Low-fat (1%) or fat-free (skim) milk. Fat-free, low-fat, or reduced-fat cheeses. Nonfat, low-sodium ricotta or cottage cheese. Low-fat or nonfat yogurt. Low-fat, low-sodium cheese. Fats and oils Soft margarine without trans fats. Vegetable oil. Low-fat, reduced-fat, or light mayonnaise and salad dressings (reduced-sodium). Canola, safflower, olive, soybean, and sunflower oils. Avocado. Seasoning and other foods Herbs. Spices. Seasoning mixes without salt. Unsalted popcorn and pretzels. Fat-free sweets. What foods are not recommended? The items listed may not be a complete list. Talk with your dietitian about what dietary choices are best for you. Grains Baked goods made with fat, such as croissants, muffins, or some breads. Dry pasta or rice meal packs. Vegetables Creamed or fried vegetables. Vegetables in a cheese sauce. Regular canned vegetables (not low-sodium or reduced-sodium). Regular canned tomato sauce and paste (not low-sodium or reduced-sodium). Regular tomato and vegetable juice (not low-sodium or reduced-sodium). Pickles. Olives. Fruits Canned fruit in a light or heavy syrup. Fried fruit. Fruit in cream or butter sauce. Meat and other protein foods Fatty cuts of meat. Ribs. Fried meat. Bacon. Sausage. Bologna and other processed lunch meats. Salami. Fatback. Hotdogs. Bratwurst. Salted nuts and seeds. Canned beans with added salt. Canned or smoked fish. Whole eggs or egg yolks. Chicken or turkey with skin. Dairy Whole or 2% milk, cream, and half-and-half. Whole or full-fat cream cheese. Whole-fat or sweetened yogurt. Full-fat cheese. Nondairy creamers. Whipped toppings.  Processed cheese and cheese spreads. Fats and oils Butter. Stick margarine. Lard. Shortening. Ghee. Bacon fat. Tropical oils, such as coconut, palm kernel, or palm oil. Seasoning and other foods Salted popcorn and pretzels. Onion salt, garlic salt, seasoned salt, table salt, and sea salt. Worcestershire sauce. Tartar sauce. Barbecue sauce. Teriyaki sauce. Soy sauce, including reduced-sodium. Steak sauce. Canned and packaged gravies. Fish sauce. Oyster sauce. Cocktail sauce. Horseradish that you find on the shelf. Ketchup. Mustard. Meat flavorings and tenderizers. Bouillon cubes. Hot sauce and Tabasco sauce. Premade or packaged marinades. Premade or packaged taco seasonings. Relishes. Regular salad dressings. Where to find more information:  National Heart, Lung, and Blood Institute: www.nhlbi.nih.gov  American Heart Association: www.heart.org Summary  The DASH eating plan is a healthy eating plan that has been shown to reduce high blood pressure (hypertension). It may also reduce your risk for type 2 diabetes, heart disease, and stroke.  With the DASH eating plan, you should limit salt (sodium) intake to 2,300 mg a day. If you have hypertension, you may need to reduce your sodium intake to 1,500 mg a day.  When on the DASH eating plan, aim to eat more fresh fruits and vegetables, whole grains, lean proteins, low-fat dairy, and heart-healthy fats.  Work with your health care provider or diet and nutrition specialist (dietitian) to adjust your eating plan to your   individual calorie needs. This information is not intended to replace advice given to you by your health care provider. Make sure you discuss any questions you have with your health care provider. Document Revised: 04/18/2017 Document Reviewed: 04/29/2016 Elsevier Patient Education  2020 Elsevier Inc.  

## 2019-08-16 ENCOUNTER — Ambulatory Visit: Payer: No Typology Code available for payment source | Admitting: Family Medicine

## 2019-08-23 ENCOUNTER — Ambulatory Visit: Payer: No Typology Code available for payment source | Admitting: Adult Health

## 2019-08-25 ENCOUNTER — Telehealth: Payer: Self-pay | Admitting: Family Medicine

## 2019-08-25 MED ORDER — ALBUTEROL SULFATE HFA 108 (90 BASE) MCG/ACT IN AERS: INHALATION_SPRAY | RESPIRATORY_TRACT | 0 refills | Status: DC

## 2019-08-25 MED FILL — ALBUTEROL SULFATE HFA 108 (: 108 (90 BAS | 17 days supply | Qty: 18 | Fill #0

## 2019-08-25 NOTE — Telephone Encounter (Signed)
Ok times one 

## 2019-08-25 NOTE — Telephone Encounter (Signed)
Prescription sent electronically to pharmacy. Patient notified. 

## 2019-08-25 NOTE — Telephone Encounter (Signed)
Has an appt on 4-14 but is out of her inhaler: albuterol (VENTOLIN HFA) 108 (90 Base) MCG/ACT inhaler     [facility] outpt pharm

## 2019-08-29 MED FILL — ESCITALOPRAM 20 MG TABLET: 20 | 90 days supply | Qty: 90 | Fill #1

## 2019-08-30 MED FILL — buPROPion HCL ER (XL) 150 M: 150 | 90 days supply | Qty: 180 | Fill #1

## 2019-09-01 ENCOUNTER — Ambulatory Visit (INDEPENDENT_AMBULATORY_CARE_PROVIDER_SITE_OTHER): Payer: No Typology Code available for payment source | Admitting: Family Medicine

## 2019-09-01 ENCOUNTER — Other Ambulatory Visit: Payer: Self-pay | Admitting: Family Medicine

## 2019-09-01 ENCOUNTER — Other Ambulatory Visit: Payer: Self-pay

## 2019-09-01 DIAGNOSIS — F411 Generalized anxiety disorder: Secondary | ICD-10-CM | POA: Diagnosis not present

## 2019-09-01 DIAGNOSIS — F321 Major depressive disorder, single episode, moderate: Secondary | ICD-10-CM | POA: Diagnosis not present

## 2019-09-01 MED ORDER — ESCITALOPRAM OXALATE 20 MG PO TABS: ORAL_TABLET | ORAL | 1 refills | Status: DC

## 2019-09-01 MED ORDER — BUPROPION HCL ER (XL) 300 MG PO TB24: ORAL_TABLET | ORAL | 1 refills | Status: DC

## 2019-09-01 NOTE — Progress Notes (Signed)
   Subjective:    Patient ID: Brianna Griffin, female    DOB: [birth year], 34 y.o.   MRN: [id number]  HPI Patient calls in today for follow up on medications.   PHQ-9 Virtual Visit via Video Note  I connected with Brianna Griffin on 09/01/19 at  2:00 PM EDT by a video enabled telemedicine application and verified that I am speaking with the correct person using two identifiers.  Location: Patient: home Provider: office   I discussed the limitations of evaluation and management by telemedicine and the availability of in person appointments. The patient expressed understanding and agreed to proceed.  History of Present Illness:    Observations/Objective:   Assessment and Plan:   Follow Up Instructions:    I discussed the assessment and treatment plan with the patient. The patient was provided an opportunity to ask questions and all were answered. The patient agreed with the plan and demonstrated an understanding of the instructions.   The patient was advised to call back or seek an in-person evaluation if the symptoms worsen or if the condition fails to improve as anticipated.  I provided 20 minutes of non-face-to-face time during this encounter.      Review of Systems No headache, no major weight loss or weight gain, no chest pain no back pain abdominal pain no change in bowel habits complete ROS otherwise negative     Objective:   Physical Exam  Virtual      Assessment & Plan:  Impression generalized anxiety disorder.  With element of depression.  Much improved on the addition of Wellbutrin to Lexapro  2.  Antihypertensive agents recently initiated.  Discussed.  Questions answered.

## 2019-09-08 MED FILL — BLISOVI FE 1/20 1-20 MG-MCG: 1-20 | 84 days supply | Qty: 84 | Fill #1

## 2019-09-13 ENCOUNTER — Encounter: Payer: Self-pay | Admitting: Adult Health

## 2019-09-13 ENCOUNTER — Other Ambulatory Visit: Payer: Self-pay

## 2019-09-13 ENCOUNTER — Ambulatory Visit (INDEPENDENT_AMBULATORY_CARE_PROVIDER_SITE_OTHER): Payer: No Typology Code available for payment source | Admitting: Adult Health

## 2019-09-13 VITALS — BP 131/83 | HR 90 | Ht 64.0 in | Wt 154.0 lb

## 2019-09-13 DIAGNOSIS — I10 Essential (primary) hypertension: Secondary | ICD-10-CM | POA: Insufficient documentation

## 2019-09-13 MED ORDER — LOSARTAN POTASSIUM 25 MG PO TABS: 25.0000 mg | ORAL_TABLET | Freq: Every day | ORAL | 6 refills | Status: DC

## 2019-09-13 MED FILL — LOSARTAN POTASSIUM 25 MG TA: 25 | 30 days supply | Qty: 30 | Fill #0

## 2019-09-13 NOTE — Progress Notes (Signed)
  Subjective:     Patient ID: Brianna Griffin, female   DOB: 06/17/1985, 34 y.o.   MRN: [id number]  HPI Brianna Griffin is a 34 year old white female,married, [id number], back in follow up on starting losartan and BP is better.She is thinking of stopping birth control in about 3 months to try to get pregnant. PCP is Dr Mickie Hillier.  Review of Systems  Doing good on BP meds, weight stable Reviewed past medical,surgical, social and family history. Reviewed medications and allergies.     Objective:   Physical Exam BP 131/83 (BP Location: Left Arm, Patient Position: Sitting, Cuff Size: Normal)   Pulse 90   Ht 5\' 4"  (1.626 m)   Wt 154 lb (69.9 kg)   BMI 26.43 kg/m  Skin warm and dry. Lungs: clear to ausculation bilaterally. Cardiovascular: regular rate and rhythm.    Assessment:    1. Hypertension, unspecified type Continue losartan Meds ordered this encounter  Medications  . losartan (COZAAR) 25 MG tablet    Sig: Take 1 tablet (25 mg total) by mouth daily.    Dispense:  30 tablet    Refill:  6    Order Specific Question:   Supervising Provider    Answer:   Tania Ade H [[id number]]  Try to lose about 15 lbs, if still wanting to get pregnant will stop losartan then, and rx labetalol if needed      Plan:     Follow up with me in 3 months

## 2019-11-04 ENCOUNTER — Telehealth: Payer: No Typology Code available for payment source | Admitting: Family

## 2019-11-04 DIAGNOSIS — L03119 Cellulitis of unspecified part of limb: Secondary | ICD-10-CM

## 2019-11-04 MED ORDER — CEPHALEXIN 500 MG PO CAPS
500.0000 mg | ORAL_CAPSULE | Freq: Four times a day (QID) | ORAL | 0 refills | Status: DC
Start: 2019-11-04 — End: 2019-12-13

## 2019-11-04 NOTE — Progress Notes (Signed)
E Visit for Cellulitis  We are sorry that you are not feeling well. Here is how we plan to help!  Based on what you shared with me it looks like you have cellulitis.  Cellulitis looks like areas of skin redness, swelling, and warmth; it develops as a result of bacteria entering under the skin. Little red spots and/or bleeding can be seen in skin, and tiny surface sacs containing fluid can occur. Fever can be present. Cellulitis is almost always on one side of a body, and the lower limbs are the most common site of involvement.   I have prescribed:  Keflex 500mg  take one by mouth four times a day for 5 days  HOME CARE:  . Take your medications as ordered and take all of them, even if the skin irritation appears to be healing.   GET HELP RIGHT AWAY IF:  . Symptoms that don't begin to go away within 48 hours. . Severe redness persists or worsens . If the area turns color, spreads or swells. . If it blisters and opens, develops yellow-brown crust or bleeds. . You develop a fever or chills. . If the pain increases or becomes unbearable.  . Are unable to keep fluids and food down.  MAKE SURE YOU    Understand these instructions.  Will watch your condition.  Will get help right away if you are not doing well or get worse.  Thank you for choosing an e-visit. Your e-visit answers were reviewed by a board certified advanced clinical practitioner to complete your personal care plan. Depending upon the condition, your plan could have included both over the counter or prescription medications. Please review your pharmacy choice. Make sure the pharmacy is open so you can pick up prescription now. If there is a problem, you may contact your provider through [employer] and have the prescription routed to another pharmacy. Your safety is important to [place]. If you have drug allergies check your prescription carefully.  For the next 24 hours you can use MyChart to ask questions about today's  visit, request a non-urgent call back, or ask for a work or school excuse. You will get an email in the next two days asking about your experience. I hope that your e-visit has been valuable and will speed your recovery.  Greater than 5 minutes, yet less than 10 minutes of time have been spent researching, coordinating, and implementing care for this patient today.  Thank you for the details you included in the comment boxes. Those details are very helpful in determining the best course of treatment for you and help [place] to provide the best care.

## 2019-11-15 ENCOUNTER — Other Ambulatory Visit: Payer: Self-pay | Admitting: Adult Health

## 2019-11-29 MED FILL — ESCITALOPRAM 20 MG TABLET: 20 | 90 days supply | Qty: 90 | Fill #0

## 2019-11-29 MED FILL — buPROPion HCL ER (XL) 300 M: 300 | 90 days supply | Qty: 90 | Fill #0

## 2019-12-03 ENCOUNTER — Telehealth: Payer: Self-pay | Admitting: Family Medicine

## 2019-12-03 ENCOUNTER — Other Ambulatory Visit: Payer: Self-pay | Admitting: Family Medicine

## 2019-12-03 MED ORDER — ESCITALOPRAM OXALATE 20 MG PO TABS: ORAL_TABLET | ORAL | 0 refills | Status: DC

## 2019-12-03 MED ORDER — BUPROPION HCL ER (XL) 300 MG PO TB24: ORAL_TABLET | ORAL | 0 refills | Status: DC

## 2019-12-03 NOTE — Telephone Encounter (Signed)
schedule

## 2019-12-03 NOTE — Telephone Encounter (Signed)
Please schedule visit

## 2019-12-03 NOTE — Telephone Encounter (Signed)
Refilled wellbutrin and celexa. Dr. Lovena Le

## 2019-12-03 NOTE — Telephone Encounter (Signed)
Patient scheduled appointment 8/9 for medication followup

## 2019-12-09 MED FILL — LOSARTAN POTASSIUM 25 MG TA: 25 | 30 days supply | Qty: 30 | Fill #2

## 2019-12-13 ENCOUNTER — Ambulatory Visit (INDEPENDENT_AMBULATORY_CARE_PROVIDER_SITE_OTHER): Payer: No Typology Code available for payment source | Admitting: Adult Health

## 2019-12-13 ENCOUNTER — Encounter: Payer: Self-pay | Admitting: Adult Health

## 2019-12-13 VITALS — BP 128/81 | HR 78 | Ht 64.0 in | Wt 155.0 lb

## 2019-12-13 DIAGNOSIS — I10 Essential (primary) hypertension: Secondary | ICD-10-CM | POA: Diagnosis not present

## 2019-12-13 DIAGNOSIS — Z319 Encounter for procreative management, unspecified: Secondary | ICD-10-CM

## 2019-12-13 MED ORDER — LABETALOL HCL 200 MG PO TABS
200.0000 mg | ORAL_TABLET | Freq: Two times a day (BID) | ORAL | 6 refills | Status: DC
Start: 2019-12-13 — End: 2019-12-27

## 2019-12-13 MED FILL — LABETALOL HCL 200 MG TABS: 200 | 30 days supply | Qty: 60 | Fill #0

## 2019-12-13 NOTE — Progress Notes (Signed)
°  Subjective:     Patient ID: Brianna Griffin, female   DOB: [birth year], 34 y.o.   MRN: [id number]  HPI Brianna Griffin is a 34 year old white female,married,[id number] in for BP check and she stopped OCs with this period. PCP is Dr Lovena Le  Review of Systems Feels good, no headaches Reviewed past medical,surgical, social and family history. Reviewed medications and allergies.     Objective:   Physical Exam BP 128/81 (BP Location: Left Arm, Patient Position: Sitting, Cuff Size: Normal)    Pulse 78    Ht 5\' 4"  (1.626 m)    Wt 155 lb (70.3 kg)    LMP 12/07/2019 (Exact Date)    BMI 26.61 kg/m  Skin warm and dry. Lungs: clear to ausculation bilaterally. Cardiovascular: regular rate and rhythm. PHQ 9 scor eis 1  [facility] - 12/13/19 1520      Pregnancy Intention Screening   Does the patient want to become pregnant in the next year? Yes    Does the patient's partner want to become pregnant in the next year? Yes    Would the patient like to discuss contraceptive options today? No      Contraception Wrap Up   Current Method No Method - Other Reason    End Method No Method - Other Reason    Contraception Counseling Provided No             Assessment:     1. Hypertension, unspecified type Stop losartan and start labetalol Meds ordered this encounter  Medications   labetalol (NORMODYNE) 200 MG tablet    Sig: Take 1 tablet (200 mg total) by mouth 2 (two) times daily.    Dispense:  60 tablet    Refill:  6    Order Specific Question:   Supervising Provider    Answer:   Tania Ade H [[id number]]  - check BP at home   2. Patient desires pregnancy Take PNV Have sex every other day 7-24 of cycle, pee before sex and lay there 30 minutes after     Plan:     Follow up in 8 weeks for BP check with me

## 2019-12-27 ENCOUNTER — Encounter: Payer: Self-pay | Admitting: Family Medicine

## 2019-12-27 ENCOUNTER — Ambulatory Visit (INDEPENDENT_AMBULATORY_CARE_PROVIDER_SITE_OTHER): Payer: No Typology Code available for payment source | Admitting: Family Medicine

## 2019-12-27 ENCOUNTER — Other Ambulatory Visit: Payer: Self-pay

## 2019-12-27 VITALS — BP 128/80 | HR 90 | Temp 97.5°F | Wt 154.8 lb

## 2019-12-27 DIAGNOSIS — I10 Essential (primary) hypertension: Secondary | ICD-10-CM | POA: Diagnosis not present

## 2019-12-27 DIAGNOSIS — F418 Other specified anxiety disorders: Secondary | ICD-10-CM | POA: Diagnosis not present

## 2019-12-27 DIAGNOSIS — M542 Cervicalgia: Secondary | ICD-10-CM

## 2019-12-27 NOTE — Progress Notes (Signed)
Patient ID: Brianna Griffin, female    DOB: 02-11-1986, 34 y.o.   MRN: [id number]   Chief Complaint  Patient presents with  . Anxiety   Subjective:    HPI    CC- anxiety  H/o HTN in past- Pt seen gyn and in office having some elevated bp.  2x in office since 3/21. Pt started on cozaar and then changed to labetalol bc was thinking trying to get pregnant.  bp went low and then pt stopped it. Was then changed to labetalol 200mg  bid.  Was seeing 90/55, and feeling badly.  Felt lightheaded, but stopped medication.  Has been seeing 120/80s at home since then.   Asthma intermittent- albuterol prn. Not needing it now.  Anxiety- is getting hydrozyzine from gyn, prn.  Neck pain- base of skull and no pain radiating down arm or numbness. Rot to left hurts. occ it catches.  No meds. No trauma or injury. Used to pop own neck. Hasn't been doing this recently.  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   Outpatient Encounter Medications as of 12/27/2019  Medication Sig  . albuterol (VENTOLIN HFA) 108 (90 Base) MCG/ACT inhaler INHALE 2 PUFFS EVERY 4 HOURS AS NEEDED FOR WHEEZING.  . Bacillus Coagulans-Inulin (PROBIOTIC FORMULA PO) Take by mouth.  Marland Kitchen buPROPion (WELLBUTRIN XL) 300 MG 24 hr tablet Take one tablet qhs  . Cetirizine HCl (ZYRTEC ALLERGY) 10 MG CAPS Zyrtec prn  . escitalopram (LEXAPRO) 20 MG tablet Take 1 tablet daily  . hydrOXYzine (ATARAX/VISTARIL) 10 MG tablet TAKE ONE TABLET BY MOUTH EVERY 8 HOURS AS NEEDED FOR ANXIETY.  . Prenatal MV-Min-Fe Fum-FA-DHA (PRENATAL 1 PO) Take by  mouth.  . [DISCONTINUED] labetalol (NORMODYNE) 200 MG tablet Take 1 tablet (200 mg total) by mouth 2 (two) times daily.   No facility-administered encounter medications on file as of 12/27/2019.     Review of Systems  Constitutional: Negative for chills and fever.  HENT: Negative for congestion, rhinorrhea and sore throat.   Respiratory: Negative for cough, shortness of breath and wheezing.   Cardiovascular: Negative for chest pain and leg swelling.  Gastrointestinal: Negative for abdominal pain, diarrhea, nausea and vomiting.  Genitourinary: Negative for dysuria and frequency.  Musculoskeletal: Positive for neck pain. Negative for arthralgias and back pain.  Skin: Negative for rash.  Neurological: Negative for dizziness, weakness and headaches.  Psychiatric/Behavioral: Negative for dysphoric mood and sleep disturbance. The patient is nervous/anxious (controlled iwth meds).      Vitals BP 128/80   Pulse 90   Temp (!) 97.5 F (36.4 C)   Wt 154 lb 12.8 oz (70.2 kg)   LMP 12/07/2019 (Exact Date)   SpO2 99%   BMI 26.57 kg/m   Objective:   Physical Exam Vitals and nursing note reviewed.  Constitutional:      Appearance: Normal appearance.  HENT:     Head: Normocephalic and atraumatic.     Nose: Nose normal.     Mouth/Throat:     Mouth: Mucous membranes are moist.     Pharynx: Oropharynx is clear.  Eyes:     Extraocular Movements: Extraocular movements intact.     Conjunctiva/sclera: Conjunctivae normal.     Pupils: Pupils are equal, round, and reactive  to light.  Cardiovascular:     Rate and Rhythm: Normal rate and regular rhythm.     Pulses: Normal pulses.     Heart sounds: Normal heart sounds.  Pulmonary:     Effort: Pulmonary effort is normal.     Breath sounds: Normal breath sounds. No wheezing, rhonchi or rales.  Musculoskeletal:        General: Normal range of motion.     Right lower leg: No edema.     Left lower leg: No edema.  Skin:    General: Skin is  warm and dry.     Findings: No lesion or rash.  Neurological:     General: No focal deficit present.     Mental Status: She is alert and oriented to person, place, and time.  Psychiatric:        Mood and Affect: Mood normal.        Behavior: Behavior normal.      Assessment and Plan   1. Depression with anxiety  2. Neck pain - Ambulatory referral to Chiropractic  3. Essential hypertension   Depression/anxiety- stable, controlled.  cont with lexapro and wellbutrin. Use hydroxyzine prn.  Neck pain- pt wanting referral to chiropractic.  Tylenol/ibuprofen prn.  htn- improved, controlled.  Pt discontinued the labetalol.  Pt feeling better and just monitoring bp.   F/u 75mo.

## 2020-01-04 ENCOUNTER — Encounter: Payer: Self-pay | Admitting: Family Medicine

## 2020-01-06 ENCOUNTER — Encounter: Payer: Self-pay | Admitting: Family Medicine

## 2020-02-07 ENCOUNTER — Ambulatory Visit: Payer: No Typology Code available for payment source | Admitting: Adult Health

## 2020-03-08 ENCOUNTER — Encounter: Payer: Self-pay | Admitting: Family Medicine

## 2020-03-08 ENCOUNTER — Ambulatory Visit (INDEPENDENT_AMBULATORY_CARE_PROVIDER_SITE_OTHER): Payer: No Typology Code available for payment source | Admitting: Family Medicine

## 2020-03-08 ENCOUNTER — Ambulatory Visit (INDEPENDENT_AMBULATORY_CARE_PROVIDER_SITE_OTHER): Payer: No Typology Code available for payment source | Admitting: *Deleted

## 2020-03-08 ENCOUNTER — Other Ambulatory Visit: Payer: Self-pay

## 2020-03-08 VITALS — BP 112/78 | HR 81 | Temp 98.0°F | Ht 64.0 in | Wt 156.2 lb

## 2020-03-08 VITALS — BP 132/83 | HR 86 | Ht 64.0 in | Wt 156.0 lb

## 2020-03-08 DIAGNOSIS — L723 Sebaceous cyst: Secondary | ICD-10-CM

## 2020-03-08 DIAGNOSIS — Z3201 Encounter for pregnancy test, result positive: Secondary | ICD-10-CM

## 2020-03-08 LAB — POCT URINE PREGNANCY: Preg Test, Ur: POSITIVE — AB

## 2020-03-08 MED FILL — buPROPion HCL ER (XL) 300 M: 300 | 90 days supply | Qty: 90 | Fill #1

## 2020-03-08 NOTE — Progress Notes (Signed)
Chart reviewed for nurse visit. Agree with plan of care. Will check QHCG and progesterone level  Lotta, [place], NP 03/08/2020 2:58 PM

## 2020-03-08 NOTE — Progress Notes (Signed)
Chart reviewed for nurse visit. Agree with plan of care.  Estill Dooms, NP 03/08/2020 2:57 PM

## 2020-03-08 NOTE — Progress Notes (Signed)
     Patient ID: Brianna Griffin, female    DOB: 05/09/1986, 34 y.o.   MRN: [id number]   Chief Complaint  Patient presents with  . Cyst   Subjective:    HPI Pt has "knot" on back on neck. Has been there for a while. Step mom was playing with her and it looked black in the center. No pain or tenderness. No growth.  Has had lump on back of neck, has tried to pop it and it goes down, but it returns.  No pain or redness.  Just wanting it checked.  No h/o melanoma.  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   Outpatient Encounter Medications as of 03/08/2020  Medication Sig  . albuterol (VENTOLIN HFA) 108 (90 Base) MCG/ACT inhaler INHALE 2 PUFFS EVERY 4 HOURS AS NEEDED FOR WHEEZING.  . Bacillus Coagulans-Inulin (PROBIOTIC FORMULA PO) Take by mouth.  Marland Kitchen buPROPion (WELLBUTRIN XL) 300 MG 24 hr tablet Take one tablet qhs  . Cetirizine HCl (ZYRTEC ALLERGY) 10 MG CAPS Zyrtec prn  . escitalopram (LEXAPRO) 20 MG tablet Take 1 tablet daily  . hydrOXYzine (ATARAX/VISTARIL) 10 MG tablet TAKE ONE TABLET BY MOUTH EVERY 8 HOURS AS NEEDED FOR ANXIETY.  . Prenatal MV-Min-Fe Fum-FA-DHA (PRENATAL 1 PO) Take by mouth.   No facility-administered encounter medications on file as of 03/08/2020.     Review of Systems  Skin:       +skin bump left base of neck.    Vitals BP 112/78   Pulse 81   Temp 98 F (36.7 C)   Ht 5\' 4"  (1.626 m)   Wt 156 lb 3.2 oz (70.9 kg)   LMP 02/09/2020   SpO2 100%   BMI 26.81 kg/m   Objective:   Physical Exam Vitals and nursing note reviewed.  Constitutional:       General: She is not in acute distress.    Appearance: Normal appearance.  Musculoskeletal:     Cervical back: Normal range of motion and neck supple. No tenderness.  Skin:    General: Skin is warm and dry.     Comments: Small 0.5cm, raised bump with a black head in center. Appears to be sebaceous cyst. No erythema,warmth, drainage or pain.  Neurological:     Mental Status: She is alert and oriented to person, place, and time.    Assessment and Plan   1. Sebaceous cyst  stable, no sign of infection or pain.  Pt seen for concern of small cyst on let upper shoulder.  Not infected.  Advising to watch it, offered to lance it, but it's not infected and could just do warm compress and if getting red, hot, painful to return and we can I&D it.    Pt in agreement to return if worsening.  F/u prn.

## 2020-03-08 NOTE — Progress Notes (Signed)
   NURSE VISIT- PREGNANCY CONFIRMATION   SUBJECTIVE:  Brianna Griffin is a 34 y.o. [phone] female at [redacted]w[redacted]d by certain LMP of Patient's last menstrual period was 02/09/2020. Here for pregnancy confirmation.  Home pregnancy test: positive x 4  She reports no complaints.  She is taking prenatal vitamins.    OBJECTIVE:  BP 132/83 (BP Location: Right Arm, Patient Position: Sitting, Cuff Size: Normal)   Pulse 86   Ht 5\' 4"  (1.626 m)   Wt 156 lb (70.8 kg)   LMP 02/09/2020   BMI 26.78 kg/m   Appears well, in no apparent distress OB History  Gravida Para Term Preterm AB Living  5 2 2   2 2   SAB TAB Ectopic Multiple Live Births  2     0 2    # Outcome Date GA Lbr Len/2nd Weight Sex Delivery Anes PTL Lv  5 Current           4 Term 09/01/15 [redacted]w[redacted]d 13:50 / 00:08 7 lb 7.4 oz (3.385 kg) M Vag-Spont EPI  LIV  3 SAB 02/2014          2 SAB 08/2013 [redacted]w[redacted]d         1 Term 2010 [redacted]w[redacted]d  5 lb 15 oz (2.693 kg) F Vag-Spont   LIV    Results for orders placed or performed in visit on 03/08/20 (from the past 24 hour(s))  POCT urine pregnancy   Collection Time: 03/08/20  2:46 PM  Result Value Ref Range   Preg Test, Ur Positive (A) Negative    ASSESSMENT: Positive pregnancy test, [redacted]w[redacted]d by LMP    PLAN: Schedule for dating ultrasound in 3 weeks Prenatal vitamins: continue   Nausea medicines: not currently needed   OB packet given: Yes  [name]  03/08/2020 2:50 PM

## 2020-03-09 LAB — BETA HCG QUANT (REF LAB): hCG Quant: 18 m[IU]/mL

## 2020-03-09 LAB — PROGESTERONE: Progesterone: 5.8 ng/mL

## 2020-03-10 ENCOUNTER — Other Ambulatory Visit: Payer: Self-pay | Admitting: Adult Health

## 2020-03-10 DIAGNOSIS — Z3201 Encounter for pregnancy test, result positive: Secondary | ICD-10-CM

## 2020-03-10 MED ORDER — PROGESTERONE 200 MG PO CAPS: ORAL_CAPSULE | ORAL | 3 refills | Status: DC

## 2020-03-10 NOTE — Progress Notes (Signed)
Ck QHCG  And rx prometrium

## 2020-03-11 LAB — BETA HCG QUANT (REF LAB): hCG Quant: 12 m[IU]/mL

## 2020-03-13 ENCOUNTER — Other Ambulatory Visit: Payer: Self-pay | Admitting: Adult Health

## 2020-03-13 DIAGNOSIS — Z3201 Encounter for pregnancy test, result positive: Secondary | ICD-10-CM

## 2020-03-13 NOTE — Progress Notes (Signed)
Bleeding now and QHCG dropping, recheck [facility]

## 2020-03-15 LAB — BETA HCG QUANT (REF LAB): hCG Quant: <1 m[IU]/mL

## 2020-03-20 MED FILL — ESCITALOPRAM 20 MG TABLET: 20 | 90 days supply | Qty: 90 | Fill #1

## 2020-03-29 ENCOUNTER — Other Ambulatory Visit: Payer: No Typology Code available for payment source

## 2020-04-04 ENCOUNTER — Other Ambulatory Visit: Payer: Self-pay | Admitting: Adult Health

## 2020-04-04 DIAGNOSIS — Z3201 Encounter for pregnancy test, result positive: Secondary | ICD-10-CM

## 2020-04-04 NOTE — Progress Notes (Signed)
Ck QHCG and progesterone level,had +HPT

## 2020-04-05 ENCOUNTER — Other Ambulatory Visit: Payer: Self-pay | Admitting: Adult Health

## 2020-04-05 DIAGNOSIS — Z3201 Encounter for pregnancy test, result positive: Secondary | ICD-10-CM

## 2020-04-05 LAB — BETA HCG QUANT (REF LAB): hCG Quant: 40 m[IU]/mL

## 2020-04-05 LAB — PROGESTERONE: Progesterone: 27.6 ng/mL

## 2020-04-07 ENCOUNTER — Other Ambulatory Visit: Payer: Self-pay | Admitting: Adult Health

## 2020-04-07 DIAGNOSIS — Z3201 Encounter for pregnancy test, result positive: Secondary | ICD-10-CM

## 2020-04-07 LAB — BETA HCG QUANT (REF LAB): hCG Quant: 163 m[IU]/mL

## 2020-04-07 NOTE — Progress Notes (Signed)
Recheck QHCG and progesterone

## 2020-04-11 LAB — BETA HCG QUANT (REF LAB): hCG Quant: 664 m[IU]/mL

## 2020-04-11 LAB — PROGESTERONE: Progesterone: 23.3 ng/mL

## 2020-04-24 ENCOUNTER — Other Ambulatory Visit: Payer: Self-pay | Admitting: Adult Health

## 2020-04-24 MED ORDER — DOXYLAMINE-PYRIDOXINE 10-10 MG PO TBEC: DELAYED_RELEASE_TABLET | ORAL | 3 refills | Status: DC

## 2020-04-24 MED FILL — DOXYLAMINE-PYRIDOXINE 10-10: 10-10 | 30 days supply | Qty: 120 | Fill #0

## 2020-04-24 NOTE — Progress Notes (Signed)
Will rx diclegis

## 2020-04-27 ENCOUNTER — Other Ambulatory Visit: Payer: Self-pay | Admitting: Obstetrics & Gynecology

## 2020-04-27 DIAGNOSIS — O3680X0 Pregnancy with inconclusive fetal viability, not applicable or unspecified: Secondary | ICD-10-CM

## 2020-04-28 ENCOUNTER — Ambulatory Visit (INDEPENDENT_AMBULATORY_CARE_PROVIDER_SITE_OTHER): Payer: No Typology Code available for payment source

## 2020-04-28 ENCOUNTER — Other Ambulatory Visit: Payer: Self-pay

## 2020-04-28 ENCOUNTER — Other Ambulatory Visit: Payer: Self-pay | Admitting: Obstetrics & Gynecology

## 2020-04-28 DIAGNOSIS — O3680X0 Pregnancy with inconclusive fetal viability, not applicable or unspecified: Secondary | ICD-10-CM | POA: Diagnosis not present

## 2020-04-28 DIAGNOSIS — Z3A01 Less than 8 weeks gestation of pregnancy: Secondary | ICD-10-CM | POA: Diagnosis not present

## 2020-04-28 NOTE — Progress Notes (Signed)
US TV 6+6 wks,single IUP with YS,positive fht 120 bpm,normal ovaries,crl 9.01 mm  Chaperone Elmyra Ricks

## 2020-05-20 NOTE — L&D Delivery Note (Addendum)
Delivery Note Called to bedside and baby on maternal chest with umbilical cord still attached. At 4:44 AM a viable female was delivered. APGAR:9,9; weight 2780g.   Placenta status: spontaneous, intact.  Cord: 3v with the following complications: none.   Anesthesia: none Episiotomy: none Lacerations: none Est. Blood Loss (mL): 365mL  Mom to postpartum.  Baby to Couplet care / Skin to Skin.  Arrie Senate 0/75/7322, 5:11 AM

## 2020-05-29 MED FILL — DOXYLAMINE-PYRIDOXINE 10-10: 10-10 | 30 days supply | Qty: 120 | Fill #1

## 2020-06-02 ENCOUNTER — Other Ambulatory Visit: Payer: No Typology Code available for payment source

## 2020-06-02 ENCOUNTER — Other Ambulatory Visit: Payer: Self-pay

## 2020-06-02 NOTE — Progress Notes (Signed)
   NURSE VISIT- [facility]  SUBJECTIVE:  Brianna Griffin is a 35 y.o. [phone] female here for Panorama NIPT and Horizon Carrier Screening . She is [redacted]w[redacted]d pregnant.   OBJECTIVE:  Appears well, in no apparent distress  Blood work drawn from right [facility] without difficulty. 1 attempt(s).   ASSESSMENT: Pregnancy [redacted]w[redacted]d Panorama NIPT and Horizon Carrier Screening  PLAN: [facility] portal information given and instructed patient how to access results Follow-up as scheduled  Alice Rieger  06/02/2020 12:25 PM

## 2020-06-05 ENCOUNTER — Encounter: Payer: No Typology Code available for payment source | Admitting: Women's Health

## 2020-06-05 ENCOUNTER — Ambulatory Visit: Payer: No Typology Code available for payment source | Admitting: *Deleted

## 2020-06-05 ENCOUNTER — Other Ambulatory Visit: Payer: No Typology Code available for payment source

## 2020-06-06 ENCOUNTER — Other Ambulatory Visit: Payer: Self-pay | Admitting: Obstetrics & Gynecology

## 2020-06-06 DIAGNOSIS — Z3682 Encounter for antenatal screening for nuchal translucency: Secondary | ICD-10-CM

## 2020-06-07 ENCOUNTER — Ambulatory Visit (INDEPENDENT_AMBULATORY_CARE_PROVIDER_SITE_OTHER): Payer: No Typology Code available for payment source

## 2020-06-07 ENCOUNTER — Other Ambulatory Visit: Payer: No Typology Code available for payment source

## 2020-06-07 ENCOUNTER — Other Ambulatory Visit: Payer: Self-pay

## 2020-06-07 DIAGNOSIS — Z3682 Encounter for antenatal screening for nuchal translucency: Secondary | ICD-10-CM

## 2020-06-07 NOTE — Progress Notes (Signed)
[place] 12+4 wks,measurements c/w dates,crl 65.18 mm,fhr 152 bpm,fundal placenta,limited ultrasound,unable to obtain NT because of retroverted uterus,pt will come back next week for f/u ultrasound

## 2020-06-08 ENCOUNTER — Ambulatory Visit: Payer: No Typology Code available for payment source | Admitting: *Deleted

## 2020-06-08 ENCOUNTER — Ambulatory Visit (INDEPENDENT_AMBULATORY_CARE_PROVIDER_SITE_OTHER): Payer: No Typology Code available for payment source | Admitting: Women's Health

## 2020-06-08 ENCOUNTER — Encounter: Payer: Self-pay | Admitting: Women's Health

## 2020-06-08 VITALS — BP 124/80 | HR 95 | Wt 165.0 lb

## 2020-06-08 DIAGNOSIS — Z3A12 12 weeks gestation of pregnancy: Secondary | ICD-10-CM

## 2020-06-08 DIAGNOSIS — Z3480 Encounter for supervision of other normal pregnancy, unspecified trimester: Secondary | ICD-10-CM

## 2020-06-08 DIAGNOSIS — Z8639 Personal history of other endocrine, nutritional and metabolic disease: Secondary | ICD-10-CM

## 2020-06-08 DIAGNOSIS — Z3490 Encounter for supervision of normal pregnancy, unspecified, unspecified trimester: Secondary | ICD-10-CM | POA: Insufficient documentation

## 2020-06-08 DIAGNOSIS — Z3481 Encounter for supervision of other normal pregnancy, first trimester: Secondary | ICD-10-CM

## 2020-06-08 DIAGNOSIS — Z331 Pregnant state, incidental: Secondary | ICD-10-CM

## 2020-06-08 DIAGNOSIS — Z1389 Encounter for screening for other disorder: Secondary | ICD-10-CM

## 2020-06-08 MED FILL — ESCITALOPRAM 20 MG TABLET: 20 | 90 days supply | Qty: 90 | Fill #0

## 2020-06-08 MED FILL — buPROPion HCL ER (XL) 300 M: 300 | 90 days supply | Qty: 90 | Fill #0

## 2020-06-08 NOTE — Progress Notes (Signed)
INITIAL OBSTETRICAL VISIT Patient name: Brianna Griffin MRN [id number]  Date of birth: 11/07/1985 Chief Complaint:   Initial Prenatal Visit  History of Present Illness:   KYLEIGHA MARKERT is a 35 y.o. [id number] Caucasian female at [redacted]w[redacted]d by LMP c/w u/s at 6 weeks with an Estimated Date of Delivery: 12/16/20 being seen today for her initial obstetrical visit.   Her obstetrical history is significant for term uncomplicated SVB x 2, SAB x 3.   Today she reports some nausea- meds help.  Dep/anx-doing well on wellbutrin and lexapro H/O transient HTN ~April 2021 on meds for short period, stopped in July- bp's normal since Depression screen [facility] 2/9 06/08/2020 12/27/2019 12/13/2019 09/01/2019 07/23/2019  Decreased Interest 0 0 0 0 0  Down, Depressed, Hopeless 0 0 0 0 0  PHQ - 2 Score 0 0 0 0 0  Altered sleeping 0 0 0 0 -  Tired, decreased energy 2 1 0 1 -  Change in appetite 2 1 1  0 -  Feeling bad or failure about yourself  - 0 0 0 -  Trouble concentrating - 0 0 0 -  Moving slowly or fidgety/restless - 0 0 0 -  Suicidal thoughts - 0 0 0 -  PHQ-9 Score 4 2 1 1  -  Difficult doing work/chores - Not difficult at all Not difficult at all Not difficult at all -    Patient's last menstrual period was 03/11/2020 (exact date). Last pap 06/24/18. Results were: normal Review of Systems:   Pertinent items are noted in HPI Denies cramping/contractions, leakage of fluid, vaginal bleeding, abnormal vaginal discharge w/ itching/odor/irritation, headaches, visual changes, shortness of breath, chest pain, abdominal pain, severe nausea/vomiting, or problems with urination or bowel movements unless otherwise stated above.  Pertinent History Reviewed:  Reviewed past medical,surgical, social, obstetrical and family history.  Reviewed problem list, medications and allergies. OB History  Gravida Para Term Preterm AB Living  6 2 2   3 2   SAB IAB Ectopic Multiple Live Births  3     0 2    # Outcome Date GA Lbr Len/2nd  Weight Sex Delivery Anes PTL Lv  6 Current           5 SAB 02/2020          4 Term 09/01/15 [redacted]w[redacted]d 13:50 / 00:08 7 lb 7.4 oz (3.385 kg) M Vag-Spont EPI N LIV  3 SAB 02/2014          2 SAB 08/2013 [redacted]w[redacted]d         1 Term 04/12/09 [redacted]w[redacted]d  5 lb 15 oz (2.693 kg) F Vag-Spont None N LIV   Physical Assessment:   Vitals:   06/08/20 1027  BP: 124/80  Pulse: 95  Weight: 165 lb (74.8 kg)  Body mass index is 28.32 kg/m.       Physical Examination:  General appearance - well appearing, and in no distress  Mental status - alert, oriented to person, place, and time  Psych:  She has a normal mood and affect  Skin - warm and dry, normal color, no suspicious lesions noted  Chest - effort normal, all lung fields clear to auscultation bilaterally  Heart - normal rate and regular rhythm  Abdomen - soft, nontender  Extremities:  No swelling or varicosities noted  Thin prep pap is not done   Chaperone: N/A    NT u/s yesterday: [place] 12+4 wks,measurements c/w dates,crl 65.18 mm,fhr 152 bpm,fundal placenta,limited ultrasound,unable to obtain NT because  of retroverted uterus,pt will come back next week for f/u ultrasound   No results found for this or any previous visit (from the past 24 hour(s)).  Assessment & Plan:  1) Low-Risk Pregnancy [id number] at [redacted]w[redacted]d with an Estimated Date of Delivery: 12/16/20   2) Initial OB visit  3) Dep/anx>doing well on wellbutrin and lexapro, ok to continue  4) H/O transient HTN last year> no meds since July and bp's have been normal. Will get baseline labs. Can't take asa d/t nsaid allergy. Check bp's weekly at home, let us know if >140/90  Meds: No orders of the defined types were placed in this encounter.   Initial labs obtained Continue prenatal vitamins Reviewed n/v relief measures and warning s/s to report Reviewed recommended weight gain based on pre-gravid BMI Encouraged well-balanced diet Genetic & carrier screening discussed: requests Panorama, NT/IT and Horizon  14  Ultrasound discussed; fetal survey: requested [place] completed> form faxed if has or is planning to apply for medicaid The nature of [facility] for [employer] with multiple MDs and other Advanced Practice Providers was explained to patient; also emphasized that fellows, residents, and students are part of our team. Has home bp cuff. Check bp weekly, let us know if >140/90.   Follow-up: Return for next wed as scheduled for u/s & labs (no visit), then 3wks from now for [facility] w/ CNM , in person.   Orders Placed This Encounter  Procedures  . Urine Culture  . GC/Chlamydia Probe Amp  . Genetic Screening  . Pain Management Screening Profile (10S)  . CBC/D/Plt+RPR+Rh+ABO+Rub Ab...  . Protein / creatinine ratio, urine  . Comprehensive metabolic panel  . TSH  . POC Urinalysis Dipstick OB    [name] CNM, [facility] 06/08/2020 11:09 AM

## 2020-06-08 NOTE — Patient Instructions (Signed)
Coral Spikes, I greatly value your feedback.  If you receive a survey following your visit with Brianna Griffin today, we appreciate you taking the time to fill it out.  Thanks, Knute Neu, CNM, WHNP-BC   [facility] [place] [facility] ([place], [place] [address]) Entrance C, located off of [address] parking   Nausea & Vomiting  Have saltine crackers or pretzels by your bed and eat a few bites before you raise your head out of bed in the morning  Eat small frequent meals throughout the day instead of large meals  Drink plenty of fluids throughout the day to stay hydrated, just don't drink a lot of fluids with your meals.  This can make your stomach fill up faster making you feel sick  Do not brush your teeth right after you eat  Products with real ginger are good for nausea, like ginger ale and ginger hard candy Make sure it says made with real ginger!  Sucking on sour candy like lemon heads is also good for nausea  If your prenatal vitamins make you nauseated, take them at night so you will sleep through the nausea  Sea Bands  If you feel like you need medicine for the nausea & vomiting please let Brianna Griffin know  If you are unable to keep any fluids or food down please let Brianna Griffin know   Constipation  Drink plenty of fluid, preferably water, throughout the day  Eat foods high in fiber such as fruits, vegetables, and grains  Exercise, such as walking, is a good way to keep your bowels regular  Drink warm fluids, especially warm prune juice, or decaf coffee  Eat a 1/2 cup of real oatmeal (not instant), 1/2 cup applesauce, and 1/2-1 cup warm prune juice every day  If needed, you may take Colace (docusate sodium) stool softener once or twice a day to help keep the stool soft.   If you still are having problems with constipation, you may take Miralax once daily as needed to help keep your bowels regular.   Home Blood Pressure Monitoring for Patients    Your provider has recommended that you check your blood pressure (BP) at least once a week at home. If you do not have a blood pressure cuff at home, one will be provided for you. Contact your provider if you have not received your monitor within 1 week.   Helpful Tips for Accurate Home Blood Pressure Checks  . Don't smoke, exercise, or drink caffeine 30 minutes before checking your BP . Use the restroom before checking your BP (a full bladder can raise your pressure) . Relax in a comfortable upright chair . Feet on the ground . Left arm resting comfortably on a flat surface at the level of your heart . Legs uncrossed . Back supported . Sit quietly and don't talk . Place the cuff on your bare arm . Adjust snuggly, so that only two fingertips can fit between your skin and the top of the cuff . Check 2 readings separated by at least one minute . Keep a log of your BP readings . For a visual, please reference this diagram: http://ccnc.care/bpdiagram  Provider Name: [facility]     Phone: [phone]  Zone 1: ALL CLEAR  Continue to monitor your symptoms:  . BP reading is less than 140 (top number) or less than 90 (bottom number)  . No right upper stomach pain . No headaches or  seeing spots . No feeling nauseated or throwing up . No swelling in face and hands  Zone 2: CAUTION Call your doctor's office for any of the following:  . BP reading is greater than 140 (top number) or greater than 90 (bottom number)  . Stomach pain under your ribs in the middle or right side . Headaches or seeing spots . Feeling nauseated or throwing up . Swelling in face and hands  Zone 3: EMERGENCY  Seek immediate medical care if you have any of the following:  . BP reading is greater than160 (top number) or greater than 110 (bottom number) . Severe headaches not improving with Tylenol . Serious difficulty catching your breath . Any worsening symptoms from Zone 2    First Trimester of  Pregnancy The first trimester of pregnancy is from week 1 until the end of week 12 (months 1 through 3). A week after a sperm fertilizes an egg, the egg will implant on the wall of the uterus. This embryo will begin to develop into a baby. Genes from you and your partner are forming the baby. The female genes determine whether the baby is a boy or a girl. At 6-8 weeks, the eyes and face are formed, and the heartbeat can be seen on ultrasound. At the end of 12 weeks, all the baby's organs are formed.  Now that you are pregnant, you will want to do everything you can to have a healthy baby. Two of the most important things are to get good prenatal care and to follow your health care provider's instructions. Prenatal care is all the medical care you receive before the baby's birth. This care will help prevent, find, and treat any problems during the pregnancy and childbirth. BODY CHANGES Your body goes through many changes during pregnancy. The changes vary from woman to woman.   You may gain or lose a couple of pounds at first.  You may feel sick to your stomach (nauseous) and throw up (vomit). If the vomiting is uncontrollable, call your health care provider.  You may tire easily.  You may develop headaches that can be relieved by medicines approved by your health care provider.  You may urinate more often. Painful urination may mean you have a bladder infection.  You may develop heartburn as a result of your pregnancy.  You may develop constipation because certain hormones are causing the muscles that push waste through your intestines to slow down.  You may develop hemorrhoids or swollen, bulging veins (varicose veins).  Your breasts may begin to grow larger and become tender. Your nipples may stick out more, and the tissue that surrounds them (areola) may become darker.  Your gums may bleed and may be sensitive to brushing and flossing.  Dark spots or blotches (chloasma, mask of pregnancy)  may develop on your face. This will likely fade after the baby is born.  Your menstrual periods will stop.  You may have a loss of appetite.  You may develop cravings for certain kinds of food.  You may have changes in your emotions from day to day, such as being excited to be pregnant or being concerned that something may go wrong with the pregnancy and baby.  You may have more vivid and strange dreams.  You may have changes in your hair. These can include thickening of your hair, rapid growth, and changes in texture. Some women also have hair loss during or after pregnancy, or hair that feels dry or thin. Your  hair will most likely return to normal after your baby is born. WHAT TO EXPECT AT YOUR PRENATAL VISITS During a routine prenatal visit:  You will be weighed to make sure you and the baby are growing normally.  Your blood pressure will be taken.  Your abdomen will be measured to track your baby's growth.  The fetal heartbeat will be listened to starting around week 10 or 12 of your pregnancy.  Test results from any previous visits will be discussed. Your health care provider may ask you:  How you are feeling.  If you are feeling the baby move.  If you have had any abnormal symptoms, such as leaking fluid, bleeding, severe headaches, or abdominal cramping.  If you have any questions. Other tests that may be performed during your first trimester include:  Blood tests to find your blood type and to check for the presence of any previous infections. They will also be used to check for low iron levels (anemia) and Rh antibodies. Later in the pregnancy, blood tests for diabetes will be done along with other tests if problems develop.  Urine tests to check for infections, diabetes, or protein in the urine.  An ultrasound to confirm the proper growth and development of the baby.  An amniocentesis to check for possible genetic problems.  Fetal screens for spina bifida and  Down syndrome.  You may need other tests to make sure you and the baby are doing well. HOME CARE INSTRUCTIONS  Medicines  Follow your health care provider's instructions regarding medicine use. Specific medicines may be either safe or unsafe to take during pregnancy.  Take your prenatal vitamins as directed.  If you develop constipation, try taking a stool softener if your health care provider approves. Diet  Eat regular, well-balanced meals. Choose a variety of foods, such as meat or vegetable-based protein, fish, milk and low-fat dairy products, vegetables, fruits, and whole grain breads and cereals. Your health care provider will help you determine the amount of weight gain that is right for you.  Avoid raw meat and uncooked cheese. These carry germs that can cause birth defects in the baby.  Eating four or five small meals rather than three large meals a day may help relieve nausea and vomiting. If you start to feel nauseous, eating a few soda crackers can be helpful. Drinking liquids between meals instead of during meals also seems to help nausea and vomiting.  If you develop constipation, eat more high-fiber foods, such as fresh vegetables or fruit and whole grains. Drink enough fluids to keep your urine clear or pale yellow. Activity and Exercise  Exercise only as directed by your health care provider. Exercising will help you:  Control your weight.  Stay in shape.  Be prepared for labor and delivery.  Experiencing pain or cramping in the lower abdomen or low back is a good sign that you should stop exercising. Check with your health care provider before continuing normal exercises.  Try to avoid standing for long periods of time. Move your legs often if you must stand in one place for a long time.  Avoid heavy lifting.  Wear low-heeled shoes, and practice good posture.  You may continue to have sex unless your health care provider directs you otherwise. Relief of Pain  or Discomfort  Wear a good support bra for breast tenderness.    Take warm sitz baths to soothe any pain or discomfort caused by hemorrhoids. Use hemorrhoid cream if your health care provider  approves.    Rest with your legs elevated if you have leg cramps or low back pain.  If you develop varicose veins in your legs, wear support hose. Elevate your feet for 15 minutes, 3-4 times a day. Limit salt in your diet. Prenatal Care  Schedule your prenatal visits by the twelfth week of pregnancy. They are usually scheduled monthly at first, then more often in the last 2 months before delivery.  Write down your questions. Take them to your prenatal visits.  Keep all your prenatal visits as directed by your health care provider. Safety  Wear your seat belt at all times when driving.  Make a list of emergency phone numbers, including numbers for family, friends, the hospital, and police and fire departments. General Tips  Ask your health care provider for a referral to a local prenatal education class. Begin classes no later than at the beginning of month 6 of your pregnancy.  Ask for help if you have counseling or nutritional needs during pregnancy. Your health care provider can offer advice or refer you to specialists for help with various needs.  Do not use hot tubs, steam rooms, or saunas.  Do not douche or use tampons or scented sanitary pads.  Do not cross your legs for long periods of time.  Avoid cat litter boxes and soil used by cats. These carry germs that can cause birth defects in the baby and possibly loss of the fetus by miscarriage or stillbirth.  Avoid all smoking, herbs, alcohol, and medicines not prescribed by your health care provider. Chemicals in these affect the formation and growth of the baby.  Schedule a dentist appointment. At home, brush your teeth with a soft toothbrush and be gentle when you floss. SEEK MEDICAL CARE IF:   You have dizziness.  You have mild  pelvic cramps, pelvic pressure, or nagging pain in the abdominal area.  You have persistent nausea, vomiting, or diarrhea.  You have a bad smelling vaginal discharge.  You have pain with urination.  You notice increased swelling in your face, hands, legs, or ankles. SEEK IMMEDIATE MEDICAL CARE IF:   You have a fever.  You are leaking fluid from your vagina.  You have spotting or bleeding from your vagina.  You have severe abdominal cramping or pain.  You have rapid weight gain or loss.  You vomit blood or material that looks like coffee grounds.  You are exposed to Brianna Griffin measles and have never had them.  You are exposed to fifth disease or chickenpox.  You develop a severe headache.  You have shortness of breath.  You have any kind of trauma, such as from a fall or a car accident. Document Released: 04/30/2001 Document Revised: 09/20/2013 Document Reviewed: 03/16/2013 [facility] Patient Information 2015 [place], [place]. This information is not intended to replace advice given to you by your health care provider. Make sure you discuss any questions you have with your health care provider.

## 2020-06-10 LAB — PMP SCREEN PROFILE (10S), URINE
Amphetamine Scrn, Ur: NEGATIVE ng/mL
BARBITURATE SCREEN URINE: NEGATIVE ng/mL
BENZODIAZEPINE SCREEN, URINE: NEGATIVE ng/mL
CANNABINOIDS UR QL SCN: NEGATIVE ng/mL
Cocaine (Metab) Scrn, Ur: NEGATIVE ng/mL
Creatinine(Crt), U: 73.3 mg/dL (ref 20.0–300.0)
Methadone Screen, Urine: NEGATIVE ng/mL
OXYCODONE+OXYMORPHONE UR QL SCN: NEGATIVE ng/mL
Opiate Scrn, Ur: NEGATIVE ng/mL
Ph of Urine: 7.4 (ref 4.5–8.9)
Phencyclidine Qn, Ur: NEGATIVE ng/mL
Propoxyphene Scrn, Ur: NEGATIVE ng/mL

## 2020-06-10 LAB — URINE CULTURE: Organism ID, Bacteria: NO GROWTH

## 2020-06-10 LAB — GC/CHLAMYDIA PROBE AMP
Chlamydia trachomatis, NAA: NEGATIVE
Neisseria Gonorrhoeae by PCR: NEGATIVE

## 2020-06-13 ENCOUNTER — Other Ambulatory Visit: Payer: Self-pay | Admitting: Obstetrics & Gynecology

## 2020-06-13 DIAGNOSIS — Z3682 Encounter for antenatal screening for nuchal translucency: Secondary | ICD-10-CM

## 2020-06-14 ENCOUNTER — Other Ambulatory Visit: Payer: No Typology Code available for payment source

## 2020-06-14 ENCOUNTER — Other Ambulatory Visit: Payer: Self-pay

## 2020-06-14 ENCOUNTER — Ambulatory Visit (INDEPENDENT_AMBULATORY_CARE_PROVIDER_SITE_OTHER): Payer: No Typology Code available for payment source

## 2020-06-14 DIAGNOSIS — Z3A13 13 weeks gestation of pregnancy: Secondary | ICD-10-CM

## 2020-06-14 DIAGNOSIS — Z3682 Encounter for antenatal screening for nuchal translucency: Secondary | ICD-10-CM

## 2020-06-14 DIAGNOSIS — Z3480 Encounter for supervision of other normal pregnancy, unspecified trimester: Secondary | ICD-10-CM

## 2020-06-14 NOTE — Progress Notes (Signed)
[place] 13+4 wks,measurements c/w dates,crl 74.95 mm,NB present,NT 1.5 mm,fhr 148 bpm,anterior placenta

## 2020-06-15 LAB — CBC/D/PLT+RPR+RH+ABO+RUB AB...
Antibody Screen: NEGATIVE
Basophils Absolute: 0 10*3/uL (ref 0.0–0.2)
Basos: 0 %
EOS (ABSOLUTE): 0.1 10*3/uL (ref 0.0–0.4)
Eos: 1 %
HCV Ab: <0.1 s/co ratio (ref 0.0–0.9)
HIV Screen 4th Generation wRfx: NONREACTIVE
Hematocrit: 35.6 % (ref 34.0–46.6)
Hemoglobin: 12.7 g/dL (ref 11.1–15.9)
Hepatitis B Surface Ag: NEGATIVE
Immature Grans (Abs): 0.1 10*3/uL (ref 0.0–0.1)
Immature Granulocytes: 2 %
Lymphocytes Absolute: 1.9 10*3/uL (ref 0.7–3.1)
Lymphs: 21 %
MCH: 32.6 pg (ref 26.6–33.0)
MCHC: 35.7 g/dL (ref 31.5–35.7)
MCV: 91 fL (ref 79–97)
Monocytes Absolute: 0.5 10*3/uL (ref 0.1–0.9)
Monocytes: 5 %
Neutrophils Absolute: 6.2 10*3/uL (ref 1.4–7.0)
Neutrophils: 71 %
Platelets: 206 10*3/uL (ref 150–450)
RBC: 3.9 x10E6/uL (ref 3.77–5.28)
RDW: 12.4 % (ref 11.7–15.4)
RPR Ser Ql: NONREACTIVE
Rh Factor: POSITIVE
Rubella Antibodies, IGG: 1.34 index (ref >0.99)
WBC: 8.8 10*3/uL (ref 3.4–10.8)

## 2020-06-15 LAB — TSH: TSH: 2.85 u[IU]/mL (ref 0.450–4.500)

## 2020-06-15 LAB — COMPREHENSIVE METABOLIC PANEL
ALT: 19 IU/L (ref 0–32)
AST: 16 IU/L (ref 0–40)
Albumin/Globulin Ratio: 1.3 (ref 1.2–2.2)
Albumin: 4.2 g/dL (ref 3.8–4.8)
Alkaline Phosphatase: 70 IU/L (ref 44–121)
BUN/Creatinine Ratio: 12 (ref 9–23)
BUN: 7 mg/dL (ref 6–20)
Bilirubin Total: 0.4 mg/dL (ref 0.0–1.2)
CO2: 24 mmol/L (ref 20–29)
Calcium: 9.7 mg/dL (ref 8.7–10.2)
Chloride: 100 mmol/L (ref 96–106)
Creatinine, Ser: 0.6 mg/dL (ref 0.57–1.00)
GFR calc Af Amer: 138 mL/min/{1.73_m2} (ref >59)
GFR calc non Af Amer: 119 mL/min/{1.73_m2} (ref >59)
Globulin, Total: 3.2 g/dL (ref 1.5–4.5)
Glucose: 92 mg/dL (ref 65–99)
Potassium: 4.6 mmol/L (ref 3.5–5.2)
Sodium: 137 mmol/L (ref 134–144)
Total Protein: 7.4 g/dL (ref 6.0–8.5)

## 2020-06-15 LAB — PROTEIN / CREATININE RATIO, URINE
Creatinine, Urine: 61.3 mg/dL
Protein, Ur: 4.1 mg/dL
Protein/Creat Ratio: 67 mg/g creat (ref 0–200)

## 2020-06-15 LAB — HCV INTERPRETATION

## 2020-06-16 LAB — INTEGRATED 1
Crown Rump Length: 75 mm
Gest. Age on Collection Date: 13.3 weeks
Maternal Age at EDD: 34.9 yr
Nuchal Translucency (NT): 1.5 mm
Number of Fetuses: 1
PAPP-A Value: 993.4 ng/mL
Weight: 164 [lb_av]

## 2020-06-20 ENCOUNTER — Encounter: Payer: Self-pay | Admitting: Family Medicine

## 2020-06-20 ENCOUNTER — Ambulatory Visit: Payer: No Typology Code available for payment source | Admitting: Family Medicine

## 2020-06-29 ENCOUNTER — Encounter: Payer: Self-pay | Admitting: Family Medicine

## 2020-06-29 ENCOUNTER — Ambulatory Visit (INDEPENDENT_AMBULATORY_CARE_PROVIDER_SITE_OTHER): Payer: No Typology Code available for payment source | Admitting: Family Medicine

## 2020-06-29 ENCOUNTER — Ambulatory Visit (INDEPENDENT_AMBULATORY_CARE_PROVIDER_SITE_OTHER): Payer: No Typology Code available for payment source | Admitting: Advanced Practice Midwife

## 2020-06-29 ENCOUNTER — Other Ambulatory Visit: Payer: Self-pay

## 2020-06-29 ENCOUNTER — Other Ambulatory Visit: Payer: Self-pay | Admitting: Family Medicine

## 2020-06-29 VITALS — BP 119/77 | HR 93 | Temp 97.5°F | Ht 64.0 in | Wt 167.8 lb

## 2020-06-29 VITALS — BP 120/77 | HR 100 | Wt 167.0 lb

## 2020-06-29 DIAGNOSIS — Z3480 Encounter for supervision of other normal pregnancy, unspecified trimester: Secondary | ICD-10-CM

## 2020-06-29 DIAGNOSIS — F418 Other specified anxiety disorders: Secondary | ICD-10-CM | POA: Diagnosis not present

## 2020-06-29 DIAGNOSIS — Z3A16 16 weeks gestation of pregnancy: Secondary | ICD-10-CM

## 2020-06-29 DIAGNOSIS — Z3A15 15 weeks gestation of pregnancy: Secondary | ICD-10-CM

## 2020-06-29 MED ORDER — BUPROPION HCL ER (XL) 300 MG PO TB24: ORAL_TABLET | ORAL | 1 refills | Status: DC

## 2020-06-29 MED ORDER — ESCITALOPRAM OXALATE 20 MG PO TABS: ORAL_TABLET | ORAL | 1 refills | Status: DC

## 2020-06-29 NOTE — Progress Notes (Signed)
   LOW-RISK PREGNANCY VISIT Patient name: Brianna Griffin MRN [id number]  Date of birth: 1986-01-29 Chief Complaint:   Routine Prenatal Visit  History of Present Illness:   Brianna Griffin is a 35 y.o. [id number] female at [redacted]w[redacted]d with an Estimated Date of Delivery: 12/16/20 being seen today for ongoing management of a low-risk pregnancy.  Today she reports no complaints. Contractions: Not present. Vag. Bleeding: None.   . denies leaking of fluid. Review of Systems:   Pertinent items are noted in HPI Denies abnormal vaginal discharge w/ itching/odor/irritation, headaches, visual changes, shortness of breath, chest pain, abdominal pain, severe nausea/vomiting, or problems with urination or bowel movements unless otherwise stated above. Pertinent History Reviewed:  Reviewed past medical,surgical, social, obstetrical and family history.  Reviewed problem list, medications and allergies. Physical Assessment:   Vitals:   06/29/20 0835  BP: 120/77  Pulse: 100  Weight: 167 lb (75.8 kg)  Body mass index is 28.67 kg/m.        Physical Examination:   General appearance: Well appearing, and in no distress  Mental status: Alert, oriented to person, place, and time  Skin: Warm & dry  Cardiovascular: Normal heart rate noted  Respiratory: Normal respiratory effort, no distress  Abdomen: Soft, gravid, nontender  Pelvic: Cervical exam deferred         Extremities: Edema: None  Fetal Status:          Chaperone: n/a    No results found for this or any previous visit (from the past 24 hour(s)).  Assessment & Plan:  1) Low-risk pregnancy [id number] at [redacted]w[redacted]d with an Estimated Date of Delivery: 12/16/20      Meds: No orders of the defined types were placed in this encounter.  Labs/procedures today: none (Lab closed dt heating problem, will get 2nd IT next visit  Plan:  Continue routine obstetrical care  Next visit: prefers in person    Reviewed: Preterm labor symptoms and general obstetric  precautions including but not limited to vaginal bleeding, contractions, leaking of fluid and fetal movement were reviewed in detail with the patient.  All questions were answered. Has home bp cuff. Check bp weekly, let us know if >140/90.   Follow-up: Return in about 3 weeks (around 07/20/2020) for LROB, IZ:[id number].  No orders of the defined types were placed in this encounter.  Christin Fudge DNP, CNM 06/29/2020 9:03 AM

## 2020-06-29 NOTE — Progress Notes (Signed)
Patient ID: Brianna Griffin, female    DOB: 08-27-1985, 35 y.o.   MRN: [id number]   Chief Complaint  Patient presents with  . Depression  . pregnancy   Subjective:    HPI Pt here for follow up on anxiety and depression, newly pregnant. Pt taking Wellbutrin and Lexapro. No issues.     Not having too many concerns.  Using [facility] counseling starting that with virtual counselor. Having some coping with other 2 kids going through. Daughter-adhd, has epilepsy, cyclic vomiting; 39 yr old. Son-adhd-combined; with oppositional behavior. 35 yrs old Starting meds, quilivant.   Struggling with emotional part and seeing child psychiatry at [facility].  Pt stating gyn okay with wellbutrin and lexapro. Pregnancy 16 wks. This is her 3rd child.  Brianna Griffin is a boy. Had covid 2 x vaccines.  Not had booster yet. Due 12/16/20.  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   Outpatient Encounter Medications as of 06/29/2020  Medication Sig  . albuterol (VENTOLIN HFA) 108 (90 Base) MCG/ACT inhaler INHALE 2 PUFFS EVERY 4 HOURS AS NEEDED FOR WHEEZING.  . Bacillus Coagulans-Inulin (PROBIOTIC FORMULA PO) Take by mouth.  . Cetirizine HCl 10 MG CAPS Zyrtec prn  . Doxylamine-Pyridoxine 10-10 MG TBEC Take 2 at hs, 1 in am and 1 in afternoon  . hydrOXYzine (ATARAX/VISTARIL) 10 MG tablet TAKE ONE TABLET BY MOUTH EVERY 8 HOURS AS NEEDED FOR ANXIETY.  . Prenatal MV-Min-Fe Fum-FA-DHA (PRENATAL 1 PO) Take by mouth.  . [DISCONTINUED] buPROPion (WELLBUTRIN XL) 300 MG 24 hr tablet Take one tablet qhs  . [DISCONTINUED]  escitalopram (LEXAPRO) 20 MG tablet Take 1 tablet daily  . buPROPion (WELLBUTRIN XL) 300 MG 24 hr tablet Take one tablet qhs  . escitalopram (LEXAPRO) 20 MG tablet Take 1 tablet daily   No facility-administered encounter medications on file as of 06/29/2020.     Review of Systems  Constitutional: Negative for chills and fever.  HENT: Negative for congestion, rhinorrhea and sore throat.   Respiratory: Negative for cough, shortness of breath and wheezing.   Cardiovascular: Negative for chest pain and leg swelling.  Gastrointestinal: Negative for abdominal pain, diarrhea, nausea and vomiting.  Genitourinary: Negative for dysuria and frequency.  Musculoskeletal: Negative for arthralgias and back pain.  Skin: Negative for rash.  Neurological: Negative for dizziness, weakness and headaches.     Vitals BP 119/77   Pulse 93   Temp (!) 97.5 F (36.4 C)   Ht 5\' 4"  (1.626 m)   Wt 167 lb 12.8 oz (76.1 kg)   LMP 03/11/2020 (Exact Date)   SpO2 99%   BMI 28.80 kg/m   Objective:   Physical Exam Vitals and nursing note reviewed.  Constitutional:      Appearance: Normal appearance.  HENT:     Head: Normocephalic and atraumatic.     Nose: Nose normal.     Mouth/Throat:     Mouth: Mucous membranes are moist.     Pharynx: Oropharynx is clear.  Eyes:     Extraocular Movements: Extraocular movements intact.     Conjunctiva/sclera: Conjunctivae normal.     Pupils: Pupils are equal, round, and reactive to light.  Cardiovascular:     Rate and Rhythm: Normal rate and  regular rhythm.     Pulses: Normal pulses.     Heart sounds: Normal heart sounds.  Pulmonary:     Effort: Pulmonary effort is normal.     Breath sounds: Normal breath sounds. No wheezing, rhonchi or rales.  Musculoskeletal:        General: Normal range of motion.     Right lower leg: No edema.     Left lower leg: No edema.  Skin:    General: Skin is warm and dry.     Findings: No lesion or rash.  Neurological:      General: No focal deficit present.     Mental Status: She is alert and oriented to person, place, and time.  Psychiatric:        Mood and Affect: Mood normal.        Behavior: Behavior normal.        Thought Content: Thought content normal.        Judgment: Judgment normal.      Assessment and Plan   1. Depression with anxiety  2. [redacted] weeks gestation of pregnancy   Pt doing well on lexapro and wellbutrin.  Per her ob/gyn and pt- they stated they are ok with her proceeding with these medications during her pregnancy. Not taking hydrozyzine much, only 1x in last week.  [facility]- [facility].  F/u 26mo or prn.

## 2020-06-29 NOTE — Patient Instructions (Signed)
Coral Spikes, I greatly value your feedback.  If you receive a survey following your visit with [place] today, we appreciate you taking the time to fill it out.  Thanks, Nigel Berthold, CNM     [place]!!! It is now [place] at [facility] ([place], [place] [address]) Entrance located off of [address] parking   Go to [email or web address] to register for FREE online childbirth classes    Second Trimester of Pregnancy The second trimester is from week 14 through week 27 (months 4 through 6). The second trimester is often a time when you feel your best. Your body has adjusted to being pregnant, and you begin to feel better physically. Usually, morning sickness has lessened or quit completely, you may have more energy, and you may have an increase in appetite. The second trimester is also a time when the fetus is growing rapidly. At the end of the sixth month, the fetus is about 9 inches long and weighs about 1 pounds. You will likely begin to feel the baby move (quickening) between 16 and 20 weeks of pregnancy. Body changes during your second trimester Your body continues to go through many changes during your second trimester. The changes vary from woman to woman.  Your weight will continue to increase. You will notice your lower abdomen bulging out.  You may begin to get stretch marks on your hips, abdomen, and breasts.  You may develop headaches that can be relieved by medicines. The medicines should be approved by your health care provider.  You may urinate more often because the fetus is pressing on your bladder.  You may develop or continue to have heartburn as a result of your pregnancy.  You may develop constipation because certain hormones are causing the muscles that push waste through your intestines to slow down.  You may develop hemorrhoids or swollen, bulging veins (varicose veins).  You may have  back pain. This is caused by: ? Weight gain. ? Pregnancy hormones that are relaxing the joints in your pelvis. ? A shift in weight and the muscles that support your balance.  Your breasts will continue to grow and they will continue to become tender.  Your gums may bleed and may be sensitive to brushing and flossing.  Dark spots or blotches (chloasma, mask of pregnancy) may develop on your face. This will likely fade after the baby is born.  A dark line from your belly button to the pubic area (linea nigra) may appear. This will likely fade after the baby is born.  You may have changes in your hair. These can include thickening of your hair, rapid growth, and changes in texture. Some women also have hair loss during or after pregnancy, or hair that feels dry or thin. Your hair will most likely return to normal after your baby is born.  What to expect at prenatal visits During a routine prenatal visit:  You will be weighed to make sure you and the fetus are growing normally.  Your blood pressure will be taken.  Your abdomen will be measured to track your baby's growth.  The fetal heartbeat will be listened to.  Any test results from the previous visit will be discussed.  Your health care provider may ask you:  How you are feeling.  If you are feeling the baby move.  If you have had any abnormal symptoms, such as leaking fluid, bleeding, severe headaches, or  abdominal cramping.  If you are using any tobacco products, including cigarettes, chewing tobacco, and electronic cigarettes.  If you have any questions.  Other tests that may be performed during your second trimester include:  Blood tests that check for: ? Low iron levels (anemia). ? High blood sugar that affects pregnant women (gestational diabetes) between 24 and 28 weeks. ? Rh antibodies. This is to check for a protein on red blood cells (Rh factor).  Urine tests to check for infections, diabetes, or protein in  the urine.  An ultrasound to confirm the proper growth and development of the baby.  An amniocentesis to check for possible genetic problems.  Fetal screens for spina bifida and Down syndrome.  HIV (human immunodeficiency virus) testing. Routine prenatal testing includes screening for HIV, unless you choose not to have this test.  Follow these instructions at home: Medicines  Follow your health care provider's instructions regarding medicine use. Specific medicines may be either safe or unsafe to take during pregnancy.  Take a prenatal vitamin that contains at least 600 micrograms (mcg) of folic acid.  If you develop constipation, try taking a stool softener if your health care provider approves. Eating and drinking  Eat a balanced diet that includes fresh fruits and vegetables, whole grains, good sources of protein such as meat, eggs, or tofu, and low-fat dairy. Your health care provider will help you determine the amount of weight gain that is right for you.  Avoid raw meat and uncooked cheese. These carry germs that can cause birth defects in the baby.  If you have low calcium intake from food, talk to your health care provider about whether you should take a daily calcium supplement.  Limit foods that are high in fat and processed sugars, such as fried and sweet foods.  To prevent constipation: ? Drink enough fluid to keep your urine clear or pale yellow. ? Eat foods that are high in fiber, such as fresh fruits and vegetables, whole grains, and beans. Activity  Exercise only as directed by your health care provider. Most women can continue their usual exercise routine during pregnancy. Try to exercise for 30 minutes at least 5 days a week. Stop exercising if you experience uterine contractions.  Avoid heavy lifting, wear low heel shoes, and practice good posture.  A sexual relationship may be continued unless your health care provider directs you otherwise. Relieving pain  and discomfort  Wear a good support bra to prevent discomfort from breast tenderness.  Take warm sitz baths to soothe any pain or discomfort caused by hemorrhoids. Use hemorrhoid cream if your health care provider approves.  Rest with your legs elevated if you have leg cramps or low back pain.  If you develop varicose veins, wear support hose. Elevate your feet for 15 minutes, 3-4 times a day. Limit salt in your diet. Prenatal Care  Write down your questions. Take them to your prenatal visits.  Keep all your prenatal visits as told by your health care provider. This is important. Safety  Wear your seat belt at all times when driving.  Make a list of emergency phone numbers, including numbers for family, friends, the hospital, and police and fire departments. General instructions  Ask your health care provider for a referral to a local prenatal education class. Begin classes no later than the beginning of month 6 of your pregnancy.  Ask for help if you have counseling or nutritional needs during pregnancy. Your health care provider can offer advice or   refer you to specialists for help with various needs.  Do not use hot tubs, steam rooms, or saunas.  Do not douche or use tampons or scented sanitary pads.  Do not cross your legs for long periods of time.  Avoid cat litter boxes and soil used by cats. These carry germs that can cause birth defects in the baby and possibly loss of the fetus by miscarriage or stillbirth.  Avoid all smoking, herbs, alcohol, and unprescribed drugs. Chemicals in these products can affect the formation and growth of the baby.  Do not use any products that contain nicotine or tobacco, such as cigarettes and e-cigarettes. If you need help quitting, ask your health care provider.  Visit your dentist if you have not gone yet during your pregnancy. Use a soft toothbrush to brush your teeth and be gentle when you floss. Contact a health care provider  if:  You have dizziness.  You have mild pelvic cramps, pelvic pressure, or nagging pain in the abdominal area.  You have persistent nausea, vomiting, or diarrhea.  You have a bad smelling vaginal discharge.  You have pain when you urinate. Get help right away if:  You have a fever.  You are leaking fluid from your vagina.  You have spotting or bleeding from your vagina.  You have severe abdominal cramping or pain.  You have rapid weight gain or weight loss.  You have shortness of breath with chest pain.  You notice sudden or extreme swelling of your face, hands, ankles, feet, or legs.  You have not felt your baby move in over an hour.  You have severe headaches that do not go away when you take medicine.  You have vision changes. Summary  The second trimester is from week 14 through week 27 (months 4 through 6). It is also a time when the fetus is growing rapidly.  Your body goes through many changes during pregnancy. The changes vary from woman to woman.  Avoid all smoking, herbs, alcohol, and unprescribed drugs. These chemicals affect the formation and growth your baby.  Do not use any tobacco products, such as cigarettes, chewing tobacco, and e-cigarettes. If you need help quitting, ask your health care provider.  Contact your health care provider if you have any questions. Keep all prenatal visits as told by your health care provider. This is important. This information is not intended to replace advice given to you by your health care provider. Make sure you discuss any questions you have with your health care provider.

## 2020-07-13 MED FILL — DOXYLAMINE-PYRIDOXINE 10-10: 10-10 | 30 days supply | Qty: 120 | Fill #2

## 2020-07-25 ENCOUNTER — Other Ambulatory Visit: Payer: Self-pay | Admitting: Advanced Practice Midwife

## 2020-07-25 DIAGNOSIS — Z363 Encounter for antenatal screening for malformations: Secondary | ICD-10-CM

## 2020-07-26 ENCOUNTER — Other Ambulatory Visit: Payer: Self-pay

## 2020-07-26 ENCOUNTER — Ambulatory Visit (INDEPENDENT_AMBULATORY_CARE_PROVIDER_SITE_OTHER): Payer: No Typology Code available for payment source

## 2020-07-26 ENCOUNTER — Ambulatory Visit (INDEPENDENT_AMBULATORY_CARE_PROVIDER_SITE_OTHER): Payer: No Typology Code available for payment source | Admitting: Advanced Practice Midwife

## 2020-07-26 VITALS — BP 116/68 | HR 84 | Wt 168.4 lb

## 2020-07-26 DIAGNOSIS — Z3A19 19 weeks gestation of pregnancy: Secondary | ICD-10-CM

## 2020-07-26 DIAGNOSIS — Z363 Encounter for antenatal screening for malformations: Secondary | ICD-10-CM | POA: Diagnosis not present

## 2020-07-26 DIAGNOSIS — Z3480 Encounter for supervision of other normal pregnancy, unspecified trimester: Secondary | ICD-10-CM

## 2020-07-26 DIAGNOSIS — Z1379 Encounter for other screening for genetic and chromosomal anomalies: Secondary | ICD-10-CM

## 2020-07-26 NOTE — Progress Notes (Signed)
[place] 21+7 wks,cephalic,normal ovaries,anterior placenta gr 0,cx 3.7 cm,fhr 146 bpm,svp of fluid 5.1 cm,efw 306 g 50%,anatomy complete,no obvious abnormalities

## 2020-07-26 NOTE — Patient Instructions (Signed)
Brianna Griffin, I greatly value your feedback.  If you receive a survey following your visit with [place] today, we appreciate you taking the time to fill it out.  Thanks, Derrill Memo, [place] at [facility] ([place], [place] [address]) Entrance C, located off of [address] parking  Go to [email or web address] to register for FREE online childbirth classes  [place] Pediatricians/Family Doctors:  [facility] [phone]                 [place] [phone] (usually not accepting new patients unless you have family there already, you are always welcome to call and ask)       [facility] Department [phone]       [facility] Pediatricians/Family Doctors:   [facility]: [phone]  [facility]: [phone]  [facility]: [place] Doctors:   [facility]: [facility]: [place]:  [place]: [phone]    Home Blood Pressure Monitoring for Patients   Your provider has recommended that you check your blood pressure (BP) at least once a week at home. If you do not have a blood pressure cuff at home, one will be provided for you. Contact your provider if you have not received your monitor within 1 week.   Helpful Tips for Accurate Home Blood Pressure Checks  . Don't smoke, exercise, or drink caffeine 30 minutes before checking your BP . Use the restroom before checking your BP (a full bladder can raise your pressure) . Relax in a comfortable upright chair . Feet on the ground . Left arm resting comfortably on a flat surface at the level of your heart . Legs uncrossed . Back supported . Sit quietly and don't talk . Place the cuff on your bare arm . Adjust snuggly, so that only  two fingertips can fit between your skin and the top of the cuff . Check 2 readings separated by at least one minute . Keep a log of your BP readings . For a visual, please reference this diagram: http://ccnc.care/bpdiagram  Provider Name: [facility]     Phone: [phone]  Zone 1: ALL CLEAR  Continue to monitor your symptoms:  . BP reading is less than 140 (top number) or less than 90 (bottom number)  . No right upper stomach pain . No headaches or seeing spots . No feeling nauseated or throwing up . No swelling in face and hands  Zone 2: CAUTION Call your doctor's office for any of the following:  . BP reading is greater than 140 (top number) or greater than 90 (bottom number)  . Stomach pain under your ribs in the middle or right side . Headaches or seeing spots . Feeling nauseated or throwing up . Swelling in face and hands  Zone 3: EMERGENCY  Seek immediate medical care if you have any of the following:  . BP reading is greater than160 (top number) or greater than 110 (bottom number) . Severe headaches not improving with Tylenol . Serious difficulty catching your breath . Any worsening symptoms from Zone 2     Second Trimester of Pregnancy The second trimester is from week 14 through week 27 (months 4 through 6). The second trimester is often a time when you feel your best.  Your body has adjusted to being pregnant, and you begin to feel better physically. Usually, morning sickness has lessened or quit completely, you may have more energy, and you may have an increase in appetite. The second trimester is also a time when the fetus is growing rapidly. At the end of the sixth month, the fetus is about 9 inches long and weighs about 1 pounds. You will likely begin to feel the baby move (quickening) between 16 and 20 weeks of pregnancy. Body changes during your second trimester Your body continues to go through many changes during your second trimester. The changes vary  from woman to woman.  Your weight will continue to increase. You will notice your lower abdomen bulging out.  You may begin to get stretch marks on your hips, abdomen, and breasts.  You may develop headaches that can be relieved by medicines. The medicines should be approved by your health care provider.  You may urinate more often because the fetus is pressing on your bladder.  You may develop or continue to have heartburn as a result of your pregnancy.  You may develop constipation because certain hormones are causing the muscles that push waste through your intestines to slow down.  You may develop hemorrhoids or swollen, bulging veins (varicose veins).  You may have back pain. This is caused by: ? Weight gain. ? Pregnancy hormones that are relaxing the joints in your pelvis. ? A shift in weight and the muscles that support your balance.  Your breasts will continue to grow and they will continue to become tender.  Your gums may bleed and may be sensitive to brushing and flossing.  Dark spots or blotches (chloasma, mask of pregnancy) may develop on your face. This will likely fade after the baby is born.  A dark line from your belly button to the pubic area (linea nigra) may appear. This will likely fade after the baby is born.  You may have changes in your hair. These can include thickening of your hair, rapid growth, and changes in texture. Some women also have hair loss during or after pregnancy, or hair that feels dry or thin. Your hair will most likely return to normal after your baby is born.  What to expect at prenatal visits During a routine prenatal visit:  You will be weighed to make sure you and the fetus are growing normally.  Your blood pressure will be taken.  Your abdomen will be measured to track your baby's growth.  The fetal heartbeat will be listened to.  Any test results from the previous visit will be discussed.  Your health care provider may ask  you:  How you are feeling.  If you are feeling the baby move.  If you have had any abnormal symptoms, such as leaking fluid, bleeding, severe headaches, or abdominal cramping.  If you are using any tobacco products, including cigarettes, chewing tobacco, and electronic cigarettes.  If you have any questions.  Other tests that may be performed during your second trimester include:  Blood tests that check for: ? Low iron levels (anemia). ? High blood sugar that affects pregnant women (gestational diabetes) between 14 and 28 weeks. ? Rh antibodies. This is to check for a protein on red blood cells (Rh factor).  Urine tests to check for infections, diabetes, or protein in the urine.  An ultrasound to confirm the proper growth and development of the baby.  An amniocentesis to check for possible genetic problems.  Fetal screens  for spina bifida and Down syndrome.  HIV (human immunodeficiency virus) testing. Routine prenatal testing includes screening for HIV, unless you choose not to have this test.  Follow these instructions at home: Medicines  Follow your health care provider's instructions regarding medicine use. Specific medicines may be either safe or unsafe to take during pregnancy.  Take a prenatal vitamin that contains at least 600 micrograms (mcg) of folic acid.  If you develop constipation, try taking a stool softener if your health care provider approves. Eating and drinking  Eat a balanced diet that includes fresh fruits and vegetables, whole grains, good sources of protein such as meat, eggs, or tofu, and low-fat dairy. Your health care provider will help you determine the amount of weight gain that is right for you.  Avoid raw meat and uncooked cheese. These carry germs that can cause birth defects in the baby.  If you have low calcium intake from food, talk to your health care provider about whether you should take a daily calcium supplement.  Limit foods that  are high in fat and processed sugars, such as fried and sweet foods.  To prevent constipation: ? Drink enough fluid to keep your urine clear or pale yellow. ? Eat foods that are high in fiber, such as fresh fruits and vegetables, whole grains, and beans. Activity  Exercise only as directed by your health care provider. Most women can continue their usual exercise routine during pregnancy. Try to exercise for 30 minutes at least 5 days a week. Stop exercising if you experience uterine contractions.  Avoid heavy lifting, wear low heel shoes, and practice good posture.  A sexual relationship may be continued unless your health care provider directs you otherwise. Relieving pain and discomfort  Wear a good support bra to prevent discomfort from breast tenderness.  Take warm sitz baths to soothe any pain or discomfort caused by hemorrhoids. Use hemorrhoid cream if your health care provider approves.  Rest with your legs elevated if you have leg cramps or low back pain.  If you develop varicose veins, wear support hose. Elevate your feet for 15 minutes, 3-4 times a day. Limit salt in your diet. Prenatal Care  Write down your questions. Take them to your prenatal visits.  Keep all your prenatal visits as told by your health care provider. This is important. Safety  Wear your seat belt at all times when driving.  Make a list of emergency phone numbers, including numbers for family, friends, the hospital, and police and fire departments. General instructions  Ask your health care provider for a referral to a local prenatal education class. Begin classes no later than the beginning of month 6 of your pregnancy.  Ask for help if you have counseling or nutritional needs during pregnancy. Your health care provider can offer advice or refer you to specialists for help with various needs.  Do not use hot tubs, steam rooms, or saunas.  Do not douche or use tampons or scented sanitary  pads.  Do not cross your legs for long periods of time.  Avoid cat litter boxes and soil used by cats. These carry germs that can cause birth defects in the baby and possibly loss of the fetus by miscarriage or stillbirth.  Avoid all smoking, herbs, alcohol, and unprescribed drugs. Chemicals in these products can affect the formation and growth of the baby.  Do not use any products that contain nicotine or tobacco, such as cigarettes and e-cigarettes. If you need help quitting,  ask your health care provider.  Visit your dentist if you have not gone yet during your pregnancy. Use a soft toothbrush to brush your teeth and be gentle when you floss. Contact a health care provider if:  You have dizziness.  You have mild pelvic cramps, pelvic pressure, or nagging pain in the abdominal area.  You have persistent nausea, vomiting, or diarrhea.  You have a bad smelling vaginal discharge.  You have pain when you urinate. Get help right away if:  You have a fever.  You are leaking fluid from your vagina.  You have spotting or bleeding from your vagina.  You have severe abdominal cramping or pain.  You have rapid weight gain or weight loss.  You have shortness of breath with chest pain.  You notice sudden or extreme swelling of your face, hands, ankles, feet, or legs.  You have not felt your baby move in over an hour.  You have severe headaches that do not go away when you take medicine.  You have vision changes. Summary  The second trimester is from week 14 through week 27 (months 4 through 6). It is also a time when the fetus is growing rapidly.  Your body goes through many changes during pregnancy. The changes vary from woman to woman.  Avoid all smoking, herbs, alcohol, and unprescribed drugs. These chemicals affect the formation and growth your baby.  Do not use any tobacco products, such as cigarettes, chewing tobacco, and e-cigarettes. If you need help quitting, ask your  health care provider.  Contact your health care provider if you have any questions. Keep all prenatal visits as told by your health care provider. This is important. This information is not intended to replace advice given to you by your health care provider. Make sure you discuss any questions you have with your health care provider. Document Released: 04/30/2001 Document Revised: 10/12/2015 Document Reviewed: 07/07/2012 Elsevier Interactive Patient Education  2017 [employer].

## 2020-07-26 NOTE — Progress Notes (Addendum)
   LOW-RISK PREGNANCY VISIT Patient name: Brianna Griffin MRN [id number]  Date of birth: 11-25-1985 Chief Complaint:   Routine Prenatal Visit and Pregnancy Ultrasound  History of Present Illness:   Brianna Griffin is a 35 y.o. [id number] female at [redacted]w[redacted]d with an Estimated Date of Delivery: 12/16/20 being seen today for ongoing management of a low-risk pregnancy.  Today she reports doing well with Wellbutrin & Lexapro- saw Dr Lovena Le recently and med doses stayed the same; taking Diclegis occasionally but it won't be covered under insurance soon; it has mostly resolved now, but felt a 'bulge' when having a BM- ? constipation. Contractions: Not present. Vag. Bleeding: None.  Movement: Present. denies leaking of fluid. Review of Systems:   Pertinent items are noted in HPI Denies abnormal vaginal discharge w/ itching/odor/irritation, headaches, visual changes, shortness of breath, chest pain, abdominal pain, severe nausea/vomiting, or problems with urination or bowel movements unless otherwise stated above. Pertinent History Reviewed:  Reviewed past medical,surgical, social, obstetrical and family history.  Reviewed problem list, medications and allergies. Physical Assessment:   Vitals:   07/26/20 1012  BP: 116/68  Pulse: 84  Weight: 168 lb 6.4 oz (76.4 kg)  Body mass index is 28.91 kg/m.        Physical Examination:   General appearance: Well appearing, and in no distress  Mental status: Alert, oriented to person, place, and time  Skin: Warm & dry  Cardiovascular: Normal heart rate noted  Respiratory: Normal respiratory effort, no distress  Abdomen: Soft, gravid, nontender  Pelvic: Cervical exam deferred         Extremities: Edema: None  Fetal Status: Fetal Heart Rate (bpm): 146 u/s   Movement: Present    No results found for this or any previous visit (from the past 24 hour(s)).  Assessment & Plan:  1) Low-risk pregnancy [id number] at [redacted]w[redacted]d with an Estimated Date of Delivery: 12/16/20    2) Depression/anxiety, doing well on Wellbutrin/Lexapro (sees Dr Lovena Le)  3) N/V of preg, gave her a B6/Unisom recipe since Diclegis is being dropped from insurance  4) Possible constipation, may have resolved now, but discussed stool softener prn and increased fluids   Meds: No orders of the defined types were placed in this encounter.  Labs/procedures today: anatomy scan; 2nd IT  Plan:  Continue routine obstetrical care   Reviewed: Term labor symptoms and general obstetric precautions including but not limited to vaginal bleeding, contractions, leaking of fluid and fetal movement were reviewed in detail with the patient.  All questions were answered. Has home bp cuff. Check bp weekly, let us know if >140/90.   Follow-up: Return in about 4 weeks (around 08/23/2020) for LROB, in person.  Orders Placed This Encounter  Procedures  . INTEGRATED 2   Myrtis Ser [facility] 07/26/2020 10:57 AM

## 2020-07-28 LAB — INTEGRATED 2
AFP MoM: 1.19
Alpha-Fetoprotein: 54.9 ng/mL
Crown Rump Length: 75 mm
DIA MoM: 0.31
DIA Value: 49.6 pg/mL
Estriol, Unconjugated: 1.72 ng/mL
Gest. Age on Collection Date: 13.3 weeks
Gestational Age: 19.3 weeks
Maternal Age at EDD: 34.9 yr
Nuchal Translucency (NT): 1.5 mm
Nuchal Translucency MoM: 0.87
Number of Fetuses: 1
PAPP-A MoM: 0.82
PAPP-A Value: 993.4 ng/mL
Test Results:: NEGATIVE
Weight: 164 [lb_av]
Weight: 164 [lb_av]
hCG MoM: 0.27
hCG Value: 5.9 IU/mL
uE3 MoM: 0.92

## 2020-08-11 ENCOUNTER — Other Ambulatory Visit (HOSPITAL_BASED_OUTPATIENT_CLINIC_OR_DEPARTMENT_OTHER): Payer: Self-pay

## 2020-08-23 ENCOUNTER — Encounter: Payer: Self-pay | Admitting: Advanced Practice Midwife

## 2020-08-23 ENCOUNTER — Other Ambulatory Visit: Payer: Self-pay

## 2020-08-23 ENCOUNTER — Ambulatory Visit (INDEPENDENT_AMBULATORY_CARE_PROVIDER_SITE_OTHER): Payer: No Typology Code available for payment source | Admitting: Advanced Practice Midwife

## 2020-08-23 VITALS — BP 121/78 | HR 89 | Wt 174.0 lb

## 2020-08-23 DIAGNOSIS — Z3A23 23 weeks gestation of pregnancy: Secondary | ICD-10-CM

## 2020-08-23 DIAGNOSIS — Z3480 Encounter for supervision of other normal pregnancy, unspecified trimester: Secondary | ICD-10-CM

## 2020-08-23 NOTE — Progress Notes (Signed)
   LOW-RISK PREGNANCY VISIT Patient name: Brianna Griffin MRN [id number]  Date of birth: Dec 16, 1985 Chief Complaint:   Routine Prenatal Visit (Swelling in ankles; pain in sciatic nerve)  History of Present Illness:   DORAL DIGANGI is a 35 y.o. [id number] female at [redacted]w[redacted]d with an Estimated Date of Delivery: 12/16/20 being seen today for ongoing management of a low-risk pregnancy.  Today she reports concern over ongoing stressful situation with her son and how that may be affecting the baby; doing well on Lexapro/Wellbutrin; had some ankle swelling recently; R sciatic nerve pain after bending; constipation improving. Contractions: Not present. Vag. Bleeding: None.  Movement: Present. denies leaking of fluid. Review of Systems:   Pertinent items are noted in HPI Denies abnormal vaginal discharge w/ itching/odor/irritation, headaches, visual changes, shortness of breath, chest pain, abdominal pain, severe nausea/vomiting, or problems with urination or bowel movements unless otherwise stated above. Pertinent History Reviewed:  Reviewed past medical,surgical, social, obstetrical and family history.  Reviewed problem list, medications and allergies. Physical Assessment:   Vitals:   08/23/20 0838  BP: 121/78  Pulse: 89  Weight: 174 lb (78.9 kg)  Body mass index is 29.87 kg/m.        Physical Examination:   General appearance: Well appearing, and in no distress  Mental status: Alert, oriented to person, place, and time  Skin: Warm & dry  Cardiovascular: Normal heart rate noted  Respiratory: Normal respiratory effort, no distress  Abdomen: Soft, gravid, nontender  Pelvic: Cervical exam deferred         Extremities: Edema: Trace  Fetal Status: Fetal Heart Rate (bpm): 143 Fundal Height: 23 cm Movement: Present    No results found for this or any previous visit (from the past 24 hour(s)).  Assessment & Plan:  1) Low-risk pregnancy [id number] at [redacted]w[redacted]d with an Estimated Date of Delivery: 12/16/20    2) Sciatic pain, maybe stretching it will help  3) Stress in family, reviewed no specific fetal affects, although decreasing stress AMAP is always recommended; stable on Wellbutrin/Lexapro   Meds: No orders of the defined types were placed in this encounter.  Labs/procedures today: none  Plan:  Continue routine obstetrical care   Reviewed: Preterm labor symptoms and general obstetric precautions including but not limited to vaginal bleeding, contractions, leaking of fluid and fetal movement were reviewed in detail with the patient.  All questions were answered. Has home bp cuff. Check bp weekly, let us know if >140/90.   Follow-up: Return in about 4 weeks (around 09/20/2020) for LROB, PN2, in person.  No orders of the defined types were placed in this encounter.  Myrtis Ser CNM 08/23/2020 9:01 AM

## 2020-08-23 NOTE — Patient Instructions (Signed)
Brianna Griffin, I greatly value your feedback.  If you receive a survey following your visit with [place] today, we appreciate you taking the time to fill it out.  Thanks, Derrill Memo, CNM   You will have your sugar test next visit.  Please do not eat or drink anything after midnight the night before you come, not even water.  You will be here for at least two hours.  Please make an appointment online for the bloodwork at [email or web address] for 8:30am (or as close to this as possible). Make sure you select the [facility] service center. The day of the appointment, check in with our office first, then you will go to [place] to start the sugar test.    [place]!!! It is now [place] at [facility] ([place], [place] [address]) Entrance C, located off of [address] parking  Go to [email or web address] to register for FREE online childbirth classes   Call the office [phone]) or go to [facility] if:  You begin to have strong, frequent contractions  Your water breaks.  Sometimes it is a big gush of fluid, sometimes it is just a trickle that keeps getting your panties wet or running down your legs  You have vaginal bleeding.  It is normal to have a small amount of spotting if your cervix was checked.   You don't feel your baby moving like normal.  If you don't, get you something to eat and drink and lay down and focus on feeling your baby move.   If your baby is still not moving like normal, you should call the office or go to [place] Pediatricians/Family Doctors:  [place] [phone]                 [place] [phone] (usually not accepting new patients unless you have family there already, you are always welcome to call and ask)       [facility] Department [phone]       [facility] Pediatricians/Family Doctors:    [facility]: [phone]  [facility]: [phone]  [facility]: [place] Doctors:   [facility]: [facility]: [place]:  [place]: [phone]   Home Blood Pressure Monitoring for Patients   Your provider has recommended that you check your blood pressure (BP) at least once a week at home. If you do not have a blood pressure cuff at home, one will be provided for you. Contact your provider if you have not received your monitor within 1 week.   Helpful Tips for Accurate Home Blood Pressure Checks  . Don't smoke, exercise, or drink caffeine 30 minutes before checking your BP . Use the restroom before checking your BP (a full bladder can raise your pressure) . Relax in a comfortable upright chair . Feet on the ground . Left arm resting comfortably on a flat surface at the level of your heart . Legs uncrossed . Back supported . Sit quietly and don't talk . Place the cuff on your bare arm . Adjust snuggly, so that only two fingertips can fit between your skin and the top of the cuff . Check 2 readings separated by at least one minute . Keep a log of your BP  readings . For a visual, please reference this diagram: http://ccnc.care/bpdiagram  Provider Name: [facility]     Phone: [phone]  Zone 1: ALL CLEAR  Continue to monitor your symptoms:  . BP reading is less than 140 (top number) or less than 90 (bottom number)  . No right upper stomach pain . No headaches or seeing spots . No feeling nauseated or throwing up . No swelling in face and hands  Zone 2: CAUTION Call your doctor's office for any of the following:  . BP reading is greater than 140 (top number) or greater than 90 (bottom number)  . Stomach pain under your ribs in the middle or right side . Headaches or seeing spots . Feeling  nauseated or throwing up . Swelling in face and hands  Zone 3: EMERGENCY  Seek immediate medical care if you have any of the following:  . BP reading is greater than160 (top number) or greater than 110 (bottom number) . Severe headaches not improving with Tylenol . Serious difficulty catching your breath . Any worsening symptoms from Zone 2   Second Trimester of Pregnancy The second trimester is from week 13 through week 28, months 4 through 6. The second trimester is often a time when you feel your best. Your body has also adjusted to being pregnant, and you begin to feel better physically. Usually, morning sickness has lessened or quit completely, you may have more energy, and you may have an increase in appetite. The second trimester is also a time when the fetus is growing rapidly. At the end of the sixth month, the fetus is about 9 inches long and weighs about 1 pounds. You will likely begin to feel the baby move (quickening) between 18 and 20 weeks of the pregnancy. BODY CHANGES Your body goes through many changes during pregnancy. The changes vary from woman to woman.   Your weight will continue to increase. You will notice your lower abdomen bulging out.  You may begin to get stretch marks on your hips, abdomen, and breasts.  You may develop headaches that can be relieved by medicines approved by your health care provider.  You may urinate more often because the fetus is pressing on your bladder.  You may develop or continue to have heartburn as a result of your pregnancy.  You may develop constipation because certain hormones are causing the muscles that push waste through your intestines to slow down.  You may develop hemorrhoids or swollen, bulging veins (varicose veins).  You may have back pain because of the weight gain and pregnancy hormones relaxing your joints between the bones in your pelvis and as a result of a shift in weight and the muscles that support your  balance.  Your breasts will continue to grow and be tender.  Your gums may bleed and may be sensitive to brushing and flossing.  Dark spots or blotches (chloasma, mask of pregnancy) may develop on your face. This will likely fade after the baby is born.  A dark line from your belly button to the pubic area (linea nigra) may appear. This will likely fade after the baby is born.  You may have changes in your hair. These can include thickening of your hair, rapid growth, and changes in texture. Some women also have hair loss during or after pregnancy, or hair that feels dry or thin. Your hair will most likely return to normal after your baby is born. WHAT TO EXPECT AT YOUR PRENATAL VISITS During  a routine prenatal visit:  You will be weighed to make sure you and the fetus are growing normally.  Your blood pressure will be taken.  Your abdomen will be measured to track your baby's growth.  The fetal heartbeat will be listened to.  Any test results from the previous visit will be discussed. Your health care provider may ask you:  How you are feeling.  If you are feeling the baby move.  If you have had any abnormal symptoms, such as leaking fluid, bleeding, severe headaches, or abdominal cramping.  If you have any questions. Other tests that may be performed during your second trimester include:  Blood tests that check for:  Low iron levels (anemia).  Gestational diabetes (between 24 and 28 weeks).  Rh antibodies.  Urine tests to check for infections, diabetes, or protein in the urine.  An ultrasound to confirm the proper growth and development of the baby.  An amniocentesis to check for possible genetic problems.  Fetal screens for spina bifida and Down syndrome. HOME CARE INSTRUCTIONS   Avoid all smoking, herbs, alcohol, and unprescribed drugs. These chemicals affect the formation and growth of the baby.  Follow your health care provider's instructions regarding  medicine use. There are medicines that are either safe or unsafe to take during pregnancy.  Exercise only as directed by your health care provider. Experiencing uterine cramps is a good sign to stop exercising.  Continue to eat regular, healthy meals.  Wear a good support bra for breast tenderness.  Do not use hot tubs, steam rooms, or saunas.  Wear your seat belt at all times when driving.  Avoid raw meat, uncooked cheese, cat litter boxes, and soil used by cats. These carry germs that can cause birth defects in the baby.  Take your prenatal vitamins.  Try taking a stool softener (if your health care provider approves) if you develop constipation. Eat more high-fiber foods, such as fresh vegetables or fruit and whole grains. Drink plenty of fluids to keep your urine clear or pale yellow.  Take warm sitz baths to soothe any pain or discomfort caused by hemorrhoids. Use hemorrhoid cream if your health care provider approves.  If you develop varicose veins, wear support hose. Elevate your feet for 15 minutes, 3-4 times a day. Limit salt in your diet.  Avoid heavy lifting, wear low heel shoes, and practice good posture.  Rest with your legs elevated if you have leg cramps or low back pain.  Visit your dentist if you have not gone yet during your pregnancy. Use a soft toothbrush to brush your teeth and be gentle when you floss.  A sexual relationship may be continued unless your health care provider directs you otherwise.  Continue to go to all your prenatal visits as directed by your health care provider. SEEK MEDICAL CARE IF:   You have dizziness.  You have mild pelvic cramps, pelvic pressure, or nagging pain in the abdominal area.  You have persistent nausea, vomiting, or diarrhea.  You have a bad smelling vaginal discharge.  You have pain with urination. SEEK IMMEDIATE MEDICAL CARE IF:   You have a fever.  You are leaking fluid from your vagina.  You have spotting or  bleeding from your vagina.  You have severe abdominal cramping or pain.  You have rapid weight gain or loss.  You have shortness of breath with chest pain.  You notice sudden or extreme swelling of your face, hands, ankles, feet, or legs.  You  have not felt your baby move in over an hour.  You have severe headaches that do not go away with medicine.  You have vision changes. Document Released: 04/30/2001 Document Revised: 05/11/2013 Document Reviewed: 07/07/2012 [facility] Patient Information 2015 [place], [place]. This information is not intended to replace advice given to you by your health care provider. Make sure you discuss any questions you have with your health care provider.

## 2020-08-24 ENCOUNTER — Other Ambulatory Visit (HOSPITAL_COMMUNITY): Payer: Self-pay

## 2020-09-18 ENCOUNTER — Other Ambulatory Visit (HOSPITAL_COMMUNITY): Payer: Self-pay

## 2020-09-18 MED FILL — Bupropion HCl Tab ER 24HR 300 MG: ORAL | 90 days supply | Qty: 90 | Fill #0 | Status: AC

## 2020-09-18 MED FILL — Escitalopram Oxalate Tab 20 MG (Base Equiv): ORAL | 90 days supply | Qty: 90 | Fill #0 | Status: AC

## 2020-09-21 ENCOUNTER — Ambulatory Visit (INDEPENDENT_AMBULATORY_CARE_PROVIDER_SITE_OTHER): Payer: No Typology Code available for payment source | Admitting: Advanced Practice Midwife

## 2020-09-21 ENCOUNTER — Other Ambulatory Visit: Payer: No Typology Code available for payment source

## 2020-09-21 ENCOUNTER — Other Ambulatory Visit: Payer: Self-pay

## 2020-09-21 ENCOUNTER — Other Ambulatory Visit (HOSPITAL_COMMUNITY): Payer: Self-pay

## 2020-09-21 VITALS — BP 135/79 | HR 92 | Wt 174.0 lb

## 2020-09-21 DIAGNOSIS — Z3480 Encounter for supervision of other normal pregnancy, unspecified trimester: Secondary | ICD-10-CM

## 2020-09-21 DIAGNOSIS — Z3A27 27 weeks gestation of pregnancy: Secondary | ICD-10-CM

## 2020-09-21 DIAGNOSIS — Z131 Encounter for screening for diabetes mellitus: Secondary | ICD-10-CM

## 2020-09-21 DIAGNOSIS — Z3483 Encounter for supervision of other normal pregnancy, third trimester: Secondary | ICD-10-CM

## 2020-09-21 MED ORDER — BONJESTA 20-20 MG PO TBCR
1.0000 | EXTENDED_RELEASE_TABLET | Freq: Two times a day (BID) | ORAL | 6 refills | Status: DC
  Filled 2020-09-21: qty 60, 30d supply, fill #0

## 2020-09-21 MED ORDER — ONDANSETRON 4 MG PO TBDP
4.0000 mg | ORAL_TABLET | Freq: Four times a day (QID) | ORAL | 2 refills | Status: DC | PRN
  Filled 2020-09-21: qty 30, 8d supply, fill #0
  Filled 2020-11-29: qty 30, 8d supply, fill #1

## 2020-09-21 NOTE — Progress Notes (Signed)
   LOW-RISK PREGNANCY VISIT Patient name: Brianna Griffin MRN [id number]  Date of birth: 1985/10/21 Chief Complaint:   Routine Prenatal Visit  History of Present Illness:   Brianna Griffin is a 35 y.o. [id number] female at [redacted]w[redacted]d with an Estimated Date of Delivery: 12/16/20 being seen today for ongoing management of a low-risk pregnancy.  Today she reports insurance not covering diclegis anymore, knows about B6/unisom. Contractions: Not present. Vag. Bleeding: None.  Movement: Present. denies leaking of fluid. Review of Systems:   Pertinent items are noted in HPI Denies abnormal vaginal discharge w/ itching/odor/irritation, headaches, visual changes, shortness of breath, chest pain, abdominal pain, severe nausea/vomiting, or problems with urination or bowel movements unless otherwise stated above. Pertinent History Reviewed:  Reviewed past medical,surgical, social, obstetrical and family history.  Reviewed problem list, medications and allergies. Physical Assessment:   Vitals:   09/21/20 0852  BP: 135/79  Pulse: 92  Weight: 174 lb (78.9 kg)  Body mass index is 29.87 kg/m.        Physical Examination:   General appearance: Well appearing, and in no distress  Mental status: Alert, oriented to person, place, and time  Skin: Warm & dry  Cardiovascular: Normal heart rate noted  Respiratory: Normal respiratory effort, no distress  Abdomen: Soft, gravid, nontender  Pelvic: Cervical exam deferred         Extremities: Edema: Trace  Fetal Status: Fetal Heart Rate (bpm): 160 Fundal Height: 27 cm Movement: Present    Chaperone: n/a    No results found for this or any previous visit (from the past 24 hour(s)).  Assessment & Plan:  1) Low-risk pregnancy [id number] at [redacted]w[redacted]d with an Estimated Date of Delivery: 12/16/20   2)  Nausea: will try to see if  [name] is covered   Meds:  Meds ordered this encounter  Medications  . Doxylamine-Pyridoxine ER (BONJESTA) 20-20 MG TBCR    Sig: Take 1  tablet by mouth 2 (two) times daily.    Dispense:  60 tablet    Refill:  6    Please let pt know cost prior to mailing (does not plan to take it if insurance isn't providing good coverage)    Order Specific Question:   Supervising Provider    Answer:   Elonda Husky, LUTHER H [[id number]]  . ondansetron (ZOFRAN ODT) 4 MG disintegrating tablet    Sig: Take 1 tablet (4 mg total) by mouth every 6 (six) hours as needed for nausea.    Dispense:  30 tablet    Refill:  2    Order Specific Question:   Supervising Provider    Answer:   Florian Buff [[id number]]   Labs/procedures today: PN2  Plan:  Continue routine obstetrical care  Next visit: prefers in person    Reviewed: Preterm labor symptoms and general obstetric precautions including but not limited to vaginal bleeding, contractions, leaking of fluid and fetal movement were reviewed in detail with the patient.  All questions were answered. Has home bp cuff. Check bp weekly, let us know if >140/90.   Follow-up: No follow-ups on file.  No orders of the defined types were placed in this encounter.  Christin Fudge DNP, CNM 09/21/2020 11:19 AM

## 2020-09-21 NOTE — Patient Instructions (Signed)
[phone] is the phone number for Pregnancy Classes or hospital tours at [facility].   You will be referred to  [email or web address]   for more information on childbirth classes   At this site you may register for classes. You may sign up for a waiting list if classes are full. Please SIGN UP FOR THIS!.   When the waiting list becomes long, sometimes new classes can be added.  [facility] [place] Call to Register: [phone] or [phone]   or   Register Online: [email or web address] THESE CLASSES FILL UP VERY QUICKLY, SO SIGN UP AS SOON AS YOU CAN!!! Please visit [facility] pregnancy website at [email or web address]  Childbirth Classes  Option 1: Birth & Baby Series ? Series of 3 weekly classes, on the same day of the week (can choose Mon-Thurs) from 6-9pm ? Helps you and your support person prepare for childbirth ? Reviews newborn care, labor & birth, cesarean birth, pain management, and comfort techniques ? Cost: $60 per couple for insured or self-pay, $30 per couple for Medicaid  Option 2: Weekend Birth & Baby ? This class is a weekend version of our Birth & Baby series.  It is designed for parents who have a difficult time fitting several weeks of classes into their schedule.   ? Covers the care of your newborn and the basics of labor and childbirth ? Friday 6:30pm-8:30pm Saturday 9am-4pm, includes lunch for you and your partner  ? Cost: $75 per couple for insured or self-pay, $30 per couple for Medicaid  Option 3: Natural Childbirth ? This series of 5 weekly classes is for expectant parents who want to learn and practice natural methods of coping with the process of labor and childbirth.  Can choose Mon or Tues, 7-9pm.   ? Covers relaxation, breathing, massage, visualization, role of the partner, and helpful positioning ? Participants learn how to be confident  in their body's ability to give birth. Class empowers and helps parents make informed decisions about care. Includes discussion that will help new parents transition into the immediate postpartum period.  ? Cost: $75 per couple for insured or self-pay, $30 per couple for Medicaid  Option 4: Online Birth & Baby ? This online class offers you the freedom to complete a Birth & Baby series in the comfort of your own home.  The flexibility of this option allows you to review sections at your own pace, at times convenient to you and your support people.  It includes additional video information, animations, quizzes and extended activities. Get organized with helpful eClass tools, checklists, and trackers.  ? Cost: $60 for 60 days of online access                                                                            Other Available Classes  Baby & Me Enjoy this time to discuss newborn & infant parenting topics and family adjustment issues with other new mothers in a relaxed environment. Each week brings a new speaker or baby-centered activity. We encourage mothers and their babies (birth to crawling) to join us. You are welcome to visit this group even if you haven't delivered yet! It's wonderful to make new friends early   and watch other moms interact with their babies. No registration or fee.  Big Brother/Big Sister Let your children share in the joy of a new brother or sister in this special class designed just for them. Discussion includes how families care for babies: swaddling, holding, diapering, safety, as well as how they can be helpful in their new role. This class is designed for children ages 2 to 6, but any age is welcome. Please register each child individually. $5 Breastfeeding Support Group This group is a mother-to-mother support circle where moms have the opportunity to share their breastfeeding experiences. A Breastfeeding Support nurse is present for questions and concerns. An infant  scale is available for weight checks. No fee or registration.  Breastfeeding Your Baby Breastfeeding is a special time for mother and child. This class will help you feel ready to begin this important relationship. Your partner is encouraged to attend with you. Learn what to expect and feel more confident in the first days of breastfeeding your newborn. This class also addresses the most common fears and challenges of breastfeeding during the first few weeks, months, and beyond. $30 per couple Caring for Baby This class is for expectant and adoptive parents who want to learn and practice the most up-to-date newborn care for their babies. Focus is on birth through first six weeks of life. Topics include feeding, bathing, diapering, crying, umbilical cord care, circumcision care and safe sleep. Parents learn how to recognize symptoms of illness and when to call the pediatrician. Register only the mom-to-be and your partner can plan to come with you. (*Note: This class is included in the Birth & Baby series and the Weekend Birth & Baby classes.) $10 per couple Comfort Techniques & Tour This 2-hour interactive class is designed for those who either do not wish to take the Birth & Baby series or for those who prefer our online childbirth class, but don't want to miss the opportunity to learn and practice hands-on techniques. These skills can help relieve some of the discomfort of labor and encourage your baby to rotate toward the best position for birth. You and your partner will be able to try a variety of labor positions with birth balls and rebozos as well as practice breathing, relaxation, and visual techniques. $20 per couple Daddy Boot Camp This course offers Dads-to-be the tools and knowledge needed to feel confident on their journey to becoming new fathers. Experienced dads, who have been trained as coaches, teach dads-to-be how to hold, comfort, diapers, swaddle and play with their infant while being  able to support the new mom as well. $25 Grandparent Love Expecting a grandbaby? Learn about the latest infant care and safety recommendations and ways to support your own child as he or she transitions into the parenting role. $10 per person Infant and Child CPR Parents, grandparents, babysitters, and friends learn Cardio-Pulmonary Resuscitation skills for infants and children. You will also learn how to treat both conscious and unconscious choking infants and children. Register each participant individually. (Note: This Family & Friends program does not offer certification.) $20 per person Marvelous Multiples Expecting twins, triplets, or more? This free 2-hour class covers the differences in labor, birth, parenting, and breastfeeding issues that face multiples' parents.  Maternity Care Center Virtual Tour  Online virtual tour of the new [place] [facility]  Mom Talk This free mom-led group offers support and connection to mothers as they journey through the adjustments and struggles of that   sometimes overwhelming first year after the birth of a child. A member of our staff will be present to share resources and additional support if needed, as you care for yourself and baby. You are welcome to visit this group before you deliver! It's wonderful to meet new friends early and watch other moms interact with their babies.  Waterbirth Class Interested in a waterbirth? This free informational class will help you discover whether waterbirth is the right fit for you and is required if you are planning a waterbirth. Education about waterbirth itself, supplies you may need, and what you may need from your support team is included in this class. Partners are encouraged to come.    

## 2020-09-22 ENCOUNTER — Other Ambulatory Visit (HOSPITAL_COMMUNITY): Payer: Self-pay

## 2020-09-22 LAB — CBC
Hematocrit: 34.8 % (ref 34.0–46.6)
Hemoglobin: 11.8 g/dL (ref 11.1–15.9)
MCH: 31.5 pg (ref 26.6–33.0)
MCHC: 33.9 g/dL (ref 31.5–35.7)
MCV: 93 fL (ref 79–97)
Platelets: 203 10*3/uL (ref 150–450)
RBC: 3.75 x10E6/uL — ABNORMAL LOW (ref 3.77–5.28)
RDW: 12.4 % (ref 11.7–15.4)
WBC: 9.3 10*3/uL (ref 3.4–10.8)

## 2020-09-22 LAB — GLUCOSE TOLERANCE, 2 HOURS W/ 1HR
Glucose, 1 hour: 105 mg/dL (ref 65–179)
Glucose, 2 hour: 108 mg/dL (ref 65–152)
Glucose, Fasting: 80 mg/dL (ref 65–91)

## 2020-09-22 LAB — RPR: RPR Ser Ql: NONREACTIVE

## 2020-09-22 LAB — ANTIBODY SCREEN: Antibody Screen: NEGATIVE

## 2020-09-22 LAB — HIV ANTIBODY (ROUTINE TESTING W REFLEX): HIV Screen 4th Generation wRfx: NONREACTIVE

## 2020-09-25 ENCOUNTER — Other Ambulatory Visit (HOSPITAL_COMMUNITY): Payer: Self-pay

## 2020-09-26 ENCOUNTER — Other Ambulatory Visit: Payer: Self-pay | Admitting: Women's Health

## 2020-09-26 ENCOUNTER — Other Ambulatory Visit (HOSPITAL_COMMUNITY): Payer: Self-pay

## 2020-09-26 MED ORDER — BREAST PUMP MISC
0 refills | Status: DC
  Filled 2020-09-26: qty 1, fill #0

## 2020-10-19 ENCOUNTER — Encounter: Payer: No Typology Code available for payment source | Admitting: Advanced Practice Midwife

## 2020-10-19 ENCOUNTER — Other Ambulatory Visit: Payer: Self-pay

## 2020-10-19 ENCOUNTER — Ambulatory Visit (INDEPENDENT_AMBULATORY_CARE_PROVIDER_SITE_OTHER): Payer: No Typology Code available for payment source | Admitting: Advanced Practice Midwife

## 2020-10-19 VITALS — BP 122/82 | HR 105 | Wt 177.0 lb

## 2020-10-19 DIAGNOSIS — Z23 Encounter for immunization: Secondary | ICD-10-CM | POA: Diagnosis not present

## 2020-10-19 DIAGNOSIS — Z3A31 31 weeks gestation of pregnancy: Secondary | ICD-10-CM

## 2020-10-19 DIAGNOSIS — Z3483 Encounter for supervision of other normal pregnancy, third trimester: Secondary | ICD-10-CM

## 2020-10-19 NOTE — Patient Instructions (Signed)
Brianna Griffin, I greatly value your feedback.  If you receive a survey following your visit with [place] today, we appreciate you taking the time to fill it out.  Thanks, Nigel Berthold, DNP, CNM  [place]!!! It is now [place] at [facility] ([place],  [address]) Entrance located off of [address] parking   Go to [email or web address] to register for FREE online childbirth classes    Call the office [phone]) or go to [place] if:  You begin to have strong, frequent contractions  Your water breaks.  Sometimes it is a big gush of fluid, sometimes it is just a trickle that keeps getting your panties wet or running down your legs  You have vaginal bleeding.  It is normal to have a small amount of spotting if your cervix was checked.   You don't feel your baby moving like normal.  If you don't, get you something to eat and drink and lay down and focus on feeling your baby move.  You should feel at least 10 movements in 2 hours.  If you don't, you should call the office or go to [place] Blood Pressure Monitoring for Patients   Your provider has recommended that you check your blood pressure (BP) at least once a week at home. If you do not have a blood pressure cuff at home, one will be provided for you. Contact your provider if you have not received your monitor within 1 week.   Helpful Tips for Accurate Home Blood Pressure Checks  . Don't smoke, exercise, or drink caffeine 30 minutes before checking your BP . Use the restroom before checking your BP (a full bladder can raise your pressure) . Relax in a comfortable upright chair . Feet on the ground . Left arm resting comfortably on a flat surface at the level of your heart . Legs uncrossed . Back supported . Sit quietly and don't talk . Place the cuff on your bare arm . Adjust snuggly, so that only two fingertips can fit  between your skin and the top of the cuff . Check 2 readings separated by at least one minute . Keep a log of your BP readings . For a visual, please reference this diagram: http://ccnc.care/bpdiagram  Provider Name: [facility]     Phone: [phone]  Zone 1: ALL CLEAR  Continue to monitor your symptoms:  . BP reading is less than 140 (top number) or less than 90 (bottom number)  . No right upper stomach pain . No headaches or seeing spots . No feeling nauseated or throwing up . No swelling in face and hands  Zone 2: CAUTION Call your doctor's office for any of the following:  . BP reading is greater than 140 (top number) or greater than 90 (bottom number)  . Stomach pain under your ribs in the middle or right side . Headaches or seeing spots . Feeling nauseated or throwing up . Swelling in face and hands  Zone 3: EMERGENCY  Seek immediate medical care if you have any of the following:  . BP reading is greater than160 (top number) or greater than 110 (bottom number) . Severe headaches not improving with Tylenol . Serious difficulty catching your breath . Any worsening symptoms from Zone 2

## 2020-10-19 NOTE — Progress Notes (Signed)
   LOW-RISK PREGNANCY VISIT Patient name: Brianna Griffin MRN [id number]  Date of birth: January 13, 1986 Chief Complaint:   Routine Prenatal Visit  History of Present Illness:   Brianna Griffin is a 35 y.o. [id number] female at [redacted]w[redacted]d with an Estimated Date of Delivery: 12/16/20 being seen today for ongoing management of a low-risk pregnancy.  Today she reports stress w/family stuff. In counseling. Discussed gathering tools to help her deal w/her reaction to stressors.. Contractions: Not present. Vag. Bleeding: None.  Movement: Present. denies leaking of fluid. Has started having some pressure Review of Systems:   Pertinent items are noted in HPI Denies abnormal vaginal discharge w/ itching/odor/irritation, headaches, visual changes, shortness of breath, chest pain, abdominal pain, severe nausea/vomiting, or problems with urination or bowel movements unless otherwise stated above. Pertinent History Reviewed:  Reviewed past medical,surgical, social, obstetrical and family history.  Reviewed problem list, medications and allergies. Physical Assessment:   Vitals:   10/19/20 1500 10/19/20 1532  BP: 139/82 122/82  Pulse: (!) 105   Weight: 177 lb (80.3 kg)   Body mass index is 30.38 kg/m.        Physical Examination:   General appearance: Well appearing, and in no distress  Mental status: Alert, oriented to person, place, and time  Skin: Warm & dry  Cardiovascular: Normal heart rate noted  Respiratory: Normal respiratory effort, no distress  Abdomen: Soft, gravid, nontender  Pelvic: Cervical exam performed  Dilation: 1 Effacement (%): Thick Station: -2  Extremities: Edema: Trace  Fetal Status: Fetal Heart Rate (bpm): 141 Fundal Height: 31 cm Movement: Present Presentation: Vertex  Chaperone: Amanda Rash    No results found for this or any previous visit (from the past 24 hour(s)).  Assessment & Plan:  1) Low-risk pregnancy [id number] at [redacted]w[redacted]d with an Estimated Date of Delivery: 12/16/20   2) ?  Borderline BPs, cuff verified here, new one given. Let us know if starts getting ^^ pressures at home.   Meds: No orders of the defined types were placed in this encounter.    Plan:  Continue routine obstetrical care  Next visit: prefers in person    Reviewed: Preterm labor symptoms and general obstetric precautions including but not limited to vaginal bleeding, contractions, leaking of fluid and fetal movement were reviewed in detail with the patient.  All questions were answered. Has home bp cuff, given another one today dt ? Too low readings>? accuracy. Check bp weekly, let us know if >140/90.   Follow-up: Return in about 2 weeks (around 11/02/2020) for [place].  Orders Placed This Encounter  Procedures  . Tdap vaccine greater than or equal to 7yo IM   Christin Fudge DNP, CNM 10/19/2020 3:43 PM

## 2020-10-25 ENCOUNTER — Telehealth: Payer: Self-pay

## 2020-10-25 DIAGNOSIS — F418 Other specified anxiety disorders: Secondary | ICD-10-CM

## 2020-10-25 NOTE — Telephone Encounter (Signed)
Yes pls give referral for depression and anxiety.   Thx. Dr. Lovena Le

## 2020-10-25 NOTE — Telephone Encounter (Signed)
Please advise. Thank you

## 2020-10-25 NOTE — Telephone Encounter (Signed)
Insurance requires referral for [place] she has [employer] through [employer] back [phone]

## 2020-10-25 NOTE — Telephone Encounter (Signed)
Referral placed and pt is aware. 

## 2020-10-31 ENCOUNTER — Encounter: Payer: Self-pay | Admitting: *Deleted

## 2020-11-02 ENCOUNTER — Other Ambulatory Visit: Payer: Self-pay

## 2020-11-02 ENCOUNTER — Ambulatory Visit (INDEPENDENT_AMBULATORY_CARE_PROVIDER_SITE_OTHER): Payer: No Typology Code available for payment source | Admitting: Family Medicine

## 2020-11-02 VITALS — BP 123/84 | HR 93 | Wt 175.8 lb

## 2020-11-02 DIAGNOSIS — J45909 Unspecified asthma, uncomplicated: Secondary | ICD-10-CM

## 2020-11-02 DIAGNOSIS — F418 Other specified anxiety disorders: Secondary | ICD-10-CM

## 2020-11-02 DIAGNOSIS — R030 Elevated blood-pressure reading, without diagnosis of hypertension: Secondary | ICD-10-CM

## 2020-11-02 DIAGNOSIS — Z3480 Encounter for supervision of other normal pregnancy, unspecified trimester: Secondary | ICD-10-CM

## 2020-11-02 NOTE — Progress Notes (Signed)
   PRENATAL VISIT NOTE  Subjective:  Brianna Griffin is a 35 y.o. [id number] at [redacted]w[redacted]d being seen today for ongoing prenatal care.  She is currently monitored for the following issues for this low-risk pregnancy and has Asthma, chronic; IBS; Gastroesophageal reflux disease with esophagitis; Depression with anxiety; Migraine without aura and without status migrainosus, not intractable; Elevated BP without diagnosis of hypertension; H/O transient HTN; and Encounter for supervision of normal pregnancy, antepartum on their problem list.  Patient reports no complaints.  Contractions: Not present. Vag. Bleeding: None.  Movement: Present. Denies leaking of fluid.   The following portions of the patient's history were reviewed and updated as appropriate: allergies, current medications, past family history, past medical history, past social history, past surgical history and problem list.   Objective:   Vitals:   11/02/20 1533  BP: 123/84  Pulse: 93  Weight: 175 lb 12.8 oz (79.7 kg)    Fetal Status:     Movement: Present     General:  Alert, oriented and cooperative. Patient is in no acute distress.  Skin: Skin is warm and dry. No rash noted.   Cardiovascular: Normal heart rate noted  Respiratory: Normal respiratory effort, no problems with respiration noted  Abdomen: Soft, gravid, appropriate for gestational age.  Pain/Pressure: Present     Pelvic: Cervical exam deferred        Extremities: Normal range of motion.  Edema: Trace  Mental Status: Normal mood and affect. Normal behavior. Normal judgment and thought content.   Assessment and Plan:  Pregnancy: [id number] at [redacted]w[redacted]d 1. Supervision of other normal pregnancy, antepartum -doing well without complaints -VSS -discussed contraception, still deciding between POPs and depo -gbs/gcc at next appt  2. Elevated BP without diagnosis of hypertension History of elevated BP in the past, no diagnosis. 09/21/20 was elevated to 135/79, 10/19/20 was 139/82.  Today BP well controlled. PreE labs previously normal. Asymptomatic. Cannot take bASA. Continue to monitor.  3. Depression with anxiety Taking wellbutrin and lexapro. Mood stable.  4. Chronic asthma, unspecified asthma severity, unspecified whether complicated, unspecified whether persistent No history of hospitalization/intubation. Taking albuterol PRN.Marland Kitchen  Preterm labor symptoms and general obstetric precautions including but not limited to vaginal bleeding, contractions, leaking of fluid and fetal movement were reviewed in detail with the patient. Please refer to After Visit Summary for other counseling recommendations.   Return in about 2 weeks (around 11/16/2020) for [place].  Future Appointments  Date Time Provider [place]  11/16/2020  3:30 PM Florian Buff, MD [facility]    Arrie Senate, MD

## 2020-11-06 ENCOUNTER — Telehealth: Payer: No Typology Code available for payment source | Admitting: Physician Assistant

## 2020-11-06 DIAGNOSIS — J069 Acute upper respiratory infection, unspecified: Secondary | ICD-10-CM

## 2020-11-06 MED ORDER — FLUTICASONE PROPIONATE 50 MCG/ACT NA SUSP: 2.0000 | Freq: Every day | NASAL | 0 refills | Status: DC

## 2020-11-06 NOTE — Progress Notes (Signed)
We are sorry you are not feeling well.  Here is how we plan to help!  Based on what you have shared with me, it looks like you may have a viral upper respiratory infection.  Upper respiratory infections are caused by a large number of viruses; however, rhinovirus is the most common cause.   Symptoms vary from person to person, with common symptoms including sore throat, cough, fatigue or lack of energy and feeling of general discomfort.  A low-grade fever of up to 100.4 may present, but is often uncommon.  Symptoms vary however, and are closely related to a person's age or underlying illnesses.  The most common symptoms associated with an upper respiratory infection are nasal discharge or congestion, cough, sneezing, headache and pressure in the ears and face.  These symptoms usually persist for about 3 to 10 days, but can last up to 2 weeks.  It is important to know that upper respiratory infections do not cause serious illness or complications in most cases.    Upper respiratory infections can be transmitted from person to person, with the most common method of transmission being a person's hands.  The virus is able to live on the skin and can infect other persons for up to 2 hours after direct contact.  Also, these can be transmitted when someone coughs or sneezes; thus, it is important to cover the mouth to reduce this risk.  To keep the spread of the illness at [place], good hand hygiene is very important.  This is an infection that is most likely caused by a virus. There are no specific treatments other than to help you with the symptoms until the infection runs its course.  We are sorry you are not feeling well.  Here is how we plan to help!    Saline nasal spray or nasal drops can help and can safely be used as often as needed for congestion.  For your congestion, I have prescribed Fluticasone nasal spray one spray in each nostril twice a day   If you have a sore or scratchy throat, use a saltwater  gargle-  to  teaspoon of salt dissolved in a 4-ounce to 8-ounce glass of warm water.  Gargle the solution for approximately 15-30 seconds and then spit.  It is important not to swallow the solution.  You can also use throat lozenges/cough drops and Chloraseptic spray to help with throat pain or discomfort.  Warm or cold liquids can also be helpful in relieving throat pain.  For headache, pain or general discomfort, you can use  Tylenol as directed.   Some authorities believe that zinc sprays or the use of Echinacea may shorten the course of your symptoms.   HOME CARE Only take medications as instructed by your medical team. Be sure to drink plenty of fluids. Water is fine as well as fruit juices, sodas and electrolyte beverages. You may want to stay away from caffeine or alcohol. If you are nauseated, try taking small sips of liquids. How do you know if you are getting enough fluid? Your urine should be a pale yellow or almost colorless. Get rest. Taking a steamy shower or using a humidifier may help nasal congestion and ease sore throat pain. You can place a towel over your head and breathe in the steam from hot water coming from a faucet. Using a saline nasal spray works much the same way. Cough drops, hard candies and sore throat lozenges may ease your cough. Avoid close contacts  especially the very young and the elderly Cover your mouth if you cough or sneeze Always remember to wash your hands.   GET HELP RIGHT AWAY IF: You develop worsening fever. If your symptoms do not improve within 10 days You develop yellow or green discharge from your nose over 3 days. You have coughing fits You develop a severe head ache or visual changes. You develop shortness of breath, difficulty breathing or start having chest pain Your symptoms persist after you have completed your treatment plan  MAKE SURE YOU  Understand these instructions. Will watch your condition. Will get help right away if you  are not doing well or get worse.  Your e-visit answers were reviewed by a board certified advanced clinical practitioner to complete your personal care plan. Depending upon the condition, your plan could have included both over the counter or prescription medications. Please review your pharmacy choice. If there is a problem, you may call our nursing hot line at and have the prescription routed to another pharmacy. Your safety is important to [place]. If you have drug allergies check your prescription carefully.   You can use MyChart to ask questions about today's visit, request a non-urgent call back, or ask for a work or school excuse for 24 hours related to this e-Visit. If it has been greater than 24 hours you will need to follow up with your provider, or enter a new e-Visit to address those concerns. You will get an e-mail in the next two days asking about your experience.  I hope that your e-visit has been valuable and will speed your recovery. Thank you for using e-visits.   Approximately 5 minutes was spent documenting and reviewing patient's chart.

## 2020-11-08 ENCOUNTER — Other Ambulatory Visit: Payer: Self-pay

## 2020-11-08 ENCOUNTER — Ambulatory Visit (INDEPENDENT_AMBULATORY_CARE_PROVIDER_SITE_OTHER): Payer: No Typology Code available for payment source | Admitting: Family Medicine

## 2020-11-08 VITALS — HR 112 | Temp 99.1°F | Wt 174.8 lb

## 2020-11-08 DIAGNOSIS — J0100 Acute maxillary sinusitis, unspecified: Secondary | ICD-10-CM

## 2020-11-08 DIAGNOSIS — H1032 Unspecified acute conjunctivitis, left eye: Secondary | ICD-10-CM

## 2020-11-08 MED ORDER — POLYMYXIN B-TRIMETHOPRIM 10000-0.1 UNIT/ML-% OP SOLN: 1.0000 [drp] | OPHTHALMIC | 0 refills | Status: DC

## 2020-11-08 MED ORDER — AMOXICILLIN 500 MG PO CAPS: 500.0000 mg | ORAL_CAPSULE | Freq: Three times a day (TID) | ORAL | 0 refills | Status: AC

## 2020-11-08 NOTE — Progress Notes (Signed)
Patient ID: Brianna Griffin, female    DOB: [birth year], 35 y.o.   MRN: [id number]   Chief Complaint  Patient presents with   Sinusitis   Subjective:    HPI Pt having sinus issues(cough, congestion, sore throat). Symptoms not getting any better. Covid test on Sunday negative and e-visit on Monday. Has used Tylenol Sinus and headache.  Left eye itching and drainage. Non productive coughing. Has been sick for about 4 days. No fever.  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   Outpatient Encounter Medications as of 11/08/2020  Medication Sig   albuterol (VENTOLIN HFA) 108 (90 Base) MCG/ACT inhaler INHALE 2 PUFFS EVERY 4 HOURS AS NEEDED FOR WHEEZING.   [EXPIRED] amoxicillin (AMOXIL) 500 MG capsule Take 1 capsule (500 mg total) by mouth 3 (three) times daily for 10 days.   buPROPion (WELLBUTRIN XL) 300 MG 24 hr tablet TAKE 1 TABLET BY MOUTH AT BEDTIME   Cetirizine HCl 10 MG CAPS Zyrtec prn   escitalopram (LEXAPRO) 20 MG tablet TAKE 1 TABLET BY MOUTH ONCE A DAY   fluticasone (FLONASE) 50 MCG/ACT nasal spray Place 2 sprays into both nostrils daily.   hydrOXYzine (ATARAX/VISTARIL) 10 MG tablet TAKE ONE TABLET BY MOUTH EVERY 8 HOURS AS NEEDED FOR ANXIETY.   Misc. Devices (BREAST PUMP) MISC Double electric breast pump   ondansetron (ZOFRAN ODT) 4 MG disintegrating tablet Take 1 tablet (4 mg total) by mouth every 6 (six) hours as needed for nausea.   Prenatal MV-Min-Fe Fum-FA-DHA (PRENATAL 1 PO) Take by mouth.   [DISCONTINUED] trimethoprim-polymyxin b (POLYTRIM) ophthalmic solution Place 1  drop into the left eye every 4 (four) hours. For 7 days.   No facility-administered encounter medications on file as of 11/08/2020.     Review of Systems  Constitutional:  Negative for chills and fever.  HENT:  Negative for congestion, ear pain, rhinorrhea, sinus pressure, sinus pain and sore throat.   Eyes:  Negative for pain, discharge and itching.  Respiratory:  Negative for cough.   Gastrointestinal:  Negative for constipation, diarrhea, nausea and vomiting.    Vitals Pulse (!) 112   Temp 99.1 F (37.3 C)   Wt 174 lb 12.8 oz (79.3 kg)   LMP 03/11/2020 (Exact Date)   SpO2 99%   BMI 30.00 kg/m   Objective:   Physical Exam Vitals and nursing note reviewed.  Constitutional:      General: She is not in acute distress.    Appearance: Normal appearance. She is not ill-appearing or toxic-appearing.  HENT:     Head: Normocephalic and atraumatic.     Right Ear: Tympanic membrane, ear canal and external ear normal.     Left Ear: Tympanic membrane, ear canal and external ear normal.     Nose: Nose normal. No congestion or rhinorrhea.     Comments: +ttp over maxillary sinuses.     Mouth/Throat:     Mouth: Mucous membranes are moist.     Pharynx: Oropharynx is clear. No oropharyngeal exudate or posterior oropharyngeal erythema.  Eyes:     Extraocular Movements: Extraocular movements intact.     Pupils: Pupils are equal, round, and reactive to light.     Comments: +erythema in left conjunctiva.  No drainage   Cardiovascular:     Rate and Rhythm: Normal rate and regular rhythm.     Pulses: Normal pulses.     Heart sounds: Normal heart sounds.  Pulmonary:     Effort: Pulmonary effort is normal. No respiratory distress.     Breath sounds: Normal breath sounds. No wheezing, rhonchi or rales.  Musculoskeletal:     Cervical back: Normal range of motion and neck supple. No tenderness.  Lymphadenopathy:     Cervical: No cervical adenopathy.  Skin:    General: Skin is warm and  dry.     Findings: No rash.  Neurological:     Mental Status: She is alert and oriented to person, place, and time.  Psychiatric:        Mood and Affect: Mood normal.        Behavior: Behavior normal.    Left eye- slight swelling, conjunctiva is injected. No drainage. Rt eye normal.   Assessment and Plan   1. Acute non-recurrent maxillary sinusitis - amoxicillin (AMOXIL) 500 MG capsule; Take 1 capsule (500 mg total) by mouth 3 (three) times daily for 10 days.  Dispense: 30 capsule; Refill: 0 - Novel Coronavirus, NAA ([facility]) - SARS-COV-2, NAA 2 DAY TAT  2. Acute bacterial conjunctivitis of left eye   Gave polytrim for left eye conjunctivitis. Sinusitis- gave amoxicillin.  Covid testing pending.  Pt to quarantine till results.  No follow-ups on file.

## 2020-11-09 ENCOUNTER — Telehealth: Payer: Self-pay

## 2020-11-09 NOTE — Telephone Encounter (Signed)
Not sure, what the ear drop is, but for pain in ear I would use tylenol for pain.   Dr. Lovena Le

## 2020-11-09 NOTE — Telephone Encounter (Signed)
Left a message for a return call for details per drs notes.

## 2020-11-09 NOTE — Telephone Encounter (Signed)
Spoke with patient and she states her ear pain has subsided with the amoxicillin and tylenol.

## 2020-11-10 LAB — NOVEL CORONAVIRUS, NAA: SARS-CoV-2, NAA: NOT DETECTED

## 2020-11-10 LAB — SARS-COV-2, NAA 2 DAY TAT

## 2020-11-11 ENCOUNTER — Inpatient Hospital Stay (HOSPITAL_COMMUNITY)
Admission: AD | Admit: 2020-11-11 | Discharge: 2020-11-11 | Disposition: A | Discharge status: Home / Self Care | Payer: No Typology Code available for payment source | Attending: Obstetrics & Gynecology | Admitting: Obstetrics & Gynecology

## 2020-11-11 ENCOUNTER — Other Ambulatory Visit: Payer: Self-pay

## 2020-11-11 DIAGNOSIS — Z3A35 35 weeks gestation of pregnancy: Secondary | ICD-10-CM | POA: Diagnosis not present

## 2020-11-11 DIAGNOSIS — O99513 Diseases of the respiratory system complicating pregnancy, third trimester: Secondary | ICD-10-CM | POA: Insufficient documentation

## 2020-11-11 DIAGNOSIS — Z79899 Other long term (current) drug therapy: Secondary | ICD-10-CM | POA: Insufficient documentation

## 2020-11-11 DIAGNOSIS — E039 Hypothyroidism, unspecified: Secondary | ICD-10-CM | POA: Diagnosis not present

## 2020-11-11 DIAGNOSIS — R030 Elevated blood-pressure reading, without diagnosis of hypertension: Secondary | ICD-10-CM | POA: Diagnosis not present

## 2020-11-11 DIAGNOSIS — Z3480 Encounter for supervision of other normal pregnancy, unspecified trimester: Secondary | ICD-10-CM

## 2020-11-11 DIAGNOSIS — K219 Gastro-esophageal reflux disease without esophagitis: Secondary | ICD-10-CM | POA: Insufficient documentation

## 2020-11-11 DIAGNOSIS — F32A Depression, unspecified: Secondary | ICD-10-CM | POA: Diagnosis not present

## 2020-11-11 DIAGNOSIS — F419 Anxiety disorder, unspecified: Secondary | ICD-10-CM | POA: Diagnosis not present

## 2020-11-11 DIAGNOSIS — Z3689 Encounter for other specified antenatal screening: Secondary | ICD-10-CM

## 2020-11-11 DIAGNOSIS — J45909 Unspecified asthma, uncomplicated: Secondary | ICD-10-CM | POA: Insufficient documentation

## 2020-11-11 DIAGNOSIS — K589 Irritable bowel syndrome without diarrhea: Secondary | ICD-10-CM | POA: Insufficient documentation

## 2020-11-11 DIAGNOSIS — O99283 Endocrine, nutritional and metabolic diseases complicating pregnancy, third trimester: Secondary | ICD-10-CM | POA: Insufficient documentation

## 2020-11-11 DIAGNOSIS — O99613 Diseases of the digestive system complicating pregnancy, third trimester: Secondary | ICD-10-CM | POA: Diagnosis not present

## 2020-11-11 DIAGNOSIS — O26893 Other specified pregnancy related conditions, third trimester: Secondary | ICD-10-CM | POA: Diagnosis not present

## 2020-11-11 DIAGNOSIS — O99343 Other mental disorders complicating pregnancy, third trimester: Secondary | ICD-10-CM | POA: Diagnosis not present

## 2020-11-11 LAB — COMPREHENSIVE METABOLIC PANEL
ALT: 16 U/L (ref 0–44)
AST: 18 U/L (ref 15–41)
Albumin: 2.6 g/dL — ABNORMAL LOW (ref 3.5–5.0)
Alkaline Phosphatase: 190 U/L — ABNORMAL HIGH (ref 38–126)
Anion gap: 13 (ref 5–15)
BUN: 7 mg/dL (ref 6–20)
CO2: 20 mmol/L — ABNORMAL LOW (ref 22–32)
Calcium: 8.9 mg/dL (ref 8.9–10.3)
Chloride: 101 mmol/L (ref 98–111)
Creatinine, Ser: 0.58 mg/dL (ref 0.44–1.00)
GFR, Estimated: >60 mL/min (ref >60)
Glucose, Bld: 99 mg/dL (ref 70–99)
Potassium: 3.5 mmol/L (ref 3.5–5.1)
Sodium: 134 mmol/L — ABNORMAL LOW (ref 135–145)
Total Bilirubin: 0.6 mg/dL (ref 0.3–1.2)
Total Protein: 6.5 g/dL (ref 6.5–8.1)

## 2020-11-11 LAB — URINALYSIS, ROUTINE W REFLEX MICROSCOPIC
Bilirubin Urine: NEGATIVE
Glucose, UA: NEGATIVE mg/dL
Hgb urine dipstick: NEGATIVE
Ketones, ur: NEGATIVE mg/dL
Leukocytes,Ua: NEGATIVE
Nitrite: NEGATIVE
Protein, ur: NEGATIVE mg/dL
Specific Gravity, Urine: 1.008 (ref 1.005–1.030)
pH: 6 (ref 5.0–8.0)

## 2020-11-11 LAB — PROTEIN / CREATININE RATIO, URINE
Creatinine, Urine: 61.66 mg/dL
Protein Creatinine Ratio: 0.11 mg/mg{Cre} (ref 0.00–0.15)
Total Protein, Urine: 7 mg/dL

## 2020-11-11 LAB — CBC
HCT: 34 % — ABNORMAL LOW (ref 36.0–46.0)
Hemoglobin: 11.6 g/dL — ABNORMAL LOW (ref 12.0–15.0)
MCH: 31.2 pg (ref 26.0–34.0)
MCHC: 34.1 g/dL (ref 30.0–36.0)
MCV: 91.4 fL (ref 80.0–100.0)
Platelets: 208 10*3/uL (ref 150–400)
RBC: 3.72 MIL/uL — ABNORMAL LOW (ref 3.87–5.11)
RDW: 13.2 % (ref 11.5–15.5)
WBC: 11.1 10*3/uL — ABNORMAL HIGH (ref 4.0–10.5)
nRBC: 0 % (ref 0.0–0.2)

## 2020-11-11 NOTE — MAU Provider Note (Signed)
History     CSN: [id number]  Arrival date and time: 11/11/20 0535   None     Chief Complaint  Patient presents with   Emesis   Brianna Griffin is a 35 y.o. [id number] at [redacted]w[redacted]d who receives care at [facility].  She presents today for Emesis and HBP.  She reports she woke up at 0330 with vomiting.  She took her blood pressure and it was elevated.  She reports calling her primary office and was instructed to retake her blood pressure.  Despite it being normotensive she was advised to come in for evaluation.  Patient endorses fetal movement and denies vaginal concerns.  Patient also denies HA, visual disturbances, or RUQ pain.    OB History     Gravida  6   Para  2   Term  2   Preterm      AB  3   Living  2      SAB  3   IAB      Ectopic      Multiple  0   Live Births  2           Past Medical History:  Diagnosis Date   Allergy    Anxiety    Asthma    Depression    Gastritis    GERD (gastroesophageal reflux disease)    Hymenal remnant 07/19/2014   IBS (irritable bowel syndrome)    Migraine headache    Migraines    Miscarriage 09/09/2013   Pregnant 12/28/2014   Supervision of normal pregnancy in first trimester 03/04/2014   Supervision of other normal pregnancy 02/02/2015   Thyroid disease    hypothyroid   Vaginal discharge 01/31/2015    No past surgical history on file.  Family History  Problem Relation Age of Onset   Diabetes Maternal Grandmother    Hypertension Maternal Grandmother    Cancer Maternal Grandfather        bladder and liver   Crohn's disease Maternal Grandfather    Diabetes Father    Hypertension Father    Post-traumatic stress disorder Father    ADD / ADHD Sister    Depression Sister    ADD / ADHD Brother    Depression Brother     Social History   Tobacco Use   Smoking status: Never   Smokeless tobacco: Never  Vaping Use   Vaping Use: Never used  Substance Use Topics   Alcohol use: No    Alcohol/week: 0.0 standard drinks    Drug use: No    Allergies:  Allergies  Allergen Reactions   Ibuprofen Hives    Medications Prior to Admission  Medication Sig Dispense Refill Last Dose   albuterol (VENTOLIN HFA) 108 (90 Base) MCG/ACT inhaler INHALE 2 PUFFS EVERY 4 HOURS AS NEEDED FOR WHEEZING. 18 g 0    amoxicillin (AMOXIL) 500 MG capsule Take 1 capsule (500 mg total) by mouth 3 (three) times daily for 10 days. 30 capsule 0    buPROPion (WELLBUTRIN XL) 300 MG 24 hr tablet TAKE 1 TABLET BY MOUTH AT BEDTIME 90 tablet 1    Cetirizine HCl 10 MG CAPS Zyrtec prn      escitalopram (LEXAPRO) 20 MG tablet TAKE 1 TABLET BY MOUTH ONCE A DAY 90 tablet 1    fluticasone (FLONASE) 50 MCG/ACT nasal spray Place 2 sprays into both nostrils daily. 16 g 0    hydrOXYzine (ATARAX/VISTARIL) 10 MG tablet TAKE ONE TABLET BY MOUTH EVERY 8 HOURS AS  NEEDED FOR ANXIETY. 30 tablet 6    Misc. Devices (BREAST PUMP) MISC Double electric breast pump 1 each 0    ondansetron (ZOFRAN ODT) 4 MG disintegrating tablet Take 1 tablet (4 mg total) by mouth every 6 (six) hours as needed for nausea. 30 tablet 2    Prenatal MV-Min-Fe Fum-FA-DHA (PRENATAL 1 PO) Take by mouth.      trimethoprim-polymyxin b (POLYTRIM) ophthalmic solution Place 1 drop into the left eye every 4 (four) hours. For 7 days. 10 mL 0     Review of Systems  Constitutional:  Negative for chills and fever.  Eyes:  Negative for visual disturbance.  Respiratory:  Negative for cough and shortness of breath.   Gastrointestinal:  Negative for abdominal pain, nausea and vomiting.  Genitourinary:  Negative for difficulty urinating, dysuria, vaginal bleeding and vaginal discharge.  Neurological:  Negative for dizziness, light-headedness and headaches.  Physical Exam   Blood pressure 127/82, pulse (!) 104, temperature 97.9 F (36.6 C), resp. rate 17, height 5\' 4"  (1.626 m), weight 78.5 kg, last menstrual period 03/11/2020.  Vitals:   11/11/20 0646 11/11/20 0700  BP: 117/80 116/69  Pulse: 87  68  Resp: 20   Temp:    SpO2:  99%     Physical Exam Constitutional:      Appearance: Normal appearance.  HENT:     Head: Normocephalic and atraumatic.  Eyes:     Conjunctiva/sclera: Conjunctivae normal.  Cardiovascular:     Rate and Rhythm: Tachycardia present.  Pulmonary:     Effort: Pulmonary effort is normal. No respiratory distress.  Abdominal:     Comments: Gravid, Appears AGA  Musculoskeletal:     Cervical back: Normal range of motion.  Neurological:     Mental Status: She is alert and oriented to person, place, and time.  Psychiatric:        Mood and Affect: Mood normal.        Behavior: Behavior normal.        Thought Content: Thought content normal.    Fetal Assessment 145 bpm, Mod Var, -Decels, +Accels Toco: No ctx graphed  MAU Course   Results for orders placed or performed during the hospital encounter of 11/11/20 (from the past 24 hour(s))  Urinalysis, Routine w reflex microscopic Urine, Clean Catch     Status: None   Collection Time: 11/11/20  6:10 AM  Result Value Ref Range   Color, Urine YELLOW YELLOW   APPearance CLEAR CLEAR   Specific Gravity, Urine 1.008 1.005 - 1.030   pH 6.0 5.0 - 8.0   Glucose, UA NEGATIVE NEGATIVE mg/dL   Hgb urine dipstick NEGATIVE NEGATIVE   Bilirubin Urine NEGATIVE NEGATIVE   Ketones, ur NEGATIVE NEGATIVE mg/dL   Protein, ur NEGATIVE NEGATIVE mg/dL   Nitrite NEGATIVE NEGATIVE   Leukocytes,Ua NEGATIVE NEGATIVE  Protein / creatinine ratio, urine     Status: None   Collection Time: 11/11/20  6:10 AM  Result Value Ref Range   Creatinine, Urine 61.66 mg/dL   Total Protein, Urine 7 mg/dL   Protein Creatinine Ratio 0.11 0.00 - 0.15 mg/mg[Cre]  CBC     Status: Abnormal   Collection Time: 11/11/20  6:16 AM  Result Value Ref Range   WBC 11.1 (H) 4.0 - 10.5 K/uL   RBC 3.72 (L) 3.87 - 5.11 MIL/uL   Hemoglobin 11.6 (L) 12.0 - 15.0 g/dL   HCT 34.0 (L) 36.0 - 46.0 %   MCV 91.4 80.0 - 100.0 fL  MCH 31.2 26.0 - 34.0 pg    MCHC 34.1 30.0 - 36.0 g/dL   RDW 13.2 11.5 - 15.5 %   Platelets 208 150 - 400 K/uL   nRBC 0.0 0.0 - 0.2 %  Comprehensive metabolic panel     Status: Abnormal   Collection Time: 11/11/20  6:16 AM  Result Value Ref Range   Sodium 134 (L) 135 - 145 mmol/L   Potassium 3.5 3.5 - 5.1 mmol/L   Chloride 101 98 - 111 mmol/L   CO2 20 (L) 22 - 32 mmol/L   Glucose, Bld 99 70 - 99 mg/dL   BUN 7 6 - 20 mg/dL   Creatinine, Ser 0.58 0.44 - 1.00 mg/dL   Calcium 8.9 8.9 - 10.3 mg/dL   Total Protein 6.5 6.5 - 8.1 g/dL   Albumin 2.6 (L) 3.5 - 5.0 g/dL   AST 18 15 - 41 U/L   ALT 16 0 - 44 U/L   Alkaline Phosphatase 190 (H) 38 - 126 U/L   Total Bilirubin 0.6 0.3 - 1.2 mg/dL   GFR, Estimated >60 >60 mL/min   Anion gap 13 5 - 15   No results found.  MDM Physical Exam Labs: CBC, CMP, PC Ratio Measure BPQ15 min EFM Pain Management Assessment and Plan  35 year old [id number]  SIUP at [place] Elevated BP  -PIH labs ordered and collected. -Provider to bedside to discuss POC.  -Informed that it is not abnormal to have elevated blood pressure after stressful event/situation like vomiting. -Reassured that blood pressures are normotensive. -Will await labs. -NST reactive. Okay to discontinue fetal monitoring.    Maryann Conners MSN, CNM 11/11/2020, 6:11 AM   Reassessment (7:32 AM)  Results return with insignificant findings. BP remain normotensive Instructed to follow up with primary ob as scheduled. Discharged to home in stable condition.  Maryann Conners MSN, CNM Advanced Practice Provider, [facility]

## 2020-11-11 NOTE — MAU Note (Signed)
Pt report she woke up about 3:30 vomited. Took her b/p and it was 164/87. Called the after hours nurse and she told her tot ake it again and it was 134/82. Recommenced her to come in and get checked. Pt has been being treated for sinus infection with antibiotic for the past few days  Denies any pain / HA or visual changes . and repots good fetal movement.

## 2020-11-15 ENCOUNTER — Encounter: Payer: Self-pay | Admitting: *Deleted

## 2020-11-16 ENCOUNTER — Other Ambulatory Visit (HOSPITAL_COMMUNITY)
Admission: RE | Admit: 2020-11-16 | Discharge: 2020-11-16 | Disposition: A | Discharge status: Home / Self Care | Payer: No Typology Code available for payment source | Source: Ambulatory Visit | Attending: Obstetrics & Gynecology | Admitting: Obstetrics & Gynecology

## 2020-11-16 ENCOUNTER — Ambulatory Visit (INDEPENDENT_AMBULATORY_CARE_PROVIDER_SITE_OTHER): Payer: No Typology Code available for payment source | Admitting: Advanced Practice Midwife

## 2020-11-16 ENCOUNTER — Other Ambulatory Visit: Payer: Self-pay

## 2020-11-16 VITALS — BP 128/79 | HR 97 | Wt 179.5 lb

## 2020-11-16 DIAGNOSIS — Z3A36 36 weeks gestation of pregnancy: Secondary | ICD-10-CM | POA: Diagnosis not present

## 2020-11-16 DIAGNOSIS — Z3483 Encounter for supervision of other normal pregnancy, third trimester: Secondary | ICD-10-CM | POA: Insufficient documentation

## 2020-11-16 NOTE — Progress Notes (Signed)
   LOW-RISK PREGNANCY VISIT Patient name: Brianna Griffin MRN [id number]  Date of birth: Dec 30, 1985 Chief Complaint:   Routine Prenatal Visit  History of Present Illness:   Brianna Griffin is a 35 y.o. [id number] female at [redacted]w[redacted]d with an Estimated Date of Delivery: 12/16/20 being seen today for ongoing management of a low-risk pregnancy.  Today she reports no complaints. Contractions: Not present. Vag. Bleeding: None.  Movement: Present. denies leaking of fluid. Review of Systems:   Pertinent items are noted in HPI Denies abnormal vaginal discharge w/ itching/odor/irritation, headaches, visual changes, shortness of breath, chest pain, abdominal pain, severe nausea/vomiting, or problems with urination or bowel movements unless otherwise stated above. Pertinent History Reviewed:  Reviewed past medical,surgical, social, obstetrical and family history.  Reviewed problem list, medications and allergies. Physical Assessment:   Vitals:   11/16/20 1448  BP: 128/79  Pulse: 97  Weight: 179 lb 8 oz (81.4 kg)  Body mass index is 30.81 kg/m.        Physical Examination:   General appearance: Well appearing, and in no distress  Mental status: Alert, oriented to person, place, and time  Skin: Warm & dry  Cardiovascular: Normal heart rate noted  Respiratory: Normal respiratory effort, no distress  Abdomen: Soft, gravid, nontender  Pelvic: Cervical exam performed  Dilation: 2 Effacement (%): Thick Station: -2  Extremities: Edema: Trace  Fetal Status: Fetal Heart Rate (bpm): 141 Fundal Height: 34 cm Movement: Present Presentation: Vertex  Chaperone:  angel neas     No results found for this or any previous visit (from the past 24 hour(s)).  Assessment & Plan:  1) Low-risk pregnancy [id number] at [redacted]w[redacted]d with an Estimated Date of Delivery: 12/16/20      Meds: No orders of the defined types were placed in this encounter.  Labs/procedures today: GBS/GC/CHL  Plan:  Continue routine obstetrical care   Next visit: prefers in person    Reviewed: Term labor symptoms and general obstetric precautions including but not limited to vaginal bleeding, contractions, leaking of fluid and fetal movement were reviewed in detail with the patient.  All questions were answered. Has home bp cuff.. Check bp weekly, let us know if >140/90.   Follow-up: Return in about 1 week (around 11/23/2020) for [facility] w/CNM.  Orders Placed This Encounter  Procedures   Culture, beta strep (group b only)   Christin Fudge DNP, CNM 11/16/2020 3:10 PM

## 2020-11-16 NOTE — Patient Instructions (Signed)
  Cervical Ripening (to get your cervix ready for labor) : May try one or all:  Red Raspberry Leaf capsules:  two 300mg or 400mg tablets with each meal, 2-3 times a day  Potential Side Effects Of Raspberry Leaf:  Most women do not experience any side effects from drinking raspberry leaf tea. However, nausea and loose stools are possible    Evening Primrose Oil capsules: may take 1 to 3 capsules daily. May also prick one to release the oil and insert it into your vagina at night.  Some of the potential side effects:  Upset stomach  Loose stools or diarrhea  Headaches  Nausea  6 Dates a day (may taste better if warmed in microwave until soft). Found where raisins are in the grocery store  

## 2020-11-20 LAB — CULTURE, BETA STREP (GROUP B ONLY): Strep Gp B Culture: NEGATIVE

## 2020-11-21 LAB — CERVICOVAGINAL ANCILLARY ONLY
Chlamydia: NEGATIVE
Comment: NEGATIVE
Comment: NORMAL
Neisseria Gonorrhea: NEGATIVE

## 2020-11-23 ENCOUNTER — Ambulatory Visit (INDEPENDENT_AMBULATORY_CARE_PROVIDER_SITE_OTHER): Payer: No Typology Code available for payment source | Admitting: Women's Health

## 2020-11-23 ENCOUNTER — Encounter: Payer: Self-pay | Admitting: Women's Health

## 2020-11-23 ENCOUNTER — Other Ambulatory Visit: Payer: Self-pay

## 2020-11-23 VITALS — BP 115/77 | HR 81 | Wt 174.0 lb

## 2020-11-23 DIAGNOSIS — Z3A36 36 weeks gestation of pregnancy: Secondary | ICD-10-CM

## 2020-11-23 DIAGNOSIS — Z3483 Encounter for supervision of other normal pregnancy, third trimester: Secondary | ICD-10-CM

## 2020-11-23 DIAGNOSIS — Z3480 Encounter for supervision of other normal pregnancy, unspecified trimester: Secondary | ICD-10-CM

## 2020-11-23 NOTE — Progress Notes (Signed)
LOW-RISK PREGNANCY VISIT Patient name: Brianna Griffin MRN [id number]  Date of birth: [birth year] Chief Complaint:   Routine Prenatal Visit (Wants cervix check)  History of Present Illness:   Brianna Griffin is a 35 y.o. [id number] female at [redacted]w[redacted]d with an Estimated Date of Delivery: 12/16/20 being seen today for ongoing management of a low-risk pregnancy.   Today she reports no complaints. Wants waterbirth, attended class- will send screenshot via mychart of certificate today.  Contractions: Not present.  .  Movement: Present. denies leaking of fluid.  Depression screen [facility] 2/9 09/21/2020 06/29/2020 06/08/2020 12/27/2019 12/13/2019  Decreased Interest 0 0 0 0 0  Down, Depressed, Hopeless 1 0 0 0 0  PHQ - 2 Score 1 0 0 0 0  Altered sleeping 0 0 0 0 0  Tired, decreased energy 1 1 2 1  0  Change in appetite 0 1 2 1 1   Feeling bad or failure about yourself  2 0 - 0 0  Trouble concentrating 0 0 - 0 0  Moving slowly or fidgety/restless 0 0 - 0 0  Suicidal thoughts 0 0 - 0 0  PHQ-9 Score 4 2 4 2 1   Difficult doing work/chores - Not difficult at all - Not difficult at all Not difficult at all     GAD 7 : Generalized Anxiety Score 09/21/2020 06/29/2020 12/27/2019 12/13/2019  Nervous, Anxious, on Edge 2 1 1  0  Control/stop worrying 2 0 0 0  Worry too much - different things 2 1 0 0  Trouble relaxing 2 0 0 0  Restless 0 0 0 0  Easily annoyed or irritable 3 0 0 0  Afraid - awful might happen 2 0 1 0  Total GAD 7 Score 13 2 2  0  Anxiety Difficulty - Not difficult at all Not difficult at all Not difficult at all      Review of Systems:   Pertinent items are noted in HPI Denies abnormal vaginal discharge w/ itching/odor/irritation, headaches, visual changes, shortness of breath, chest pain, abdominal pain, severe nausea/vomiting, or problems with urination or bowel movements unless otherwise stated above. Pertinent History Reviewed:  Reviewed past medical,surgical, social, obstetrical and family history.   Reviewed problem list, medications and allergies. Physical Assessment:   Vitals:   11/23/20 1602  BP: 115/77  Pulse: 81  Weight: 174 lb (78.9 kg)  Body mass index is 29.87 kg/m.        Physical Examination:   General appearance: Well appearing, and in no distress  Mental status: Alert, oriented to person, place, and time  Skin: Warm & dry  Cardiovascular: Normal heart rate noted  Respiratory: Normal respiratory effort, no distress  Abdomen: Soft, gravid, nontender  Pelvic: Cervical exam performed  Dilation: 3 Effacement (%): Thick Station: -2  Extremities: Edema: None  Fetal Status: Fetal Heart Rate (bpm): 135 Fundal Height: 34 cm Movement: Present Presentation: Vertex  Chaperone: Latisha Cresenzo   No results found for this or any previous visit (from the past 24 hour(s)).  Assessment & Plan:  1) Low-risk pregnancy [id number] at [redacted]w[redacted]d with an Estimated Date of Delivery: 12/16/20   2) Plans waterbirth, will send screenshot of certificate via mychart today. Reviewed risks/benefits, signed consent today. Has doula 'Angie'.    Meds: No orders of the defined types were placed in this encounter.  Labs/procedures today: SVE  Plan:  Continue routine obstetrical care  Next visit: prefers in person    Reviewed: Preterm labor symptoms and general obstetric precautions including but  not limited to vaginal bleeding, contractions, leaking of fluid and fetal movement were reviewed in detail with the patient.  All questions were answered. Does have home bp cuff. Office bp cuff given: not applicable. Check bp weekly, let us know if consistently >140 and/or >90.  Follow-up: Return in about 1 week (around 11/30/2020) for [place], [place], in person.  Future Appointments  Date Time Provider [place]  11/30/2020  2:50 PM Cresenzo-Dishmon, Joaquim Lai, CNM [facility]    No orders of the defined types were placed in this encounter.  [place], [facility] 11/23/2020 4:34 PM

## 2020-11-23 NOTE — Patient Instructions (Signed)
Brianna Griffin, thank you for choosing our office today! We appreciate the opportunity to meet your healthcare needs. You may receive a short survey by mail, e-mail, or through [employer]. If you are happy with your care we would appreciate if you could take just a few minutes to complete the survey questions. We read all of your comments and take your feedback very seriously. Thank you again for choosing our office.  Center for Brianna [facility] at [facility] at [facility] ([place], [place] [address]) Entrance C, located off of [place] parking   CLASSES: Go to [email or web address] to register for classes (childbirth, breastfeeding, waterbirth, infant CPR, daddy bootcamp, etc.)  Call the office [phone]) or go to [facility] if: You begin to have strong, frequent contractions Your water breaks.  Sometimes it is a big gush of fluid, sometimes it is just a trickle that keeps getting your panties wet or running down your legs You have vaginal bleeding.  It is normal to have a small amount of spotting if your cervix was checked.  You don't feel your baby moving like normal.  If you don't, get you something to eat and drink and lay down and focus on feeling your baby move.   If your baby is still not moving like normal, you should call the office or go to [facility].  Call the office [phone]) or go to [facility] hospital for these signs of pre-eclampsia: Severe headache that does not go away with Tylenol Visual changes- seeing spots, double, blurred vision Pain under your right breast or upper abdomen that does not go away with Tums or heartburn medicine Nausea and/or vomiting Severe swelling in your hands, feet, and face   [facility] Pediatricians/Family Doctors [facility]): [address] [place], [place]: [address] [place], [place] [facility]): [place], [phone] (call to ask if accepting patients) [facility] Department: [address] [place], [place] [facility]): [address], [place] [facility]: [address] [place], [place] [facility] [place]: [place], [place] [facility]): [phone] [facility]: [address], [place]: [address] [place] [facility]: [phone], [phone]  Home Blood Pressure Monitoring for Patients   Your provider has recommended that you check your blood pressure (BP) at least once a week at home. If you do not have a blood pressure cuff at home, one will be provided for you. Contact your provider if you have not received your monitor within 1 week.   Helpful Tips for Accurate Home Blood Pressure Checks  Don't smoke, exercise, or drink caffeine 30 minutes before checking your BP Use the restroom before checking your BP (a full bladder can raise your pressure) Relax in a comfortable upright chair Feet on the ground Left arm resting comfortably on a flat surface at the level of your heart Legs uncrossed Back supported Sit quietly and don't talk Place the cuff on your bare arm Adjust snuggly, so that only two fingertips  can fit between your skin and the top of the cuff Check 2 readings separated by at least one minute Keep a log of your BP readings For a visual, please reference this diagram: http://ccnc.care/bpdiagram  Provider Name: [facility]     Phone: [phone]  Zone 1: ALL CLEAR  Continue to monitor your symptoms:  BP reading is less than 140 (top number) or less than 90 (bottom number)  No right  upper stomach pain No headaches or seeing spots No feeling nauseated or throwing up No swelling in face and hands  Zone 2: CAUTION Call your doctor's office for any of the following:  BP reading is greater than 140 (top number) or greater than 90 (bottom number)  Stomach pain under your ribs in the middle or right side Headaches or seeing spots Feeling nauseated or throwing up Swelling in face and hands  Zone 3: EMERGENCY  Seek immediate medical care if you have any of the following:  BP reading is greater than160 (top number) or greater than 110 (bottom number) Severe headaches not improving with Tylenol Serious difficulty catching your breath Any worsening symptoms from Zone 2   Braxton Hicks Contractions Contractions of the uterus can occur throughout pregnancy, but they are not always a sign that you are in labor. You may have practice contractions called Braxton Hicks contractions. These false labor contractions are sometimes confused with true labor. What are [place] contractions? Braxton Hicks contractions are tightening movements that occur in the muscles of the uterus before labor. Unlike true labor contractions, these contractions do not result in opening (dilation) and thinning of the cervix. Toward the end of pregnancy (32-34 weeks), Braxton Hicks contractions can happen more often and may become stronger. These contractions are sometimes difficult to tell apart from true labor because they can be very uncomfortable. You should not feel embarrassed if you go to the hospital with false labor. Sometimes, the only way to tell if you are in true labor is for your health care provider to look for changes in the cervix. The health care provider will do a physical exam and may monitor your contractions. If you are not in true labor, the exam should show that your cervix is not dilating and your water has not broken. If there are no other health problems associated with your  pregnancy, it is completely safe for you to be sent home with false labor. You may continue to have Braxton Hicks contractions until you go into true labor. How to tell the difference between true labor and false labor True labor Contractions last 30-70 seconds. Contractions become very regular. Discomfort is usually felt in the top of the uterus, and it spreads to the lower abdomen and low back. Contractions do not go away with walking. Contractions usually become more intense and increase in frequency. The cervix dilates and gets thinner. False labor Contractions are usually shorter and not as strong as true labor contractions. Contractions are usually irregular. Contractions are often felt in the front of the lower abdomen and in the groin. Contractions may go away when you walk around or change positions while lying down. Contractions get weaker and are shorter-lasting as time goes on. The cervix usually does not dilate or become thin. Follow these instructions at home:  Take over-the-counter and prescription medicines only as told by your health care provider. Keep up with your usual exercises and follow other instructions from your health care provider. Eat and drink lightly if you think  you are going into labor. If Braxton Hicks contractions are making you uncomfortable: Change your position from lying down or resting to walking, or change from walking to resting. Sit and rest in a tub of warm water. Drink enough fluid to keep your urine pale yellow. Dehydration may cause these contractions. Do slow and deep breathing several times an hour. Keep all follow-up prenatal visits as told by your health care provider. This is important. Contact a health care provider if: You have a fever. You have continuous pain in your abdomen. Get help right away if: Your contractions become stronger, more regular, and closer together. You have fluid leaking or gushing from your vagina. You pass  blood-tinged mucus (bloody show). You have bleeding from your vagina. You have low back pain that you never had before. You feel your baby's head pushing down and causing pelvic pressure. Your baby is not moving inside you as much as it used to. Summary Contractions that occur before labor are called Braxton Hicks contractions, false labor, or practice contractions. Braxton Hicks contractions are usually shorter, weaker, farther apart, and less regular than true labor contractions. True labor contractions usually become progressively stronger and regular, and they become more frequent. Manage discomfort from [facility] contractions by changing position, resting in a warm bath, drinking plenty of water, or practicing deep breathing. This information is not intended to replace advice given to you by your health care provider. Make sure you discuss any questions you have with your health care provider. Document Revised: 04/18/2017 Document Reviewed: 09/19/2016 Elsevier Patient Education  [place].

## 2020-11-29 ENCOUNTER — Other Ambulatory Visit (HOSPITAL_COMMUNITY): Payer: Self-pay

## 2020-11-30 ENCOUNTER — Encounter: Payer: Self-pay | Admitting: Advanced Practice Midwife

## 2020-11-30 ENCOUNTER — Ambulatory Visit (INDEPENDENT_AMBULATORY_CARE_PROVIDER_SITE_OTHER): Payer: No Typology Code available for payment source | Admitting: Advanced Practice Midwife

## 2020-11-30 ENCOUNTER — Other Ambulatory Visit: Payer: Self-pay

## 2020-11-30 VITALS — BP 128/80 | HR 79 | Wt 173.0 lb

## 2020-11-30 DIAGNOSIS — Z3483 Encounter for supervision of other normal pregnancy, third trimester: Secondary | ICD-10-CM

## 2020-11-30 DIAGNOSIS — O26843 Uterine size-date discrepancy, third trimester: Secondary | ICD-10-CM

## 2020-11-30 DIAGNOSIS — Z3A37 37 weeks gestation of pregnancy: Secondary | ICD-10-CM

## 2020-11-30 NOTE — Progress Notes (Signed)
   LOW-RISK PREGNANCY VISIT Patient name: Brianna Griffin MRN [id number]  Date of birth: November 25, 1985 Chief Complaint:   Routine Prenatal Visit  History of Present Illness:   Brianna Griffin is a 35 y.o. [id number] female at [redacted]w[redacted]d with an Estimated Date of Delivery: 12/16/20 being seen today for ongoing management of a low-risk pregnancy.  Today she reports no complaints. Contractions: Not present.  .  Movement: Present. denies leaking of fluid. Review of Systems:   Pertinent items are noted in HPI Denies abnormal vaginal discharge w/ itching/odor/irritation, headaches, visual changes, shortness of breath, chest pain, abdominal pain, severe nausea/vomiting, or problems with urination or bowel movements unless otherwise stated above. Pertinent History Reviewed:  Reviewed past medical,surgical, social, obstetrical and family history.  Reviewed problem list, medications and allergies. Physical Assessment:   Vitals:   11/30/20 1510  BP: 128/80  Pulse: 79  Weight: 173 lb (78.5 kg)  Body mass index is 29.7 kg/m.        Physical Examination:   General appearance: Well appearing, and in no distress  Mental status: Alert, oriented to person, place, and time  Skin: Warm & dry  Cardiovascular: Normal heart rate noted  Respiratory: Normal respiratory effort, no distress  Abdomen: Soft, gravid, nontender  Pelvic: Cervical exam performed  Dilation: 4.5 Effacement (%): 50 Station: -2  Extremities: Edema: None  Fetal Status: Fetal Heart Rate (bpm): 135 Fundal Height: 33 cm Movement: Present Presentation: Vertex  Chaperone: Peggy Dones    No results found for this or any previous visit (from the past 24 hour(s)).  Assessment & Plan:  1) Low-risk pregnancy [id number] at [redacted]w[redacted]d with an Estimated Date of Delivery: 12/16/20  2)  size<dates:  [place] for EFW/AFI   Meds: No orders of the defined types were placed in this encounter.  Labs/procedures today: none  Plan:  Continue routine obstetrical care  Next  visit: prefers in person    Reviewed: Term labor symptoms and general obstetric precautions including but not limited to vaginal bleeding, contractions, leaking of fluid and fetal movement were reviewed in detail with the patient.  All questions were answered. Has home bp cuff.. Check bp weekly, let us know if >140/90.   Follow-up: Return in about 1 week (around 12/07/2020) for [place]; also asap for EFW/AFT [place].  Orders Placed This Encounter  Procedures   US OB Follow Up    Christin Fudge DNP, CNM 11/30/2020 3:44 PM

## 2020-11-30 NOTE — Patient Instructions (Signed)

## 2020-12-06 ENCOUNTER — Encounter (HOSPITAL_COMMUNITY): Payer: Self-pay | Admitting: Obstetrics and Gynecology

## 2020-12-06 ENCOUNTER — Other Ambulatory Visit: Payer: Self-pay

## 2020-12-06 ENCOUNTER — Inpatient Hospital Stay (HOSPITAL_COMMUNITY)
Admission: AD | Admit: 2020-12-06 | Discharge: 2020-12-07 | DRG: 807 | Disposition: A | Discharge status: Home / Self Care | Payer: No Typology Code available for payment source | Attending: Obstetrics and Gynecology | Admitting: Obstetrics and Gynecology

## 2020-12-06 DIAGNOSIS — R030 Elevated blood-pressure reading, without diagnosis of hypertension: Secondary | ICD-10-CM | POA: Diagnosis present

## 2020-12-06 DIAGNOSIS — F32A Depression, unspecified: Secondary | ICD-10-CM | POA: Diagnosis present

## 2020-12-06 DIAGNOSIS — Z20822 Contact with and (suspected) exposure to covid-19: Secondary | ICD-10-CM | POA: Diagnosis present

## 2020-12-06 DIAGNOSIS — O99344 Other mental disorders complicating childbirth: Secondary | ICD-10-CM | POA: Diagnosis present

## 2020-12-06 DIAGNOSIS — O9952 Diseases of the respiratory system complicating childbirth: Secondary | ICD-10-CM | POA: Diagnosis present

## 2020-12-06 DIAGNOSIS — O26893 Other specified pregnancy related conditions, third trimester: Principal | ICD-10-CM | POA: Diagnosis present

## 2020-12-06 DIAGNOSIS — F418 Other specified anxiety disorders: Secondary | ICD-10-CM | POA: Diagnosis present

## 2020-12-06 DIAGNOSIS — J45909 Unspecified asthma, uncomplicated: Secondary | ICD-10-CM | POA: Diagnosis present

## 2020-12-06 DIAGNOSIS — F419 Anxiety disorder, unspecified: Secondary | ICD-10-CM | POA: Diagnosis present

## 2020-12-06 DIAGNOSIS — Z3490 Encounter for supervision of normal pregnancy, unspecified, unspecified trimester: Secondary | ICD-10-CM

## 2020-12-06 DIAGNOSIS — Z3A38 38 weeks gestation of pregnancy: Secondary | ICD-10-CM

## 2020-12-06 LAB — CBC
HCT: 35.4 % — ABNORMAL LOW (ref 36.0–46.0)
Hemoglobin: 12.4 g/dL (ref 12.0–15.0)
MCH: 31.4 pg (ref 26.0–34.0)
MCHC: 35 g/dL (ref 30.0–36.0)
MCV: 89.6 fL (ref 80.0–100.0)
Platelets: 198 10*3/uL (ref 150–400)
RBC: 3.95 MIL/uL (ref 3.87–5.11)
RDW: 13.3 % (ref 11.5–15.5)
WBC: 13.2 10*3/uL — ABNORMAL HIGH (ref 4.0–10.5)
nRBC: 0 % (ref 0.0–0.2)

## 2020-12-06 LAB — COMPREHENSIVE METABOLIC PANEL
ALT: 17 U/L (ref 0–44)
AST: 18 U/L (ref 15–41)
Albumin: 2.9 g/dL — ABNORMAL LOW (ref 3.5–5.0)
Alkaline Phosphatase: 242 U/L — ABNORMAL HIGH (ref 38–126)
Anion gap: 11 (ref 5–15)
BUN: 7 mg/dL (ref 6–20)
CO2: 20 mmol/L — ABNORMAL LOW (ref 22–32)
Calcium: 9.3 mg/dL (ref 8.9–10.3)
Chloride: 103 mmol/L (ref 98–111)
Creatinine, Ser: 0.64 mg/dL (ref 0.44–1.00)
GFR, Estimated: >60 mL/min (ref >60)
Glucose, Bld: 101 mg/dL — ABNORMAL HIGH (ref 70–99)
Potassium: 3.4 mmol/L — ABNORMAL LOW (ref 3.5–5.1)
Sodium: 134 mmol/L — ABNORMAL LOW (ref 135–145)
Total Bilirubin: 0.8 mg/dL (ref 0.3–1.2)
Total Protein: 6.8 g/dL (ref 6.5–8.1)

## 2020-12-06 LAB — RESP PANEL BY RT-PCR (FLU A&B, COVID) ARPGX2
Influenza A by PCR: NEGATIVE
Influenza B by PCR: NEGATIVE
SARS Coronavirus 2 by RT PCR: NEGATIVE

## 2020-12-06 LAB — TYPE AND SCREEN
ABO/RH(D): B POS
Antibody Screen: NEGATIVE

## 2020-12-06 LAB — PROTEIN / CREATININE RATIO, URINE
Creatinine, Urine: 19.86 mg/dL
Total Protein, Urine: <6 mg/dL

## 2020-12-06 LAB — RPR: RPR Ser Ql: NONREACTIVE

## 2020-12-06 MED ORDER — ACETAMINOPHEN 325 MG PO TABS: 650.0000 mg | ORAL_TABLET | ORAL | Status: DC | PRN

## 2020-12-06 MED ORDER — BENZOCAINE-MENTHOL 20-0.5 % EX AERO: 1.0000 "application " | INHALATION_SPRAY | CUTANEOUS | Status: DC | PRN

## 2020-12-06 MED ORDER — ONDANSETRON HCL 4 MG/2ML IJ SOLN: 4.0000 mg | INTRAMUSCULAR | Status: DC | PRN

## 2020-12-06 MED ORDER — LIDOCAINE HCL (PF) 1 % IJ SOLN: 30.0000 mL | INTRAMUSCULAR | Status: DC | PRN

## 2020-12-06 MED ORDER — OXYCODONE-ACETAMINOPHEN 5-325 MG PO TABS: 1.0000 | ORAL_TABLET | ORAL | Status: DC | PRN

## 2020-12-06 MED ORDER — LACTATED RINGERS IV SOLN: 500.0000 mL | INTRAVENOUS | Status: DC | PRN

## 2020-12-06 MED ORDER — SIMETHICONE 80 MG PO CHEW: 80.0000 mg | CHEWABLE_TABLET | ORAL | Status: DC | PRN

## 2020-12-06 MED ORDER — DIPHENHYDRAMINE HCL 25 MG PO CAPS: 25.0000 mg | ORAL_CAPSULE | Freq: Four times a day (QID) | ORAL | Status: DC | PRN

## 2020-12-06 MED ORDER — POLYETHYLENE GLYCOL 3350 17 G PO PACK
17.0000 g | PACK | Freq: Every day | ORAL | Status: DC
  Administered 2020-12-06: 17 g via ORAL
  Filled 2020-12-06: qty 1

## 2020-12-06 MED ORDER — OXYTOCIN-SODIUM CHLORIDE 30-0.9 UT/500ML-% IV SOLN
2.5000 [IU]/h | INTRAVENOUS | Status: DC
  Filled 2020-12-06: qty 500

## 2020-12-06 MED ORDER — WITCH HAZEL-GLYCERIN EX PADS: 1.0000 "application " | MEDICATED_PAD | CUTANEOUS | Status: DC | PRN

## 2020-12-06 MED ORDER — SENNOSIDES-DOCUSATE SODIUM 8.6-50 MG PO TABS
2.0000 | ORAL_TABLET | ORAL | Status: DC
  Filled 2020-12-06: qty 2

## 2020-12-06 MED ORDER — TETANUS-DIPHTH-ACELL PERTUSSIS 5-2.5-18.5 LF-MCG/0.5 IM SUSY: 0.5000 mL | PREFILLED_SYRINGE | Freq: Once | INTRAMUSCULAR | Status: DC

## 2020-12-06 MED ORDER — ACETAMINOPHEN 325 MG PO TABS
650.0000 mg | ORAL_TABLET | ORAL | Status: DC | PRN
  Administered 2020-12-06 – 2020-12-07 (×2): 650 mg via ORAL
  Filled 2020-12-06 (×2): qty 2

## 2020-12-06 MED ORDER — MEDROXYPROGESTERONE ACETATE 150 MG/ML IM SUSP: 150.0000 mg | INTRAMUSCULAR | Status: DC | PRN

## 2020-12-06 MED ORDER — MEASLES, MUMPS & RUBELLA VAC IJ SOLR: 0.5000 mL | Freq: Once | INTRAMUSCULAR | Status: DC

## 2020-12-06 MED ORDER — PRENATAL MULTIVITAMIN CH
1.0000 | ORAL_TABLET | Freq: Every day | ORAL | Status: DC
  Administered 2020-12-06 – 2020-12-07 (×2): 1 via ORAL
  Filled 2020-12-06 (×2): qty 1

## 2020-12-06 MED ORDER — ESCITALOPRAM OXALATE 20 MG PO TABS
20.0000 mg | ORAL_TABLET | Freq: Every day | ORAL | Status: DC
  Administered 2020-12-07: 20 mg via ORAL
  Filled 2020-12-06: qty 1

## 2020-12-06 MED ORDER — SOD CITRATE-CITRIC ACID 500-334 MG/5ML PO SOLN: 30.0000 mL | ORAL | Status: DC | PRN

## 2020-12-06 MED ORDER — LACTATED RINGERS IV SOLN: INTRAVENOUS | Status: DC

## 2020-12-06 MED ORDER — ONDANSETRON HCL 4 MG/2ML IJ SOLN
4.0000 mg | Freq: Four times a day (QID) | INTRAMUSCULAR | Status: DC | PRN
  Administered 2020-12-06: 4 mg via INTRAVENOUS
  Filled 2020-12-06: qty 2

## 2020-12-06 MED ORDER — OXYCODONE-ACETAMINOPHEN 5-325 MG PO TABS
2.0000 | ORAL_TABLET | ORAL | Status: DC | PRN
Start: 2020-12-06 — End: 2020-12-06

## 2020-12-06 MED ORDER — COCONUT OIL OIL
1.0000 "application " | TOPICAL_OIL | Status: DC | PRN
  Administered 2020-12-07: 1 via TOPICAL

## 2020-12-06 MED ORDER — ONDANSETRON HCL 4 MG PO TABS: 4.0000 mg | ORAL_TABLET | ORAL | Status: DC | PRN

## 2020-12-06 MED ORDER — TRANEXAMIC ACID-NACL 1000-0.7 MG/100ML-% IV SOLN
1000.0000 mg | INTRAVENOUS | Status: AC
  Administered 2020-12-06: 1000 mg via INTRAVENOUS
  Filled 2020-12-06: qty 100

## 2020-12-06 MED ORDER — DIBUCAINE (PERIANAL) 1 % EX OINT: 1.0000 "application " | TOPICAL_OINTMENT | CUTANEOUS | Status: DC | PRN

## 2020-12-06 MED ORDER — OXYTOCIN BOLUS FROM INFUSION
333.0000 mL | Freq: Once | INTRAVENOUS | Status: AC
  Administered 2020-12-06: 333 mL via INTRAVENOUS

## 2020-12-06 MED ORDER — BUPROPION HCL ER (XL) 300 MG PO TB24
300.0000 mg | ORAL_TABLET | Freq: Every day | ORAL | Status: DC
  Administered 2020-12-06: 300 mg via ORAL
  Filled 2020-12-06 (×4): qty 1

## 2020-12-06 NOTE — Lactation Note (Signed)
Lactation Consultation Note  Patient Name: Brianna Griffin [id number] Date: 12/06/2020 Reason for consult: L&D Initial assessment Age:35 y.o.   Initial L&D Consult:  Mother has a doula present and does not wish to see lactation at this time; will follow up on the M/B unit.   Maternal Data    Feeding    LATCH Score                    Lactation Tools Discussed/Used    Interventions    Discharge    Consult Status Consult Status: Follow-up Date: 12/06/20 Follow-up type: In-patient    [name] R [name] 12/06/2020, 5:46 AM

## 2020-12-06 NOTE — Social Work (Signed)
MOB was referred for history of depression and anxiety.   * Referral screened out by Clinical Social Worker because none of the following criteria appear to apply:  ~ History of anxiety/depression during this pregnancy, or of post-partum depression following prior delivery. ~ Diagnosis of anxiety and/or depression within last 3 years. OR * MOB's symptoms currently being treated with medication and/or therapy. CSW reviewed chart and notes MOB currently being treated with Wellbutrin 300mg  and Lexapro 20mg .   Please contact the Clinical Social Worker if needs arise, by [facility] request, or if MOB scores greater than 9/yes to question 10 on Edinburgh Postpartum Depression Screen.  Darra Lis, [place] Work [employer]  [phone]

## 2020-12-06 NOTE — Lactation Note (Signed)
This note was copied from a baby's chart. Lactation Consultation Note  Patient Name: Brianna Griffin [id number] Date: 12/06/2020 Reason for consult: Initial assessment Age:35 hours  P3, Baby sleeping. Ex BF.  Mother states baby has latched x 2 since birth.  Denies questions or concerns. Feed on demand with cues.  Goal 8-12+ times per day after first 24 hrs.  Place baby STS if not cueing.  Mom made aware of O/P services, breastfeeding support groups, and our phone # for post-discharge questions.    Maternal Data Has patient been taught Hand Expression?: Yes Does the patient have breastfeeding experience prior to this delivery?: Yes How long did the patient breastfeed?: 2 years  Feeding Mother's Current Feeding Choice: Breast Milk  Interventions Interventions: Breast feeding basics reviewed     Consult Status Consult Status: Follow-up Date: 12/07/20 Follow-up type: In-patient    Carlye Grippe RN [facility] 12/06/2020, 9:44 AM

## 2020-12-06 NOTE — MAU Note (Signed)
Pt presents to mau c/o CTX every 2 mi, increasing in intensity since 9 pm.  No LOF, pt reports bloody show.  Pt endorses +FM.  No complications with the pregnancy per pt.

## 2020-12-06 NOTE — Discharge Summary (Addendum)
Postpartum Discharge Summary     Patient Name: Brianna Griffin DOB: [birth year] MRN: [id number]  Date of admission: 12/06/2020 Delivery date:12/06/2020  Delivering provider: Arrie Senate  Date of discharge: 12/07/2020  Admitting diagnosis: Preterm labor [O60.00] Intrauterine pregnancy: [redacted]w[redacted]d    Secondary diagnosis:  Active Problems:   Asthma, chronic   Depression with anxiety   Elevated BP without diagnosis of hypertension   Encounter for supervision of normal pregnancy, antepartum   Preterm labor  Additional problems: none    Discharge diagnosis: Term Pregnancy Delivered                                              Post partum procedures: none Augmentation: N/A Complications: None  Hospital course: Onset of Labor With Vaginal Delivery      35y.o. yo [id number] 35w4das admitted in Active Labor on 12/06/2020. Patient had an uncomplicated labor course as follows:  Membrane Rupture Time/Date: 4:43 AM ,12/06/2020   Delivery Method:Vaginal, Spontaneous  Episiotomy: None  Lacerations:  None  Patient had an uncomplicated postpartum course.  She is ambulating, tolerating a regular diet, passing flatus, and urinating well.  Her BPs have been normotensive during her PP stay. Patient is discharged home in stable condition on 12/07/20 per her request for early d/c.  She desired a circ for her son- consented and note placed on infant's chart.  Newborn Data: Birth date:12/06/2020  Birth time:4:44 AM  Gender:Female  Living status:Living  Apgars:9 ,9  Weight:2780 g (6lb 2.1oz)  Magnesium Sulfate received: No BMZ received: No Rhophylac:N/A MMR:N/A T-DaP:Given prenatally Flu: Yes Transfusion:No  Physical exam  Vitals:   12/06/20 1332 12/06/20 1726 12/06/20 2114 12/07/20 0554  BP: 111/81 116/79 123/83 116/75  Pulse: 86 90 90 82  Resp: 17 18 16 18   Temp: 98.9 F (37.2 C) 97.8 F (36.6 C) 98.4 F (36.9 C) 98 F (36.7 C)  TempSrc: Oral Oral Oral Oral  SpO2:   99%    Weight:      Height:       General: alert and cooperative Lochia: appropriate Uterine Fundus: firm Incision: N/A DVT Evaluation: No evidence of DVT seen on physical exam. Labs: Lab Results  Component Value Date   WBC 13.2 (H) 12/06/2020   HGB 12.4 12/06/2020   HCT 35.4 (L) 12/06/2020   MCV 89.6 12/06/2020   PLT 198 12/06/2020   CMP Latest Ref Rng & Units 12/06/2020  Glucose 70 - 99 mg/dL 101(H)  BUN 6 - 20 mg/dL 7  Creatinine 0.44 - 1.00 mg/dL 0.64  Sodium 135 - 145 mmol/L 134(L)  Potassium 3.5 - 5.1 mmol/L 3.4(L)  Chloride 98 - 111 mmol/L 103  CO2 22 - 32 mmol/L 20(L)  Calcium 8.9 - 10.3 mg/dL 9.3  Total Protein 6.5 - 8.1 g/dL 6.8  Total Bilirubin 0.3 - 1.2 mg/dL 0.8  Alkaline Phos 38 - 126 U/L 242(H)  AST 15 - 41 U/L 18  ALT 0 - 44 U/L 17   Edinburgh Score: Edinburgh Postnatal Depression Scale Screening Tool 12/06/2020  I have been able to laugh and see the funny side of things. 0  I have looked forward with enjoyment to things. 0  I have blamed myself unnecessarily when things went wrong. 2  I have been anxious or worried for no good reason. 0  I have felt scared  or panicky for no good reason. 1  Things have been getting on top of me. 2  I have been so unhappy that I have had difficulty sleeping. 0  I have felt sad or miserable. 0  I have been so unhappy that I have been crying. 0  The thought of harming myself has occurred to me. 0  Edinburgh Postnatal Depression Scale Total 5     After visit meds:  Allergies as of 12/07/2020       Reactions   Ibuprofen Hives        Medication List     STOP taking these medications    albuterol 108 (90 Base) MCG/ACT inhaler Commonly known as: Ventolin HFA   Breast Pump Misc   Cetirizine HCl 10 MG Caps   fluticasone 50 MCG/ACT nasal spray Commonly known as: FLONASE   hydrOXYzine 10 MG tablet Commonly known as: ATARAX/VISTARIL   ondansetron 4 MG disintegrating tablet Commonly known as: Zofran ODT        TAKE these medications    acetaminophen 325 MG tablet Commonly known as: Tylenol Take 2 tablets (650 mg total) by mouth every 4 (four) hours as needed (for pain scale < 4).   buPROPion 300 MG 24 hr tablet Commonly known as: WELLBUTRIN XL TAKE 1 TABLET BY MOUTH AT BEDTIME   escitalopram 20 MG tablet Commonly known as: LEXAPRO TAKE 1 TABLET BY MOUTH ONCE A DAY   PRENATAL 1 PO Take by mouth.         Discharge home in stable condition Infant Feeding: Breast Infant Disposition:home with mother Discharge instruction: per After Visit Summary and Postpartum booklet. Activity: Advance as tolerated. Pelvic rest for 6 weeks.  Diet: routine diet Future Appointments: Future Appointments  Date Time Provider Viola  12/13/2020 10:10 AM [facility] NURSE [facility]  01/10/2021 11:30 AM Cresenzo-Dishmon, Joaquim Lai, CNM [facility]   Follow up Visit: Message sent to [facility] 12/06/20 by Sylvester Harder.   Please schedule this patient for a In person postpartum visit in 6 weeks with the following provider: Any provider. Additional Postpartum F/U:BP check 1 week  High risk pregnancy complicated by: HTN Delivery mode:  Vaginal, Spontaneous  Anticipated Birth Control:   prefers Depo at office   12/07/2020 Myrtis Ser, CNM 9:43 AM

## 2020-12-06 NOTE — H&P (Signed)
OBSTETRIC ADMISSION HISTORY AND PHYSICAL  Brianna Griffin is a 35 y.o. female [phone] with IUP at 34w4dby LMP presenting for SOL. She reports +FMs, No LOF, no VB, no blurry vision, headaches or peripheral edema, and RUQ pain.  She plans on breast and bottle feeding. She request depo for birth control. 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 No pertinent surgical history.  Obstetrical History: OB History     Gravida  6   Para  2   Term  2   Preterm      AB  3   Living  2      SAB  3   IAB      Ectopic      Multiple  0   Live Births  2           Social History Social History   Socioeconomic History   Marital status: Married    Spouse name: TCarlos Quackenbush  Number of children: Not on file   Years of education: Not on file   Highest education level: Not on file  Occupational History   Not on file  Tobacco Use   Smoking status: Never   Smokeless tobacco: Never  Vaping Use   Vaping Use: Never used  Substance and Sexual Activity   Alcohol use: No    Alcohol/week: 0.0 standard drinks   Drug use: No   Sexual activity: Yes    Birth control/protection: None  Other Topics  Concern   Not on file  Social History Narrative   Not on file   Social Determinants of Health   Financial Resource Strain: Low Risk    Difficulty of Paying Living Expenses: Not hard at all  Food Insecurity: No Food Insecurity   Worried About RCharity fundraiserin the Last Year: Never true   RClintonin the Last Year: Never true  Transportation Needs: No Transportation Needs   Lack of Transportation (Medical): No   Lack of Transportation (Non-Medical): No  Physical Activity: Inactive   Days of Exercise per Week: 0 days   Minutes of Exercise per Session: 0 min  Stress: Stress Concern Present   Feeling of Stress : Rather much  Social Connections: Socially Integrated   Frequency of Communication with Friends and Family: More than three times a week   Frequency of Social Gatherings with Friends and Family: Once a week   Attends Religious Services: More than 4 times per year   Active Member of [employer] Organizations: Yes   Attends CArchivistMeetings: 1 to 4 times per year   Marital Status: Married  Family History: Family History  Problem Relation Age of Onset   Diabetes Maternal Grandmother    Hypertension Maternal Grandmother    Cancer Maternal Grandfather        bladder and liver   Crohn's disease Maternal Grandfather    Diabetes Father    Hypertension Father    Post-traumatic stress disorder Father    ADD / ADHD Sister    Depression Sister    ADD / ADHD Brother    Depression Brother     Allergies: Allergies  Allergen Reactions   Ibuprofen Hives    Medications Prior to Admission  Medication Sig Dispense Refill Last Dose   buPROPion (WELLBUTRIN XL) 300 MG 24 hr tablet TAKE 1 TABLET BY MOUTH AT BEDTIME 90 tablet 1 12/05/2020   escitalopram (LEXAPRO) 20 MG tablet TAKE 1 TABLET BY MOUTH ONCE A DAY 90 tablet 1 12/05/2020   ondansetron (ZOFRAN ODT) 4 MG disintegrating tablet Take 1 tablet (4 mg total) by mouth every 6 (six) hours as needed for nausea.  30 tablet 2 Past Month   Prenatal MV-Min-Fe Fum-FA-DHA (PRENATAL 1 PO) Take by mouth.   12/05/2020   albuterol (VENTOLIN HFA) 108 (90 Base) MCG/ACT inhaler INHALE 2 PUFFS EVERY 4 HOURS AS NEEDED FOR WHEEZING. (Patient not taking: No sig reported) 18 g 0    Cetirizine HCl 10 MG CAPS Zyrtec prn (Patient not taking: No sig reported)      fluticasone (FLONASE) 50 MCG/ACT nasal spray Place 2 sprays into both nostrils daily. (Patient not taking: No sig reported) 16 g 0    hydrOXYzine (ATARAX/VISTARIL) 10 MG tablet TAKE ONE TABLET BY MOUTH EVERY 8 HOURS AS NEEDED FOR ANXIETY. (Patient not taking: Reported on 11/30/2020) 30 tablet 6    Misc. Devices (BREAST PUMP) MISC Double electric breast pump (Patient not taking: No sig reported) 1 each 0      Review of Systems   All systems reviewed and negative except as stated in HPI  Blood pressure (!) 142/79, pulse 95, last menstrual period 03/11/2020, SpO2 99 %. General appearance: alert, cooperative, and no distress Lungs: normal respiratory effort Heart: regular rate and rhythm Abdomen: soft, non-tender; gravid Pelvic: as noted below Extremities: Homans sign is negative, no sign of DVT Presentation: cephalic by MAU RN exam Fetal monitoringBaseline: 130 bpm, Variability: Good {> 6 bpm), Accelerations: Reactive, and Decelerations: Absent Uterine activityFrequency: Every 1-4 minutes Dilation: 5.5 Effacement (%): 70 Station: -2 Exam by:: Tomasa Hose, RN   Prenatal labs: ABO, Rh: B/Positive/-- (01/26 1651) Antibody: Negative (05/05 0826) Rubella: 1.34 (01/26 1651) RPR: Non Reactive (05/05 0826)  HBsAg: Negative (01/26 1651)  HIV: Non Reactive (05/05 0826)  GBS: Negative/-- (06/30 1635)  2 hr Glucola passed Genetic screening  nml Anatomy US nml  Prenatal Transfer Tool  Maternal Diabetes: No Genetic Screening: Normal Maternal Ultrasounds/Referrals: Normal Fetal Ultrasounds or other Referrals:  None Maternal Substance Abuse:  No   Significant Maternal Medications:  Meds include: Other: lexapro, wellbutrin Significant Maternal Lab Results: Group B Strep negative  No results found for this or any previous visit (from the past 24 hour(s)).  Patient Active Problem List   Diagnosis Date Noted   Encounter for supervision of normal pregnancy, antepartum 06/08/2020   H/O transient HTN 09/13/2019   Elevated BP without diagnosis of hypertension 07/23/2019   Migraine without aura and without status migrainosus, not intractable 11/02/2018   Depression with anxiety 11/13/2015   Gastroesophageal reflux disease with esophagitis 09/19/2014   IBS 10/30/2009   Asthma,  chronic 10/26/2009    Assessment/Plan:  Javaeh Muscatello is a 35 y.o. [id number] at 18w4dhere for SOL  #SOL: Continue expectant management. Discussed multiple elevated blood pressures since admission and that this may preclude patient from water birth. Given that she has not yet ruled in for gHTN, will continue to monitor BP and await preE labs before making final decision. Discussed with LLattie Haw CNM on for waterbirth at this time. #Pain: prn #FWB: Cat 1 #ID:  GBS negative #MOF: both #MOC: depo #Circ:  Yes@[facility]  #Elevated BP without diagnosis: history of elevated BP throughout pregnancy, has not met criteria. Asymptomatic. preE labs pending. Continue to monitor. #Anxiety/depression: wellbutrin and lexapro  AErskine Emery MD  12/06/2020, 2:50 AM

## 2020-12-07 ENCOUNTER — Encounter: Payer: No Typology Code available for payment source | Admitting: Obstetrics & Gynecology

## 2020-12-07 ENCOUNTER — Other Ambulatory Visit (HOSPITAL_COMMUNITY): Payer: Self-pay

## 2020-12-07 ENCOUNTER — Encounter (HOSPITAL_COMMUNITY): Payer: Self-pay | Admitting: Obstetrics and Gynecology

## 2020-12-07 ENCOUNTER — Other Ambulatory Visit: Payer: No Typology Code available for payment source

## 2020-12-07 LAB — SURGICAL PATHOLOGY

## 2020-12-07 MED ORDER — ACETAMINOPHEN 325 MG PO TABS
650.0000 mg | ORAL_TABLET | ORAL | 0 refills | Status: DC | PRN
  Filled 2020-12-07: qty 30, 3d supply, fill #0

## 2020-12-07 NOTE — Lactation Note (Signed)
This note was copied from a baby's chart. Lactation Consultation Note  Patient Name: Brianna Griffin ENIDP'O Date: 12/07/2020 Reason for consult: Follow-up assessment;Early term 37-38.6wks;Infant weight loss;Other (Comment) (5 % weight loss/ post ciirc) Age:35 hours Per mom starting to get sore nipples / LC offered to assess and no breakdown noted, LC reviewed hand expressing with several  drops of colostrum.  LC provided comfort gels for after feedings/alternating with shells. Some areola edema noted.  Baby post circ / sleepy. Per mom baby ate prior to circ. LC reviewed BF D/C teaching. - see below.  Mom has the [facility] resource pamphlet with resource numbers.   Maternal Data    Feeding Mother's Current Feeding Choice: Breast Milk  LATCH Score                    Lactation Tools Discussed/Used    Interventions Interventions: Breast feeding basics reviewed;Education;Comfort gels;Shells  Discharge Discharge Education: Engorgement and breast care;Warning signs for feeding baby Pump: Personal;DEBP WIC Program: No  Consult Status Consult Status: Complete Date: 12/07/20    Myer Haff 12/07/2020, 11:13 AM

## 2020-12-13 ENCOUNTER — Encounter: Payer: Self-pay | Admitting: *Deleted

## 2020-12-13 ENCOUNTER — Ambulatory Visit (INDEPENDENT_AMBULATORY_CARE_PROVIDER_SITE_OTHER): Payer: No Typology Code available for payment source | Admitting: *Deleted

## 2020-12-13 ENCOUNTER — Other Ambulatory Visit: Payer: Self-pay

## 2020-12-13 ENCOUNTER — Other Ambulatory Visit (HOSPITAL_COMMUNITY): Payer: Self-pay

## 2020-12-13 VITALS — BP 135/91 | HR 78

## 2020-12-13 DIAGNOSIS — R030 Elevated blood-pressure reading, without diagnosis of hypertension: Secondary | ICD-10-CM

## 2020-12-13 DIAGNOSIS — Z0130 Encounter for examination of blood pressure without abnormal findings: Secondary | ICD-10-CM

## 2020-12-13 MED ORDER — AMLODIPINE BESYLATE 5 MG PO TABS
5.0000 mg | ORAL_TABLET | Freq: Every day | ORAL | 3 refills | Status: DC
  Filled 2020-12-13: qty 30, 30d supply, fill #0

## 2020-12-13 NOTE — Progress Notes (Addendum)
   NURSE VISIT- BLOOD PRESSURE CHECK  SUBJECTIVE:  Brianna Griffin is a 35 y.o. [phone] female here for BP check. She is postpartum, delivery date 12/06/20     HYPERTENSION ROS:  Postpartum:  Severe headaches that don't go away with tylenol/other medicines: No  Visual changes (seeing spots/double/blurred vision) No  Severe pain under right breast breast or in center of upper chest No  Severe nausea/vomiting No  Taking medicines as instructed no  OBJECTIVE:  Breastfeeding Yes   Appearance alert, well appearing, and in no distress and oriented to person, place, and time.  ASSESSMENT: Postpartum  blood pressure check  PLAN: Discussed with Dr. Nelda Marseille   Recommendations: new prescription will be sent   Follow-up:  1 week virtual bp check    Alice Rieger  12/13/2020 10:31 AM  Norvasc '5mg'$  sent in.  F/U in 1wk Chart reviewed for nurse visit. Agree with plan of care.  Tan[place], [place], DO 12/13/2020 1:31 PM

## 2020-12-14 NOTE — Telephone Encounter (Signed)
Pt states she checked her BP today about 1:30pm & it was 115/73 without meds  Please advise

## 2020-12-16 ENCOUNTER — Inpatient Hospital Stay (HOSPITAL_COMMUNITY): Admit: 2020-12-16 | Payer: Self-pay

## 2020-12-16 ENCOUNTER — Telehealth (HOSPITAL_COMMUNITY): Payer: Self-pay

## 2020-12-16 NOTE — Telephone Encounter (Signed)
"  Doing ok, we are doing good. Everything is going fine." Patient has no questions or concerns about her healing.  "He's great. He is eating and gaining weight. He has actually gained past his birthweight. He sleeps in a pack n play." RN reviewed the ABC's of safe sleep with patient. Patient declines questions or concerns about baby.  EPDS score is 7.  Sharyn Lull [facility] 12/16/2020,1431

## 2020-12-20 ENCOUNTER — Telehealth (INDEPENDENT_AMBULATORY_CARE_PROVIDER_SITE_OTHER): Payer: No Typology Code available for payment source | Admitting: *Deleted

## 2020-12-20 VITALS — BP 114/73 | HR 87

## 2020-12-20 DIAGNOSIS — Z0130 Encounter for examination of blood pressure without abnormal findings: Secondary | ICD-10-CM

## 2020-12-20 NOTE — Progress Notes (Addendum)
   NURSE VISIT- BLOOD PRESSURE CHECK  I connected with Hartley Barefoot on 12/20/2020 by telephone  and verified that I am speaking with the correct person using two identifiers.   I discussed the limitations of evaluation and management by telemedicine. The patient expressed understanding and agreed to proceed.  Nurse is at the office, and patient is at home.  SUBJECTIVE:  Brianna Griffin is a 35 y.o. [phone] female here for BP check. She is postpartum, delivery date 12/06/20     HYPERTENSION ROS:  Pregnant/postpartum:  Severe headaches that don't go away with tylenol/other medicines: No  Visual changes (seeing spots/double/blurred vision) No  Severe pain under right breast breast or in center of upper chest No  Severe nausea/vomiting No  Taking medicines as instructed no    OBJECTIVE:  BP 114/73 (BP Location: Left Arm, Patient Position: Sitting, Cuff Size: Normal)   Pulse 87   Breastfeeding Yes   Appearance alert, well appearing, and in no distress.  ASSESSMENT: Postpartum  blood pressure check  PLAN: Discussed with Knute Neu, CNM, [facility]   Recommendations: no changes needed   Follow-up: as scheduled   [name]  12/20/2020 9:32 AM   Chart reviewed for nurse visit. Agree with plan of care.  [name], [place] 12/20/2020 2:33 PM

## 2020-12-22 ENCOUNTER — Telehealth: Payer: No Typology Code available for payment source | Admitting: Emergency Medicine

## 2020-12-22 DIAGNOSIS — T63451A Toxic effect of venom of hornets, accidental (unintentional), initial encounter: Secondary | ICD-10-CM | POA: Diagnosis not present

## 2020-12-22 DIAGNOSIS — T63441A Toxic effect of venom of bees, accidental (unintentional), initial encounter: Secondary | ICD-10-CM | POA: Diagnosis not present

## 2020-12-22 DIAGNOSIS — T63461A Toxic effect of venom of wasps, accidental (unintentional), initial encounter: Secondary | ICD-10-CM

## 2020-12-22 NOTE — Progress Notes (Signed)
E-Visit for Insect Sting  Thank you for describing the insect sting for [place].  Here is how we plan to help!  An uncomplicated insect sting that just occurred and can be closely followed using the instructions in your care plan.  I would recommend using Benadryl '25mg'$  twice a day for the next few days.  The 2 greatest risks from insect stings are allergic reaction, which can be fatal in some people and infection, which is more common and less serious.  Bees, wasps, yellow jackets, and hornets belong to a class of insects called Hymenoptera.  Most insect stings cause only minor discomfort.  Stings can happen anywhere on the body and can be painful.  Most stings are from honey bees or yellow jackets.  Fire ants can sting multiple times.  The sites of the stings are more likely to become infected.      What can be used to prevent Insect Stings?  Insect repellant with at least 20% DEET.  Wearing long pants and shirts with socks and shoes.  Wear dark or drab-colored clothes rather than bright colors.  Avoid using perfumes and hair sprays; these attract insects.  HOME CARE ADVICE:  1. Stinger removal: The stinger looks like a tiny black dot in the sting. Use a fingernail, credit card edge, or knife-edge to scrape it off.  Don't pull it out because it squeezes out more venom. If the stinger is below the skin surface, leave it alone.  It will be shed with normal skin healing. 2. Use cold compresses to the area of the sting for 10-20 minutes.  You may repeat this as needed to relieve symptoms of pain and swelling. 3.  For pain relief, take acetominophen 650 mg 4-6 hours as needed or ibuprofen 400 mg every 6-8 hours as needed or naproxen 250-500 mg every 12 hours as needed. 4.  You can also use hydrocortisone cream 0.5% or 1% up to 4 times daily as needed for itching. 5.  If the sting becomes very itchy, take Benadryl 25-50 mg, follow directions on box. 6.  Wash the area 2-3 times daily with  antibacterial soap and warm water. 7. Call your Doctor if: Fever, a severe headache, or rash occur in the next 2 weeks. Sting area begins to look infected. Redness and swelling worsens after home treatment. Your current symptoms become worse.    MAKE SURE YOU:  Understand these instructions. Will watch your condition. Will get help right away if you are not doing well or get worse.  Thank you for choosing an e-visit.  Your e-visit answers were reviewed by a board certified advanced clinical practitioner to complete your personal care plan. Depending upon the condition, your plan could have included both over the counter or prescription medications.  Please review your pharmacy choice. Make sure the pharmacy is open so you can pick up prescription now. If there is a problem, you may contact your provider through [employer] and have the prescription routed to another pharmacy.  Your safety is important to [place]. If you have drug allergies check your prescription carefully.   For the next 24 hours you can use MyChart to ask questions about today's visit, request a non-urgent call back, or ask for a work or school excuse. You will get an email in the next two days asking about your experience. I hope that your e-visit has been valuable and will speed your recovery.  Approximately 5 minutes was used in reviewing the patient's chart, questionnaire,  prescribing medications, and documentation.

## 2021-01-10 ENCOUNTER — Other Ambulatory Visit (HOSPITAL_COMMUNITY): Payer: Self-pay

## 2021-01-10 ENCOUNTER — Encounter: Payer: Self-pay | Admitting: Advanced Practice Midwife

## 2021-01-10 ENCOUNTER — Other Ambulatory Visit: Payer: Self-pay

## 2021-01-10 ENCOUNTER — Ambulatory Visit (INDEPENDENT_AMBULATORY_CARE_PROVIDER_SITE_OTHER): Payer: No Typology Code available for payment source | Admitting: Advanced Practice Midwife

## 2021-01-10 VITALS — BP 125/78 | HR 78 | Wt 154.0 lb

## 2021-01-10 DIAGNOSIS — Z30013 Encounter for initial prescription of injectable contraceptive: Secondary | ICD-10-CM | POA: Diagnosis not present

## 2021-01-10 MED ORDER — MEDROXYPROGESTERONE ACETATE 150 MG/ML IM SUSY
150.0000 mg | PREFILLED_SYRINGE | INTRAMUSCULAR | 3 refills | Status: DC
  Filled 2021-01-10: qty 1, 90d supply, fill #0
  Filled 2021-06-21: qty 1, 90d supply, fill #1
  Filled 2021-09-18: qty 1, 90d supply, fill #2
  Filled 2021-12-04: qty 1, 90d supply, fill #3

## 2021-01-10 MED ORDER — MEDROXYPROGESTERONE ACETATE 150 MG/ML IM SUSP
150.0000 mg | Freq: Once | INTRAMUSCULAR | Status: AC
  Administered 2021-01-10: 150 mg via INTRAMUSCULAR

## 2021-01-10 NOTE — Progress Notes (Signed)
Post Partum Visit Note   Chief Complaint:   Postpartum Care  History of Present Illness:   Brianna Griffin is a 35 y.o. [phone] Caucasian female being seen today for a postpartum visit. She is 5 weeks postpartum following a spontaneous vaginal delivery at 38.4 gestational weeks. IOL: No, for . Anesthesia: none.  Laceration: none.  Complications:  Borderline HTN.  Given rx for norvasc 1 week pp, never took because all home BPs were normal. Inpatient contraception: no.   Pregnancy uncomplicated. Tobacco use: no. Substance use disorder: no. Last pap smear: 06/24/2018 and results were NILM w/ HRHPV negative. Next pap smear due: 2023 No LMP recorded.  Postpartum course has been uncomplicated. Bleeding no bleeding. Bowel function is normal. Bladder function is normal. Urinary incontinence? No, fecal incontinence? No Patient is not sexually active. Last sexual activity: prior to birth.  Desired contraception: Depo. Patient does not want a pregnancy in the future.  Desired family size is 3 children.   Upstream - 01/10/21 1209       Pregnancy Intention Screening   Does the patient want to become pregnant in the next year? No    Does the patient's partner want to become pregnant in the next year? No    Would the patient like to discuss contraceptive options today? No      Contraception Wrap Up   Current Method Abstinence    End Method Hormonal Injection    Contraception Counseling Provided Yes            The pregnancy intention screening data noted above was reviewed. Potential methods of contraception were discussed. The patient elected to proceed with Hormonal Injection.  Edinburgh Postpartum Depression Screening:   Feels like mood has improved over the last week.  On wellbutrin 300 and lexapro 49m.     Edinburgh Postnatal Depression Scale - 01/10/21 1210       Edinburgh Postnatal Depression Scale:  In the Past 7 Days   I have been able to laugh and see the funny side of things. 0     I have looked forward with enjoyment to things. 0    I have blamed myself unnecessarily when things went wrong. 3    I have been anxious or worried for no good reason. 1    I have felt scared or panicky for no good reason. 2    Things have been getting on top of me. 2    I have been so unhappy that I have had difficulty sleeping. 0    I have felt sad or miserable. 2    I have been so unhappy that I have been crying. 2    The thought of harming myself has occurred to me. 0    Edinburgh Postnatal Depression Scale Total 12            Baby's course has been uncomplicated. Baby is feeding by breast: milk supply adequate. Infant has a pediatrician/family doctor? Yes.  Childcare strategy if returning to work/school: .  Pt has material needs met for her and baby: Yes.   Review of Systems:   Pertinent items are noted in HPI Denies Abnormal vaginal discharge w/ itching/odor/irritation, headaches, visual changes, shortness of breath, chest pain, abdominal pain, severe nausea/vomiting, or problems with urination or bowel movements. Pertinent History Reviewed:  Reviewed past medical,surgical, obstetrical and family history.  Reviewed problem list, medications and allergies. OB History  Gravida Para Term Preterm AB Living  _0 2  SAB IAB Ectopic Multiple Live Births  3     0 2    # Outcome Date GA Lbr Len/2nd Weight Sex Delivery Anes PTL Lv  6 Gravida           5 SAB 02/2020          4 Term 09/01/15 68w1d13:50 / 00:08 7 lb 7.4 oz (3.385 kg) M Vag-Spont EPI N LIV  3 SAB 02/2014          2 SAB 08/2013 650w0d       1 Term 04/12/09 4044w1d lb 15 oz (2.693 kg) F Vag-Spont None N LIV   Physical Assessment:   Vitals:   01/10/21 1214  BP: 125/78  Pulse: 78  Weight: 154 lb (69.9 kg)  Body mass index is 26.43 kg/m.  Objective:  Blood pressure 125/78, pulse 78, weight 154 lb (69.9 kg), currently breastfeeding.  General:  alert, cooperative, and no distress   Breasts:  negative   Lungs: Normal respiratory effort  Heart:  regular rate and rhythm  Abdomen: soft, non-tender,    Vulva:  normal  Vagina: normal vagina  Cervix:  normal  Corpus: Well involuted  Adnexa:  not evaluated  Rectal Exam: no hemorrhoids          No results found for this or any previous visit (from the past 24 hour(s)).  Assessment & Plan:  1) Postpartum exam 2) 5 wks s/p spontaneous vaginal delivery 3) breast feeding 4) Depression screening 5) Contraception depo today, rx sent in  Essential components of care per ACOG recommendations:  1.  Mood and well being:  If positive depression screen, discussed and plan developed. Will refer to therapist if mood does not continue to improve. If using tobacco we discussed reduction/cessation and risk of relapse If current substance abuse, we discussed and referral to local resources was offered.   2. Infant care and feeding:  If breastfeeding, discussed returning to work, pumping, breastfeeding-associated pain, guidance regarding return to fertility while lactating if not using another method. If needed, patient was provided with a letter to be allowed to pump q 2-3hrs to support lactation in a private location with access to a refrigerator to store breastmilk.   Recommended that all caregivers be immunized for flu, pertussis and other preventable communicable diseases If pt does not have material needs met for her/baby, referred to local resources for help obtaining these.  3. Sexuality, contraception and birth spacing Provided guidance regarding sexuality, management of dyspareunia, and resumption of intercourse Discussed avoiding interpregnancy interval <6mt40mand recommended birth spacing of 18 months  4. Sleep and fatigue Discussed coping options for fatigue and sleep disruption Encouraged family/partner/community support of 4 hrs of uninterrupted sleep to help with mood and fatigue  5. Physical recovery  If pt had a C/S, assessed  incisional pain and providing guidance on normal vs prolonged recovery If pt had a laceration, perineal healing and pain reviewed.  If urinary or fecal incontinence, discussed management and referred to PT or uro/gyn if indicated  Patient is safe to resume physical activity. Discussed attainment of healthy weight.  6.  Chronic disease management Discussed pregnancy complications if any, and their implications for future childbearing and long-term maternal health. Review recommendations for prevention of recurrent pregnancy complications, such as 17 hydroxyprogesterone caproate to reduce risk for recurrent PTB not applicable, or aspirin to reduce risk of preeclampsia yes. Pt had GDM: No. If yes, 2hr GTT scheduled: not applicable. Reviewed medications  and non-pregnant dosing including consideration of whether pt is breastfeeding using a reliable resource such as LactMed: yes Referred for f/u w/ PCP or subspecialist providers as indicated: not applicable  7. Health maintenance Mammogram at 35yo or earlier if indicated Pap smears as indicated  Meds:  Meds ordered this encounter  Medications   medroxyPROGESTERone Acetate 150 MG/ML SUSY    Sig: Inject 1 mL (150 mg total) into the muscle every 3 (three) months.    Dispense:  1 mL    Refill:  3    Order Specific Question:   Supervising Provider    Answer:   Elonda Husky, LUTHER H [[id number]]   medroxyPROGESTERone (DEPO-PROVERA) injection 150 mg    Follow-up: No follow-ups on file.   No orders of the defined types were placed in this encounter.      [place] for [employer], [place] [facility] 01/10/2021 1:41 PM

## 2021-01-18 ENCOUNTER — Telehealth: Payer: No Typology Code available for payment source | Admitting: Physician Assistant

## 2021-01-18 DIAGNOSIS — J4521 Mild intermittent asthma with (acute) exacerbation: Secondary | ICD-10-CM | POA: Diagnosis not present

## 2021-01-18 DIAGNOSIS — R058 Other specified cough: Secondary | ICD-10-CM

## 2021-01-18 MED ORDER — ALBUTEROL SULFATE HFA 108 (90 BASE) MCG/ACT IN AERS: 2.0000 | INHALATION_SPRAY | Freq: Four times a day (QID) | RESPIRATORY_TRACT | 0 refills | Status: DC | PRN

## 2021-01-18 MED ORDER — BENZONATATE 100 MG PO CAPS: 100.0000 mg | ORAL_CAPSULE | Freq: Three times a day (TID) | ORAL | 0 refills | Status: DC | PRN

## 2021-01-18 NOTE — Progress Notes (Signed)
Visit for Asthma  Based on what you have shared with me, it looks like you may have a flare up of your asthma.  Asthma is a chronic (ongoing) lung disease which results in airway obstruction, inflammation and hyper-responsiveness.   Asthma symptoms vary from person to person, with common symptoms including nighttime awakening and decreased ability to participate in normal activities as a result of shortness of breath. It is often triggered by changes in weather, changes in the season, changes in air temperature, or inside (home, school, daycare or work) allergens such as animal dander, mold, mildew, woodstoves or cockroaches.   It can also be triggered by hormonal changes, extreme emotion, physical exertion or an upper respiratory tract illness.     It is important to identify the trigger, and then eliminate or avoid the trigger if possible.   If you have been prescribed medications to be taken on a regular basis, it is important to follow the asthma action plan and to follow guidelines to adjust medication in response to increasing symptoms of decreased peak expiratory flow rate  You may also have post-viral cough syndrome. This is a chronic cough after a moderate upper respiratory infection that is caused from residual inflammation in the airway from the previous infection.   Treatment: I have prescribed: Albuterol (Proventil HFA; Ventolin HFA) 108 (90 Base) MCG/ACT Inhaler 2 puffs into the lungs every six hours as needed for wheezing or shortness of breath; Tessalon perles '100mg'$  Take 1-2 capsules three times daily as needed for cough  HOME CARE Only take medications as instructed by your medical team. Consider wearing a mask or scarf to improve breathing air temperature have been shown to decrease irritation and decrease exacerbations Get rest. Taking a steamy shower or using a  humidifier may help nasal congestion sand ease sore throat pain. You can place a towel over your head and breathe in the steam from hot water coming from a faucet. Using a saline nasal spray works much the same way.  Cough drops, hare candies and sore throat lozenges may ease your cough.  Avoid close contacts especially the very you and the elderly Cover your mouth if you cough or sneeze Always remember to wash your hands.    GET HELP RIGHT AWAY IF: You develop worsening symptoms; breathlessness at rest, drowsy, confused or agitated, unable to speak in full sentences You have coughing fits You develop a severe headache or visual changes You develop shortness of breath, difficulty breathing or start having chest pain Your symptoms persist after you have completed your treatment plan If your symptoms do not improve within 10 days  MAKE SURE YOU Understand these instructions. Will watch your condition. Will get help right away if you are not doing well or get worse.   Your e-visit answers were reviewed by a board certified advanced clinical practitioner to complete your personal care plan, Depending upon the condition, your plan could have included both over the counter or prescription medications.   Please review your pharmacy choice. Your safety is important to [place]. If you have drug allergies check your prescription carefully.  You can use MyChart to ask questions about today's visit, request a non-urgent  call back, or ask for a work or school excuse for 24 hours related to this e-Visit. If it has been greater than 24 hours you will need to follow up with your provider, or enter a new e-Visit to address those concerns.   You will get an e-mail in the  next two days asking about your experience. I hope that your e-visit has been valuable and will speed your recovery. Thank you for using e-visits.  I provided 5 minutes of non face-to-face time during this encounter for chart review and  documentation.

## 2021-01-23 ENCOUNTER — Other Ambulatory Visit (HOSPITAL_COMMUNITY): Payer: Self-pay

## 2021-01-23 MED FILL — Bupropion HCl Tab ER 24HR 300 MG: ORAL | 90 days supply | Qty: 90 | Fill #1 | Status: AC

## 2021-01-23 MED FILL — Escitalopram Oxalate Tab 20 MG (Base Equiv): ORAL | 90 days supply | Qty: 90 | Fill #1 | Status: AC

## 2021-04-02 ENCOUNTER — Ambulatory Visit: Payer: No Typology Code available for payment source

## 2021-04-02 ENCOUNTER — Ambulatory Visit (INDEPENDENT_AMBULATORY_CARE_PROVIDER_SITE_OTHER): Payer: No Typology Code available for payment source | Admitting: *Deleted

## 2021-04-02 ENCOUNTER — Other Ambulatory Visit: Payer: Self-pay

## 2021-04-02 DIAGNOSIS — Z3042 Encounter for surveillance of injectable contraceptive: Secondary | ICD-10-CM | POA: Diagnosis not present

## 2021-04-02 MED ORDER — MEDROXYPROGESTERONE ACETATE 150 MG/ML IM SUSP
150.0000 mg | Freq: Once | INTRAMUSCULAR | Status: AC
  Administered 2021-04-02: 150 mg via INTRAMUSCULAR

## 2021-04-02 NOTE — Progress Notes (Signed)
   NURSE VISIT- INJECTION  SUBJECTIVE:  Brianna Griffin is a 35 y.o. [phone] female here for a Depo Provera for contraception/period management. She is a GYN patient.   OBJECTIVE:  There were no vitals taken for this visit.  Appears well, in no apparent distress  Injection administered in: Right deltoid  Meds ordered this encounter  Medications   medroxyPROGESTERone (DEPO-PROVERA) injection 150 mg    ASSESSMENT: GYN patient Depo Provera for contraception/period management PLAN: Follow-up: in 11-13 weeks for next Depo   Brianna Griffin  04/02/2021 4:19 PM

## 2021-04-04 ENCOUNTER — Telehealth: Payer: No Typology Code available for payment source | Admitting: Physician Assistant

## 2021-04-04 DIAGNOSIS — R0781 Pleurodynia: Secondary | ICD-10-CM

## 2021-04-04 NOTE — Progress Notes (Signed)
Based on what you shared with me, I feel your condition warrants further evaluation and I recommend that you be seen in a face to face visit.  Giving pain is mainly with breathing, this needs a further evaluation in person so you can get a detailed examination of the lungs, heart and chest wall to rule out any more serious causes of this discomfort and to make sure you get proper treatment.    NOTE: There will be NO CHARGE for this eVisit   If you are having a true medical emergency please call 911.      For an urgent face to face visit, [place] has six urgent care centers for your convenience:     [facility] [place] [facility] [place] Get Driving Directions [phone] [address] [place], [place] [address]    [facility]) Get Driving Directions [phone] [place], [place] [address]  [facility] [place] [facility]) Get Driving Directions [phone] [address] [place],  [place]  [address]  [facility] Get Driving Directions [phone] [address] [place], [place], [place] [address]   [facility] Get Driving Directions  [phone] [address] [place],  [address]   [facility] [place] Get Driving Directions [phone] [address] [place],  [address]  Your MyChart E-visit questionnaire answers were reviewed by a board certified advanced clinical practitioner to complete your personal care plan based on your specific symptoms.  Thank you for using e-Visits.

## 2021-04-24 ENCOUNTER — Other Ambulatory Visit (HOSPITAL_COMMUNITY): Payer: Self-pay

## 2021-04-24 ENCOUNTER — Ambulatory Visit (INDEPENDENT_AMBULATORY_CARE_PROVIDER_SITE_OTHER): Payer: No Typology Code available for payment source | Admitting: Family Medicine

## 2021-04-24 ENCOUNTER — Other Ambulatory Visit: Payer: Self-pay

## 2021-04-24 DIAGNOSIS — M898X1 Other specified disorders of bone, shoulder: Secondary | ICD-10-CM | POA: Diagnosis not present

## 2021-04-24 DIAGNOSIS — F418 Other specified anxiety disorders: Secondary | ICD-10-CM | POA: Diagnosis not present

## 2021-04-24 MED ORDER — BUPROPION HCL ER (XL) 300 MG PO TB24
ORAL_TABLET | Freq: Every day | ORAL | 3 refills | Status: DC
  Filled 2021-04-24: qty 90, 90d supply, fill #0
  Filled 2021-10-01: qty 90, 90d supply, fill #1
  Filled 2022-01-15: qty 90, 90d supply, fill #2

## 2021-04-24 MED ORDER — ESCITALOPRAM OXALATE 20 MG PO TABS
ORAL_TABLET | Freq: Every day | ORAL | 3 refills | Status: DC
  Filled 2021-04-24: qty 90, 90d supply, fill #0

## 2021-04-24 NOTE — Progress Notes (Signed)
Subjective:  Patient ID: Brianna Griffin, female    DOB: 1986/02/24  Age: 35 y.o. MRN: [id number]  CC: Chief Complaint  Patient presents with   Follow-up    Follow up on medications Pain in right shoulder that goes around to right breast    HPI:  35 year old female with depression and anxiety, migraine, and history of hypertension presents for follow-up.  Patient states that she is doing fairly well.  She recently had a baby.  She states that she is seeing a Social worker.  She is currently on Wellbutrin and Lexapro.  Needs refills.  PHQ-9 - 3 today.  Patient also reports that she has recently developed pain around her right shoulder blade which extends around to her right breast.  Its somewhat troublesome occurs mostly when she takes a deep breath.  She is currently breast-feeding.  No chest pain or shortness of breath.  Patient Active Problem List   Diagnosis Date Noted   Periscapular pain 04/24/2021   H/O transient HTN 09/13/2019   Migraine without aura and without status migrainosus, not intractable 11/02/2018   Depression with anxiety 11/13/2015   Gastroesophageal reflux disease with esophagitis 09/19/2014   IBS 10/30/2009   Asthma, chronic 10/26/2009    Social Hx   Social History   Socioeconomic History   Marital status: Married    Spouse name: Bitania Shankland   Number of children: Not on file   Years of education: Not on file   Highest education level: Not on file  Occupational History   Not on file  Tobacco Use   Smoking status: Never   Smokeless tobacco: Never  Vaping Use   Vaping Use: Never used  Substance and Sexual Activity   Alcohol use: No    Alcohol/week: 0.0 standard drinks   Drug use: No   Sexual activity: Yes    Birth control/protection: None  Other Topics Concern   Not on file  Social History Narrative   Not on file   Social Determinants of Health   Financial Resource Strain: Low Risk    Difficulty of Paying Living Expenses: Not hard at all   Food Insecurity: No Food Insecurity   Worried About Charity fundraiser in the Last Year: Never true   Ran Out of Food in the Last Year: Never true  Transportation Needs: No Transportation Needs   Lack of Transportation (Medical): No   Lack of Transportation (Non-Medical): No  Physical Activity: Inactive   Days of Exercise per Week: 0 days   Minutes of Exercise per Session: 0 min  Stress: Stress Concern Present   Feeling of Stress : Very much  Social Connections: Moderately Integrated   Frequency of Communication with Friends and Family: More than three times a week   Frequency of Social Gatherings with Friends and Family: Once a week   Attends Religious Services: More than 4 times per year   Active Member of [employer] or Organizations: No   Attends Music therapist: Never   Marital Status: Married    Review of Systems Per HPI  Objective:  BP 138/89   Pulse 93   Ht 5\' 4"  (1.626 m)   Wt 147 lb 9.6 oz (67 kg)   SpO2 98%   BMI 25.34 kg/m   BP/Weight 04/24/2021 11/13/348 0/01/3817  Systolic BP 299 371 696  Diastolic BP 89 78 73  Wt. (Lbs) 147.6 154 -  BMI 25.34 26.43 -    Physical Exam Constitutional:  General: She is not in acute distress.    Appearance: Normal appearance. She is not ill-appearing.  HENT:     Head: Normocephalic and atraumatic.  Eyes:     General:        Right eye: No discharge.        Left eye: No discharge.     Conjunctiva/sclera: Conjunctivae normal.  Cardiovascular:     Rate and Rhythm: Normal rate and regular rhythm.     Heart sounds: No murmur heard. Pulmonary:     Effort: Pulmonary effort is normal.     Breath sounds: Normal breath sounds. No wheezing, rhonchi or rales.  Musculoskeletal:     Comments: No tenderness around the right scapula.  Neurological:     Mental Status: She is alert.  Psychiatric:        Mood and Affect: Mood normal.        Behavior: Behavior normal.    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  Refilled today.      Relevant Medications   buPROPion (WELLBUTRIN XL) 300 MG 24 hr tablet   escitalopram (LEXAPRO) 20 MG tablet   Periscapular pain    Suspect that this is musculoskeletal in origin.  Probably in relation to holding her baby and breast-feeding frequently.  Advised Tylenol and ibuprofen.  Heat.  Supportive care.       Meds ordered this encounter  Medications   buPROPion (WELLBUTRIN XL) 300 MG 24 hr tablet    Sig: TAKE 1 TABLET BY MOUTH AT BEDTIME    Dispense:  90 tablet    Refill:  3   escitalopram (LEXAPRO) 20 MG tablet    Sig: TAKE 1 TABLET BY MOUTH ONCE A DAY    Dispense:  90 tablet    Refill:  3    Follow-up:  Return for 6 months to 1 year.  [place]

## 2021-04-24 NOTE — Patient Instructions (Signed)
Meds refilled.   Heat, tylenol, and ibuprofen.   Call with concerns.  Follow up in 6 months - 1 year.   Take care  Dr. Lacinda Axon

## 2021-04-24 NOTE — Assessment & Plan Note (Signed)
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  Refilled today.

## 2021-04-24 NOTE — Assessment & Plan Note (Signed)
Suspect that this is musculoskeletal in origin.  Probably in relation to holding her baby and breast-feeding frequently.  Advised Tylenol and ibuprofen.  Heat.  Supportive care.

## 2021-04-30 ENCOUNTER — Ambulatory Visit: Payer: No Typology Code available for payment source | Admitting: Family Medicine

## 2021-05-04 ENCOUNTER — Encounter: Payer: Self-pay | Admitting: Women's Health

## 2021-05-10 ENCOUNTER — Other Ambulatory Visit: Payer: Self-pay | Admitting: Women's Health

## 2021-05-10 DIAGNOSIS — N941 Unspecified dyspareunia: Secondary | ICD-10-CM

## 2021-06-15 ENCOUNTER — Ambulatory Visit (INDEPENDENT_AMBULATORY_CARE_PROVIDER_SITE_OTHER): Payer: No Typology Code available for payment source | Admitting: Nurse Practitioner

## 2021-06-15 ENCOUNTER — Encounter: Payer: Self-pay | Admitting: Nurse Practitioner

## 2021-06-15 ENCOUNTER — Other Ambulatory Visit: Payer: Self-pay

## 2021-06-15 VITALS — BP 138/83 | HR 87 | Ht 64.0 in | Wt 147.0 lb

## 2021-06-15 DIAGNOSIS — M7711 Lateral epicondylitis, right elbow: Secondary | ICD-10-CM

## 2021-06-15 NOTE — Patient Instructions (Signed)
Tennis Elbow Tennis elbow (lateral epicondylitis) is inflammation of tendons in your outer forearm, near your elbow. Tendons are tissues that connect muscle to bone. When you have tennis elbow, inflammation affects the tendons that you use to bend your wrist and move your hand up. Inflammation occurs in the lower part of the upper arm bone (humerus), where the tendons connect to the bone (lateral epicondyle). Tennis elbow often affects people who play tennis, but anyone may get the condition from repeatedly extending the wrist or turning the forearm. What are the causes? This condition is usually caused by repeatedly extending the wrist, turning the forearm, and using the hands. It can result from sports or work that requires repetitive forearm movements. In some cases, it may be caused by a sudden injury. What increases the risk? You are more likely to develop tennis elbow if you play tennis or another racket sport. You also have a higher risk if you frequently use your hands for work. Besides people who play tennis, others at greater risk include: People who use computers. Architect workers. People who work in [employer]. Musicians. Cooks. Cashiers. What are the signs or symptoms? Symptoms of this condition include: Pain and tenderness in the forearm and the outer part of the elbow. Pain may be felt only when using the arm, or it may be there all the time. A burning feeling that starts in the elbow and spreads down the forearm. A weak grip in the hand. How is this diagnosed? This condition is diagnosed based on your symptoms, your medical history, and a physical exam. You may also have X-rays or an MRI to: Confirm the diagnosis. Look for other issues. Check for tears in the ligaments, muscles, or tendons. How is this treated? Resting and icing your arm is often the first treatment. Your health care provider may also recommend: Medicines to reduce pain and inflammation. These may be in  the form of a pill, topical gels, or shots of a steroid medicine (cortisone). An elbow strap to reduce stress on the area. Physical therapy. This may include massage or exercises or both. An elbow brace to restrict the movements that cause symptoms. If these treatments do not help relieve your symptoms, your health care provider may recommend surgery to remove damaged muscle and reattach healthy muscle to bone. Follow these instructions at home: If you have a brace or strap: Wear the brace or strap as told by your health care provider. Remove it only as told by your health care provider. Check the skin around the brace or strap every day. Tell your health care provider about any concerns. Loosen the brace if your fingers tingle, become numb, or turn cold and blue. Keep the brace clean. If the brace or strap is not waterproof: Do not let it get wet. Cover it with a watertight covering when you take a bath or a shower. Managing pain, stiffness, and swelling  If directed, put ice on the injured area. To do this: If you have a removable brace or strap, remove it as told by your health care provider. Put ice in a plastic bag. Place a towel between your skin and the bag. Leave the ice on for 20 minutes, 2-3 times a day. Remove the ice if your skin turns bright red. This is very important. If you cannot feel pain, heat, or cold, you have a greater risk of damage to the area. Move your fingers often to reduce stiffness and swelling. Activity Rest your elbow and  wrist and avoid activities that cause symptoms as told by your health care provider. Do physical therapy exercises as told by your health care provider. If you lift an object, lift it with your palm facing up. This reduces stress on your elbow. Lifestyle If your tennis elbow is caused by sports, check your equipment and make sure that: You use it correctly. It is good match for you. If your tennis elbow is caused by work or computer  use, take frequent breaks to stretch your arm. Talk with your employer about ways to manage your condition at work. General instructions Take over-the-counter and prescription medicines only as told by your health care provider. Do not use any products that contain nicotine or tobacco. These products include cigarettes, chewing tobacco, and vaping devices, such as e-cigarettes. If you need help quitting, ask your health care provider. Keep all follow-up visits. This is important. How is this prevented? Before and after activity: Warm up and stretch before being active. Cool down and stretch after being active. Give your body time to rest between periods of activity. During activity: Make sure to use equipment that fits you. If you play tennis, put power in your stroke with your lower body. Avoid using your arm only. Maintain physical fitness, including: Strength. Flexibility. Endurance. Do exercises to strengthen the forearm muscles. Contact a health care provider if: You have pain that gets worse or does not get better with treatment. You have numbness or weakness in your forearm, hand, or fingers. Get help right away if: Your pain is severe. You cannot move your wrist. Summary Tennis elbow (lateral epicondylitis) is inflammation of tendons in your outer forearm, near your elbow. Common symptoms include pain and tenderness in your forearm and the outer part of your elbow. This condition is usually caused by repeatedly extending your wrist, turning your forearm, and using your hands. The first treatment is often resting and icing your arm to relieve symptoms. Further treatment may include taking medicine, getting physical therapy, wearing a brace or strap, or having surgery. This information is not intended to replace advice given to you by your health care provider. Make sure you discuss any questions you have with your health care provider. Document Revised: 11/16/2019 Document  Reviewed: 11/16/2019 Elsevier Patient Education  [place].

## 2021-06-15 NOTE — Progress Notes (Signed)
° °  Subjective:    Patient ID: Brianna Griffin, female    DOB: 1985/07/30, 36 y.o.   MRN: [id number]  HPI Presents for c/o right elbow pain for about 2 months. No history of injury or repetitive motion. Pain localized to the right epicondyle area. Worse with certain movements.  No weakness of the right arm.  No neck or right shoulder pain.        Objective:   Physical Exam NAD. Alert, oriented. Normal ROM of the neck and right shoulder without tenderness. Distinct localized tenderness noted with palpation near the right lateral epicondyle. Mild tenderness with ROM and rotation of the right lower arm.  Hand strength 5+ bilaterally.  Today's Vitals   06/15/21 1611  BP: 138/83  Pulse: 87  Weight: 147 lb (66.7 kg)  Height: 5\' 4"  (1.626 m)   Body mass index is 25.23 kg/m.        Assessment & Plan:  Lateral epicondylitis of right elbow Patient is allergic to ibuprofen.  Trial of topical Voltaren as directed, given samples.  Given written and verbal information on nonpharmacologic measures.  Encourage patient to wear an elbow strap as directed. Call back in 2 to 3 weeks if no improvement, consider elbow injection at that time.

## 2021-06-21 ENCOUNTER — Other Ambulatory Visit (HOSPITAL_COMMUNITY): Payer: Self-pay

## 2021-06-25 ENCOUNTER — Ambulatory Visit (INDEPENDENT_AMBULATORY_CARE_PROVIDER_SITE_OTHER): Payer: No Typology Code available for payment source

## 2021-06-25 ENCOUNTER — Other Ambulatory Visit: Payer: Self-pay

## 2021-06-25 DIAGNOSIS — Z3042 Encounter for surveillance of injectable contraceptive: Secondary | ICD-10-CM | POA: Diagnosis not present

## 2021-06-25 MED ORDER — MEDROXYPROGESTERONE ACETATE 150 MG/ML IM SUSY: PREFILLED_SYRINGE | Freq: Once | INTRAMUSCULAR | Status: AC

## 2021-06-25 NOTE — Progress Notes (Signed)
° °  NURSE VISIT- INJECTION  SUBJECTIVE:  Brianna Griffin is a 36 y.o. [phone] female here for a Depo Provera for contraception/period management. She is a GYN patient.   OBJECTIVE:  There were no vitals taken for this visit.  Appears well, in no apparent distress  Injection administered in: Left deltoid  Meds ordered this encounter  Medications   medroxyPROGESTERone Acetate SUSY    ASSESSMENT: GYN patient Depo Provera for contraception/period management PLAN: Follow-up: in 11-13 weeks for next Depo   Mitsuye Schrodt A Yarelie Hams  06/25/2021 4:18 PM

## 2021-06-27 ENCOUNTER — Telehealth: Payer: No Typology Code available for payment source | Admitting: Nurse Practitioner

## 2021-06-27 DIAGNOSIS — H5789 Other specified disorders of eye and adnexa: Secondary | ICD-10-CM | POA: Diagnosis not present

## 2021-06-27 MED ORDER — POLYMYXIN B-TRIMETHOPRIM 10000-0.1 UNIT/ML-% OP SOLN: 1.0000 [drp] | OPHTHALMIC | 0 refills | Status: DC

## 2021-06-27 NOTE — Progress Notes (Signed)

## 2021-07-23 ENCOUNTER — Other Ambulatory Visit (HOSPITAL_COMMUNITY): Payer: Self-pay

## 2021-08-15 ENCOUNTER — Ambulatory Visit: Payer: No Typology Code available for payment source | Admitting: Adult Health

## 2021-08-22 ENCOUNTER — Other Ambulatory Visit (HOSPITAL_COMMUNITY): Payer: Self-pay

## 2021-08-22 ENCOUNTER — Ambulatory Visit (INDEPENDENT_AMBULATORY_CARE_PROVIDER_SITE_OTHER): Payer: No Typology Code available for payment source | Admitting: Adult Health

## 2021-08-22 ENCOUNTER — Encounter: Payer: Self-pay | Admitting: Adult Health

## 2021-08-22 VITALS — BP 135/88 | HR 80 | Ht 64.0 in | Wt 150.0 lb

## 2021-08-22 DIAGNOSIS — F411 Generalized anxiety disorder: Secondary | ICD-10-CM | POA: Diagnosis not present

## 2021-08-22 DIAGNOSIS — F331 Major depressive disorder, recurrent, moderate: Secondary | ICD-10-CM

## 2021-08-22 MED ORDER — SERTRALINE HCL 50 MG PO TABS
50.0000 mg | ORAL_TABLET | Freq: Every day | ORAL | 2 refills | Status: DC
  Filled 2021-08-22: qty 30, 30d supply, fill #0
  Filled 2021-09-18: qty 30, 30d supply, fill #1
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
## 2021-08-30 ENCOUNTER — Other Ambulatory Visit: Payer: No Typology Code available for payment source | Admitting: Obstetrics & Gynecology

## 2021-09-04 ENCOUNTER — Encounter: Payer: Self-pay | Admitting: Obstetrics & Gynecology

## 2021-09-04 ENCOUNTER — Other Ambulatory Visit (HOSPITAL_COMMUNITY)
Admission: RE | Admit: 2021-09-04 | Discharge: 2021-09-04 | Disposition: A | Discharge status: Home / Self Care | Payer: No Typology Code available for payment source | Source: Ambulatory Visit | Attending: Obstetrics & Gynecology | Admitting: Obstetrics & Gynecology

## 2021-09-04 ENCOUNTER — Ambulatory Visit (INDEPENDENT_AMBULATORY_CARE_PROVIDER_SITE_OTHER): Payer: No Typology Code available for payment source | Admitting: Obstetrics & Gynecology

## 2021-09-04 VITALS — BP 137/90 | HR 96 | Ht 64.0 in | Wt 151.0 lb

## 2021-09-04 DIAGNOSIS — Z1329 Encounter for screening for other suspected endocrine disorder: Secondary | ICD-10-CM | POA: Diagnosis not present

## 2021-09-04 DIAGNOSIS — Z01419 Encounter for gynecological examination (general) (routine) without abnormal findings: Secondary | ICD-10-CM

## 2021-09-04 DIAGNOSIS — Z131 Encounter for screening for diabetes mellitus: Secondary | ICD-10-CM

## 2021-09-04 DIAGNOSIS — Z13220 Encounter for screening for lipoid disorders: Secondary | ICD-10-CM | POA: Diagnosis not present

## 2021-09-04 NOTE — Progress Notes (Signed)
? ?WELL-WOMAN EXAMINATION ?Patient name: Brianna Griffin MRN [id number]  Date of birth: 12-12-1985 ?Chief Complaint:   ?Gynecologic Exam ? ?History of Present Illness:   ?Brianna Griffin is a 36 y.o. [phone] female being seen today for a routine well-woman exam.  ? ?No period with Depot ?Notes breast pain in the morning, occasional tenderness to palpation.  Currently breast feeding.  Denies bloody discharge. No fever or chills.  No other acute complaints ?  ?No LMP recorded. (Menstrual status: Lactating). ?Denies issues with her menses ?The current method of family planning is Depo-Provera injections.  ? ? ?Last pap 2020.  ?Last mammogram: n/a. ?Last colonoscopy: n/a ? ? ?  09/04/2021  ?  3:00 PM 09/21/2020  ?  8:56 AM 06/29/2020  ? 12:04 PM 06/08/2020  ?  9:49 AM 12/27/2019  ?  3:17 PM  ?Depression screen PHQ 2/9  ?Decreased Interest 0 0 0 0 0  ?Down, Depressed, Hopeless 1 1 0 0 0  ?PHQ - 2 Score 1 1 0 0 0  ?Altered sleeping 0 0 0 0 0  ?Tired, decreased energy 0 '1 1 2 1  '$ ?Change in appetite 1 0 '1 2 1  '$ ?Feeling bad or failure about yourself  0 2 0  0  ?Trouble concentrating 0 0 0  0  ?Moving slowly or fidgety/restless 0 0 0  0  ?Suicidal thoughts 0 0 0  0  ?PHQ-9 Score '2 4 2 4 2  '$ ?Difficult doing work/chores   Not difficult at all  Not difficult at all  ? ? ? ? ?Review of Systems:   ?Pertinent items are noted in HPI ?Denies any headaches, blurred vision, fatigue, shortness of breath, chest pain, abdominal pain, bowel movements, urination, or intercourse unless otherwise stated above. ? ?Pertinent History Reviewed:  ?Reviewed past medical,surgical, social and family history.  ?Reviewed problem list, medications and allergies. ?Physical Assessment:  ? ?Vitals:  ? 09/04/21 1452  ?BP: 137/90  ?Pulse: 96  ?Weight: 151 lb (68.5 kg)  ?Height: '5\' 4"'$  (1.626 m)  ?Body mass index is 25.92 kg/m?. ?  ?     Physical Examination:  ? General appearance - well appearing, and in no distress ? Mental status - alert, oriented to person, place,  and time ? Psych:  She has a normal mood and affect ? Skin - warm and dry, normal color, no suspicious lesions noted ? Chest - effort normal, all lung fields clear to auscultation bilaterally ? Heart - normal rate and regular rhythm ? Neck:  midline trachea, no thyromegaly or nodules ? Breasts - breasts appear normal, no suspicious masses, no skin or nipple changes or  axillary nodes, breast engorgement noted (left) ? Abdomen - soft, nontender, nondistended, no masses or organomegaly ? Pelvic - VULVA: normal appearing vulva with no masses, tenderness or lesions  VAGINA: normal appearing vagina with normal color and discharge, no lesions  CERVIX: normal appearing cervix without discharge or lesions, no CMT ? Thin prep pap is done with HR HPV cotesting ? UTERUS: uterus is felt to be normal size, shape, consistency and nontender  ? ADNEXA: No adnexal masses or tenderness noted. ? Extremities:  No swelling or varicosities noted ? ?Chaperone: Levy Pupa   ? ? ?Assessment & Plan:  ?1) Well-Woman Exam ?-pap collected, reviewed screening guidelines ?-screening lab work ordered to be completed fasting ? ?2) Contraceptive management ?-continue with Depot ? ?3) Breast pain ?-suspect breast feeding related ?-reviewed conservative management ?-if continue post breastfeeding RTC ? ?Orders Placed  This Encounter  ?Procedures  ? Lipid Profile  ? CBC  ? Comprehensive metabolic panel  ? TSH  ? ? ?Meds: No orders of the defined types were placed in this encounter. ? ? ?Follow-up: Return in about 1 year (around 09/05/2022) for Annual. ? ? ?Janyth Pupa, DO ?Attending [place] ?[facility] ? ? ?

## 2021-09-06 ENCOUNTER — Telehealth: Payer: Self-pay | Admitting: Adult Health

## 2021-09-06 LAB — CYTOLOGY - PAP
Comment: NEGATIVE
Diagnosis: NEGATIVE
High risk HPV: NEGATIVE

## 2021-09-06 NOTE — Telephone Encounter (Signed)
I she taking in am or pm? With food? How high of a heart rate? ?

## 2021-09-06 NOTE — Telephone Encounter (Signed)
Pt called at 9:05 and said taht she has been taking 25 mg of the zoloft for 1 week. She is suppose to go to 50 mg today. However over the last week 3 separate times she has experienced fast heart rate high anxiety, hot flash and nausea. She doesn't know what to do and wanted to know what to do. Please give her a call at [phone] ?

## 2021-09-06 NOTE — Telephone Encounter (Signed)
LVM to RC 

## 2021-09-06 NOTE — Telephone Encounter (Signed)
Patient has been taking at night with her Wellbutrin and taking 1-2 hours after eating. She has not measured her heart rate, says she just feels her heart racing. She said she will take medication in the a.m. and let us know if that makes a difference.  ?

## 2021-09-06 NOTE — Telephone Encounter (Signed)
See message and advise

## 2021-09-10 NOTE — Telephone Encounter (Deleted)
Patient notified of Rx.  

## 2021-09-11 NOTE — Telephone Encounter (Signed)
Called and LVM to call and discuss.

## 2021-09-11 NOTE — Telephone Encounter (Signed)
Thank you :)

## 2021-09-11 NOTE — Telephone Encounter (Signed)
Pt called back again at 4:15p and LVM that she missed Gina's call. ? ?Next appt 5/4 ? ?

## 2021-09-11 NOTE — Telephone Encounter (Signed)
See messages - Every time I talk to patient I get a different answer, so not sure what is going on. She is a new patient and not much history to go on, but she was having SE with 25 mg Zoloft. Was going go take Zoloft and Wellbutrin at different times but hasn't.  ?

## 2021-09-12 NOTE — Telephone Encounter (Signed)
Didn't know if you were able to contact with her yesterday.  ?

## 2021-09-12 NOTE — Telephone Encounter (Signed)
No, I have not talked to her yet.  ?

## 2021-09-17 ENCOUNTER — Ambulatory Visit: Payer: No Typology Code available for payment source

## 2021-09-18 ENCOUNTER — Other Ambulatory Visit (HOSPITAL_COMMUNITY): Payer: Self-pay

## 2021-09-20 ENCOUNTER — Ambulatory Visit: Payer: No Typology Code available for payment source

## 2021-09-20 ENCOUNTER — Encounter: Payer: Self-pay | Admitting: Adult Health

## 2021-09-20 ENCOUNTER — Ambulatory Visit (INDEPENDENT_AMBULATORY_CARE_PROVIDER_SITE_OTHER): Payer: No Typology Code available for payment source | Admitting: Adult Health

## 2021-09-20 DIAGNOSIS — F331 Major depressive disorder, recurrent, moderate: Secondary | ICD-10-CM | POA: Diagnosis not present

## 2021-09-20 DIAGNOSIS — F411 Generalized anxiety disorder: Secondary | ICD-10-CM | POA: Diagnosis not present

## 2021-09-20 NOTE — Progress Notes (Signed)
Hartley Barefoot ?[id number] ?[birth year] ?36 y.o. ? ?Subjective:  ? ?Patient ID:  Brianna Griffin is a 36 y.o. (DOB 12/18/1985) female. ? ?Chief Complaint: No chief complaint on file. ? ? ?HPI ?Brianna Griffin presents to the office today for follow-up of MDD and GAD. ? ?Describes mood today as "ok". Pleasant. Tearful at times. Mood symptoms - reports decreased depression and  anxiety. Feels irritable at times - "one bout every day". Reports decreased worry and ruminations. Has transitioned off the Lexapro and onto the Zoloft. Plans to move the Wellbutrin to mornings. Family doing well. Stable interest and motivation. Taking medications as prescribed.  ?Energy levels lower - more tired. Active, does not have a regular exercise routine.  ?Enjoys some usual interests and activities. Married 8 years, second marriage. Lives with husband and 3 children - 15, 6, 8 months months. Spending time with family. ?Appetite adequate. Weight stable - 150.4 pounds ?Sleeps well most nights. Averages 7 hours. ?Focus and concentration "pretty good". Completing tasks. Managing aspects of household. Works F/T for [employer] x 5 years. ?Denies SI or HI.  ?Denies AH or VH. ?Denies self harm. ? ?Seeing therapist - Alphia Kava ? ? ?GAD-7   ? ?[place] Office Visit from 09/04/2021 in [facility] Routine Prenatal from 09/21/2020 in [place] Visit from 06/29/2020 in [place] Visit from 12/27/2019 in [place] Visit from 12/13/2019 in [place]  ?Total GAD-7 Score '3 13 2 2 '$ 0  ? ?  ? ?PHQ2-9   ? ?[place] Office Visit from 09/04/2021 in [facility] Routine Prenatal from 09/21/2020 in [place] Office Visit from 06/29/2020 in [facility] Initial Prenatal from 06/08/2020 in [place] Visit from 12/27/2019 in [facility]  ?PHQ-2 Total Score 1 1 0 0 0  ?PHQ-9 Total Score '2 4 2 4 2  '$ ? ?   ? ?Flowsheet Row Admission (Discharged) from 12/06/2020 in [facility] 4S Mother Baby Unit Admission (Discharged) from 11/11/2020 in [facility]  ?C-SSRS RISK CATEGORY No Risk No Risk  ? ?  ?  ? ?Review of Systems:  ?Review of Systems  ?Musculoskeletal:  Negative for gait problem.  ?Neurological:  Negative for tremors.  ?Psychiatric/Behavioral:    ?     Please refer to HPI  ? ?Medications: I have reviewed the patient's current medications. ? ?Current Outpatient Medications  ?Medication Sig Dispense Refill  ? acetaminophen (TYLENOL) 325 MG tablet Take 2 tablets (650 mg total) by mouth every 4 (four) hours as needed (for pain scale < 4). 30 tablet 0  ? albuterol (VENTOLIN HFA) 108 (90 Base) MCG/ACT inhaler Inhale 2 puffs into the lungs every 6 (six) hours as needed for wheezing or shortness of breath. 8 g 0  ? buPROPion (WELLBUTRIN XL) 300 MG 24 hr tablet TAKE 1 TABLET BY MOUTH AT BEDTIME 90 tablet 3  ? Cetirizine HCl (ZYRTEC PO) Take by mouth.    ? medroxyPROGESTERone Acetate 150 MG/ML SUSY Inject 1 mL (150 mg total) into the muscle every 3 (three) months. 1 mL 3  ? Prenatal MV-Min-Fe Fum-FA-DHA (PRENATAL 1 PO) Take by mouth.    ? sertraline (ZOLOFT) 50 MG tablet Take 1 tablet (50 mg total) by mouth daily. 30 tablet 2  ? ?No current facility-administered medications for this visit.  ? ? ?Medication Side Effects: None ? ?Allergies:  ?Allergies  ?Allergen Reactions  ? Ibuprofen  Hives  ? ? ?Past Medical History:  ?Diagnosis Date  ? Allergy   ? Anxiety   ? Asthma   ? Depression   ? Gastritis   ? GERD (gastroesophageal reflux disease)   ? 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 IBS (irritable bowel syndrome)   ? Migraine headache   ? Migraines   ? Miscarriage 09/09/2013  ? Pregnant 12/28/2014  ? Supervision of normal pregnancy in first trimester 03/04/2014  ? Supervision of other normal pregnancy 02/02/2015  ? Thyroid disease   ? hypothyroid  ? Vaginal discharge 01/31/2015  ? ? ?Past Medical History, Surgical history,  Social history, and Family history were reviewed and updated as appropriate.  ? ?Please see review of systems for further details on the patient's review from today.  ? ?Objective:  ? ?Physical Exam:  ?There were no vitals taken for this visit. ? ?Physical Exam ?Constitutional:   ?   General: She is not in acute distress. ?Musculoskeletal:     ?   General: No deformity.  ?Neurological:  ?   Mental Status: She is alert and oriented to person, place, and time.  ?   Coordination: Coordination normal.  ?Psychiatric:     ?   Attention and Perception: Attention and perception normal. She does not perceive auditory or visual hallucinations.     ?   Mood and Affect: Mood normal. Mood is not anxious or depressed. Affect is not labile, blunt, angry or inappropriate.     ?   Speech: Speech normal.     ?   Behavior: Behavior normal.     ?   Thought Content: Thought content normal. Thought content is not paranoid or delusional. Thought content does not include homicidal or suicidal ideation. Thought content does not include homicidal or suicidal plan.     ?   Cognition and Memory: Cognition and memory normal.     ?   Judgment: Judgment normal.  ?   Comments: Insight intact  ? ? ?Lab Review:  ?   ?Component Value Date/Time  ? NA 134 (L) 12/06/2020 0229  ? NA 137 06/14/2020 1651  ? K 3.4 (L) 12/06/2020 0229  ? CL 103 12/06/2020 0229  ? CO2 20 (L) 12/06/2020 0229  ? GLUCOSE 101 (H) 12/06/2020 0229  ? BUN 7 12/06/2020 0229  ? BUN 7 06/14/2020 1651  ? CREATININE 0.64 12/06/2020 0229  ? CALCIUM 9.3 12/06/2020 0229  ? PROT 6.8 12/06/2020 0229  ? PROT 7.4 06/14/2020 1651  ? ALBUMIN 2.9 (L) 12/06/2020 0229  ? ALBUMIN 4.2 06/14/2020 1651  ? AST 18 12/06/2020 0229  ? ALT 17 12/06/2020 0229  ? ALKPHOS 242 (H) 12/06/2020 0229  ? BILITOT 0.8 12/06/2020 0229  ? BILITOT 0.4 06/14/2020 1651  ? GFRNONAA >60 12/06/2020 0229  ? GFRAA 138 06/14/2020 1651  ? ? ?   ?Component Value Date/Time  ? WBC 13.2 (H) 12/06/2020 0229  ? RBC 3.95 12/06/2020 0229   ? HGB 12.4 12/06/2020 0229  ? HGB 11.8 09/21/2020 0826  ? HCT 35.4 (L) 12/06/2020 0229  ? HCT 34.8 09/21/2020 0826  ? PLT 198 12/06/2020 0229  ? PLT 203 09/21/2020 0826  ? MCV 89.6 12/06/2020 0229  ? MCV 93 09/21/2020 0826  ? MCH 31.4 12/06/2020 0229  ? MCHC 35.0 12/06/2020 0229  ? RDW 13.3 12/06/2020 0229  ? RDW 12.4 09/21/2020 0826  ? LYMPHSABS 1.9 06/14/2020 1651  ? EOSABS 0.1 06/14/2020 1651  ? BASOSABS 0.0 06/14/2020 1651  ? ? ?  No results found for: POCLITH, LITHIUM  ? ?No results found for: PHENYTOIN, PHENOBARB, VALPROATE, CBMZ  ? ?.res ?Assessment: Plan:   ? ?Plan: ? ?PDMP reviewed ? ?Wellbutrin XL '300mg'$  every morning x 2 years - family history of seizure - moving Wellbutrin to morning time starting tomorrow. ?Zoloft '50mg'$  daily. ? ?Hydroyxzine - not using ? ?Time spent with patient was 20 minutes. Greater than 50% of face to face time with patient was spent on counseling and coordination of care.   ? ?RTC 4 weeks ? ?Patient advised to contact office with any questions, adverse effects, or acute worsening in signs and symptoms.  ?There are no diagnoses linked to this encounter.  ? ?Please see After Visit Summary for patient specific instructions. ? ?Future Appointments  ?Date Time Provider [place]  ?09/24/2021  2:30 PM [facility] NURSE [facility]  ? ? ?No orders of the defined types were placed in this encounter. ? ? ?------------------------------- ?

## 2021-09-24 ENCOUNTER — Ambulatory Visit (INDEPENDENT_AMBULATORY_CARE_PROVIDER_SITE_OTHER): Payer: No Typology Code available for payment source | Admitting: *Deleted

## 2021-09-24 DIAGNOSIS — Z3042 Encounter for surveillance of injectable contraceptive: Secondary | ICD-10-CM

## 2021-09-24 MED ORDER — MEDROXYPROGESTERONE ACETATE 150 MG/ML IM SUSP
150.0000 mg | Freq: Once | INTRAMUSCULAR | Status: AC
  Administered 2021-09-24: 150 mg via INTRAMUSCULAR

## 2021-09-24 NOTE — Progress Notes (Signed)
? ?  NURSE VISIT- INJECTION ? ?SUBJECTIVE:  ?Brianna Griffin is a 36 y.o. [phone] female here for a Depo Provera for contraception/period management. She is a GYN patient.  ? ?OBJECTIVE:  ?There were no vitals taken for this visit.  ?Appears well, in no apparent distress ? ?Injection administered in: Right deltoid ? ?Meds ordered this encounter  ?Medications  ? medroxyPROGESTERone (DEPO-PROVERA) injection 150 mg  ? ? ?ASSESSMENT: ?GYN patient Depo Provera for contraception/period management ?PLAN: ?Follow-up: in 11-13 weeks for next Depo  ? ?Levy Pupa  ?09/24/2021 ?3:26 PM ? ?

## 2021-09-28 ENCOUNTER — Other Ambulatory Visit: Payer: Self-pay | Admitting: *Deleted

## 2021-09-28 ENCOUNTER — Encounter: Payer: Self-pay | Admitting: Obstetrics & Gynecology

## 2021-09-28 DIAGNOSIS — Z13220 Encounter for screening for lipoid disorders: Secondary | ICD-10-CM

## 2021-09-28 DIAGNOSIS — Z01419 Encounter for gynecological examination (general) (routine) without abnormal findings: Secondary | ICD-10-CM

## 2021-09-28 DIAGNOSIS — Z1329 Encounter for screening for other suspected endocrine disorder: Secondary | ICD-10-CM

## 2021-09-28 DIAGNOSIS — Z131 Encounter for screening for diabetes mellitus: Secondary | ICD-10-CM

## 2021-10-01 ENCOUNTER — Other Ambulatory Visit (HOSPITAL_COMMUNITY): Payer: Self-pay

## 2021-10-12 ENCOUNTER — Other Ambulatory Visit: Payer: Self-pay | Admitting: Obstetrics & Gynecology

## 2021-10-13 LAB — CBC
Hematocrit: 40 % (ref 34.0–46.6)
Hemoglobin: 14.1 g/dL (ref 11.1–15.9)
MCH: 30.9 pg (ref 26.6–33.0)
MCHC: 35.3 g/dL (ref 31.5–35.7)
MCV: 88 fL (ref 79–97)
Platelets: 262 10*3/uL (ref 150–450)
RBC: 4.57 x10E6/uL (ref 3.77–5.28)
RDW: 12.7 % (ref 11.7–15.4)
WBC: 8 10*3/uL (ref 3.4–10.8)

## 2021-10-13 LAB — COMPREHENSIVE METABOLIC PANEL
ALT: 13 IU/L (ref 0–32)
AST: 12 IU/L (ref 0–40)
Albumin/Globulin Ratio: 1.9 (ref 1.2–2.2)
Albumin: 4.7 g/dL (ref 3.8–4.8)
Alkaline Phosphatase: 111 IU/L (ref 44–121)
BUN/Creatinine Ratio: 11 (ref 9–23)
BUN: 11 mg/dL (ref 6–20)
Bilirubin Total: 0.6 mg/dL (ref 0.0–1.2)
CO2: 22 mmol/L (ref 20–29)
Calcium: 9.4 mg/dL (ref 8.7–10.2)
Chloride: 102 mmol/L (ref 96–106)
Creatinine, Ser: 1.04 mg/dL — ABNORMAL HIGH (ref 0.57–1.00)
Globulin, Total: 2.5 g/dL (ref 1.5–4.5)
Glucose: 85 mg/dL (ref 70–99)
Potassium: 4.4 mmol/L (ref 3.5–5.2)
Sodium: 139 mmol/L (ref 134–144)
Total Protein: 7.2 g/dL (ref 6.0–8.5)
eGFR: 72 mL/min/{1.73_m2} (ref >59)

## 2021-10-13 LAB — LIPID PANEL
Chol/HDL Ratio: 2.8 ratio (ref 0.0–4.4)
Cholesterol, Total: 176 mg/dL (ref 100–199)
HDL: 64 mg/dL (ref >39)
LDL Chol Calc (NIH): 102 mg/dL — ABNORMAL HIGH (ref 0–99)
Triglycerides: 53 mg/dL (ref 0–149)
VLDL Cholesterol Cal: 10 mg/dL (ref 5–40)

## 2021-10-13 LAB — TSH: TSH: 2.12 u[IU]/mL (ref 0.450–4.500)

## 2021-10-16 ENCOUNTER — Encounter: Payer: Self-pay | Admitting: Nurse Practitioner

## 2021-10-23 ENCOUNTER — Ambulatory Visit: Payer: No Typology Code available for payment source | Admitting: Family Medicine

## 2021-10-25 ENCOUNTER — Other Ambulatory Visit (HOSPITAL_COMMUNITY): Payer: Self-pay

## 2021-10-25 ENCOUNTER — Ambulatory Visit (INDEPENDENT_AMBULATORY_CARE_PROVIDER_SITE_OTHER): Payer: No Typology Code available for payment source | Admitting: Adult Health

## 2021-10-25 ENCOUNTER — Encounter: Payer: Self-pay | Admitting: Adult Health

## 2021-10-25 DIAGNOSIS — F411 Generalized anxiety disorder: Secondary | ICD-10-CM | POA: Diagnosis not present

## 2021-10-25 DIAGNOSIS — F331 Major depressive disorder, recurrent, moderate: Secondary | ICD-10-CM

## 2021-10-25 MED ORDER — SERTRALINE HCL 50 MG PO TABS
50.0000 mg | ORAL_TABLET | Freq: Every day | ORAL | 1 refills | Status: DC
  Filled 2021-10-25: qty 90, 90d supply, fill #0
  Filled 2022-04-18: qty 90, 90d supply, fill #1

## 2021-10-25 NOTE — Progress Notes (Signed)
Brianna Griffin [id number] Jul 21, 1985 36 y.o.  Subjective:   Patient ID:  Brianna Griffin is a 37 y.o. (DOB December 21, 1985) female.  Chief Complaint: No chief complaint on file.   HPI Brianna Griffin presents to the office today for follow-up of MDD and GAD.  Describes mood today as "ok". Pleasant. Tearful at times. Mood symptoms - reports decreased depression. Still feeling "fairly" anxious. Decreased irritability - "not as bad as it was before". Reports worry and ruminations - "quite often". Mood consistent. Reporting some stressors with children being out of school - has some help. Family doing well. Stable interest and motivation. Taking medications as prescribed.  Energy levels improved. Active, has started exercising again - walk -  free weights..  Enjoys some usual interests and activities. Married 8 years, second marriage. Lives with husband and 3 children - 68, 6 and 10 months months. Spending time with family. Appetite adequate. Weight stable - 150.4 pounds Sleeps well most nights. Averages 7 hours. Focus and concentration "struggling a little bit". Some concerns regarding ADD. Completing tasks. Managing aspects of household. Works F/T for [employer] x 5 years. Denies SI or HI.  Denies AH or VH. Denies self harm.  Seeing therapist - Brianna Griffin   GAD-7    [name] Office Visit from 09/04/2021 in [facility] Routine Prenatal from 09/21/2020 in [facility] Office Visit from 06/29/2020 in [facility] Office Visit from 12/27/2019 in [facility] Office Visit from 12/13/2019 in [facility]  Total GAD-7 Score '3 13 2 2 '$ 0      PHQ2-9    [place] Visit from 09/04/2021 in [facility] Routine Prenatal from 09/21/2020 in [facility] Visit from 06/29/2020 in [facility] Initial Prenatal from 06/08/2020 in [facility] Visit from 12/27/2019 in [facility]  PHQ-2 Total Score 1 1 0 0 0  PHQ-9 Total Score '2 4 2 4 2      '$ Flowsheet Row Admission (Discharged) from 12/06/2020 in [facility] 4S Mother Baby Unit Admission (Discharged) from 11/11/2020 in [facility]  C-SSRS RISK CATEGORY No Risk No Risk        Review of Systems:  Review of Systems  Musculoskeletal:  Negative for gait problem.  Neurological:  Negative for tremors.  Psychiatric/Behavioral:         Please refer to HPI    Medications: I have reviewed the patient's current medications.  Current Outpatient Medications  Medication Sig Dispense Refill   acetaminophen (TYLENOL) 325 MG tablet Take 2 tablets (650 mg total) by mouth every 4 (four) hours as needed (for pain scale < 4). 30 tablet 0   albuterol (VENTOLIN HFA) 108 (90 Base) MCG/ACT inhaler Inhale 2 puffs into the lungs every 6 (six) hours as needed for wheezing or shortness of breath. 8 g 0   buPROPion (WELLBUTRIN XL) 300 MG 24 hr tablet TAKE 1 TABLET BY MOUTH AT BEDTIME 90 tablet 3   Cetirizine HCl (ZYRTEC PO) Take by mouth.     medroxyPROGESTERone Acetate 150 MG/ML SUSY Inject 1 mL (150 mg total) into the muscle every 3 (three) months. 1 mL 3   Prenatal MV-Min-Fe Fum-FA-DHA (PRENATAL 1 PO) Take by mouth.     sertraline (ZOLOFT) 50 MG tablet Take 1 tablet (50 mg total) by mouth daily. 90 tablet 1   No current facility-administered medications for this visit.    Medication Side Effects:  None  Allergies:  Allergies  Allergen Reactions   Ibuprofen Hives    Past Medical History:  Diagnosis Date   Allergy    Anxiety    Asthma    Depression    Gastritis    GERD (gastroesophageal reflux disease)    Hymenal remnant 07/19/2014   IBS (irritable bowel syndrome)    Migraine headache    Migraines    Miscarriage 09/09/2013   Pregnant 12/28/2014   Supervision of normal pregnancy in first trimester 03/04/2014   Supervision of other normal pregnancy 02/02/2015   Thyroid disease    hypothyroid    Vaginal discharge 01/31/2015    Past Medical History, Surgical history, Social history, and Family history were reviewed and updated as appropriate.   Please see review of systems for further details on the patient's review from today.   Objective:   Physical Exam:  There were no vitals taken for this visit.  Physical Exam Constitutional:      General: She is not in acute distress. Musculoskeletal:        General: No deformity.  Neurological:     Mental Status: She is alert and oriented to person, place, and time.     Coordination: Coordination normal.  Psychiatric:        Attention and Perception: Attention and perception normal. She does not perceive auditory or visual hallucinations.        Mood and Affect: Mood normal. Mood is not anxious or depressed. Affect is not labile, blunt, angry or inappropriate.        Speech: Speech normal.        Behavior: Behavior normal.        Thought Content: Thought content normal. Thought content is not paranoid or delusional. Thought content does not include homicidal or suicidal ideation. Thought content does not include homicidal or suicidal plan.        Cognition and Memory: Cognition and memory normal.        Judgment: Judgment normal.     Comments: Insight intact     Lab Review:     Component Value Date/Time   NA 139 10/12/2021 0810   K 4.4 10/12/2021 0810   CL 102 10/12/2021 0810   CO2 22 10/12/2021 0810   GLUCOSE 85 10/12/2021 0810   GLUCOSE 101 (H) 12/06/2020 0229   BUN 11 10/12/2021 0810   CREATININE 1.04 (H) 10/12/2021 0810   CALCIUM 9.4 10/12/2021 0810   PROT 7.2 10/12/2021 0810   ALBUMIN 4.7 10/12/2021 0810   AST 12 10/12/2021 0810   ALT 13 10/12/2021 0810   ALKPHOS 111 10/12/2021 0810   BILITOT 0.6 10/12/2021 0810   GFRNONAA >60 12/06/2020 0229   GFRAA 138 06/14/2020 1651       Component Value Date/Time   WBC 8.0 10/12/2021 0810   WBC 13.2 (H) 12/06/2020 0229   RBC 4.57 10/12/2021 0810   RBC 3.95 12/06/2020  0229   HGB 14.1 10/12/2021 0810   HCT 40.0 10/12/2021 0810   PLT 262 10/12/2021 0810   MCV 88 10/12/2021 0810   MCH 30.9 10/12/2021 0810   MCH 31.4 12/06/2020 0229   MCHC 35.3 10/12/2021 0810   MCHC 35.0 12/06/2020 0229   RDW 12.7 10/12/2021 0810   LYMPHSABS 1.9 06/14/2020 1651   EOSABS 0.1 06/14/2020 1651   BASOSABS 0.0 06/14/2020 1651    No results found for: "POCLITH", "LITHIUM"   No results found for: "PHENYTOIN", "PHENOBARB", "VALPROATE", "CBMZ"   .res Assessment: Plan:  Plan:  PDMP reviewed  Wellbutrin XL '300mg'$  every morning Zoloft '50mg'$  daily  Time spent with patient was 20 minutes. Greater than 50% of face to face time with patient was spent on counseling and coordination of care.    RTC 4 weeks  Patient advised to contact office with any questions, adverse effects, or acute worsening in signs and symptoms.  Diagnoses and all orders for this visit:  Generalized anxiety disorder -     sertraline (ZOLOFT) 50 MG tablet; Take 1 tablet (50 mg total) by mouth daily.  Major depressive disorder, recurrent episode, moderate (HCC) -     sertraline (ZOLOFT) 50 MG tablet; Take 1 tablet (50 mg total) by mouth daily.     Please see After Visit Summary for patient specific instructions.  Future Appointments  Date Time Provider [place]  12/07/2021  3:00 PM Nilda Simmer, NP [facility]  12/17/2021  3:50 PM [facility] NURSE [facility]    No orders of the defined types were placed in this encounter.   -------------------------------

## 2021-11-27 ENCOUNTER — Other Ambulatory Visit (HOSPITAL_COMMUNITY): Payer: Self-pay

## 2021-11-27 ENCOUNTER — Ambulatory Visit (INDEPENDENT_AMBULATORY_CARE_PROVIDER_SITE_OTHER): Payer: No Typology Code available for payment source | Admitting: Adult Health

## 2021-11-27 DIAGNOSIS — F902 Attention-deficit hyperactivity disorder, combined type: Secondary | ICD-10-CM | POA: Diagnosis not present

## 2021-11-27 DIAGNOSIS — F411 Generalized anxiety disorder: Secondary | ICD-10-CM

## 2021-11-27 DIAGNOSIS — F331 Major depressive disorder, recurrent, moderate: Secondary | ICD-10-CM | POA: Diagnosis not present

## 2021-11-27 MED ORDER — AMPHETAMINE-DEXTROAMPHET ER 20 MG PO CP24
20.0000 mg | ORAL_CAPSULE | Freq: Every day | ORAL | 0 refills | Status: DC
  Filled 2021-11-27: qty 30, 30d supply, fill #0

## 2021-11-27 NOTE — Progress Notes (Signed)
Brianna Griffin [id number] [birth year] 36 y.o.  Subjective:   Patient ID:  Brianna Griffin is a 36 y.o. (DOB [birth year]) female.  Chief Complaint: No chief complaint on file.   HPI [name] presents to the office today for follow-up of MDD and GAD.  Describes mood today as "ok". Pleasant. Tearful at times. Mood symptoms - denies depression. Feels anxious at times. Reports irritability - frustrated when not able to get things done - pulled in 20 different directions. Reports worry and ruminations - "better". Mood consistent. Reporting some stressors with children being out of school - niece helping out during the day. Family doing well. Stable interest and motivation. Taking medications as prescribed.  Energy levels pretty good. Active, has started exercising again - walk -  free weights..  Enjoys some usual interests and activities. Married 8 years, second marriage. Lives with husband and 3 children - 64, 6 and 10 months months. Spending time with family. Appetite adequate. Weight stable - 150.4 pounds Sleeps well most nights. Averages 7 hours. Focus and concentration difficulties. Some concerns regarding ADD. Completing tasks. Managing aspects of household. Works F/T for [employer] x 5 years. Denies SI or HI.  Denies AH or VH. Denies self harm.  Seeing therapist - Alphia Kava   GAD-7    [place] Office Visit from 09/04/2021 in [facility] Routine Prenatal from 09/21/2020 in [facility] Office Visit from 06/29/2020 in [facility] Office Visit from 12/27/2019 in [place] Office Visit from 12/13/2019 in [place]  Total GAD-7 Score '3 13 2 2 '$ 0      PHQ2-9    [place] Office Visit from 09/04/2021 in [place] Routine Prenatal from 09/21/2020 in [place] from 06/29/2020 in [place] Initial Prenatal from 06/08/2020 in [place] Visit  from 12/27/2019 in [place] [place]  PHQ-2 Total Score 1 1 0 0 0  PHQ-9 Total Score '2 4 2 4 2      '$ Flowsheet Row Admission (Discharged) from 12/06/2020 in [facility] 4S Mother Baby Unit Admission (Discharged) from 11/11/2020 in [facility]  C-SSRS RISK CATEGORY No Risk No Risk        Review of Systems:  Review of Systems  Medications: I have reviewed the patient's current medications.  Current Outpatient Medications  Medication Sig Dispense Refill   acetaminophen (TYLENOL) 325 MG tablet Take 2 tablets (650 mg total) by mouth every 4 (four) hours as needed (for pain scale < 4). 30 tablet 0   albuterol (VENTOLIN HFA) 108 (90 Base) MCG/ACT inhaler Inhale 2 puffs into the lungs every 6 (six) hours as needed for wheezing or shortness of breath. 8 g 0   buPROPion (WELLBUTRIN XL) 300 MG 24 hr tablet TAKE 1 TABLET BY MOUTH AT BEDTIME 90 tablet 3   Cetirizine HCl (ZYRTEC PO) Take by mouth.     medroxyPROGESTERone Acetate 150 MG/ML SUSY Inject 1 mL (150 mg total) into the muscle every 3 (three) months. 1 mL 3   Prenatal MV-Min-Fe Fum-FA-DHA (PRENATAL 1 PO) Take by mouth.     sertraline (ZOLOFT) 50 MG tablet Take 1 tablet (50 mg total) by mouth daily. 90 tablet 1   No current facility-administered medications for this visit.    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   Please see review of systems for further details on the patient's review from today.   Objective:   Physical Exam:  There were no vitals taken for this visit.  Physical Exam Constitutional:      General: She is not in acute distress. Musculoskeletal:        General: No deformity.  Neurological:     Mental Status: She is alert and oriented to person, place, and time.     Coordination: Coordination normal.  Psychiatric:        Attention and Perception: Attention and perception normal. She does not perceive auditory or visual hallucinations.        Mood and Affect: Mood normal. Mood is not anxious or depressed. Affect is not labile, blunt, angry or inappropriate.        Speech: Speech normal.        Behavior: Behavior normal.        Thought Content: Thought content normal. Thought content is not paranoid or delusional. Thought content does not include homicidal or suicidal ideation. Thought content does not include homicidal or suicidal plan.        Cognition and Memory: Cognition and memory normal.        Judgment: Judgment normal.     Comments: Insight intact     Lab Review:     Component Value Date/Time   NA 139 10/12/2021 0810   K 4.4 10/12/2021 0810   CL 102 10/12/2021 0810   CO2 22 10/12/2021 0810   GLUCOSE 85 10/12/2021 0810   GLUCOSE 101 (H) 12/06/2020 0229   BUN 11 10/12/2021 0810   CREATININE 1.04 (H) 10/12/2021 0810   CALCIUM 9.4 10/12/2021 0810   PROT 7.2 10/12/2021 0810   ALBUMIN 4.7 10/12/2021 0810   AST 12 10/12/2021 0810   ALT 13 10/12/2021 0810   ALKPHOS 111 10/12/2021 0810   BILITOT 0.6 10/12/2021 0810   GFRNONAA >60 12/06/2020 0229   GFRAA 138 06/14/2020 1651       Component Value Date/Time   WBC 8.0 10/12/2021 0810   WBC 13.2 (H) 12/06/2020 0229   RBC 4.57 10/12/2021 0810   RBC 3.95 12/06/2020 0229   HGB 14.1 10/12/2021 0810   HCT 40.0 10/12/2021 0810   PLT 262 10/12/2021 0810   MCV 88 10/12/2021  0810   MCH 30.9 10/12/2021 0810   MCH 31.4 12/06/2020 0229   MCHC 35.3 10/12/2021 0810   MCHC 35.0 12/06/2020 0229   RDW 12.7 10/12/2021 0810   LYMPHSABS 1.9 06/14/2020 1651   EOSABS 0.1 06/14/2020 1651   BASOSABS 0.0 06/14/2020 1651    No results found for: "POCLITH", "LITHIUM"   No results found for: "PHENYTOIN", "PHENOBARB", "VALPROATE", "CBMZ"   .res Assessment: Plan:    Plan:  PDMP reviewed  Add Adderall XR '20mg'$  daily - plans to stop breast feeding before  Wellbutrin XL '300mg'$  every  morning - denies seizure history  Zoloft '50mg'$  daily  Screening tools:  [facility] ADD test 39/58 - ADD possible Connors positive for ADD inattentive type  Time spent with patient was 25 minutes. Greater than 50% of face to face time with patient was spent on counseling and coordination of care.    RTC 8 weeks  Patient advised to contact office with any questions, adverse effects, or acute worsening in signs and symptoms.   There are no diagnoses linked to this encounter.   Please see After Visit Summary for patient specific instructions.  Future Appointments  Date Time Provider [place]  12/07/2021  3:00 PM Nilda Simmer, NP [facility]  12/17/2021  3:50 PM [facility] NURSE [facility]    No orders of the defined types were placed in this encounter.   -------------------------------

## 2021-11-30 ENCOUNTER — Other Ambulatory Visit (HOSPITAL_COMMUNITY): Payer: Self-pay

## 2021-11-30 ENCOUNTER — Encounter (HOSPITAL_COMMUNITY): Payer: Self-pay

## 2021-12-04 ENCOUNTER — Other Ambulatory Visit (HOSPITAL_COMMUNITY): Payer: Self-pay

## 2021-12-07 ENCOUNTER — Encounter: Payer: Self-pay | Admitting: Nurse Practitioner

## 2021-12-07 ENCOUNTER — Ambulatory Visit (INDEPENDENT_AMBULATORY_CARE_PROVIDER_SITE_OTHER): Payer: No Typology Code available for payment source | Admitting: Nurse Practitioner

## 2021-12-07 VITALS — BP 125/84 | HR 82 | Temp 97.7°F | Wt 155.8 lb

## 2021-12-07 DIAGNOSIS — R7989 Other specified abnormal findings of blood chemistry: Secondary | ICD-10-CM

## 2021-12-07 NOTE — Progress Notes (Signed)
   Subjective:    Patient ID: Brianna Griffin, female    DOB: 08/25/1985, 36 y.o.   MRN: [id number]  HPI  Patient here for follow up on labs.  Referred by Dr. Nelda Marseille to discuss her creatinine and kidney tests.       Objective:   Physical Exam NAD.  Alert, oriented.  Results for orders placed or performed in visit on 10/12/21  Comprehensive metabolic panel  Result Value Ref Range   Glucose 85 70 - 99 mg/dL   BUN 11 6 - 20 mg/dL   Creatinine, Ser 1.04 (H) 0.57 - 1.00 mg/dL   eGFR 72 >59 mL/min/1.73   BUN/Creatinine Ratio 11 9 - 23   Sodium 139 134 - 144 mmol/L   Potassium 4.4 3.5 - 5.2 mmol/L   Chloride 102 96 - 106 mmol/L   CO2 22 20 - 29 mmol/L   Calcium 9.4 8.7 - 10.2 mg/dL   Total Protein 7.2 6.0 - 8.5 g/dL   Albumin 4.7 3.8 - 4.8 g/dL   Globulin, Total 2.5 1.5 - 4.5 g/dL   Albumin/Globulin Ratio 1.9 1.2 - 2.2   Bilirubin Total 0.6 0.0 - 1.2 mg/dL   Alkaline Phosphatase 111 44 - 121 IU/L   AST 12 0 - 40 IU/L   ALT 13 0 - 32 IU/L  CBC  Result Value Ref Range   WBC 8.0 3.4 - 10.8 x10E3/uL   RBC 4.57 3.77 - 5.28 x10E6/uL   Hemoglobin 14.1 11.1 - 15.9 g/dL   Hematocrit 40.0 34.0 - 46.6 %   MCV 88 79 - 97 fL   MCH 30.9 26.6 - 33.0 pg   MCHC 35.3 31.5 - 35.7 g/dL   RDW 12.7 11.7 - 15.4 %   Platelets 262 150 - 450 x10E3/uL  Lipid panel  Result Value Ref Range   Cholesterol, Total 176 100 - 199 mg/dL   Triglycerides 53 0 - 149 mg/dL   HDL 64 >39 mg/dL   VLDL Cholesterol Cal 10 5 - 40 mg/dL   LDL Chol Calc (NIH) 102 (H) 0 - 99 mg/dL   Chol/HDL Ratio 2.8 0.0 - 4.4 ratio  TSH  Result Value Ref Range   TSH 2.120 0.450 - 4.500 uIU/mL          Assessment & Plan:  Abnormal serum creatinine level  Reviewed labs with patient.  Reassured patient that serum creatinine level is minimally elevated with a normal GFR.  No further work-up at this time.  Explained that the creatinine may have been off a bit due to slight dehydration with fasting labs.  Her concerns were  addressed. Return for Follow up as needed.

## 2021-12-13 ENCOUNTER — Other Ambulatory Visit: Payer: Self-pay | Admitting: Advanced Practice Midwife

## 2021-12-13 MED ORDER — MEDROXYPROGESTERONE ACETATE 150 MG/ML IM SUSY
150.0000 mg | PREFILLED_SYRINGE | INTRAMUSCULAR | 3 refills | Status: DC
  Filled 2021-12-13 – 2022-03-07 (×2): qty 1, 90d supply, fill #0
  Filled 2022-05-31: qty 1, 90d supply, fill #1
  Filled 2022-08-26: qty 1, 90d supply, fill #2
  Filled 2022-11-15: qty 1, 90d supply, fill #3

## 2021-12-14 ENCOUNTER — Other Ambulatory Visit (HOSPITAL_COMMUNITY): Payer: Self-pay

## 2021-12-17 ENCOUNTER — Ambulatory Visit: Payer: No Typology Code available for payment source

## 2021-12-17 ENCOUNTER — Other Ambulatory Visit (HOSPITAL_COMMUNITY): Payer: Self-pay

## 2021-12-18 ENCOUNTER — Ambulatory Visit (INDEPENDENT_AMBULATORY_CARE_PROVIDER_SITE_OTHER): Payer: No Typology Code available for payment source | Admitting: *Deleted

## 2021-12-18 DIAGNOSIS — Z3042 Encounter for surveillance of injectable contraceptive: Secondary | ICD-10-CM | POA: Diagnosis not present

## 2021-12-18 MED ORDER — MEDROXYPROGESTERONE ACETATE 150 MG/ML IM SUSP
150.0000 mg | Freq: Once | INTRAMUSCULAR | Status: AC
  Administered 2021-12-18: 150 mg via INTRAMUSCULAR

## 2021-12-18 NOTE — Progress Notes (Signed)
   NURSE VISIT- INJECTION  SUBJECTIVE:  Brianna Griffin is a 36 y.o. [phone] female here for a Depo Provera for contraception/period management. She is a GYN patient.   OBJECTIVE:  There were no vitals taken for this visit.  Appears well, in no apparent distress  Injection administered in: Left deltoid  Meds ordered this encounter  Medications   medroxyPROGESTERone (DEPO-PROVERA) injection 150 mg    ASSESSMENT: GYN patient Depo Provera for contraception/period management PLAN: Follow-up: in 11-13 weeks for next Depo   Levy Pupa  12/18/2021 3:20 PM

## 2022-01-15 ENCOUNTER — Other Ambulatory Visit (HOSPITAL_COMMUNITY): Payer: Self-pay

## 2022-01-17 ENCOUNTER — Other Ambulatory Visit (HOSPITAL_COMMUNITY): Payer: Self-pay

## 2022-01-18 ENCOUNTER — Other Ambulatory Visit (HOSPITAL_COMMUNITY): Payer: Self-pay

## 2022-01-28 ENCOUNTER — Ambulatory Visit: Payer: No Typology Code available for payment source | Admitting: Adult Health

## 2022-03-05 ENCOUNTER — Ambulatory Visit: Payer: No Typology Code available for payment source

## 2022-03-07 ENCOUNTER — Other Ambulatory Visit (HOSPITAL_COMMUNITY): Payer: Self-pay

## 2022-03-11 ENCOUNTER — Ambulatory Visit: Payer: No Typology Code available for payment source

## 2022-03-14 ENCOUNTER — Ambulatory Visit (INDEPENDENT_AMBULATORY_CARE_PROVIDER_SITE_OTHER): Payer: No Typology Code available for payment source | Admitting: *Deleted

## 2022-03-14 DIAGNOSIS — Z3042 Encounter for surveillance of injectable contraceptive: Secondary | ICD-10-CM

## 2022-03-14 MED ORDER — MEDROXYPROGESTERONE ACETATE 150 MG/ML IM SUSP
150.0000 mg | Freq: Once | INTRAMUSCULAR | Status: AC
  Administered 2022-03-14: 150 mg via INTRAMUSCULAR

## 2022-03-14 NOTE — Progress Notes (Signed)
   NURSE VISIT- INJECTION  SUBJECTIVE:  Brianna Griffin is a 36 y.o. [phone] female here for a Depo Provera for contraception/period management. She is a GYN patient.   OBJECTIVE:  There were no vitals taken for this visit.  Appears well, in no apparent distress  Injection administered in: Left deltoid  Meds ordered this encounter  Medications   medroxyPROGESTERone (DEPO-PROVERA) injection 150 mg    ASSESSMENT: GYN patient Depo Provera for contraception/period management PLAN: Follow-up: in 11-13 weeks for next Depo   [name]  03/14/2022 3:04 PM

## 2022-04-01 ENCOUNTER — Telehealth: Payer: No Typology Code available for payment source | Admitting: Family Medicine

## 2022-04-01 DIAGNOSIS — B9789 Other viral agents as the cause of diseases classified elsewhere: Secondary | ICD-10-CM | POA: Diagnosis not present

## 2022-04-01 DIAGNOSIS — J019 Acute sinusitis, unspecified: Secondary | ICD-10-CM | POA: Diagnosis not present

## 2022-04-01 MED ORDER — FLUTICASONE PROPIONATE 50 MCG/ACT NA SUSP: 2.0000 | Freq: Every day | NASAL | 0 refills | Status: DC

## 2022-04-01 NOTE — Progress Notes (Signed)
E-Visit for Sinus Problems  We are sorry that you are not feeling well.  Here is how we plan to help!  Based on what you have shared with me it looks like you have sinusitis.  Sinusitis is inflammation and infection in the sinus cavities of the head.  Based on your presentation I believe you most likely have Acute Viral Sinusitis.This is an infection most likely caused by a virus. There is not specific treatment for viral sinusitis other than to help you with the symptoms until the infection runs its course.  You may use an oral decongestant such as Mucinex D or if you have glaucoma or high blood pressure use plain Mucinex. Saline nasal spray help and can safely be used as often as needed for congestion, I have prescribed: Fluticasone nasal spray two sprays in each nostril once a day  Some authorities believe that zinc sprays or the use of Echinacea may shorten the course of your symptoms.  Sinus infections are not as easily transmitted as other respiratory infection, however we still recommend that you avoid close contact with loved ones, especially the very young and elderly.  Remember to wash your hands thoroughly throughout the day as this is the number one way to prevent the spread of infection!  Home Care: Only take medications as instructed by your medical team. Do not take these medications with alcohol. A steam or ultrasonic humidifier can help congestion.  You can place a towel over your head and breathe in the steam from hot water coming from a faucet. Avoid close contacts especially the very young and the elderly. Cover your mouth when you cough or sneeze. Always remember to wash your hands.  Get Help Right Away If: You develop worsening fever or sinus pain. You develop a severe head ache or visual changes. Your symptoms persist after you have completed your treatment plan.  Make sure you Understand these instructions. Will watch your condition. Will get help right away if you  are not doing well or get worse.   Thank you for choosing an e-visit.  Your e-visit answers were reviewed by a board certified advanced clinical practitioner to complete your personal care plan. Depending upon the condition, your plan could have included both over the counter or prescription medications.  Please review your pharmacy choice. Make sure the pharmacy is open so you can pick up prescription now. If there is a problem, you may contact your provider through [employer] and have the prescription routed to another pharmacy.  Your safety is important to [place]. If you have drug allergies check your prescription carefully.   For the next 24 hours you can use MyChart to ask questions about today's visit, request a non-urgent call back, or ask for a work or school excuse. You will get an email in the next two days asking about your experience. I hope that your e-visit has been valuable and will speed your recovery.  I provided 5 minutes of non face-to-face time during this encounter for chart review, medication and order placement, as well as and documentation.

## 2022-04-17 ENCOUNTER — Telehealth: Payer: No Typology Code available for payment source | Admitting: Emergency Medicine

## 2022-04-17 DIAGNOSIS — J40 Bronchitis, not specified as acute or chronic: Secondary | ICD-10-CM

## 2022-04-17 MED ORDER — PREDNISONE 10 MG (21) PO TBPK: ORAL_TABLET | ORAL | 0 refills | Status: DC

## 2022-04-17 NOTE — Progress Notes (Signed)
We are sorry that you are not feeling well.  Here is how we plan to help!  Based on your presentation I believe you most likely have A cough due to a virus.  This is called viral bronchitis and is best treated by rest, plenty of fluids and control of the cough.  You may use Ibuprofen or Tylenol as directed to help your symptoms.     In addition you may use A non-prescription cough medication called Mucinex DM: take 2 tablets every 12 hours.  Continue using your ventolin inhaler 2 puff every 4 to 6 hours if needed. I have also prescribed prednisone for you.   Since you get this every fall, I suggest you talk with your primary care provider, Dr. Lacinda Axon, about adding an asthma prevention medicine such as Flovent in the fall. This may or may not be a good choice for you  but something worth discussing since you know this happens every fall.   From your responses in the eVisit questionnaire you describe inflammation in the upper respiratory tract which is causing a significant cough.  This is commonly called Bronchitis and has four common causes:   Allergies Viral Infections Acid Reflux Bacterial Infection Allergies, viruses and acid reflux are treated by controlling symptoms or eliminating the cause. An example might be a cough caused by taking certain blood pressure medications. You stop the cough by changing the medication. Another example might be a cough caused by acid reflux. Controlling the reflux helps control the cough.  USE OF BRONCHODILATOR ("RESCUE") INHALERS: There is a risk from using your bronchodilator too frequently.  The risk is that over-reliance on a medication which only relaxes the muscles surrounding the breathing tubes can reduce the effectiveness of medications prescribed to reduce swelling and congestion of the tubes themselves.  Although you feel brief relief from the bronchodilator inhaler, your asthma may actually be worsening with the tubes becoming more swollen and filled  with mucus.  This can delay other crucial treatments, such as oral steroid medications. If you need to use a bronchodilator inhaler daily, several times per day, you should discuss this with your provider.  There are probably better treatments that could be used to keep your asthma under control.     HOME CARE Only take medications as instructed by your medical team. Complete the entire course of an antibiotic. Drink plenty of fluids and get plenty of rest. Avoid close contacts especially the very young and the elderly Cover your mouth if you cough or cough into your sleeve. Always remember to wash your hands A steam or ultrasonic humidifier can help congestion.   GET HELP RIGHT AWAY IF: You develop worsening fever. You become short of breath You cough up blood. Your symptoms persist after you have completed your treatment plan MAKE SURE YOU  Understand these instructions. Will watch your condition. Will get help right away if you are not doing well or get worse.    Thank you for choosing an e-visit.  Your e-visit answers were reviewed by a board certified advanced clinical practitioner to complete your personal care plan. Depending upon the condition, your plan could have included both over the counter or prescription medications.  Please review your pharmacy choice. Make sure the pharmacy is open so you can pick up prescription now. If there is a problem, you may contact your provider through [employer] and have the prescription routed to another pharmacy.  Your safety is important to [place]. If you have  drug allergies check your prescription carefully.   For the next 24 hours you can use MyChart to ask questions about today's visit, request a non-urgent call back, or ask for a work or school excuse. You will get an email in the next two days asking about your experience. I hope that your e-visit has been valuable and will speed your recovery.  I have spent 5 minutes in review of  e-visit questionnaire, review and updating patient chart, medical decision making and response to patient.   Willeen Cass, PhD, FNP-BC

## 2022-04-18 ENCOUNTER — Other Ambulatory Visit: Payer: Self-pay

## 2022-04-18 ENCOUNTER — Other Ambulatory Visit (HOSPITAL_COMMUNITY): Payer: Self-pay

## 2022-04-19 ENCOUNTER — Other Ambulatory Visit (HOSPITAL_COMMUNITY): Payer: Self-pay

## 2022-04-22 ENCOUNTER — Ambulatory Visit: Payer: No Typology Code available for payment source | Admitting: Family Medicine

## 2022-04-22 ENCOUNTER — Other Ambulatory Visit (HOSPITAL_COMMUNITY): Payer: Self-pay

## 2022-04-26 ENCOUNTER — Telehealth: Payer: No Typology Code available for payment source | Admitting: Nurse Practitioner

## 2022-04-29 ENCOUNTER — Telehealth (INDEPENDENT_AMBULATORY_CARE_PROVIDER_SITE_OTHER): Payer: No Typology Code available for payment source | Admitting: Family Medicine

## 2022-04-29 DIAGNOSIS — R053 Chronic cough: Secondary | ICD-10-CM | POA: Diagnosis not present

## 2022-04-29 DIAGNOSIS — J4540 Moderate persistent asthma, uncomplicated: Secondary | ICD-10-CM | POA: Diagnosis not present

## 2022-04-29 MED ORDER — BUDESONIDE-FORMOTEROL FUMARATE 160-4.5 MCG/ACT IN AERO: 2.0000 | INHALATION_SPRAY | Freq: Two times a day (BID) | RESPIRATORY_TRACT | 3 refills | Status: DC

## 2022-04-29 MED ORDER — BENZONATATE 200 MG PO CAPS: 200.0000 mg | ORAL_CAPSULE | Freq: Three times a day (TID) | ORAL | 1 refills | Status: DC | PRN

## 2022-04-29 NOTE — Progress Notes (Signed)
Virtual Visit via Video Note  I connected with Brianna Griffin on 04/29/22 at 11:20 AM EST by a video enabled telemedicine application and verified that I am speaking with the correct person using two identifiers.  Location: Patient: Home Provider: Office   I discussed the limitations of evaluation and management by telemedicine and the availability of in person appointments. The patient expressed understanding and agreed to proceed.  History of Present Illness: 36 year old female with a history of asthma presents with chronic cough.  Patient reports that she has had a cough for the past 2 months.  She has had 2 ED visits.  Has had treatment with Flonase as well as corticosteroids without improvement.  She states that she is concerned that her asthma is resulting in the cough.  She would like to discuss this today and possible addition of controller medication for her asthma.  She has had some chest tightness and shortness of breath although this is improved.  She is primarily bothered by the cough which is productive.   Observations/Objective: Speaking in full sentences.  No apparent respiratory distress. Overall well-appearing on video.  Assessment and Plan: Chronic cough - given asthma, I am starting the patient on Symbicort.  Tessalon Perles for cough.  If continues to persist will need chest x-ray as well as referral to pulmonology for PFTs and further evaluation.  Follow Up Instructions:    I discussed the assessment and treatment plan with the patient. The patient was provided an opportunity to ask questions and all were answered. The patient agreed with the plan and demonstrated an understanding of the instructions.   The patient was advised to call back or seek an in-person evaluation if the symptoms worsen or if the condition fails to improve as anticipated.  I provided 10 minutes of non-face-to-face time during this encounter.   Coral Spikes, DO

## 2022-05-01 ENCOUNTER — Encounter: Payer: Self-pay | Admitting: Family Medicine

## 2022-05-07 ENCOUNTER — Encounter: Payer: Self-pay | Admitting: Family Medicine

## 2022-05-07 ENCOUNTER — Other Ambulatory Visit: Payer: Self-pay | Admitting: Family Medicine

## 2022-05-31 ENCOUNTER — Other Ambulatory Visit (HOSPITAL_COMMUNITY): Payer: Self-pay

## 2022-06-03 ENCOUNTER — Ambulatory Visit: Payer: No Typology Code available for payment source

## 2022-06-04 ENCOUNTER — Ambulatory Visit (INDEPENDENT_AMBULATORY_CARE_PROVIDER_SITE_OTHER): Payer: 59 | Admitting: *Deleted

## 2022-06-04 DIAGNOSIS — Z3042 Encounter for surveillance of injectable contraceptive: Secondary | ICD-10-CM | POA: Diagnosis not present

## 2022-06-04 MED ORDER — MEDROXYPROGESTERONE ACETATE 150 MG/ML IM SUSP
150.0000 mg | Freq: Once | INTRAMUSCULAR | Status: AC
  Administered 2022-06-04: 150 mg via INTRAMUSCULAR

## 2022-06-04 NOTE — Progress Notes (Signed)
   NURSE VISIT- INJECTION  SUBJECTIVE:  Brianna Griffin is a 37 y.o. [phone] female here for a Depo Provera for contraception/period management. She is a GYN patient.   OBJECTIVE:  There were no vitals taken for this visit.  Appears well, in no apparent distress  Injection administered in: Right deltoid  Meds ordered this encounter  Medications   medroxyPROGESTERone (DEPO-PROVERA) injection 150 mg    ASSESSMENT: GYN patient Depo Provera for contraception/period management PLAN: Follow-up: in 11-13 weeks for next Depo   [name]  06/04/2022 3:53 PM

## 2022-06-05 DIAGNOSIS — F411 Generalized anxiety disorder: Secondary | ICD-10-CM | POA: Diagnosis not present

## 2022-06-20 DIAGNOSIS — F411 Generalized anxiety disorder: Secondary | ICD-10-CM | POA: Diagnosis not present

## 2022-07-04 ENCOUNTER — Encounter: Payer: Self-pay | Admitting: Adult Health

## 2022-07-04 ENCOUNTER — Ambulatory Visit (INDEPENDENT_AMBULATORY_CARE_PROVIDER_SITE_OTHER): Payer: 59 | Admitting: Adult Health

## 2022-07-04 DIAGNOSIS — F331 Major depressive disorder, recurrent, moderate: Secondary | ICD-10-CM

## 2022-07-04 DIAGNOSIS — F411 Generalized anxiety disorder: Secondary | ICD-10-CM

## 2022-07-04 NOTE — Progress Notes (Signed)
Brianna Griffin [id number] 07-Aug-1985 37 y.o.  Subjective:   Patient ID:  Brianna Griffin is a 37 y.o. (DOB 1986-01-23) female.  Chief Complaint: No chief complaint on file.   HPI Brianna Griffin presents to the office today for follow-up of MDD and GAD.  Describes mood today as "ok". Pleasant. Tearful at times. Mood symptoms - reports depression, anxiety, and irritability. Reports some worry, rumination, and over thinking. Reports mood as "lower". Feels angry - "ragey" a few times a week - mostly related to son. Stating "I have been struggling over the past month". Continues to take Wellbutrin XL 358m daily and Zoloft 520mdaily. Feels like she needs a medication change, but would like to do Gene sight testing before adding or changing medications. Family doing well. Stable interest and motivation. Taking medications as prescribed.  Energy levels lower. Active, does not have a regular exercise routine. Enjoys some usual interests and activities. Married 8 years, second marriage. Lives with husband and 3 children - 13396 and 37 months. Spending time with family. Appetite adequate. Weight loss - 140 pounds Sleeps well most nights. Averages 7 hours - waking up 2 to 3 times a night - gets back to sleep. Focus and concentration difficulties. Completing tasks. Managing aspects of household. Works F/T for [employer] 5 years. Denies SI or HI.  Denies AH or VH. Denies self harm. Denies substance use.   Seeing therapist - ReAlphia Kava  GAD-7    FlDixieffice [facility] [place] Fa[place]is[place] from 06/29/2020 in [place] from 12/27/2019 in [facility] [facility]Fort [facility] GAD-7 Score 3 13 2 2 $ 0      PHQ2-9     FlSanteeisit from 09/04/2021 [facility] WoLenkervillet FaNoc[facility] Hospitaloutine Prenatal from [facility]/21/2020 [facility] Wo[facility] [place]st FaAlpineisit from 06/29/2020 in RePoint Bakernitial Prenatal from 06/08/2020 in [facility] WoPerrysburgt FaMount Crested [place]isit from 12/27/2019 in ReManitouPHQ-2 Total Score 1 1 0 0 0  PHQ-9 Total Score 2 4 2 4 2      $ Flowsheet Row Admission (Discharged) from 12/06/2020 in Co[facility]S Mother Baby Unit Admission (Discharged) from 11/11/2020 [facility]  C-SSRS RISK CATEGORY No Risk No Risk        Review of Systems:  Review of Systems  Respiratory:  Negative for shortness of breath.   Musculoskeletal:  Negative for gait problem.  Neurological:  Negative for tremors.  Psychiatric/Behavioral:         Please refer to HPI    Medications: I have reviewed the patient's current medications.  Current Outpatient Medications  Medication Sig Dispense Refill   acetaminophen (TYLENOL) 325 MG tablet Take 2 tablets (650 mg total) by mouth every 4 (four) hours as needed (for pain scale < 4). 30 tablet 0   albuterol (VENTOLIN HFA) 108 (90 Base) MCG/ACT inhaler Inhale 2 puffs into the lungs every 6 (six) hours as needed for wheezing or shortness of breath. 8 g 0   benzonatate (TESSALON) 200 MG capsule TAKE 1 CAPSULE BY MOUTH 3 TIMES A DAY AS NEEDED FOR COUGH. 30 capsule 0  budesonide-formoterol (SYMBICORT) 160-4.5 MCG/ACT inhaler Inhale 2 puffs into the lungs 2 (two) times daily. 1 each 3   buPROPion (WELLBUTRIN XL) 300 MG 24 hr tablet TAKE 1 TABLET BY MOUTH AT BEDTIME 90 tablet 3   Cetirizine HCl (ZYRTEC PO) Take by mouth.     fluticasone (FLONASE) 50 MCG/ACT nasal spray Place 2 sprays into both nostrils daily. 16 g 0   medroxyPROGESTERone Acetate 150 MG/ML SUSY Inject 1 mL (150 mg total) into the muscle every 3 (three) months. 1 mL 3    sertraline (ZOLOFT) 50 MG tablet Take 1 tablet (50 mg total) by mouth daily. 90 tablet 1   No current facility-administered medications for this visit.    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   Please see review of systems for further details on the patient's review from today.   Objective:   Physical Exam:  There were no vitals taken for this visit.  Physical Exam Constitutional:      General: She is not in acute distress. Musculoskeletal:        General: No deformity.  Neurological:     Mental Status: She is alert and oriented to person, place, and time.     Coordination: Coordination normal.  Psychiatric:        Attention and Perception: Attention and perception normal. She does not perceive auditory or visual hallucinations.        Mood and Affect: Mood normal. Mood is not anxious or depressed. Affect is not labile, blunt, angry or inappropriate.        Speech: Speech normal.        Behavior: Behavior normal.        Thought Content: Thought content normal. Thought content is not paranoid or delusional. Thought content does not include homicidal or suicidal ideation. Thought content does not include homicidal or suicidal plan.        Cognition and Memory: Cognition and memory normal.        Judgment: Judgment normal.     Comments:  Insight intact     Lab Review:     Component Value Date/Time   NA 139 10/12/2021 0810   K 4.4 10/12/2021 0810   CL 102 10/12/2021 0810   CO2 22 10/12/2021 0810   GLUCOSE 85 10/12/2021 0810   GLUCOSE 101 (H) 12/06/2020 0229   BUN 11 10/12/2021 0810   CREATININE 1.04 (H) 10/12/2021 0810   CALCIUM 9.4 10/12/2021 0810   PROT 7.2 10/12/2021 0810   ALBUMIN 4.7 10/12/2021 0810   AST 12 10/12/2021 0810   ALT 13 10/12/2021 0810   ALKPHOS 111 10/12/2021 0810   BILITOT 0.6 10/12/2021 0810   GFRNONAA >60 12/06/2020 0229   GFRAA 138 06/14/2020 1651       Component Value  Date/Time   WBC 8.0 10/12/2021 0810   WBC 13.2 (H) 12/06/2020 0229   RBC 4.57 10/12/2021 0810   RBC 3.95 12/06/2020 0229   HGB 14.1 10/12/2021 0810   HCT 40.0 10/12/2021 0810   PLT 262 10/12/2021 0810   MCV 88 10/12/2021 0810   MCH 30.9 10/12/2021 0810   MCH 31.4 12/06/2020 0229   MCHC 35.3 10/12/2021 0810   MCHC 35.0 12/06/2020 0229   RDW 12.7 10/12/2021 0810   LYMPHSABS 1.9 06/14/2020 1651   EOSABS 0.1 06/14/2020 1651   BASOSABS 0.0 06/14/2020 1651    No results found for: "POCLITH", "LITHIUM"   No results found for: "PHENYTOIN", "PHENOBARB", "VALPROATE", "CBMZ"   .res Assessment: Plan:    Plan:  PDMP reviewed  Wellbutrin XL 358m every morning - denies seizure history  Zoloft 518mdaily  Initiate Genesight testing before making further medication changes.   Screening tools: [facility] ADD test 39/58 - ADD possible Connors positive for ADD inattentive type  Time spent with patient was 25 minutes. Greater than 50% of face to face time with patient was spent on counseling and coordination of care.    RTC 8 weeks  Patient advised to contact office with any questions, adverse effects, or acute worsening in signs and symptoms.   Diagnoses and all orders for this visit:  Major depressive disorder, recurrent episode, moderate (HCC)  Generalized anxiety disorder  Attention deficit  hyperactivity disorder (ADHD), combined type     Please see After Visit Summary for patient specific instructions.  Future Appointments  Date Time Provider [place]2/16/2024  3:00 PM HoNilda SimmerNP [facility]4/01/2023  3:50 PM [facility] NURSE [facility] FTOBGYN    No orders of the defined types were placed in this encounter.   -------------------------------

## 2022-07-05 ENCOUNTER — Other Ambulatory Visit (HOSPITAL_COMMUNITY): Payer: Self-pay

## 2022-07-05 ENCOUNTER — Other Ambulatory Visit: Payer: Self-pay

## 2022-07-05 ENCOUNTER — Ambulatory Visit (INDEPENDENT_AMBULATORY_CARE_PROVIDER_SITE_OTHER): Payer: 59 | Admitting: Nurse Practitioner

## 2022-07-05 VITALS — BP 108/71 | HR 60 | Wt 140.0 lb

## 2022-07-05 DIAGNOSIS — M71372 Other bursal cyst, left ankle and foot: Secondary | ICD-10-CM

## 2022-07-05 DIAGNOSIS — K2100 Gastro-esophageal reflux disease with esophagitis, without bleeding: Secondary | ICD-10-CM

## 2022-07-05 DIAGNOSIS — F418 Other specified anxiety disorders: Secondary | ICD-10-CM

## 2022-07-05 DIAGNOSIS — K582 Mixed irritable bowel syndrome: Secondary | ICD-10-CM | POA: Diagnosis not present

## 2022-07-05 MED ORDER — PANTOPRAZOLE SODIUM 40 MG PO TBEC
40.0000 mg | DELAYED_RELEASE_TABLET | Freq: Every day | ORAL | 2 refills | Status: DC
  Filled 2022-07-05: qty 30, 30d supply, fill #0

## 2022-07-05 NOTE — Progress Notes (Unsigned)
   Subjective:    Patient ID: Brianna Griffin, female    DOB: 09-05-1985, 37 y.o.   MRN: [id number]  HPI  Patient arrives today to restart reflux medication. Patient states she has swollen area on left ankle. Patient denies pain or injury.     Review of Systems     Objective:   Physical Exam        Assessment & Plan:

## 2022-07-05 NOTE — Patient Instructions (Signed)
Food Choices for Gastroesophageal Reflux Disease, Adult When you have gastroesophageal reflux disease (GERD), the foods you eat and your eating habits are very important. Choosing the right foods can help ease the discomfort of GERD. Consider working with a dietitian to help you make healthy food choices. What are tips for following this plan? Reading food labels Look for foods that are low in saturated fat. Foods that have less than 5% of daily value (DV) of fat and 0 g of trans fats may help with your symptoms. Cooking Cook foods using methods other than frying. This may include baking, steaming, grilling, or broiling. These are all methods that do not need a lot of fat for cooking. To add flavor, try to use herbs that are low in spice and acidity. Meal planning  Choose healthy foods that are low in fat, such as fruits, vegetables, whole grains, low-fat dairy products, lean meats, fish, and poultry. Eat frequent, small meals instead of three large meals each day. Eat your meals slowly, in a relaxed setting. Avoid bending over or lying down until 2-3 hours after eating. Limit high-fat foods such as fatty meats or fried foods. Limit your intake of fatty foods, such as oils, butter, and shortening. Avoid the following as told by your health care provider: Foods that cause symptoms. These may be different for different people. Keep a food diary to keep track of foods that cause symptoms. Alcohol. Drinking large amounts of liquid with meals. Eating meals during the 2-3 hours before bed. Lifestyle Maintain a healthy weight. Ask your health care provider what weight is healthy for you. If you need to lose weight, work with your health care provider to do so safely. Exercise for at least 30 minutes on 5 or more days each week, or as told by your health care provider. Avoid wearing clothes that fit tightly around your waist and chest. Do not use any products that contain nicotine or tobacco. These  products include cigarettes, chewing tobacco, and vaping devices, such as e-cigarettes. If you need help quitting, ask your health care provider. Sleep with the head of your bed raised. Use a wedge under the mattress or blocks under the bed frame to raise the head of the bed. Chew sugar-free gum after mealtimes. What foods should I eat?  Eat a healthy, well-balanced diet of fruits, vegetables, whole grains, low-fat dairy products, lean meats, fish, and poultry. Each person is different. Foods that may trigger symptoms in one person may not trigger any symptoms in another person. Work with your health care provider to identify foods that are safe for you. The items listed above may not be a complete list of recommended foods and beverages. Contact a dietitian for more information. What foods should I avoid? Limiting some of these foods may help manage the symptoms of GERD. Everyone is different. Consult a dietitian or your health care provider to help you identify the exact foods to avoid, if any. Fruits Any fruits prepared with added fat. Any fruits that cause symptoms. For some people this may include citrus fruits, such as oranges, grapefruit, pineapple, and lemons. Vegetables Deep-fried vegetables. French fries. Any vegetables prepared with added fat. Any vegetables that cause symptoms. For some people, this may include tomatoes and tomato products, chili peppers, onions and garlic, and horseradish. Grains Pastries or quick breads with added fat. Meats and other proteins High-fat meats, such as fatty beef or pork, hot dogs, ribs, ham, sausage, salami, and bacon. Fried meat or protein, including   fried fish and fried chicken. Nuts and nut butters, in large amounts. Dairy Whole milk and chocolate milk. Sour cream. Cream. Ice cream. Cream cheese. Milkshakes. Fats and oils Butter. Margarine. Shortening. Ghee. Beverages Coffee and tea, with or without caffeine. Carbonated beverages. Sodas. Energy  drinks. Fruit juice made with acidic fruits, such as orange or grapefruit. Tomato juice. Alcoholic drinks. Sweets and desserts Chocolate and cocoa. Donuts. Seasonings and condiments Pepper. Peppermint and spearmint. Added salt. Any condiments, herbs, or seasonings that cause symptoms. For some people, this may include curry, hot sauce, or vinegar-based salad dressings. The items listed above may not be a complete list of foods and beverages to avoid. Contact a dietitian for more information. Questions to ask your health care provider Diet and lifestyle changes are usually the first steps that are taken to manage symptoms of GERD. If diet and lifestyle changes do not improve your symptoms, talk with your health care provider about taking medicines. Where to find more information International Foundation for Gastrointestinal Disorders: aboutgerd.org Summary When you have gastroesophageal reflux disease (GERD), food and lifestyle choices may be very helpful in easing the discomfort of GERD. Eat frequent, small meals instead of three large meals each day. Eat your meals slowly, in a relaxed setting. Avoid bending over or lying down until 2-3 hours after eating. Limit high-fat foods such as fatty meats or fried foods. This information is not intended to replace advice given to you by your health care provider. Make sure you discuss any questions you have with your health care provider. Document Revised: 11/15/2019 Document Reviewed: 11/15/2019 Elsevier Patient Education  2023 Elsevier Inc.  

## 2022-07-06 ENCOUNTER — Encounter: Payer: Self-pay | Admitting: Nurse Practitioner

## 2022-07-06 DIAGNOSIS — M71372 Other bursal cyst, left ankle and foot: Secondary | ICD-10-CM | POA: Insufficient documentation

## 2022-07-11 DIAGNOSIS — F411 Generalized anxiety disorder: Secondary | ICD-10-CM | POA: Diagnosis not present

## 2022-07-12 ENCOUNTER — Other Ambulatory Visit: Payer: Self-pay | Admitting: Nurse Practitioner

## 2022-07-12 ENCOUNTER — Other Ambulatory Visit (HOSPITAL_COMMUNITY): Payer: Self-pay

## 2022-07-12 ENCOUNTER — Encounter: Payer: Self-pay | Admitting: Nurse Practitioner

## 2022-07-12 ENCOUNTER — Other Ambulatory Visit: Payer: Self-pay

## 2022-07-12 MED ORDER — ONDANSETRON 4 MG PO TBDP
4.0000 mg | ORAL_TABLET | Freq: Three times a day (TID) | ORAL | 0 refills | Status: DC | PRN
  Filled 2022-07-12: qty 30, 10d supply, fill #0

## 2022-07-25 DIAGNOSIS — F411 Generalized anxiety disorder: Secondary | ICD-10-CM | POA: Diagnosis not present

## 2022-07-31 ENCOUNTER — Encounter: Payer: Self-pay | Admitting: Nurse Practitioner

## 2022-07-31 ENCOUNTER — Encounter: Payer: Self-pay | Admitting: Adult Health

## 2022-07-31 ENCOUNTER — Ambulatory Visit (INDEPENDENT_AMBULATORY_CARE_PROVIDER_SITE_OTHER): Payer: 59 | Admitting: Adult Health

## 2022-07-31 DIAGNOSIS — F411 Generalized anxiety disorder: Secondary | ICD-10-CM | POA: Diagnosis not present

## 2022-07-31 DIAGNOSIS — F331 Major depressive disorder, recurrent, moderate: Secondary | ICD-10-CM

## 2022-07-31 MED ORDER — BUPROPION HCL ER (XL) 300 MG PO TB24
300.0000 mg | ORAL_TABLET | Freq: Every day | ORAL | 3 refills | Status: DC
  Filled 2022-07-31: qty 90, 90d supply, fill #0
  Filled 2022-12-30: qty 90, 90d supply, fill #1
  Filled 2023-05-05: qty 90, 90d supply, fill #2

## 2022-07-31 MED ORDER — SERTRALINE HCL 50 MG PO TABS
50.0000 mg | ORAL_TABLET | Freq: Every day | ORAL | 3 refills | Status: DC
  Filled 2022-07-31: qty 90, 90d supply, fill #0

## 2022-07-31 MED ORDER — HYDROXYZINE HCL 25 MG PO TABS
25.0000 mg | ORAL_TABLET | Freq: Four times a day (QID) | ORAL | 1 refills | Status: DC | PRN
  Filled 2022-07-31: qty 360, 90d supply, fill #0

## 2022-07-31 NOTE — Progress Notes (Signed)
Tanina Rakoczy HR:[id number] July 08, 1985 37 y.o.  Subjective:   Patient ID:  Brianna Griffin is a 37 y.o. (DOB Feb 19, 1986) female.  Chief Complaint: No chief complaint on file.   HPI Brianna Griffin presents to the office today for follow-up of MDD and GAD.  Describes mood today as "ok". Pleasant. Tearful at times. Mood symptoms - reports depression, anxiety, and irritability. Reports more anxiety overall. Reports some worry, rumination, and over thinking. Reports mood as low. Reports decreased anger and rage - "not as bad as it was last month". Stating "I still feel like I'm struggling". Currently taking Wellbutrin XL '300mg'$  daily and Zoloft '50mg'$  daily, but would like to consider other options. Family doing well. Stable interest and motivation. Taking medications as prescribed.  Energy levels lower. Active, does not have a regular exercise routine. Enjoys some usual interests and activities. Married 8 years, second marriage. Lives with husband and 3 children - 28, 6 and 19 months. Spending time with family. Appetite adequate. Weight loss - 140 pounds Sleeps well most nights. Averages 7 hours - waking up during the night and getting back to sleep. Focus and concentration difficulties. Completing tasks. Managing aspects of household. Works F/T for [employer] x 5 years. Denies SI or HI.  Denies AH or VH. Denies self harm. Denies substance use.   Seeing therapist - Alphia Kava   GAD-7    [place] Office Visit from 07/05/2022 in [place] Office Visit from 09/04/2021 in [facility] for [place] at [facility] Routine Prenatal from 09/21/2020 in [facility] for [place] at [place] Visit from 06/29/2020 in [place] Visit from 12/27/2019 in [place]  Total GAD-7 Score '16 3 13 2 2      '$ PHQ2-9    [place] Office Visit from 07/05/2022 in [place] Visit from 09/04/2021 in [facility] for [place] at [facility] Routine Prenatal from 09/21/2020 in [facility] for [place] at [place] Visit from 06/29/2020 in [place] Initial Prenatal from 06/08/2020 in [facility] for [place] at [facility]  PHQ-2 Total Score '3 1 1 '$ 0 0  PHQ-9 Total Score '14 2 4 2 4      '$ Flowsheet Row Admission (Discharged) from 12/06/2020 in [place] 4S Mother Baby Unit Admission (Discharged) from 11/11/2020 in [place] [facility]  C-SSRS RISK CATEGORY No Risk No Risk        Review of Systems:  Review of Systems  Musculoskeletal:  Negative for gait problem.  Neurological:  Negative for tremors.  Psychiatric/Behavioral:         Please refer to HPI    Medications: I have reviewed the patient's current medications.  Current Outpatient Medications  Medication Sig Dispense Refill   hydrOXYzine (ATARAX) 25 MG tablet Take 1 tablet (25 mg total) by mouth every 6 (six) hours as needed. 360 tablet 1   ondansetron (ZOFRAN-ODT) 4 MG disintegrating tablet Dissolve 1 tablet (4 mg total) by mouth every 8 (eight) hours as needed for nausea. 30 tablet 0   acetaminophen (TYLENOL) 325 MG tablet Take 2 tablets (650 mg total) by mouth every 4 (four) hours as needed (for pain scale < 4). 30 tablet 0   albuterol (VENTOLIN HFA) 108 (90 Base) MCG/ACT inhaler Inhale 2 puffs into the lungs every 6 (six) hours as needed for wheezing or shortness of breath. 8 g 0  budesonide-formoterol (SYMBICORT) 160-4.5 MCG/ACT inhaler Inhale 2 puffs into the lungs 2 (two) times daily. 1 each 3   buPROPion (WELLBUTRIN XL) 300 MG 24 hr tablet TAKE 1 TABLET BY MOUTH AT BEDTIME 90 tablet 3   medroxyPROGESTERone Acetate 150 MG/ML SUSY Inject 1 mL (150 mg total) into the muscle every 3 (three) months. 1 mL 3   pantoprazole (PROTONIX) 40 MG tablet Take 1 tablet (40 mg total) by mouth daily. 30 tablet 2    sertraline (ZOLOFT) 50 MG tablet Take 1 tablet (50 mg total) by mouth daily. 90 tablet 3   No current facility-administered medications for this visit.    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   Please see review of systems for further details on the patient's review from today.   Objective:   Physical Exam:  There were no vitals taken for this visit.  Physical Exam Constitutional:      General: She is not in acute distress. Musculoskeletal:        General: No deformity.  Neurological:     Mental Status: She is alert and oriented to person, place, and time.     Coordination: Coordination normal.  Psychiatric:        Attention and Perception: Attention and perception normal. She does not perceive auditory or visual hallucinations.        Mood and Affect: Mood normal. Mood is not anxious or depressed. Affect is not labile, blunt, angry or inappropriate.        Speech: Speech normal.        Behavior: Behavior normal.        Thought Content: Thought content normal. Thought content is not paranoid or delusional. Thought content does not include homicidal or suicidal ideation. Thought content does not include homicidal or suicidal plan.        Cognition and Memory: Cognition and memory normal.        Judgment: Judgment normal.     Comments:  Insight intact     Lab Review:     Component Value Date/Time   NA 139 10/12/2021 0810   K 4.4 10/12/2021 0810   CL 102 10/12/2021 0810   CO2 22 10/12/2021 0810   GLUCOSE 85 10/12/2021 0810   GLUCOSE 101 (H) 12/06/2020 0229   BUN 11 10/12/2021 0810   CREATININE 1.04 (H) 10/12/2021 0810   CALCIUM 9.4 10/12/2021 0810   PROT 7.2 10/12/2021 0810   ALBUMIN 4.7 10/12/2021 0810   AST 12 10/12/2021 0810   ALT 13 10/12/2021 0810   ALKPHOS 111 10/12/2021 0810   BILITOT 0.6 10/12/2021 0810   GFRNONAA >60 12/06/2020 0229   GFRAA 138 06/14/2020 1651       Component Value Date/Time   WBC 8.0 10/12/2021 0810   WBC  13.2 (H) 12/06/2020 0229   RBC 4.57 10/12/2021 0810   RBC 3.95 12/06/2020 0229   HGB 14.1 10/12/2021 0810   HCT 40.0 10/12/2021 0810   PLT 262 10/12/2021 0810   MCV 88 10/12/2021 0810   MCH 30.9 10/12/2021 0810   MCH 31.4 12/06/2020 0229   MCHC 35.3 10/12/2021 0810   MCHC 35.0 12/06/2020 0229   RDW 12.7 10/12/2021 0810   LYMPHSABS 1.9 06/14/2020 1651   EOSABS 0.1 06/14/2020 1651   BASOSABS 0.0 06/14/2020 1651    No results found for: "POCLITH", "LITHIUM"   No results found for: "PHENYTOIN", "PHENOBARB", "VALPROATE", "CBMZ"   .res Assessment: Plan:    Plan:  PDMP reviewed  Wellbutrin XL '300mg'$  every morning - denies seizure history  Increase Zoloft '50mg'$  to '100mg'$  daily  Add Hydroxyzine '25mg'$  up to 4 x times.  [facility] testing reviewed  Screening tools: [facility] ADD test 39/58 - ADD possible Connors positive for ADD inattentive type  Time spent with patient was 25 minutes. Greater than 50% of face to face time with patient was spent on counseling and coordination of care.    RTC 8 weeks  Patient advised to contact office with any questions, adverse effects, or acute worsening in signs and symptoms.   Diagnoses and all orders for this visit:  Major depressive disorder, recurrent episode, moderate (HCC) -     sertraline (ZOLOFT) 50 MG tablet; Take  1 tablet (50 mg total) by mouth daily. -     buPROPion (WELLBUTRIN XL) 300 MG 24 hr tablet; TAKE 1 TABLET BY MOUTH AT BEDTIME  Generalized anxiety disorder -     sertraline (ZOLOFT) 50 MG tablet; Take 1 tablet (50 mg total) by mouth daily. -     hydrOXYzine (ATARAX) 25 MG tablet; Take 1 tablet (25 mg total) by mouth every 6 (six) hours as needed.     Please see After Visit Summary for patient specific instructions.  Future Appointments  Date Time Provider [place]  08/27/2022  3:50 PM [facility] NURSE [facility]    No orders of the defined types were placed in this encounter.   -------------------------------

## 2022-08-01 ENCOUNTER — Other Ambulatory Visit: Payer: Self-pay

## 2022-08-01 ENCOUNTER — Other Ambulatory Visit (HOSPITAL_COMMUNITY): Payer: Self-pay

## 2022-08-01 ENCOUNTER — Telehealth: Payer: Self-pay | Admitting: Adult Health

## 2022-08-01 ENCOUNTER — Ambulatory Visit: Payer: 59 | Admitting: Adult Health

## 2022-08-01 NOTE — Telephone Encounter (Signed)
Received FMLA paperwork. Given to [facility] 3/14

## 2022-08-02 ENCOUNTER — Other Ambulatory Visit: Payer: Self-pay | Admitting: Nurse Practitioner

## 2022-08-02 MED ORDER — LINACLOTIDE 145 MCG PO CAPS
145.0000 ug | ORAL_CAPSULE | Freq: Every day | ORAL | 2 refills | Status: DC
  Filled 2022-08-02: qty 30, 30d supply, fill #0

## 2022-08-03 ENCOUNTER — Other Ambulatory Visit (HOSPITAL_COMMUNITY): Payer: Self-pay

## 2022-08-05 ENCOUNTER — Telehealth: Payer: Self-pay | Admitting: Adult Health

## 2022-08-05 ENCOUNTER — Other Ambulatory Visit (HOSPITAL_COMMUNITY): Payer: Self-pay

## 2022-08-05 NOTE — Telephone Encounter (Signed)
Received fax on Brianna Griffin for completion of FMLA form. Placed in Traci's box to complete.

## 2022-08-09 ENCOUNTER — Encounter: Payer: Self-pay | Admitting: Obstetrics & Gynecology

## 2022-08-12 ENCOUNTER — Telehealth: Payer: Self-pay | Admitting: Adult Health

## 2022-08-12 NOTE — Telephone Encounter (Signed)
Received fax from [employer] for completion of FMLA form. Placed in Traci's box.

## 2022-08-15 DIAGNOSIS — F411 Generalized anxiety disorder: Secondary | ICD-10-CM | POA: Diagnosis not present

## 2022-08-19 ENCOUNTER — Telehealth: Payer: 59 | Admitting: Family Medicine

## 2022-08-19 DIAGNOSIS — H9201 Otalgia, right ear: Secondary | ICD-10-CM | POA: Diagnosis not present

## 2022-08-19 MED ORDER — AMOXICILLIN-POT CLAVULANATE 875-125 MG PO TABS: 1.0000 | ORAL_TABLET | Freq: Two times a day (BID) | ORAL | 0 refills | Status: AC

## 2022-08-19 NOTE — Progress Notes (Signed)
E-Visit for Ear Pain - Acute Otitis Media   We are sorry that you are not feeling well. Here is how we plan to help!  Based on what you have shared with me it looks like you have Acute Otitis Media.  Acute Otitis Media is an infection of the middle or "inner" ear. This type of infection can cause redness, inflammation, and fluid buildup behind the tympanic membrane (ear drum).  The usual symptoms include: Earache/Pain Fever Upper respiratory symptoms Lack of energy/Fatigue/Malaise Slight hearing loss gradually worsening- if the inner ear fills with fluid What causes middle ear infections? Most middle ear infections occur when an infection such as a cold, leads to a build-up of mucus in the middle ear and causes the Eustachian tube (a thin tube that runs from the middle ear to the back of the nose) to become swollen or blocked.   This means mucus can't drain away properly, making it easier for an infection to spread into the middle ear.  How middle ear infections are treated: Most ear infections clear up within three to five days and don't need any specific treatment. If necessary, tylenol or ibuprofen should be used to relieve pain and a high temperature.  If you develop a fever higher than 102, or any significantly worsening symptoms, this could indicate a more serious infection moving to the middle/inner and needs face to face evaluation in an office by a provider.   Antibiotics aren't routinely used to treat middle ear infections, although they may occasionally be prescribed if symptoms persist or are particularly severe. Given your presentation,   I have prescribed Augmentin twice daily for the next 7 days.   Your symptoms should improve over the next 3 days and should resolve in about 7 days. Be sure to complete ALL of the prescription(s) given.  HOME CARE: Wash your hands frequently. If you are prescribed an ear drop, do not place the tip of the bottle on your ear or touch it with  your fingers. You can take Acetaminophen 650 mg every 4-6 hours as needed for pain.  If pain is severe or moderate, you can apply a heating pad (set on low) or hot water bottle (wrapped in a towel) to outer ear for 20 minutes.  This will also increase drainage.  GET HELP RIGHT AWAY IF: Fever is over 102.2 degrees. You develop progressive ear pain or hearing loss. Ear symptoms persist longer than 3 days after treatment.  MAKE SURE YOU: Understand these instructions. Will watch your condition. Will get help right away if you are not doing well or get worse.  Thank you for choosing an e-visit.  Your e-visit answers were reviewed by a board certified advanced clinical practitioner to complete your personal care plan. Depending upon the condition, your plan could have included both over the counter or prescription medications.  Please review your pharmacy choice. Make sure the pharmacy is open so you can pick up the prescription now. If there is a problem, you may contact your provider through [employer] and have the prescription routed to another pharmacy.  Your safety is important to [place]. If you have drug allergies check your prescription carefully.   For the next 24 hours you can use MyChart to ask questions about today's visit, request a non-urgent call back, or ask for a work or school excuse. You will get an email with a survey after your eVisit asking about your experience. We would appreciate your feedback. I hope that your  e-visit has been valuable and will aid in your recovery.    I provided 5 minutes of non face-to-face time during this encounter for chart review, medication and order placement, as well as and documentation.

## 2022-08-21 ENCOUNTER — Telehealth: Payer: 59 | Admitting: Physician Assistant

## 2022-08-21 DIAGNOSIS — H9209 Otalgia, unspecified ear: Secondary | ICD-10-CM

## 2022-08-21 DIAGNOSIS — B9689 Other specified bacterial agents as the cause of diseases classified elsewhere: Secondary | ICD-10-CM

## 2022-08-21 MED ORDER — AZITHROMYCIN 250 MG PO TABS: ORAL_TABLET | ORAL | 0 refills | Status: AC

## 2022-08-21 NOTE — Progress Notes (Signed)

## 2022-08-23 DIAGNOSIS — Z0289 Encounter for other administrative examinations: Secondary | ICD-10-CM

## 2022-08-26 ENCOUNTER — Other Ambulatory Visit (HOSPITAL_COMMUNITY): Payer: Self-pay

## 2022-08-26 ENCOUNTER — Ambulatory Visit: Payer: 59 | Admitting: Adult Health

## 2022-08-26 ENCOUNTER — Other Ambulatory Visit: Payer: Self-pay

## 2022-08-27 ENCOUNTER — Other Ambulatory Visit (HOSPITAL_COMMUNITY): Payer: Self-pay

## 2022-08-27 ENCOUNTER — Ambulatory Visit: Payer: 59

## 2022-08-27 ENCOUNTER — Other Ambulatory Visit: Payer: Self-pay

## 2022-08-27 NOTE — Telephone Encounter (Signed)
Noted this was completed and faxed

## 2022-08-30 DIAGNOSIS — F411 Generalized anxiety disorder: Secondary | ICD-10-CM | POA: Diagnosis not present

## 2022-09-02 ENCOUNTER — Ambulatory Visit (INDEPENDENT_AMBULATORY_CARE_PROVIDER_SITE_OTHER): Payer: 59 | Admitting: *Deleted

## 2022-09-02 DIAGNOSIS — Z3042 Encounter for surveillance of injectable contraceptive: Secondary | ICD-10-CM

## 2022-09-02 MED ORDER — MEDROXYPROGESTERONE ACETATE 150 MG/ML IM SUSY
150.0000 mg | PREFILLED_SYRINGE | Freq: Once | INTRAMUSCULAR | Status: AC
  Administered 2022-09-02: 150 mg via INTRAMUSCULAR

## 2022-09-02 NOTE — Progress Notes (Signed)
   NURSE VISIT- INJECTION  SUBJECTIVE:  Brianna Griffin is a 37 y.o. [id number] female here for a Depo Provera for contraception/period management. She is a GYN patient.   OBJECTIVE:  There were no vitals taken for this visit.  Appears well, in no apparent distress  Injection administered in: Left deltoid  Meds ordered this encounter  Medications   medroxyPROGESTERone Acetate SUSY 150 mg    ASSESSMENT: GYN patient Depo Provera for contraception/period management PLAN: Follow-up: in 11-13 weeks for next Depo   Malachy Mood  09/02/2022 2:48 PM

## 2022-09-11 DIAGNOSIS — F411 Generalized anxiety disorder: Secondary | ICD-10-CM | POA: Diagnosis not present

## 2022-09-12 ENCOUNTER — Ambulatory Visit (INDEPENDENT_AMBULATORY_CARE_PROVIDER_SITE_OTHER): Payer: 59 | Admitting: Adult Health

## 2022-09-12 ENCOUNTER — Other Ambulatory Visit: Payer: Self-pay

## 2022-09-12 ENCOUNTER — Other Ambulatory Visit (HOSPITAL_COMMUNITY): Payer: Self-pay

## 2022-09-12 ENCOUNTER — Encounter: Payer: Self-pay | Admitting: Adult Health

## 2022-09-12 DIAGNOSIS — F331 Major depressive disorder, recurrent, moderate: Secondary | ICD-10-CM | POA: Diagnosis not present

## 2022-09-12 DIAGNOSIS — F411 Generalized anxiety disorder: Secondary | ICD-10-CM | POA: Diagnosis not present

## 2022-09-12 MED ORDER — SERTRALINE HCL 100 MG PO TABS
100.0000 mg | ORAL_TABLET | Freq: Every day | ORAL | 3 refills | Status: DC
  Filled 2022-09-12: qty 90, 90d supply, fill #0
  Filled 2023-03-18: qty 90, 90d supply, fill #1
  Filled 2023-08-12 – 2023-08-14 (×2): qty 90, 90d supply, fill #2

## 2022-09-12 NOTE — Progress Notes (Signed)
Brianna Griffin [id number] 04-03-1986 37 y.o.  Subjective:   Patient ID:  Brianna Griffin is a 37 y.o. (DOB [birth year]) female.  Chief Complaint: No chief complaint on file.   HPI Briany Aye presents to the office today for follow-up of MDD and GAD.  Describes mood today as "better". Pleasant. Denies tearful. Mood symptoms - reports decreased depression, anxiety, and irritability. Reports some worry, rumination, and over thinking. Reports mood has improved. Reports decreased anger and rage - "not so much, still struggles sometimes". Stating "I feel more evened out". Currently taking Wellbutrin XL  daily and Zoloft  daily, but would like to consider other options. Family doing well. Stable interest and motivation. Taking medications as prescribed.  Energy levels lower. Active, does not have a regular exercise routine. Enjoys some usual interests and activities. Married 8 years, second marriage. Lives with husband and 3 children - 53, 6 and 19 months. Spending time with family. Appetite adequate. Weight loss - 140 pounds Sleeps well most nights. Averages 7 hours - waking up during the night and getting back to sleep. Focus and concentration difficulties. Completing tasks. Managing aspects of household. Works F/T for [employer] x 5 years. Denies SI or HI.  Denies AH or VH. Denies self harm. Denies substance use.   Seeing therapist - Donalda Ewings   GAD-7    Flowsheet Row Office Visit from 07/05/2022 in [facility] Office Visit from 09/04/2021 in [facility] for [facility] Healthcare at [facility] Routine Prenatal from 09/21/2020 in [facility] for [facility] Healthcare at [facility] Office Visit from 06/29/2020 in [facility] Office Visit from 12/27/2019 in [facility]  Total GAD-7 Score PHQ2-9    Flowsheet Row Office Visit from 07/05/2022 in [facility] Office Visit  from 09/04/2021 in [facility] for [facility] Healthcare at [facility] Routine Prenatal from 09/21/2020 in [facility] for [facility] Healthcare at [facility] Office Visit from 06/29/2020 in [facility] Initial Prenatal from 06/08/2020 in [facility] for [facility] Healthcare at [facility]  PHQ-2 Total Score 0 0  PHQ-9 Total Score Flowsheet Row Admission (Discharged) from 12/06/2020 in [facility] 4S Mother Baby Unit Admission (Discharged) from 11/11/2020 in [facility] 1S Maternity Assessment Unit  C-SSRS RISK CATEGORY No Risk No Risk        Review of Systems:  Review of Systems  Musculoskeletal:  Negative for gait problem.  Neurological:  Negative for tremors.  Psychiatric/Behavioral:         Please refer to HPI    Medications: I have reviewed the patient's current medications.  Current Outpatient Medications  Medication Sig Dispense Refill   acetaminophen (TYLENOL) 325 MG tablet Take 2 tablets (650 mg total) by mouth every 4 (four) hours as needed (for pain scale < 4). 30 tablet 0   albuterol (VENTOLIN HFA) 108 (90 Base) MCG/ACT inhaler Inhale 2 puffs into the lungs every 6 (six) hours as needed for wheezing or shortness of breath. 8 g 0   buPROPion (WELLBUTRIN XL) 300 MG 24 hr tablet Take 1 tablet (300 mg total) by mouth at bedtime. 90 tablet 3   hydrOXYzine (ATARAX) 25 MG tablet Take 1 tablet (25 mg total) by mouth every 6 (six) hours as needed. 360 tablet 1   linaclotide (LINZESS) 145 MCG CAPS capsule Take 1 capsule (  145 mcg total) by mouth daily before breakfast. (Patient not taking: Reported on 09/02/2022) 30 capsule 2   medroxyPROGESTERone Acetate 150 MG/ML SUSY Inject 1 mL (150 mg total) into the muscle every 3 (three) months. 1 mL 3   ondansetron (ZOFRAN-ODT) 4 MG disintegrating tablet Dissolve 1 tablet (4 mg total) by mouth every 8 (eight) hours as needed for nausea. 30 tablet 0   pantoprazole (PROTONIX) 40 MG tablet Take 1 tablet (40 mg total)  by mouth daily. (Patient taking differently: Take 40 mg by mouth.) 30 tablet 2   sertraline (ZOLOFT) 50 MG tablet Take 1 tablet (50 mg total) by mouth daily. 90 tablet 3   No current facility-administered medications for this visit.    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   Please see review of systems for further details on the patient's review from today.   Objective:   Physical Exam:  There were no vitals taken for this visit.  Physical Exam Constitutional:      General: She is not in acute distress. Musculoskeletal:        General: No deformity.  Neurological:     Mental Status: She is alert and oriented to person, place, and time.     Coordination: Coordination normal.  Psychiatric:        Attention and Perception: Attention and perception normal. She does not perceive auditory or visual hallucinations.        Mood and Affect: Mood normal. Mood is not anxious or depressed. Affect is not labile, blunt, angry or inappropriate.        Speech: Speech normal.        Behavior: Behavior normal.        Thought Content: Thought content normal. Thought content is not paranoid or delusional. Thought content does not include homicidal or suicidal ideation. Thought content does not include homicidal or suicidal plan.        Cognition and Memory:  Cognition and memory normal.        Judgment: Judgment normal.     Comments: Insight intact     Lab Review:     Component Value Date/Time   NA 139 10/12/2021 0810   K 4.4 10/12/2021 0810   CL 102 10/12/2021 0810   CO2 22 10/12/2021 0810   GLUCOSE 85 10/12/2021 0810   GLUCOSE 101 (H) 12/06/2020 0229   BUN 11 10/12/2021 0810   CREATININE 1.04 (H) 10/12/2021 0810   CALCIUM 9.4 10/12/2021 0810   PROT 7.2 10/12/2021 0810   ALBUMIN 4.7 10/12/2021 0810   AST 12 10/12/2021 0810   ALT 13 10/12/2021 0810   ALKPHOS 111 10/12/2021 0810   BILITOT 0.6 10/12/2021 0810   GFRNONAA >60 12/06/2020 0229   GFRAA 138 06/14/2020 1651  Component Value Date/Time   WBC 8.0 10/12/2021 0810   WBC 13.2 (H) 12/06/2020 0229   RBC 4.57 10/12/2021 0810   RBC 3.95 12/06/2020 0229   HGB 14.1 10/12/2021 0810   HCT 40.0 10/12/2021 0810   PLT 262 10/12/2021 0810   MCV 88 10/12/2021 0810   MCH 30.9 10/12/2021 0810   MCH 31.4 12/06/2020 0229   MCHC 35.3 10/12/2021 0810   MCHC 35.0 12/06/2020 0229   RDW 12.7 10/12/2021 0810   LYMPHSABS 1.9 06/14/2020 1651   EOSABS 0.1 06/14/2020 1651   BASOSABS 0.0 06/14/2020 1651    No results found for: "POCLITH", "LITHIUM"   No results found for: "PHENYTOIN", "PHENOBARB", "VALPROATE", "CBMZ"   .res Assessment: Plan:    Plan:  PDMP reviewed  Wellbutrin XL 300mg  every morning - denies seizure history  Zoloft 100mg  daily  Hydroxyzine 25mg  up to 4 x times - has taken a few times.  Genesight testing reviewed  Screening tools: [facility] ADD test 39/58 - ADD possible Connors positive for ADD inattentive type  Will consider ADD medication when finished breast feeding.  Time spent with patient was 25 minutes. Greater than 50% of face to face time with patient was spent on counseling and coordination of care.    RTC 8 weeks  Patient advised to contact office with any questions, adverse effects, or acute worsening in signs and symptoms.   There  are no diagnoses linked to this encounter.   Please see After Visit Summary for patient specific instructions.  Future Appointments  Date Time Provider Department Center  11/25/2022  2:30 PM [facility] NURSE [facility]    No orders of the defined types were placed in this encounter.   -------------------------------

## 2022-09-26 DIAGNOSIS — F411 Generalized anxiety disorder: Secondary | ICD-10-CM | POA: Diagnosis not present

## 2022-10-10 DIAGNOSIS — F411 Generalized anxiety disorder: Secondary | ICD-10-CM | POA: Diagnosis not present

## 2022-10-25 DIAGNOSIS — F411 Generalized anxiety disorder: Secondary | ICD-10-CM | POA: Diagnosis not present

## 2022-11-05 DIAGNOSIS — F411 Generalized anxiety disorder: Secondary | ICD-10-CM | POA: Diagnosis not present

## 2022-11-12 ENCOUNTER — Ambulatory Visit: Payer: 59 | Admitting: Adult Health

## 2022-11-15 ENCOUNTER — Other Ambulatory Visit (HOSPITAL_COMMUNITY): Payer: Self-pay

## 2022-11-18 ENCOUNTER — Ambulatory Visit (INDEPENDENT_AMBULATORY_CARE_PROVIDER_SITE_OTHER): Payer: 59 | Admitting: Women's Health

## 2022-11-18 ENCOUNTER — Encounter: Payer: Self-pay | Admitting: Women's Health

## 2022-11-18 VITALS — BP 126/75 | HR 76 | Ht 64.0 in | Wt 144.0 lb

## 2022-11-18 DIAGNOSIS — R61 Generalized hyperhidrosis: Secondary | ICD-10-CM | POA: Diagnosis not present

## 2022-11-18 DIAGNOSIS — Z01419 Encounter for gynecological examination (general) (routine) without abnormal findings: Secondary | ICD-10-CM

## 2022-11-18 NOTE — Progress Notes (Signed)
WELL-WOMAN EXAMINATION Patient name: Brianna Griffin MRN [id number]  Date of birth: [birth year] Chief Complaint:   Gynecologic Exam  History of Present Illness:   Brianna Griffin is a 37 y.o. [phone] Caucasian female being seen today for a routine well-woman exam.  Current complaints: night sweats. No spicy foods, etoh. No other sx.  Wellbutrin and zoloft for dep/anx  PCP: Adriana Simas      does desire labs No LMP recorded. (Menstrual status: Lactating). The current method of family planning is Depo-Provera injections, no periods, occ spotting Last pap 09/04/21. Results were: NILM w/ HRHPV negative. H/O abnormal pap: no Last mammogram: never. Results were: N/A. Family h/o breast cancer: no Last colonoscopy: never. Results were: N/A. Family h/o colorectal cancer: no     11/18/2022    2:57 PM 07/05/2022    3:00 PM 09/04/2021    3:00 PM 09/21/2020    8:56 AM 06/29/2020   12:04 PM  Depression screen PHQ 2/9  Decreased Interest 0 0 0 0 0  Down, Depressed, Hopeless 1 3 1 1  0  PHQ - 2 Score 1 3 1 1  0  Altered sleeping 0 2 0 0 0  Tired, decreased energy  3 0 1 1  Change in appetite 1 3 1  0 1  Feeling bad or failure about yourself  0 3 0 2 0  Trouble concentrating 0 0 0 0 0  Moving slowly or fidgety/restless 0 0 0 0 0  Suicidal thoughts 0 0 0 0 0  PHQ-9 Score 2 14 2 4 2   Difficult doing work/chores  Very difficult   Not difficult at all        11/18/2022    2:58 PM 07/05/2022    3:00 PM 09/04/2021    3:00 PM 09/21/2020    8:56 AM  GAD 7 : Generalized Anxiety Score  Nervous, Anxious, on Edge 0 3 1 2   Control/stop worrying 0 3 0 2  Worry too much - different things 2 3 1 2   Trouble relaxing 0 2 0 2  Restless 1 2 0 0  Easily annoyed or irritable 1 3 1 3   Afraid - awful might happen 1 0 0 2  Total GAD 7 Score 5 16 3 13   Anxiety Difficulty  Very difficult       Review of Systems:   Pertinent items are noted in HPI Denies any headaches, blurred vision, fatigue, shortness of breath, chest  pain, abdominal pain, abnormal vaginal discharge/itching/odor/irritation, problems with periods, bowel movements, urination, or intercourse unless otherwise stated above. Pertinent History Reviewed:  Reviewed past medical,surgical, social and family history.  Reviewed problem list, medications and allergies. Physical Assessment:   Vitals:   11/18/22 1455  BP: 126/75  Pulse: 76  Weight: 144 lb (65.3 kg)  Height: 5\' 4"  (1.626 m)  Body mass index is 24.72 kg/m.        Physical Examination:   General appearance - well appearing, and in no distress  Mental status - alert, oriented to person, place, and time  Psych:  She has a normal mood and affect  Skin - warm and dry, normal color, no suspicious lesions noted  Chest - effort normal, all lung fields clear to auscultation bilaterally  Heart - normal rate and regular rhythm  Neck:  midline trachea, no thyromegaly or nodules  Breasts - breasts appear normal, no suspicious masses, no skin or nipple changes or  axillary nodes  Abdomen - soft, nontender, nondistended, no masses or organomegaly  Pelvic -  VULVA: normal appearing vulva with no masses, tenderness or lesions  VAGINA: normal appearing vagina with normal color and discharge, no lesions  CERVIX: normal appearing cervix without discharge or lesions, no CMT  Thin prep pap is not done   UTERUS: uterus is felt to be normal size, shape, consistency and nontender   ADNEXA: No adnexal masses or tenderness noted.  Extremities:  No swelling or varicosities noted  Chaperone: Peggy Dones    No results found for this or any previous visit (from the past 24 hour(s)).  Assessment & Plan:  1) Well-Woman Exam  2) Night sweats> no other sx, will check TSH. Discussed sometimes meds (on depo, wellbutrin and zoloft) can cause. Avoid spicy foods/etoh, cut back on caffeine close to bedtime, dress in layers, use fan  Labs/procedures today: TSH  Mammogram: @ 37yo, or sooner if problems Colonoscopy:  @ 37yo, or sooner if problems  Orders Placed This Encounter  Procedures   TSH    Meds: No orders of the defined types were placed in this encounter.   Follow-up: Return in about 1 year (around 11/18/2023) for Physical.  Cheral Marker CNM, [facility] 11/18/2022 3:40 PM

## 2022-11-19 LAB — TSH: TSH: 1.92 u[IU]/mL (ref 0.450–4.500)

## 2022-11-20 DIAGNOSIS — F411 Generalized anxiety disorder: Secondary | ICD-10-CM | POA: Diagnosis not present

## 2022-11-25 ENCOUNTER — Ambulatory Visit: Payer: 59

## 2022-11-28 ENCOUNTER — Ambulatory Visit: Payer: 59 | Admitting: *Deleted

## 2022-11-28 ENCOUNTER — Other Ambulatory Visit: Payer: Self-pay | Admitting: Oncology

## 2022-11-28 DIAGNOSIS — Z3042 Encounter for surveillance of injectable contraceptive: Secondary | ICD-10-CM

## 2022-11-28 DIAGNOSIS — Z006 Encounter for examination for normal comparison and control in clinical research program: Secondary | ICD-10-CM

## 2022-11-28 MED ORDER — MEDROXYPROGESTERONE ACETATE 150 MG/ML IM SUSP
150.0000 mg | Freq: Once | INTRAMUSCULAR | Status: AC
Start: 2022-11-28 — End: 2022-11-28
  Administered 2022-11-28: 150 mg via INTRAMUSCULAR

## 2022-11-28 NOTE — Progress Notes (Signed)
   NURSE VISIT- INJECTION  SUBJECTIVE:  Brianna Griffin is a 37 y.o. [phone] female here for a Depo Provera for contraception/period management. She is a GYN patient.   OBJECTIVE:  There were no vitals taken for this visit.  Appears well, in no apparent distress  Injection administered in: Right arm  No orders of the defined types were placed in this encounter.   ASSESSMENT: GYN patient Depo Provera for contraception/period management PLAN: Follow-up: in 11-13 weeks for next Depo   Regis Bill  11/28/2022 2:28 PM

## 2022-12-04 DIAGNOSIS — F411 Generalized anxiety disorder: Secondary | ICD-10-CM | POA: Diagnosis not present

## 2022-12-18 DIAGNOSIS — F411 Generalized anxiety disorder: Secondary | ICD-10-CM | POA: Diagnosis not present

## 2022-12-30 ENCOUNTER — Other Ambulatory Visit (HOSPITAL_COMMUNITY): Payer: Self-pay

## 2023-01-03 DIAGNOSIS — F411 Generalized anxiety disorder: Secondary | ICD-10-CM | POA: Diagnosis not present

## 2023-01-22 DIAGNOSIS — F411 Generalized anxiety disorder: Secondary | ICD-10-CM | POA: Diagnosis not present

## 2023-01-31 DIAGNOSIS — F411 Generalized anxiety disorder: Secondary | ICD-10-CM | POA: Diagnosis not present

## 2023-02-10 DIAGNOSIS — F411 Generalized anxiety disorder: Secondary | ICD-10-CM | POA: Diagnosis not present

## 2023-02-13 ENCOUNTER — Other Ambulatory Visit (HOSPITAL_COMMUNITY): Payer: Self-pay

## 2023-02-13 ENCOUNTER — Other Ambulatory Visit: Payer: Self-pay | Admitting: Advanced Practice Midwife

## 2023-02-17 MED ORDER — MEDROXYPROGESTERONE ACETATE 150 MG/ML IM SUSY
150.0000 mg | PREFILLED_SYRINGE | INTRAMUSCULAR | 3 refills | Status: DC
  Filled 2023-02-17: qty 1, 90d supply, fill #0
  Filled 2023-05-19: qty 1, 90d supply, fill #1

## 2023-02-18 ENCOUNTER — Other Ambulatory Visit (HOSPITAL_COMMUNITY): Payer: Self-pay

## 2023-02-18 ENCOUNTER — Other Ambulatory Visit: Payer: Self-pay

## 2023-02-20 ENCOUNTER — Ambulatory Visit: Payer: 59 | Admitting: *Deleted

## 2023-02-20 DIAGNOSIS — Z3042 Encounter for surveillance of injectable contraceptive: Secondary | ICD-10-CM

## 2023-02-20 MED ORDER — MEDROXYPROGESTERONE ACETATE 150 MG/ML IM SUSY
150.0000 mg | PREFILLED_SYRINGE | Freq: Once | INTRAMUSCULAR | Status: AC
Start: 2023-02-20 — End: 2023-02-20
  Administered 2023-02-20: 150 mg via INTRAMUSCULAR

## 2023-02-20 NOTE — Progress Notes (Signed)
   NURSE VISIT- INJECTION  SUBJECTIVE:  Brianna Griffin is a 37 y.o. [phone] female here for a Depo Provera for contraception/period management. She is a GYN patient.   OBJECTIVE:  There were no vitals taken for this visit.  Appears well, in no apparent distress  Injection administered in: Left deltoid  Meds ordered this encounter  Medications   medroxyPROGESTERone Acetate SUSY 150 mg    ASSESSMENT: GYN patient Depo Provera for contraception/period management PLAN: Follow-up: in 11-13 weeks for next Depo. Pt needs a refill on Zofran.    Malachy Mood  02/20/2023 3:33 PM

## 2023-02-24 DIAGNOSIS — F411 Generalized anxiety disorder: Secondary | ICD-10-CM | POA: Diagnosis not present

## 2023-02-26 ENCOUNTER — Other Ambulatory Visit: Payer: Self-pay

## 2023-02-26 ENCOUNTER — Other Ambulatory Visit: Payer: Self-pay | Admitting: Nurse Practitioner

## 2023-02-26 MED ORDER — ONDANSETRON 4 MG PO TBDP
4.0000 mg | ORAL_TABLET | Freq: Three times a day (TID) | ORAL | 0 refills | Status: DC | PRN
  Filled 2023-02-26: qty 30, 10d supply, fill #0

## 2023-03-10 DIAGNOSIS — F411 Generalized anxiety disorder: Secondary | ICD-10-CM | POA: Diagnosis not present

## 2023-03-17 ENCOUNTER — Ambulatory Visit: Payer: 59 | Admitting: Nurse Practitioner

## 2023-03-18 ENCOUNTER — Other Ambulatory Visit (HOSPITAL_COMMUNITY): Payer: Self-pay

## 2023-03-20 ENCOUNTER — Ambulatory Visit (INDEPENDENT_AMBULATORY_CARE_PROVIDER_SITE_OTHER): Payer: 59 | Admitting: Nurse Practitioner

## 2023-03-20 ENCOUNTER — Encounter: Payer: Self-pay | Admitting: Nurse Practitioner

## 2023-03-20 VITALS — BP 129/91 | HR 85 | Temp 98.4°F | Ht 64.0 in | Wt 151.4 lb

## 2023-03-20 DIAGNOSIS — J4540 Moderate persistent asthma, uncomplicated: Secondary | ICD-10-CM

## 2023-03-20 DIAGNOSIS — R61 Generalized hyperhidrosis: Secondary | ICD-10-CM | POA: Diagnosis not present

## 2023-03-20 DIAGNOSIS — K219 Gastro-esophageal reflux disease without esophagitis: Secondary | ICD-10-CM

## 2023-03-20 MED ORDER — FAMOTIDINE 20 MG PO TABS
20.0000 mg | ORAL_TABLET | Freq: Two times a day (BID) | ORAL | 0 refills | Status: DC
  Filled 2023-03-20: qty 180, 90d supply, fill #0

## 2023-03-20 MED ORDER — FLUTICASONE-SALMETEROL 100-50 MCG/ACT IN AEPB
1.0000 | INHALATION_SPRAY | Freq: Two times a day (BID) | RESPIRATORY_TRACT | 3 refills | Status: DC
  Filled 2023-03-20: qty 180, 90d supply, fill #0

## 2023-03-21 ENCOUNTER — Other Ambulatory Visit (HOSPITAL_COMMUNITY): Payer: Self-pay

## 2023-03-21 ENCOUNTER — Other Ambulatory Visit: Payer: Self-pay

## 2023-03-24 ENCOUNTER — Encounter: Payer: Self-pay | Admitting: Nurse Practitioner

## 2023-03-24 NOTE — Progress Notes (Signed)
Subjective:    Patient ID: Brianna Griffin, female    DOB: [birth year], 37 y.o.   MRN: [id number]  HPI Presents for complaints of a flareup of her asthma.  Tends to occur every fall.  Has had a cough for the past 2 to 3 months.  Minimal wheezing.  No fever.  Mild head congestion.  No sore throat or ear pain.  Currently only on albuterol inhaler.  Also complaints of night sweats.  States that this has eased up.  Currently on Depo-Provera for birth control.  [facility]  No overt reflux symptoms at this time.  States she is currently doing well on bupropion and sertraline. Social History   Tobacco Use   Smoking status: Never   Smokeless tobacco: Never  Vaping Use   Vaping status: Never Used  Substance Use Topics   Alcohol use: No    Alcohol/week: 0.0 standard drinks of alcohol   Drug use: No     Review of Systems  Constitutional:  Negative for fever.  HENT:  Positive for congestion and postnasal drip. Negative for ear pain and sore throat.   Respiratory:  Positive for cough, chest tightness and wheezing. Negative for shortness of breath.   Cardiovascular:  Negative for chest pain.  Gastrointestinal:  Negative for abdominal pain, nausea and vomiting.      03/20/2023    4:11 PM  Depression screen PHQ 2/9  Decreased Interest 0  Down, Depressed, Hopeless 1  PHQ - 2 Score 1  Altered sleeping 1  Tired, decreased energy 1  Change in appetite 0  Feeling bad or failure about yourself  1  Trouble concentrating 0  Moving slowly or fidgety/restless 0  Suicidal thoughts 0  PHQ-9 Score 4  Difficult doing work/chores Somewhat difficult      03/20/2023    4:11 PM 11/18/2022    2:58 PM 07/05/2022    3:00 PM 09/04/2021    3:00 PM  GAD 7 : Generalized Anxiety Score  Nervous, Anxious, on Edge 1 0 3 1  Control/stop worrying 1 0 3 0  Worry too much - different things 1 2 3 1   Trouble relaxing 0 0 2 0  Restless 1 1 2  0  Easily annoyed or irritable 1 1 3 1    Afraid - awful might happen 3 1 0 0  Total GAD 7 Score 8 5 16 3   Anxiety Difficulty Somewhat difficult  Very difficult           Objective:   Physical Exam NAD.  Alert, oriented.  Mildly anxious affect.  Making good eye contact.  Normal mood and behavior.  TMs retracted, no erythema.  Pharynx nonerythematous with cloudy PND noted.  Neck supple with mild soft anterior cervical adenopathy.  Lungs clear.  No tachypnea.  Heart regular rate rhythm.  Abdomen soft nondistended with mild epigastric area discomfort with palpation.  No rebound or guarding.  No obvious masses. Today's Vitals   03/20/23 1602  BP: (!) 129/91  Pulse: 85  Temp: 98.4 F (36.9 C)  SpO2: 100%  Weight: 151 lb 6.4 oz (68.7 kg)  Height: 5\' 4"  (1.626 m)   Body mass index is 25.99 kg/m.        Assessment & Plan:   Problem List Items Addressed This Visit       Respiratory   Asthma, chronic - Primary   Relevant Medications   fluticasone-salmeterol (ADVAIR) 100-50 MCG/ACT AEPB     Digestive  Gastroesophageal reflux disease without esophagitis   Relevant Medications   famotidine (PEPCID) 20 MG tablet     Other   Night sweats   Meds ordered this encounter  Medications   fluticasone-salmeterol (ADVAIR) 100-50 MCG/ACT AEPB    Sig: Inhale 1 puff into the lungs 2 (two) times daily.    Dispense:  180 each    Refill:  3    Order Specific Question:   Supervising Provider    Answer:   Lilyan Punt A [[id number]]   famotidine (PEPCID) 20 MG tablet    Sig: Take 1 tablet (20 mg total) by mouth 2 (two) times daily as needed for acid reflux    Dispense:  180 tablet    Refill:  0    Order Specific Question:   Supervising Provider    Answer:   Lilyan Punt A [[id number]]   Reviewed dietary measures that affect reflux.  Start famotidine twice daily as directed as needed. Start Advair as directed daily especially during the times of the year when her asthma flares up. Given information on Veozah a new medication to help  with hot flashes.  Patient will research this and let our office or GYN know if she wants to try this. Call back next week if no improvement in her cough or asthma symptoms, sooner if worse.

## 2023-03-26 ENCOUNTER — Ambulatory Visit: Payer: 59 | Admitting: Nurse Practitioner

## 2023-03-28 ENCOUNTER — Other Ambulatory Visit: Payer: Self-pay | Admitting: Physician Assistant

## 2023-03-28 DIAGNOSIS — J4521 Mild intermittent asthma with (acute) exacerbation: Secondary | ICD-10-CM

## 2023-03-28 DIAGNOSIS — R058 Other specified cough: Secondary | ICD-10-CM

## 2023-03-28 DIAGNOSIS — F411 Generalized anxiety disorder: Secondary | ICD-10-CM | POA: Diagnosis not present

## 2023-04-02 ENCOUNTER — Encounter: Payer: Self-pay | Admitting: Nurse Practitioner

## 2023-04-03 ENCOUNTER — Other Ambulatory Visit: Payer: Self-pay

## 2023-04-03 DIAGNOSIS — J4521 Mild intermittent asthma with (acute) exacerbation: Secondary | ICD-10-CM

## 2023-04-03 DIAGNOSIS — R058 Other specified cough: Secondary | ICD-10-CM

## 2023-04-03 MED ORDER — ALBUTEROL SULFATE HFA 108 (90 BASE) MCG/ACT IN AERS
2.0000 | INHALATION_SPRAY | Freq: Four times a day (QID) | RESPIRATORY_TRACT | 0 refills | Status: AC | PRN
Start: 2023-04-03 — End: ?
  Filled 2023-04-03: qty 6.7, 25d supply, fill #0

## 2023-04-09 DIAGNOSIS — F411 Generalized anxiety disorder: Secondary | ICD-10-CM | POA: Diagnosis not present

## 2023-04-14 ENCOUNTER — Ambulatory Visit: Payer: 59 | Admitting: Dermatology

## 2023-04-14 ENCOUNTER — Encounter: Payer: Self-pay | Admitting: Dermatology

## 2023-04-14 DIAGNOSIS — L578 Other skin changes due to chronic exposure to nonionizing radiation: Secondary | ICD-10-CM | POA: Diagnosis not present

## 2023-04-14 DIAGNOSIS — L720 Epidermal cyst: Secondary | ICD-10-CM | POA: Diagnosis not present

## 2023-04-14 DIAGNOSIS — W908XXA Exposure to other nonionizing radiation, initial encounter: Secondary | ICD-10-CM

## 2023-04-14 DIAGNOSIS — Z808 Family history of malignant neoplasm of other organs or systems: Secondary | ICD-10-CM | POA: Diagnosis not present

## 2023-04-14 DIAGNOSIS — Z1283 Encounter for screening for malignant neoplasm of skin: Secondary | ICD-10-CM

## 2023-04-14 DIAGNOSIS — D229 Melanocytic nevi, unspecified: Secondary | ICD-10-CM | POA: Diagnosis not present

## 2023-04-14 DIAGNOSIS — L918 Other hypertrophic disorders of the skin: Secondary | ICD-10-CM

## 2023-04-14 DIAGNOSIS — L814 Other melanin hyperpigmentation: Secondary | ICD-10-CM | POA: Diagnosis not present

## 2023-04-14 DIAGNOSIS — D1801 Hemangioma of skin and subcutaneous tissue: Secondary | ICD-10-CM

## 2023-04-14 DIAGNOSIS — L729 Follicular cyst of the skin and subcutaneous tissue, unspecified: Secondary | ICD-10-CM

## 2023-04-14 NOTE — Patient Instructions (Addendum)
.      Cryotherapy Aftercare  Wash gently with soap and water everyday.   Apply Vaseline and Band-Aid daily until healed.     Recommended Sunscreen      Important Information  Due to recent changes in healthcare laws, you may see results of your pathology and/or laboratory studies on MyChart before the doctors have had a chance to review them. We understand that in some cases there may be results that are confusing or concerning to you. Please understand that not all results are received at the same time and often the doctors may need to interpret multiple results in order to provide you with the best plan of care or course of treatment. Therefore, we ask that you please give [place] 2 business days to thoroughly review all your results before contacting the office for clarification. Should we see a critical lab result, you will be contacted sooner.   If You Need Anything After Your Visit  If you have any questions or concerns for your doctor, please call our main line at [phone] If no one answers, please leave a voicemail as directed and we will return your call as soon as possible. Messages left after 4 pm will be answered the following business day.   You may also send [place] a message via MyChart. We typically respond to MyChart messages within 1-2 business days.  For prescription refills, please ask your pharmacy to contact our office. Our fax number is [phone].  If you have an urgent issue when the clinic is closed that cannot wait until the next business day, you can page your doctor at the number below.    Please note that while we do our best to be available for urgent issues outside of office hours, we are not available 24/7.   If you have an urgent issue and are unable to reach [place], you may choose to seek medical care at your doctor's office, retail clinic, urgent care center, or emergency room.  If you have a medical emergency, please immediately call 911 or go to  the emergency department. In the event of inclement weather, please call our main line at [phone] for an update on the status of any delays or closures.  Dermatology Medication Tips: Please keep the boxes that topical medications come in in order to help keep track of the instructions about where and how to use these. Pharmacies typically print the medication instructions only on the boxes and not directly on the medication tubes.   If your medication is too expensive, please contact our office at [phone] or send [place] a message through MyChart.   We are unable to tell what your co-pay for medications will be in advance as this is different depending on your insurance coverage. However, we may be able to find a substitute medication at lower cost or fill out paperwork to get insurance to cover a needed medication.   If a prior authorization is required to get your medication covered by your insurance company, please allow [place] 1-2 business days to complete this process.  Drug prices often vary depending on where the prescription is filled and some pharmacies may offer cheaper prices.  The website www.goodrx.com contains coupons for medications through different pharmacies. The prices here do not account for what the cost may be with help from insurance (it may be cheaper with your insurance), but the website can give you the price if you did not use any insurance.  - You can  print the associated coupon and take it with your prescription to the pharmacy.  - You may also stop by our office during regular business hours and pick up a GoodRx coupon card.  - If you need your prescription sent electronically to a different pharmacy, notify our office through [facility] or by phone at [phone]

## 2023-04-14 NOTE — Progress Notes (Signed)
New Patient Visit   Subjective  Brianna Griffin is a 37 y.o. female who presents for the following: Total Body Skin Exam (TBSE)  Patient present today for new patient visit for TBSE.The patient reports she has spots, moles and lesions to be evaluated, some may be new or changing and the patient may have concern these could be cancer. Patient has previously been treated by a dermatologist 6 years ago at [facility]. Patient reports she has hx of bx 13 years ago with benign results. Patient reports family history of skin cancers (maternal uncle - melanoma, maternal grandfather - BCC). Patient reports throughout her lifetime has had moderate sun exposure. Currently, patient reports if she has excessive sun exposure, she does apply sunscreen and/or wears protective coverings.  The following portions of the chart were reviewed this encounter and updated as appropriate: medications, allergies, medical history  Review of Systems:  No other skin or systemic complaints except as noted in HPI or Assessment and Plan.  Objective  Well appearing patient in no apparent distress; mood and affect are within normal limits.  A full examination was performed including scalp, head, eyes, ears, nose, lips, neck, chest, axillae, abdomen, back, buttocks, bilateral upper extremities, bilateral lower extremities, hands, feet, fingers, toes, fingernails, and toenails. All findings within normal limits unless otherwise noted below.     Relevant exam findings are noted in the Assessment and Plan.  Left Inframammary Fold, Right Axilla Irritated skin tag    Assessment & Plan   LENTIGINES - Benign normal skin lesions - Benign-appearing - Call for any changes  BENIGN MELANOCYTIC NEVI - Tan-brown and/or pink-flesh-colored symmetric macules and papules - Benign appearing on exam today - Observation - Call clinic for new or changing moles - Recommend daily use of broad spectrum spf 30+ sunscreen to  sun-exposed areas.   MILD ACTINIC DAMAGE - Chronic condition, secondary to cumulative UV/sun exposure - diffuse scaly erythematous macules with underlying dyspigmentation - Recommend daily broad spectrum sunscreen SPF 30+ to sun-exposed areas, reapply every 2 hours as needed.  - Staying in the shade or wearing long sleeves, sun glasses (UVA+UVB protection) and wide brim hats (4-inch brim around the entire circumference of the hat) are also recommended for sun protection.  - Call for new or changing lesions.  Inflamed skin tag (2) Left Inframammary Fold; Right Axilla  Destruction Procedure Note Destruction method: cryotherapy   Informed consent: discussed and consent obtained   Lesion destroyed using liquid nitrogen: Yes   Outcome: patient tolerated procedure well with no complications   Post-procedure details: wound care instructions given   Locations: right axillae, left inframammary crease # of Lesions Treated: 2  Prior to procedure, discussed risks of blister formation, small wound, skin dyspigmentation, or rare scar following cryotherapy. Recommend Vaseline ointment to treated areas while healing.   Cryotherapy, skin lesion - Left Inframammary Fold, Right Axilla  Skin exam for malignant neoplasm  Multiple benign melanocytic nevi  Actinic skin damage  Lentigines  Cherry angioma   EPIDERMAL INCLUSION CYST Exam: Subcutaneous nodule at left side posterior neck  Benign-appearing. Exam most consistent with an epidermal inclusion cyst. Discussed that a cyst is a benign growth that can grow over time and sometimes get irritated or inflamed. Recommend observation if it is not bothersome. Discussed option of surgical excision to remove it if it is growing, symptomatic, or other changes noted. Please call for new or changing lesions so they can be evaluated.  SKIN CANCER SCREENING PERFORMED TODAY  Return  in about 1 year (around 04/13/2024) for TBSE.   Documentation: I have  reviewed the above documentation for accuracy and completeness, and I agree with the above.   I, Shirron Marcha Solders, CMA, am acting as scribe for [employer], DO.   Langston Reusing, DO

## 2023-04-28 DIAGNOSIS — F411 Generalized anxiety disorder: Secondary | ICD-10-CM | POA: Diagnosis not present

## 2023-05-05 ENCOUNTER — Encounter: Payer: Self-pay | Admitting: Dermatology

## 2023-05-05 ENCOUNTER — Other Ambulatory Visit (HOSPITAL_COMMUNITY): Payer: Self-pay

## 2023-05-05 DIAGNOSIS — L723 Sebaceous cyst: Secondary | ICD-10-CM

## 2023-05-05 NOTE — Telephone Encounter (Signed)
She can call and schedule an excision with Dr. Caralyn Guile.

## 2023-05-08 ENCOUNTER — Other Ambulatory Visit (HOSPITAL_COMMUNITY): Payer: Self-pay

## 2023-05-08 ENCOUNTER — Ambulatory Visit
Admission: RE | Admit: 2023-05-08 | Discharge: 2023-05-08 | Disposition: A | Discharge status: Home / Self Care | Payer: 59 | Source: Ambulatory Visit | Attending: Family Medicine | Admitting: Family Medicine

## 2023-05-08 VITALS — BP 134/84 | HR 96 | Temp 98.8°F | Resp 16

## 2023-05-08 DIAGNOSIS — L089 Local infection of the skin and subcutaneous tissue, unspecified: Secondary | ICD-10-CM | POA: Diagnosis not present

## 2023-05-08 DIAGNOSIS — L723 Sebaceous cyst: Secondary | ICD-10-CM

## 2023-05-08 MED ORDER — CEPHALEXIN 500 MG PO CAPS: 500.0000 mg | ORAL_CAPSULE | Freq: Two times a day (BID) | ORAL | 0 refills | Status: DC

## 2023-05-08 MED ORDER — DOXYCYCLINE HYCLATE 100 MG PO TABS
100.0000 mg | ORAL_TABLET | Freq: Two times a day (BID) | ORAL | 0 refills | Status: AC
  Filled 2023-05-08: qty 20, 10d supply, fill #0

## 2023-05-08 NOTE — ED Triage Notes (Signed)
Pt reports she has an abscess on her neck that looks "infected" x 2 days

## 2023-05-08 NOTE — Discharge Instructions (Signed)
In addition to taking the antibiotics, you may use warm compresses, ibuprofen and follow-up as scheduled for excision procedure

## 2023-05-08 NOTE — ED Provider Notes (Signed)
[facility]    CSN: [id number] Arrival date & time: 05/08/23  0915      History   Chief Complaint Chief Complaint  Patient presents with   Abscess    Cyst on back of neck that has been seen by dermatology. It now looks swollen and I'm concerned about infection. The excision is scheduled for January 13th, but I have a call in to the dermatologist office also. - Entered by patient    HPI Brianna Griffin is a 37 y.o. female.   Patient presenting today with an infected sebaceous cyst to the back of her neck.  States this is symptoms and present for years, recently seen by her dermatologist for a skin check and dermatologist recommended leaving it alone unless it causes any issues.  Notes the last 2 or 3 days the area has become tender, more red and swollen.  No active drainage, bleeding, fever, chills, known injury to the area.  So far not tried anything over-the-counter for symptoms.  Has an excision procedure scheduled through dermatology for 06/02/2023.    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   IBS (irritable bowel syndrome)    Migraine headache    Migraines    Miscarriage 09/09/2013   Pregnant 12/28/2014   Supervision of normal pregnancy in first trimester 03/04/2014   Supervision of other normal pregnancy 02/02/2015   Thyroid disease    hypothyroid   Vaginal discharge 01/31/2015    Patient Active Problem List   Diagnosis Date Noted   Gastroesophageal reflux disease without esophagitis 03/20/2023   Night sweats 03/20/2023   Other bursal cyst, left ankle and foot 07/06/2022   Migraine without aura and without status migrainosus, not intractable 11/02/2018   Depression with anxiety 11/13/2015   IBS 10/30/2009   Asthma, chronic 10/26/2009    History reviewed. No  pertinent surgical history.  OB History     Gravida  6   Para  3   Term  3   Preterm      AB  3   Living  3      SAB  3   IAB      Ectopic      Multiple  0   Live Births  3            Home Medications    Prior to Admission medications   Medication Sig Start Date End Date Taking? Authorizing Provider  cephALEXin (KEFLEX) 500 MG capsule Take 1 capsule (500 mg total) by mouth 2 (two) times daily. 05/08/23  Yes Particia Nearing, PA-C  acetaminophen (TYLENOL) 325 MG tablet Take 2 tablets (650 mg total) by mouth every 4 (four) hours as needed (for pain scale < 4). 12/07/20   Arabella Merles, CNM  albuterol (VENTOLIN HFA) 108 (90 Base) MCG/ACT inhaler Inhale 2 puffs into the lungs every 6 (six) hours as needed for wheezing or shortness of breath. 04/03/23   Tommie Sams, DO  buPROPion (WELLBUTRIN XL) 300 MG 24 hr tablet Take 1 tablet (300 mg total) by mouth at bedtime. 07/31/22   Mozingo, Thereasa Solo, NP  Calcium Carbonate Antacid (TUMS PO) Take by mouth.    [provider]  famotidine (PEPCID) 20 MG tablet Take 1 tablet (20 mg  total) by mouth 2 (two) times daily as needed for acid reflux 03/20/23   Sherie Don C, NP  fluticasone-salmeterol (ADVAIR) 100-50 MCG/ACT AEPB Inhale 1 puff into the lungs 2 (two) times daily. 03/20/23   Campbell Riches, NP  hydrOXYzine (ATARAX) 25 MG tablet Take 1 tablet (25 mg total) by mouth every 6 (six) hours as needed. 07/31/22   Mozingo, Thereasa Solo, NP  medroxyPROGESTERone Acetate 150 MG/ML SUSY Inject 1 mL (150 mg total) into the muscle every 3 (three) months. 02/17/23   Cresenzo-Dishmon, Scarlette Calico, CNM  ondansetron (ZOFRAN-ODT) 4 MG disintegrating tablet Dissolve 1 tablet (4 mg total) by mouth every 8 (eight) hours as needed for nausea. 02/26/23   Campbell Riches, NP  sertraline (ZOLOFT) 100 MG tablet Take 1 tablet (100 mg total) by mouth daily. 09/12/22   Mozingo, Thereasa Solo, NP    Family History Family  History  Problem Relation Age of Onset   Diabetes Maternal Grandmother    Hypertension Maternal Grandmother    Cancer Maternal Grandfather        bladder and liver   Crohn's disease Maternal Grandfather    Diabetes Father    Hypertension Father    Post-traumatic stress disorder Father    ADD / ADHD Brother    Depression Brother    ADD / ADHD Sister    Depression Sister    ADD / ADHD Daughter    Epilepsy Daughter    Epilepsy Son    ADD / ADHD Son    Autism Son     Social History Social History   Tobacco Use   Smoking status: Never   Smokeless tobacco: Never  Vaping Use   Vaping status: Never Used  Substance Use Topics   Alcohol use: No    Alcohol/week: 0.0 standard drinks of alcohol   Drug use: No     Allergies   Ibuprofen   Review of Systems Review of Systems Per HPI  Physical Exam Triage Vital Signs ED Triage Vitals  Encounter Vitals Group     BP 05/08/23 0920 134/84     Systolic BP Percentile --      Diastolic BP Percentile --      Pulse Rate 05/08/23 0920 96     Resp 05/08/23 0920 16     Temp 05/08/23 0920 98.8 F (37.1 C)     Temp Source 05/08/23 0920 Oral     SpO2 05/08/23 0920 98 %     Weight --      Height --      Head Circumference --      Peak Flow --      Pain Score 05/08/23 0922 0     Pain Loc --      Pain Education --      Exclude from Growth Chart --    No data found.  Updated Vital Signs BP 134/84 (BP Location: Right Arm)   Pulse 96   Temp 98.8 F (37.1 C) (Oral)   Resp 16   LMP 04/30/2023 (Approximate)   SpO2 98%   Visual Acuity Right Eye Distance:   Left Eye Distance:   Bilateral Distance:    Right Eye Near:   Left Eye Near:    Bilateral Near:     Physical Exam Vitals and nursing note reviewed.  Constitutional:      Appearance: Normal appearance. She is not ill-appearing.  HENT:     Head: Atraumatic.  Eyes:     Extraocular Movements: Extraocular movements  intact.     Conjunctiva/sclera: Conjunctivae normal.   Cardiovascular:     Rate and Rhythm: Normal rate and regular rhythm.     Heart sounds: Normal heart sounds.  Pulmonary:     Effort: Pulmonary effort is normal.     Breath sounds: Normal breath sounds.  Musculoskeletal:        General: Normal range of motion.     Cervical back: Normal range of motion and neck supple.  Skin:    General: Skin is warm and dry.     Findings: Erythema present.     Comments: Erythematous tender firm cyst present to left posterior neck.  No active bleeding or drainage, not indurated  Neurological:     Mental Status: She is alert and oriented to person, place, and time.     Motor: No weakness.     Gait: Gait normal.  Psychiatric:        Mood and Affect: Mood normal.        Thought Content: Thought content normal.        Judgment: Judgment normal.      UC Treatments / Results  Labs (all labs ordered are listed, but only abnormal results are displayed) Labs Reviewed - No data to display  EKG   Radiology No results found.  Procedures Procedures (including critical care time)  Medications Ordered in UC Medications - No data to display  Initial Impression / Assessment and Plan / UC Course  I have reviewed the triage vital signs and the nursing notes.  Pertinent labs & imaging results that were available during my care of the patient were reviewed by me and considered in my medical decision making (see chart for details).     Vitals and exam reassuring, has excision procedure scheduled for next month.  Will treat with Keflex, warm compresses, ibuprofen as needed and await excision.  Return for worsening symptoms.  Final Clinical Impressions(s) / UC Diagnoses   Final diagnoses:  Infected sebaceous cyst     Discharge Instructions      In addition to taking the antibiotics, you may use warm compresses, ibuprofen and follow-up as scheduled for excision procedure    ED Prescriptions     Medication Sig Dispense Auth. Provider    cephALEXin (KEFLEX) 500 MG capsule Take 1 capsule (500 mg total) by mouth 2 (two) times daily. 14 capsule Particia Nearing, [place]      PDMP not reviewed this encounter.   Roosvelt Maser [place], [place] 05/08/23 [phone]

## 2023-05-08 NOTE — Telephone Encounter (Signed)
We can send in Doxy 100mg , she will take one tablet BID with food for 10days.  With Dr Caralyn Guile out for the Holiday I don't think we can get her in any sooner.  Please send a non-visit order for the doxy and communicate this with the patient.  Thanks!

## 2023-05-15 ENCOUNTER — Ambulatory Visit: Payer: 59

## 2023-05-16 DIAGNOSIS — F411 Generalized anxiety disorder: Secondary | ICD-10-CM | POA: Diagnosis not present

## 2023-05-19 ENCOUNTER — Encounter: Payer: Self-pay | Admitting: Women's Health

## 2023-05-20 ENCOUNTER — Ambulatory Visit: Payer: 59

## 2023-05-20 ENCOUNTER — Other Ambulatory Visit: Payer: Self-pay

## 2023-05-23 ENCOUNTER — Ambulatory Visit (INDEPENDENT_AMBULATORY_CARE_PROVIDER_SITE_OTHER): Payer: Commercial Managed Care - PPO | Admitting: *Deleted

## 2023-05-23 DIAGNOSIS — Z3042 Encounter for surveillance of injectable contraceptive: Secondary | ICD-10-CM

## 2023-05-23 MED ORDER — MEDROXYPROGESTERONE ACETATE 150 MG/ML IM SUSY
150.0000 mg | PREFILLED_SYRINGE | Freq: Once | INTRAMUSCULAR | Status: AC
  Administered 2023-05-23: 150 mg via INTRAMUSCULAR

## 2023-05-23 NOTE — Progress Notes (Signed)
   NURSE VISIT- INJECTION  SUBJECTIVE:  Brianna Griffin is a 38 y.o. [phone] female here for a Depo Provera  for contraception/period management. She is a GYN patient.   OBJECTIVE:  LMP 04/30/2023 (Approximate)   Appears well, in no apparent distress  Injection administered in: Right deltoid  Meds ordered this encounter  Medications   medroxyPROGESTERone  Acetate SUSY 150 mg    ASSESSMENT: GYN patient Depo Provera  for contraception/period management PLAN: Follow-up: in 11-13 weeks for next Depo   Alan LITTIE Fischer  05/23/2023 11:03 AM

## 2023-05-29 ENCOUNTER — Encounter: Payer: Self-pay | Admitting: Dermatology

## 2023-05-30 DIAGNOSIS — F411 Generalized anxiety disorder: Secondary | ICD-10-CM | POA: Diagnosis not present

## 2023-06-02 ENCOUNTER — Encounter: Payer: Self-pay | Admitting: Dermatology

## 2023-06-02 ENCOUNTER — Ambulatory Visit: Payer: Commercial Managed Care - PPO | Admitting: Dermatology

## 2023-06-02 VITALS — BP 122/78

## 2023-06-02 DIAGNOSIS — L720 Epidermal cyst: Secondary | ICD-10-CM

## 2023-06-02 DIAGNOSIS — D485 Neoplasm of uncertain behavior of skin: Secondary | ICD-10-CM

## 2023-06-02 DIAGNOSIS — D492 Neoplasm of unspecified behavior of bone, soft tissue, and skin: Secondary | ICD-10-CM

## 2023-06-02 NOTE — Patient Instructions (Signed)

## 2023-06-02 NOTE — Progress Notes (Signed)
 Follow-Up Visit   Subjective  Brianna Griffin is a 38 y.o. female who presents for the following: Excision of neoplasm of skin of left posterior neck, referred by Dr. Alm.  The following portions of the chart were reviewed this encounter and updated as appropriate: medications, allergies, medical history  Review of Systems:  No other skin or systemic complaints except as noted in HPI or Assessment and Plan.  Objective  Well appearing patient in no apparent distress; mood and affect are within normal limits.  A focused examination was performed of the following areas: neck  Relevant physical exam findings are noted in the Assessment and Plan.   Left post neck Cystic papule   Assessment & Plan   NEOPLASM OF UNCERTAIN BEHAVIOR OF SKIN Left post neck Skin excision  Lesion length (cm):  2 Lesion width (cm):  0.2 Margin per side (cm):  2 Total excision diameter (cm):  6 Informed consent: discussed and consent obtained   Timeout: patient name, date of birth, surgical site, and procedure verified   Procedure prep:  Patient was prepped and draped in usual sterile fashion Prep type:  Isopropyl alcohol and povidone-iodine Anesthesia: the lesion was anesthetized in a standard fashion   Anesthetic:  1% lidocaine  w/ epinephrine  1-100,000 buffered w/ 8.4% NaHCO3 Instrument used: #15 blade   Hemostasis achieved with: pressure   Hemostasis achieved with comment:  Electrocautery Outcome: patient tolerated procedure well with no complications   Post-procedure details: sterile dressing applied and wound care instructions given   Dressing type: bandage and pressure dressing (mupirocin)    Skin repair Complexity:  Complex Final length (cm):  5 Timeout: patient name, date of birth, surgical site, and procedure verified   Procedure prep:  Patient was prepped and draped in usual sterile fashion Prep type:  Chlorhexidine Anesthesia: the lesion was anesthetized in a standard fashion    Anesthetic:  1% lidocaine  w/ epinephrine  1-100,000 buffered w/ 8.4% NaHCO3 Reason for type of repair: reduce tension to allow closure, reduce the risk of dehiscence, infection, and necrosis, reduce subcutaneous dead space and avoid a hematoma, allow closure of the large defect, preserve normal anatomy, preserve normal anatomical and functional relationships and enhance both functionality and cosmetic results   Undermining: area extensively undermined   Undermining comment:  Undermining defect  Subcutaneous layers (deep stitches):  Suture size:  3-0 Suture type: PDS (polydioxanone)   Stitches:  Buried vertical mattress (inverted dermal) Fine/surface layer approximation (top stitches):  Suture type: cyanoacrylate tissue glue   Stitches: simple running   Suture removal (days):  7 Hemostasis achieved with: suture and pressure Outcome: patient tolerated procedure well with no complications   Post-procedure details: sterile dressing applied and wound care instructions given   Dressing type: bandage and pressure dressing (mupirocin)   Additional details:  Dermabond followed by steristrips Specimen 1 - Surgical pathology Differential Diagnosis: Cyst vs other  Check Margins: No  The surgical wound was then cleaned, prepped, and re-anesthetized as above. Wound edges were undermined extensively along at least one entire edge and at a distance equal to or greater than the width of the defect (see wound defect size above) in order to achieve closure and decrease wound tension and anatomic distortion. Redundant tissue repair including standing cone removal was performed. Hemostasis was achieved with electrocautery. Subcutaneous and epidermal tissues were approximated with the above sutures. The surgical site was then lightly scrubbed with sterile, saline-soaked gauze. Steri-strips were applied, and the area was then bandaged using Vaseline ointment, non-adherent  gauze, gauze pads, and tape to provide an  adequate pressure dressing. The patient tolerated the procedure well, was given detailed written and verbal wound care instructions, and was discharged in good condition.   The patient will follow-up: PRN.  Return if symptoms worsen or fail to improve.  I, Roseline Hutchinson, CMA, am acting as scribe for RUFUS CHRISTELLA HOLY, MD .   Documentation: I have reviewed the above documentation for accuracy and completeness, and I agree with the above.  RUFUS CHRISTELLA HOLY, MD

## 2023-06-03 LAB — SURGICAL PATHOLOGY

## 2023-06-06 ENCOUNTER — Other Ambulatory Visit (HOSPITAL_COMMUNITY): Payer: Self-pay

## 2023-06-06 ENCOUNTER — Other Ambulatory Visit: Payer: Self-pay

## 2023-06-06 ENCOUNTER — Other Ambulatory Visit: Payer: Self-pay | Admitting: Nurse Practitioner

## 2023-06-06 MED ORDER — ONDANSETRON 4 MG PO TBDP
4.0000 mg | ORAL_TABLET | Freq: Three times a day (TID) | ORAL | 0 refills | Status: DC | PRN
  Filled 2023-06-06: qty 30, 10d supply, fill #0

## 2023-06-12 DIAGNOSIS — F411 Generalized anxiety disorder: Secondary | ICD-10-CM | POA: Diagnosis not present

## 2023-06-25 ENCOUNTER — Ambulatory Visit: Payer: Commercial Managed Care - PPO | Admitting: Women's Health

## 2023-06-25 ENCOUNTER — Encounter: Payer: Self-pay | Admitting: Women's Health

## 2023-06-25 VITALS — BP 146/86 | HR 90 | Ht 64.0 in | Wt 157.0 lb

## 2023-06-25 DIAGNOSIS — R030 Elevated blood-pressure reading, without diagnosis of hypertension: Secondary | ICD-10-CM | POA: Diagnosis not present

## 2023-06-25 DIAGNOSIS — Z30018 Encounter for initial prescription of other contraceptives: Secondary | ICD-10-CM

## 2023-06-25 MED ORDER — PHEXXI 1.8-1-0.4 % VA GEL: 1.0000 | Freq: Once | VAGINAL | 11 refills | Status: AC

## 2023-06-25 NOTE — Progress Notes (Signed)
 GYN VISIT Patient name: Brianna Griffin MRN [id number]  Date of birth: [birth year] Chief Complaint:   Contraception (Currently on Depo.  Wants to discuss other options)  History of Present Illness:   Brianna Griffin is a 38 y.o. [phone] Caucasian female being seen today to discuss birth control. Has been on depo for a few years, was interested in a non-hormonal option. Discussed Paragard and Phexxi . Wants to try Phexxi  for now, may be interested in Paragard later.  BP elevated, was slightly elevated at visit in Oct w/ PCP. Had some borderline bp's during 2022 pregnancy. States they're always normal when she checks at home.  No LMP recorded. Patient has had an injection. The current method of family planning is Depo-Provera  injections.  Last pap 09/04/21. Results were: NILM w/ HRHPV negative     03/20/2023    4:11 PM 11/18/2022    2:57 PM 07/05/2022    3:00 PM 09/04/2021    3:00 PM 09/21/2020    8:56 AM  Depression screen PHQ 2/9  Decreased Interest 0 0 0 0 0  Down, Depressed, Hopeless 1 1 3 1 1   PHQ - 2 Score 1 1 3 1 1   Altered sleeping 1 0 2 0 0  Tired, decreased energy 1  3 0 1  Change in appetite 0 1 3 1  0  Feeling bad or failure about yourself  1 0 3 0 2  Trouble concentrating 0 0 0 0 0  Moving slowly or fidgety/restless 0 0 0 0 0  Suicidal thoughts 0 0 0 0 0  PHQ-9 Score 4 2 14 2 4   Difficult doing work/chores Somewhat difficult  Very difficult          03/20/2023    4:11 PM 11/18/2022    2:58 PM 07/05/2022    3:00 PM 09/04/2021    3:00 PM  GAD 7 : Generalized Anxiety Score  Nervous, Anxious, on Edge 1 0 3 1  Control/stop worrying 1 0 3 0  Worry too much - different things 1 2 3 1   Trouble relaxing 0 0 2 0  Restless 1 1 2  0  Easily annoyed or irritable 1 1 3 1   Afraid - awful might happen 3 1 0 0  Total GAD 7 Score 8 5 16 3   Anxiety Difficulty Somewhat difficult  Very difficult      Review of Systems:   Pertinent items are noted in HPI Denies fever/chills, dizziness,  headaches, visual disturbances, fatigue, shortness of breath, chest pain, abdominal pain, vomiting, abnormal vaginal discharge/itching/odor/irritation, problems with periods, bowel movements, urination, or intercourse unless otherwise stated above.  Pertinent History Reviewed:  Reviewed past medical,surgical, social, obstetrical and family history.  Reviewed problem list, medications and allergies. Physical Assessment:   Vitals:   06/25/23 1026 06/25/23 1031  BP: (!) 142/93 (!) 146/86  Pulse: 90   Weight: 157 lb (71.2 kg)   Height: 5' 4 (1.626 m)   Body mass index is 26.95 kg/m.       Physical Examination:   General appearance: alert, well appearing, and in no distress  Mental status: alert, oriented to person, place, and time  Skin: warm & dry   Cardiovascular: normal heart rate noted  Respiratory: normal respiratory effort, no distress  Abdomen: soft, non-tender   Pelvic: examination not indicated  Extremities: no edema   Chaperone: N/A    No results found for this or any previous visit (from the past 24 hours).  Assessment & Plan:  1) Contraception  management> wants non-hormonal and decided on Phexxi , discussed use and rx sent to [facility]. If decides for Paragard, call to schedule and no sex 2wk prior to insertion  2) Elevated bp> pt to notify PCP, check home bps q 1-2d, if elevated let PCP know  Meds:  Meds ordered this encounter  Medications   Lactic Ac-Citric Ac-Pot Bitart (PHEXXI ) 1.8-1-0.4 % GEL    Sig: Place 1 Applicatorful vaginally once for 1 dose. Up to 1 hour before sex    Dispense:  180 g    Refill:  11    No orders of the defined types were placed in this encounter.   Return in about 1 year (around 06/24/2024) for Pap & physical; cancel next depo.  Suzen JONELLE Fetters CNM, [facility] 06/25/2023 10:50 AM

## 2023-06-26 ENCOUNTER — Encounter: Payer: Self-pay | Admitting: Nurse Practitioner

## 2023-06-26 DIAGNOSIS — F411 Generalized anxiety disorder: Secondary | ICD-10-CM | POA: Diagnosis not present

## 2023-07-10 DIAGNOSIS — F411 Generalized anxiety disorder: Secondary | ICD-10-CM | POA: Diagnosis not present

## 2023-07-15 ENCOUNTER — Telehealth: Payer: Commercial Managed Care - PPO | Admitting: Physician Assistant

## 2023-07-15 DIAGNOSIS — J101 Influenza due to other identified influenza virus with other respiratory manifestations: Secondary | ICD-10-CM

## 2023-07-15 MED ORDER — OSELTAMIVIR PHOSPHATE 75 MG PO CAPS: 75.0000 mg | ORAL_CAPSULE | Freq: Two times a day (BID) | ORAL | 0 refills | Status: DC

## 2023-07-15 NOTE — Progress Notes (Signed)
 E visit for Flu like symptoms   We are sorry that you are not feeling well.  Here is how we plan to help! Based on what you have shared with me it looks like you may have a respiratory virus that may be influenza.  Influenza or "the flu" is   an infection caused by a respiratory virus. The flu virus is highly contagious and persons who did not receive their yearly flu vaccination may "catch" the flu from close contact.  We have anti-viral medications to treat the viruses that cause this infection. They are not a "cure" and only shorten the course of the infection. These prescriptions are most effective when they are given within the first 2 days of "flu" symptoms. Antiviral medication are indicated if you have a high risk of complications from the flu. You should  also consider an antiviral medication if you are in close contact with someone who is at risk. These medications can help patients avoid complications from the flu  but have side effects that you should know. Possible side effects from Tamiflu or oseltamivir include nausea, vomiting, diarrhea, dizziness, headaches, eye redness, sleep problems or other respiratory symptoms. You should not take Tamiflu if you have an allergy to oseltamivir or any to the ingredients in Tamiflu.  Based upon your symptoms and potential risk factors I have prescribed Oseltamivir (Tamiflu).  It has been sent to your designated pharmacy.  You will take one 75 mg capsule orally twice a day for the next 5 days.  ANYONE WHO HAS FLU SYMPTOMS SHOULD: Stay home. The flu is highly contagious and going out or to work exposes others! Be sure to drink plenty of fluids. Water is fine as well as fruit juices, sodas and electrolyte beverages. You may want to stay away from caffeine or alcohol. If you are nauseated, try taking small sips of liquids. How do you know if you are getting enough fluid? Your urine should be a pale yellow or almost colorless. Get rest. Taking a steamy  shower or using a humidifier may help nasal congestion and ease sore throat pain. Using a saline nasal spray works much the same way. Cough drops, hard candies and sore throat lozenges may ease your cough. Line up a caregiver. Have someone check on you regularly.   GET HELP RIGHT AWAY IF: You cannot keep down liquids or your medications. You become short of breath Your fell like you are going to pass out or loose consciousness. Your symptoms persist after you have completed your treatment plan MAKE SURE YOU  Understand these instructions. Will watch your condition. Will get help right away if you are not doing well or get worse.  Your e-visit answers were reviewed by a board certified advanced clinical practitioner to complete your personal care plan.  Depending on the condition, your plan could have included both over the counter or prescription medications.  If there is a problem please reply  once you have received a response from your provider.  Your safety is important to [place].  If you have drug allergies check your prescription carefully.    You can use MyChart to ask questions about today's visit, request a non-urgent call back, or ask for a work or school excuse for 24 hours related to this e-Visit. If it has been greater than 24 hours you will need to follow up with your provider, or enter a new e-Visit to address those concerns.  You will get an e-mail in the next  two days asking about your experience.  I hope that your e-visit has been valuable and will speed your recovery. Thank you for using e-visits.  I have spent 5 minutes in review of e-visit questionnaire, review and updating patient chart, medical decision making and response to patient.   Margaretann Loveless, PA-C

## 2023-07-24 DIAGNOSIS — F411 Generalized anxiety disorder: Secondary | ICD-10-CM | POA: Diagnosis not present

## 2023-08-03 ENCOUNTER — Ambulatory Visit
Admission: RE | Admit: 2023-08-03 | Discharge: 2023-08-03 | Discharge status: Home / Self Care | Source: Ambulatory Visit | Attending: Nurse Practitioner

## 2023-08-03 ENCOUNTER — Ambulatory Visit

## 2023-08-03 VITALS — BP 137/91 | HR 107 | Temp 98.6°F | Resp 16

## 2023-08-03 DIAGNOSIS — M542 Cervicalgia: Secondary | ICD-10-CM | POA: Diagnosis not present

## 2023-08-03 DIAGNOSIS — R519 Headache, unspecified: Secondary | ICD-10-CM

## 2023-08-03 MED ORDER — METHOCARBAMOL 500 MG PO TABS
500.0000 mg | ORAL_TABLET | Freq: Two times a day (BID) | ORAL | 0 refills | Status: DC
  Filled 2023-08-03: qty 20, 10d supply, fill #0

## 2023-08-03 MED ORDER — DEXAMETHASONE SODIUM PHOSPHATE 10 MG/ML IJ SOLN
10.0000 mg | INTRAMUSCULAR | Status: AC
  Administered 2023-08-03: 10 mg via INTRAMUSCULAR

## 2023-08-03 NOTE — ED Provider Notes (Signed)
 [facility]    CSN: [id number] Arrival date & time: 08/03/23  1359      History   Chief Complaint Chief Complaint  Patient presents with   Headache    I have a had a persistent headache for probably 2 weeks. Feels like a pulled muscle on the left side of my neck that may be contributing. - Entered by patient    HPI Brianna Griffin is a 38 y.o. female.   The history is provided by the patient.   Patient presents for complaints of headache, and left-sided neck pain that started over the past 2 weeks.  Patient states that she suspects she may have pulled a muscle in the left side of her neck, she states since that time, she has noticed headache pain.  She states that the headache pain is "all over" and describes the pain as a "pulling."  She states that she has been taking over-the-counter Tylenol with some relief of her symptoms.  She denies numbness, tingling, radiation of pain, trouble swallowing, decreased range of motion of her head or neck, nausea, vomiting, light sensitivity, sound sensitivity, or dizziness.  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   IBS (irritable bowel syndrome)    Migraine headache    Migraines    Miscarriage 09/09/2013   Thyroid disease    hypothyroid   Vaginal Pap smear, abnormal     Patient Active Problem List   Diagnosis Date Noted   Gastroesophageal reflux disease without esophagitis 03/20/2023   Night sweats 03/20/2023   Other bursal cyst, left ankle and foot 07/06/2022   Elevated BP without diagnosis of hypertension 07/23/2019   Migraine without aura and without status migrainosus, not intractable 11/02/2018   Depression with anxiety 11/13/2015   IBS 10/30/2009   Asthma, chronic 10/26/2009    Past Surgical History:  Procedure  Laterality Date   CYST REMOVAL NECK      OB History     Gravida  6   Para  3   Term  3   Preterm      AB  3   Living  3      SAB  3   IAB      Ectopic      Multiple  0   Live Births  3            Home Medications    Prior to Admission medications   Medication Sig Start Date End Date Taking? Authorizing Provider  acetaminophen (TYLENOL) 325 MG tablet Take 2 tablets (650 mg total) by mouth every 4 (four) hours as needed (for pain scale < 4). 12/07/20   Arabella Merles, CNM  albuterol (VENTOLIN HFA) 108 (90 Base) MCG/ACT inhaler Inhale 2 puffs into the lungs every 6 (six) hours as needed for wheezing or shortness of breath. 04/03/23   Tommie Sams, DO  buPROPion (WELLBUTRIN XL) 300 MG 24 hr tablet Take 1 tablet (300 mg total) by mouth at bedtime. 07/31/22   Mozingo, Thereasa Solo, NP  Calcium Carbonate Antacid (TUMS PO) Take by mouth.    [provider]  famotidine (PEPCID) 20 MG tablet Take 1 tablet (20 mg total) by mouth 2 (two) times daily as needed for acid reflux 03/20/23  Sherie Don C, NP  fluticasone-salmeterol (ADVAIR) 100-50 MCG/ACT AEPB Inhale 1 puff into the lungs 2 (two) times daily. 03/20/23   Campbell Riches, NP  hydrOXYzine (ATARAX) 25 MG tablet Take 1 tablet (25 mg total) by mouth every 6 (six) hours as needed. 07/31/22   Mozingo, Thereasa Solo, NP  medroxyPROGESTERone Acetate 150 MG/ML SUSY Inject 1 mL (150 mg total) into the muscle every 3 (three) months. 02/17/23   Cresenzo-Dishmon, Scarlette Calico, CNM  ondansetron (ZOFRAN-ODT) 4 MG disintegrating tablet Dissolve 1 tablet (4 mg total) by mouth every 8 (eight) hours as needed for nausea. 06/06/23   Tommie Sams, DO  oseltamivir (TAMIFLU) 75 MG capsule Take 1 capsule (75 mg total) by mouth 2 (two) times daily. 07/15/23   Margaretann Loveless, PA-C  sertraline (ZOLOFT) 100 MG tablet Take 1 tablet (100 mg total) by mouth daily. 09/12/22   Mozingo, Thereasa Solo, NP    Family  History Family History  Problem Relation Age of Onset   Diabetes Maternal Grandmother    Hypertension Maternal Grandmother    Cancer Maternal Grandfather        bladder and liver   Crohn's disease Maternal Grandfather    Diabetes Father    Hypertension Father    Post-traumatic stress disorder Father    ADD / ADHD Brother    Depression Brother    ADD / ADHD Sister    Depression Sister    ADD / ADHD Daughter    Epilepsy Daughter    Epilepsy Son    ADD / ADHD Son    Autism Son     Social History Social History   Tobacco Use   Smoking status: Never   Smokeless tobacco: Never  Vaping Use   Vaping status: Never Used  Substance Use Topics   Alcohol use: No    Alcohol/week: 0.0 standard drinks of alcohol   Drug use: No     Allergies   Ibuprofen   Review of Systems Review of Systems Per HPI  Physical Exam Triage Vital Signs ED Triage Vitals  Encounter Vitals Group     BP 08/03/23 1410 (!) 137/91     Systolic BP Percentile --      Diastolic BP Percentile --      Pulse Rate 08/03/23 1410 (!) 107     Resp 08/03/23 1410 16     Temp 08/03/23 1410 98.6 F (37 C)     Temp Source 08/03/23 1410 Oral     SpO2 08/03/23 1410 98 %     Weight --      Height --      Head Circumference --      Peak Flow --      Pain Score 08/03/23 1414 3     Pain Loc --      Pain Education --      Exclude from Growth Chart --    No data found.  Updated Vital Signs BP (!) 137/91 (BP Location: Right Arm)   Pulse (!) 107   Temp 98.6 F (37 C) (Oral)   Resp 16   LMP  (LMP Unknown)   SpO2 98%   Visual Acuity Right Eye Distance:   Left Eye Distance:   Bilateral Distance:    Right Eye Near:   Left Eye Near:    Bilateral Near:     Physical Exam Vitals and nursing note reviewed.  Constitutional:      Appearance: She is well-developed.  HENT:  Head: Normocephalic.     Right Ear: Tympanic membrane, ear canal and external ear normal.     Left Ear: Tympanic membrane, ear  canal and external ear normal.  Eyes:     Extraocular Movements: Extraocular movements intact.     Pupils: Pupils are equal, round, and reactive to light.  Neck:   Cardiovascular:     Rate and Rhythm: Normal rate and regular rhythm.     Pulses: Normal pulses.     Heart sounds: Normal heart sounds.  Pulmonary:     Effort: Pulmonary effort is normal. No respiratory distress.     Breath sounds: Normal breath sounds. No stridor. No wheezing, rhonchi or rales.  Abdominal:     General: Bowel sounds are normal.     Palpations: Abdomen is soft.  Musculoskeletal:     Cervical back: Normal range of motion. No erythema or signs of trauma. Muscular tenderness present. No spinous process tenderness. Normal range of motion.  Lymphadenopathy:     Cervical: No cervical adenopathy.  Skin:    General: Skin is warm and dry.  Neurological:     General: No focal deficit present.     Mental Status: She is alert and oriented to person, place, and time.  Psychiatric:        Mood and Affect: Mood normal.        Behavior: Behavior normal.      UC Treatments / Results  Labs (all labs ordered are listed, but only abnormal results are displayed) Labs Reviewed - No data to display  EKG   Radiology No results found.  Procedures Procedures (including critical care time)  Medications Ordered in UC Medications - No data to display  Initial Impression / Assessment and Plan / UC Course  I have reviewed the triage vital signs and the nursing notes.  Pertinent labs & imaging results that were available during my care of the patient were reviewed by me and considered in my medical decision making (see chart for details).  Patient with complaints of neck pain, and headache.  Suspect headache most likely an exacerbation of the patient's neck pain.  Decadron 10 mg IM administered for muscle inflammation.  Will start patient on methocarbamol 500 mg for neck pain, spasm, and stiffness.  Supportive care  recommendations were provided and discussed with the patient to include over-the-counter analgesics, gentle range of motion exercises, and the use of ice or heat.  Discussed strict ER follow-up precautions.  Patient was in agreement with this plan of care and verbalized understanding.  All questions were answered.  Patient stable for discharge.  Final Clinical Impressions(s) / UC Diagnoses   Final diagnoses:  None   Discharge Instructions   None    ED Prescriptions   None    PDMP not reviewed this encounter.   Abran Cantor, NP 08/03/23 1429

## 2023-08-03 NOTE — Discharge Instructions (Addendum)
 You were given an injection of Decadron 10 mg today. Take medication as prescribed.  Please be advised that the medication may cause drowsiness.  No driving, operating heavy equipment, or drinking alcohol when taking the medication. Continue Tylenol as needed. Recommend the use of ice or heat.  Apply ice for pain or swelling, heat for spasm or stiffness.  Apply for 20 minutes, remove for 1 hour, repeat as needed. Gentle range of motion exercises of your head and neck. Go to the emergency department immediately if you experience any inability to move your head or neck, trouble swallowing, or other concerns. If symptoms fail to improve, please follow-up with your primary care physician. Follow-up as needed.

## 2023-08-03 NOTE — ED Triage Notes (Signed)
 Pt reports pulled muscle in the back neck thinks it may be causing the headache she has been experiencing the past 2 weeks.

## 2023-08-04 ENCOUNTER — Other Ambulatory Visit: Payer: Self-pay

## 2023-08-04 ENCOUNTER — Other Ambulatory Visit (HOSPITAL_COMMUNITY): Payer: Self-pay

## 2023-08-05 ENCOUNTER — Ambulatory Visit
Admission: EM | Admit: 2023-08-05 | Discharge: 2023-08-05 | Disposition: A | Discharge status: Home / Self Care | Attending: Family Medicine | Admitting: Family Medicine

## 2023-08-05 ENCOUNTER — Other Ambulatory Visit: Payer: Self-pay

## 2023-08-05 ENCOUNTER — Telehealth: Admitting: Physician Assistant

## 2023-08-05 ENCOUNTER — Encounter: Payer: Self-pay | Admitting: Emergency Medicine

## 2023-08-05 DIAGNOSIS — H6990 Unspecified Eustachian tube disorder, unspecified ear: Secondary | ICD-10-CM

## 2023-08-05 DIAGNOSIS — H66001 Acute suppurative otitis media without spontaneous rupture of ear drum, right ear: Secondary | ICD-10-CM

## 2023-08-05 MED ORDER — FLUTICASONE PROPIONATE 50 MCG/ACT NA SUSP: 1.0000 | Freq: Two times a day (BID) | NASAL | 2 refills | Status: DC

## 2023-08-05 MED ORDER — AMOXICILLIN 875 MG PO TABS: 875.0000 mg | ORAL_TABLET | Freq: Two times a day (BID) | ORAL | 0 refills | Status: DC

## 2023-08-05 MED ORDER — PSEUDOEPHEDRINE HCL 60 MG PO TABS: 60.0000 mg | ORAL_TABLET | Freq: Three times a day (TID) | ORAL | 0 refills | Status: DC | PRN

## 2023-08-05 MED ORDER — IPRATROPIUM BROMIDE 0.03 % NA SOLN
2.0000 | Freq: Two times a day (BID) | NASAL | 0 refills | Status: DC
Start: 2023-08-05 — End: 2023-08-23

## 2023-08-05 NOTE — ED Triage Notes (Signed)
 Pt reports intermittent right ear pain since last night. Pt denies any known injury.

## 2023-08-05 NOTE — ED Provider Notes (Signed)
 [facility]    CSN: [id number] Arrival date & time: 08/05/23  1845      History   Chief Complaint Chief Complaint  Patient presents with   Ear Pain    HPI Brianna Griffin is a 38 y.o. female.   Patient presenting today with new onset sharp stabbing right ear pain progressively worsening since last night.  Denies fever, chills, drainage, headache, loss of hearing, nausea, vomiting.  So far trying over-the-counter eardrops and pain relievers with minimal relief.    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   IBS (irritable bowel syndrome)    Migraine headache    Migraines    Miscarriage 09/09/2013   Thyroid disease    hypothyroid   Vaginal Pap smear, abnormal     Patient Active Problem List   Diagnosis Date Noted   Gastroesophageal reflux disease without esophagitis 03/20/2023   Night sweats 03/20/2023   Other bursal cyst, left ankle and foot 07/06/2022   Elevated BP without diagnosis of hypertension 07/23/2019   Migraine without aura and without status migrainosus, not intractable 11/02/2018   Depression with anxiety 11/13/2015   IBS 10/30/2009   Asthma, chronic 10/26/2009    Past Surgical History:  Procedure Laterality Date   CYST REMOVAL NECK      OB History     Gravida  6   Para  3   Term  3   Preterm      AB  3   Living  3      SAB  3   IAB      Ectopic      Multiple  0   Live Births  3            Home Medications    Prior to Admission medications   Medication Sig Start Date End Date Taking? Authorizing Provider  amoxicillin (AMOXIL) 875 MG tablet Take 1 tablet (875 mg total) by mouth 2 (two) times daily. 08/05/23  Yes Particia Nearing, PA-C  fluticasone [facility]) 50 MCG/ACT nasal spray Place 1 spray into both  nostrils 2 (two) times daily. 08/05/23  Yes Particia Nearing, PA-C  pseudoephedrine (SUDAFED) 60 MG tablet Take 1 tablet (60 mg total) by mouth every 8 (eight) hours as needed for congestion. 08/05/23  Yes Particia Nearing, PA-C  acetaminophen (TYLENOL) 325 MG tablet Take 2 tablets (650 mg total) by mouth every 4 (four) hours as needed (for pain scale < 4). 12/07/20   Arabella Merles, CNM  albuterol (VENTOLIN HFA) 108 (90 Base) MCG/ACT inhaler Inhale 2 puffs into the lungs every 6 (six) hours as needed for wheezing or shortness of breath. 04/03/23   Tommie Sams, DO  buPROPion (WELLBUTRIN XL) 300 MG 24 hr tablet Take 1 tablet (300 mg total) by mouth at bedtime. 07/31/22   Mozingo, Thereasa Solo, NP  Calcium Carbonate Antacid (TUMS PO) Take by mouth.    [provider]  famotidine (PEPCID) 20 MG tablet Take 1 tablet (20 mg total) by mouth 2 (two) times daily as needed for acid reflux 03/20/23   Campbell Riches, NP  fluticasone-salmeterol (ADVAIR) 100-50 MCG/ACT AEPB Inhale 1 puff into the lungs 2 (two) times daily. 03/20/23   Campbell Riches, NP  hydrOXYzine (  ATARAX) 25 MG tablet Take 1 tablet (25 mg total) by mouth every 6 (six) hours as needed. 07/31/22   Mozingo, Thereasa Solo, NP  ipratropium (ATROVENT) 0.03 % nasal spray Place 2 sprays into both nostrils every 12 (twelve) hours. 08/05/23   Waldon Merl, PA-C  medroxyPROGESTERone Acetate 150 MG/ML SUSY Inject 1 mL (150 mg total) into the muscle every 3 (three) months. 02/17/23   Cresenzo-Dishmon, Scarlette Calico, CNM  methocarbamol (ROBAXIN) 500 MG tablet Take 1 tablet (500 mg total) by mouth 2 (two) times daily. 08/03/23   Leath-Warren, Sadie Haber, NP  ondansetron (ZOFRAN-ODT) 4 MG disintegrating tablet Dissolve 1 tablet (4 mg total) by mouth every 8 (eight) hours as needed for nausea. 06/06/23   Tommie Sams, DO  oseltamivir (TAMIFLU) 75 MG capsule Take 1 capsule (75 mg total) by mouth 2 (two) times daily. 07/15/23   Margaretann Loveless, PA-C  sertraline (ZOLOFT) 100 MG tablet Take 1 tablet (100 mg total) by mouth daily. 09/12/22   Mozingo, Thereasa Solo, NP    Family History Family History  Problem Relation Age of Onset   Diabetes Maternal Grandmother    Hypertension Maternal Grandmother    Cancer Maternal Grandfather        bladder and liver   Crohn's disease Maternal Grandfather    Diabetes Father    Hypertension Father    Post-traumatic stress disorder Father    ADD / ADHD Brother    Depression Brother    ADD / ADHD Sister    Depression Sister    ADD / ADHD Daughter    Epilepsy Daughter    Epilepsy Son    ADD / ADHD Son    Autism Son     Social History Social History   Tobacco Use   Smoking status: Never   Smokeless tobacco: Never  Vaping Use   Vaping status: Never Used  Substance Use Topics   Alcohol use: No    Alcohol/week: 0.0 standard drinks of alcohol   Drug use: No     Allergies   Ibuprofen   Review of Systems Review of Systems Per HPI  Physical Exam Triage Vital Signs ED Triage Vitals  Encounter Vitals Group     BP 08/05/23 1905 129/82     Systolic BP Percentile --      Diastolic BP Percentile --      Pulse Rate 08/05/23 1905 96     Resp 08/05/23 1905 18     Temp 08/05/23 1905 98.6 F (37 C)     Temp Source 08/05/23 1905 Oral     SpO2 08/05/23 1905 99 %     Weight --      Height --      Head Circumference --      Peak Flow --      Pain Score 08/05/23 1903 4     Pain Loc --      Pain Education --      Exclude from Growth Chart --    No data found.  Updated Vital Signs BP 129/82 (BP Location: Right Arm)   Pulse 96   Temp 98.6 F (37 C) (Oral)   Resp 18   LMP  (LMP Unknown)   SpO2 99%   Visual Acuity Right Eye Distance:   Left Eye Distance:   Bilateral Distance:    Right Eye Near:   Left Eye Near:    Bilateral Near:     Physical Exam Vitals and nursing note reviewed.  Constitutional:      Appearance: Normal appearance. She is not  ill-appearing.  HENT:     Head: Atraumatic.     Left Ear: Tympanic membrane normal.     Ears:     Comments: Right TM erythematous and edematous    Nose: Nose normal.     Mouth/Throat:     Mouth: Mucous membranes are moist.     Pharynx: Oropharynx is clear. No posterior oropharyngeal erythema.  Eyes:     Extraocular Movements: Extraocular movements intact.     Conjunctiva/sclera: Conjunctivae normal.  Cardiovascular:     Rate and Rhythm: Normal rate.  Pulmonary:     Effort: Pulmonary effort is normal.  Musculoskeletal:        General: Normal range of motion.     Cervical back: Normal range of motion and neck supple.  Skin:    General: Skin is warm and dry.  Neurological:     Mental Status: She is alert and oriented to person, place, and time.  Psychiatric:        Mood and Affect: Mood normal.        Thought Content: Thought content normal.        Judgment: Judgment normal.      UC Treatments / Results  Labs (all labs ordered are listed, but only abnormal results are displayed) Labs Reviewed - No data to display  EKG   Radiology No results found.  Procedures Procedures (including critical care time)  Medications Ordered in UC Medications - No data to display  Initial Impression / Assessment and Plan / UC Course  I have reviewed the triage vital signs and the nursing notes.  Pertinent labs & imaging results that were available during my care of the patient were reviewed by me and considered in my medical decision making (see chart for details).     Treat with Amoxil, Flonase, Sudafed, supportive over-the-counter medications and home care.  Return for worsening symptoms.  Final Clinical Impressions(s) / UC Diagnoses   Final diagnoses:  Acute suppurative otitis media of right ear without spontaneous rupture of tympanic membrane, recurrence not specified   Discharge Instructions   None    ED Prescriptions     Medication Sig Dispense Auth. Provider    amoxicillin (AMOXIL) 875 MG tablet Take 1 tablet (875 mg total) by mouth 2 (two) times daily. 20 tablet Particia Nearing, PA-C   fluticasone [facility]) 50 MCG/ACT nasal spray Place 1 spray into both nostrils 2 (two) times daily. 16 g Particia Nearing, [place]   pseudoephedrine (SUDAFED) 60 MG tablet Take 1 tablet (60 mg total) by mouth every 8 (eight) hours as needed for congestion. 15 tablet Particia Nearing, [place]      PDMP not reviewed this encounter.   Particia Nearing, [place] 08/05/23 1933

## 2023-08-05 NOTE — Progress Notes (Signed)

## 2023-08-05 NOTE — Progress Notes (Signed)
 I have spent 5 minutes in review of e-visit questionnaire, review and updating patient chart, medical decision making and response to patient.   Piedad Climes, PA-C

## 2023-08-12 ENCOUNTER — Encounter: Payer: Self-pay | Admitting: Nurse Practitioner

## 2023-08-12 ENCOUNTER — Other Ambulatory Visit (HOSPITAL_COMMUNITY): Payer: Self-pay

## 2023-08-14 ENCOUNTER — Other Ambulatory Visit (HOSPITAL_COMMUNITY): Payer: Self-pay

## 2023-08-15 ENCOUNTER — Telehealth: Admitting: Physician Assistant

## 2023-08-15 DIAGNOSIS — R42 Dizziness and giddiness: Secondary | ICD-10-CM

## 2023-08-15 NOTE — Progress Notes (Signed)
  Because of new onset dizziness with drainage from ear despite treatment for infection, I feel your condition warrants further evaluation and I recommend that you be seen in a face-to-face visit.   NOTE: There will be NO CHARGE for this E-Visit   If you are having a true medical emergency, please call 911.     For an urgent face to face visit, [place] has multiple urgent care centers for your convenience.  Click the link below for the full list of locations and hours, walk-in wait times, appointment scheduling options and driving directions:  Urgent Care - [place], [place], [place], [place], [place], [place]  [place]     Your MyChart E-visit questionnaire answers were reviewed by a board certified advanced clinical practitioner to complete your personal care plan based on your specific symptoms.    Thank you for using e-Visits.

## 2023-08-18 ENCOUNTER — Ambulatory Visit: Admitting: Nurse Practitioner

## 2023-08-18 VITALS — BP 128/89 | HR 95 | Temp 98.6°F | Ht 64.0 in | Wt 155.0 lb

## 2023-08-18 DIAGNOSIS — H7291 Unspecified perforation of tympanic membrane, right ear: Secondary | ICD-10-CM | POA: Diagnosis not present

## 2023-08-18 DIAGNOSIS — H6591 Unspecified nonsuppurative otitis media, right ear: Secondary | ICD-10-CM | POA: Insufficient documentation

## 2023-08-18 DIAGNOSIS — R42 Dizziness and giddiness: Secondary | ICD-10-CM | POA: Insufficient documentation

## 2023-08-18 MED ORDER — ONDANSETRON 4 MG PO TBDP
4.0000 mg | ORAL_TABLET | Freq: Three times a day (TID) | ORAL | 0 refills | Status: AC | PRN
Start: 2023-08-18 — End: ?

## 2023-08-18 MED ORDER — SCOPOLAMINE 1 MG/3DAYS TD PT72
1.0000 | MEDICATED_PATCH | TRANSDERMAL | 0 refills | Status: DC
Start: 2023-08-18 — End: 2023-08-28

## 2023-08-18 NOTE — Progress Notes (Signed)
 Subjective:    Patient ID: Brianna Griffin, female    DOB: 03/31/1986, 38 y.o.   MRN: [id number]  HPI Presents for complaints of right ear pressure with dizziness and nausea that began after a right ear infection on 08/05/2023.  Completed her antibiotics.  Continues to have a sensation of her right ear being clogged.  No pain.  Off-and-on nausea and dizziness, no vomiting.  No specific triggers.  Occasional headache usually mild.  1 very brief intense headache earlier after doing some stretching.  No numbness or weakness of the face arms or legs.  No difficulty speaking or swallowing.  No syncopal episodes.  States her vision in the right eye seems to be "slightly off" at times but no loss of vision.  No drainage.  No fever.   Review of Systems  Constitutional:  Negative for fever.  HENT:  Positive for congestion. Negative for ear pain, sinus pressure, sinus pain and sore throat.   Respiratory:  Negative for cough, chest tightness, shortness of breath and wheezing.   Neurological:  Positive for dizziness, light-headedness and headaches. Negative for syncope, facial asymmetry, speech difficulty, weakness and numbness.       Denies significant headaches.       Objective:   Physical Exam NAD.  Alert, oriented.  Left TM clear effusion with air bubbles noted, no erythema.  Right TM the center of the TM is slightly retracted with a circular moderately erythematous area but appears to be a healed TM rupture.  Tiny amount of crusted blood noted along the edges.  Surrounding this is clear effusion particularly around 4:00.  No active drainage noted.  Pharynx clear and moist.  Neck supple with mild soft anterior cervical adenopathy.  Lungs clear.  Heart regular rate rhythm.  EOMs intact without nystagmus.  Reflexes normal limit upper and lower extremities.  Speech clear.  Face symmetrical.  Romberg negative. Today's Vitals   08/18/23 1113  BP: 128/89  Pulse: 95  Temp: 98.6 F (37 C)  SpO2: 99%   Weight: 155 lb (70.3 kg)  Height: 5\' 4"  (1.626 m)   Body mass index is 26.61 kg/m.       Assessment & Plan:   Problem List Items Addressed This Visit       Nervous and Auditory   Otitis media, serous, tm rupture, right - Primary   Relevant Orders   Ambulatory referral to ENT     Other   Vertigo   Relevant Orders   Ambulatory referral to ENT   Meds ordered this encounter  Medications   scopolamine (TRANSDERM SCOP, 1.5 MG,) 1 MG/3DAYS    Sig: Place 1 patch (1.5 mg total) onto the skin every 3 (three) days.    Dispense:  10 patch    Refill:  0    Supervising Provider:   Lilyan Punt A [[id number]]   ondansetron (ZOFRAN-ODT) 4 MG disintegrating tablet    Sig: Dissolve 1 tablet (4 mg total) by mouth every 8 (eight) hours as needed for nausea.    Dispense:  30 tablet    Refill:  0    Supervising Provider:   Lilyan Punt A [[id number]]   Referred to ENT specialist for evaluation.  Warning signs reviewed.  Patient to call back in the meantime if any new or worsening symptoms. Since she is having frequent dizziness, trial of scopolamine patches as directed.  Discontinue if any adverse effects.  Also refill on her Zofran that she takes for nausea. Return if symptoms  worsen or fail to improve.

## 2023-08-21 ENCOUNTER — Other Ambulatory Visit (HOSPITAL_COMMUNITY): Payer: Self-pay

## 2023-08-22 ENCOUNTER — Other Ambulatory Visit: Payer: Self-pay | Admitting: Family Medicine

## 2023-08-22 ENCOUNTER — Encounter: Payer: Self-pay | Admitting: Nurse Practitioner

## 2023-08-22 MED ORDER — MECLIZINE HCL 25 MG PO TABS: 25.0000 mg | ORAL_TABLET | Freq: Three times a day (TID) | ORAL | 0 refills | Status: DC | PRN

## 2023-08-23 ENCOUNTER — Emergency Department (HOSPITAL_COMMUNITY)

## 2023-08-23 ENCOUNTER — Inpatient Hospital Stay (HOSPITAL_COMMUNITY)
Admission: EM | Admit: 2023-08-23 | Discharge: 2023-08-27 | DRG: 041 | Disposition: A | Discharge status: Home / Self Care | Attending: Family Medicine | Admitting: Family Medicine

## 2023-08-23 ENCOUNTER — Other Ambulatory Visit: Payer: Self-pay

## 2023-08-23 ENCOUNTER — Encounter (HOSPITAL_COMMUNITY): Payer: Self-pay

## 2023-08-23 DIAGNOSIS — G8191 Hemiplegia, unspecified affecting right dominant side: Secondary | ICD-10-CM | POA: Diagnosis not present

## 2023-08-23 DIAGNOSIS — G43009 Migraine without aura, not intractable, without status migrainosus: Secondary | ICD-10-CM | POA: Diagnosis present

## 2023-08-23 DIAGNOSIS — I80291 Phlebitis and thrombophlebitis of other deep vessels of right lower extremity: Secondary | ICD-10-CM | POA: Diagnosis not present

## 2023-08-23 DIAGNOSIS — E785 Hyperlipidemia, unspecified: Secondary | ICD-10-CM | POA: Diagnosis present

## 2023-08-23 DIAGNOSIS — I771 Stricture of artery: Secondary | ICD-10-CM | POA: Diagnosis not present

## 2023-08-23 DIAGNOSIS — R29701 NIHSS score 1: Secondary | ICD-10-CM | POA: Diagnosis not present

## 2023-08-23 DIAGNOSIS — I6381 Other cerebral infarction due to occlusion or stenosis of small artery: Principal | ICD-10-CM | POA: Diagnosis present

## 2023-08-23 DIAGNOSIS — K219 Gastro-esophageal reflux disease without esophagitis: Secondary | ICD-10-CM | POA: Diagnosis present

## 2023-08-23 DIAGNOSIS — I639 Cerebral infarction, unspecified: Secondary | ICD-10-CM | POA: Diagnosis not present

## 2023-08-23 DIAGNOSIS — F32A Depression, unspecified: Secondary | ICD-10-CM | POA: Diagnosis present

## 2023-08-23 DIAGNOSIS — Z7951 Long term (current) use of inhaled steroids: Secondary | ICD-10-CM

## 2023-08-23 DIAGNOSIS — Z820 Family history of epilepsy and other diseases of the nervous system: Secondary | ICD-10-CM

## 2023-08-23 DIAGNOSIS — Z8249 Family history of ischemic heart disease and other diseases of the circulatory system: Secondary | ICD-10-CM | POA: Diagnosis not present

## 2023-08-23 DIAGNOSIS — I340 Nonrheumatic mitral (valve) insufficiency: Secondary | ICD-10-CM | POA: Diagnosis not present

## 2023-08-23 DIAGNOSIS — F419 Anxiety disorder, unspecified: Secondary | ICD-10-CM | POA: Diagnosis present

## 2023-08-23 DIAGNOSIS — I10 Essential (primary) hypertension: Secondary | ICD-10-CM | POA: Diagnosis present

## 2023-08-23 DIAGNOSIS — R200 Anesthesia of skin: Secondary | ICD-10-CM | POA: Diagnosis not present

## 2023-08-23 DIAGNOSIS — Z800 Family history of malignant neoplasm of digestive organs: Secondary | ICD-10-CM

## 2023-08-23 DIAGNOSIS — Z7902 Long term (current) use of antithrombotics/antiplatelets: Secondary | ICD-10-CM

## 2023-08-23 DIAGNOSIS — Z79899 Other long term (current) drug therapy: Secondary | ICD-10-CM | POA: Diagnosis not present

## 2023-08-23 DIAGNOSIS — Z7982 Long term (current) use of aspirin: Secondary | ICD-10-CM

## 2023-08-23 DIAGNOSIS — Z886 Allergy status to analgesic agent status: Secondary | ICD-10-CM

## 2023-08-23 DIAGNOSIS — R29700 NIHSS score 0: Secondary | ICD-10-CM | POA: Diagnosis present

## 2023-08-23 DIAGNOSIS — Z833 Family history of diabetes mellitus: Secondary | ICD-10-CM

## 2023-08-23 DIAGNOSIS — G9389 Other specified disorders of brain: Secondary | ICD-10-CM

## 2023-08-23 DIAGNOSIS — F418 Other specified anxiety disorders: Secondary | ICD-10-CM | POA: Diagnosis not present

## 2023-08-23 DIAGNOSIS — E039 Hypothyroidism, unspecified: Secondary | ICD-10-CM | POA: Diagnosis present

## 2023-08-23 DIAGNOSIS — R519 Headache, unspecified: Secondary | ICD-10-CM | POA: Diagnosis not present

## 2023-08-23 DIAGNOSIS — D329 Benign neoplasm of meninges, unspecified: Secondary | ICD-10-CM | POA: Diagnosis not present

## 2023-08-23 DIAGNOSIS — R9089 Other abnormal findings on diagnostic imaging of central nervous system: Secondary | ICD-10-CM | POA: Diagnosis not present

## 2023-08-23 DIAGNOSIS — K589 Irritable bowel syndrome without diarrhea: Secondary | ICD-10-CM | POA: Diagnosis present

## 2023-08-23 DIAGNOSIS — Z8052 Family history of malignant neoplasm of bladder: Secondary | ICD-10-CM | POA: Diagnosis not present

## 2023-08-23 DIAGNOSIS — I4892 Unspecified atrial flutter: Secondary | ICD-10-CM | POA: Diagnosis present

## 2023-08-23 DIAGNOSIS — J45909 Unspecified asthma, uncomplicated: Secondary | ICD-10-CM | POA: Diagnosis present

## 2023-08-23 DIAGNOSIS — Z818 Family history of other mental and behavioral disorders: Secondary | ICD-10-CM

## 2023-08-23 DIAGNOSIS — G4486 Cervicogenic headache: Secondary | ICD-10-CM | POA: Diagnosis not present

## 2023-08-23 DIAGNOSIS — R202 Paresthesia of skin: Secondary | ICD-10-CM | POA: Diagnosis present

## 2023-08-23 DIAGNOSIS — I6389 Other cerebral infarction: Secondary | ICD-10-CM | POA: Diagnosis not present

## 2023-08-23 DIAGNOSIS — I4719 Other supraventricular tachycardia: Secondary | ICD-10-CM | POA: Diagnosis not present

## 2023-08-23 LAB — CBC WITH DIFFERENTIAL/PLATELET
Abs Immature Granulocytes: 0.03 10*3/uL (ref 0.00–0.07)
Basophils Absolute: 0 10*3/uL (ref 0.0–0.1)
Basophils Relative: 0 %
Eosinophils Absolute: 0.1 10*3/uL (ref 0.0–0.5)
Eosinophils Relative: 1 %
HCT: 40.3 % (ref 36.0–46.0)
Hemoglobin: 13.4 g/dL (ref 12.0–15.0)
Immature Granulocytes: 0 %
Lymphocytes Relative: 20 %
Lymphs Abs: 1.4 10*3/uL (ref 0.7–4.0)
MCH: 29.3 pg (ref 26.0–34.0)
MCHC: 33.3 g/dL (ref 30.0–36.0)
MCV: 88.2 fL (ref 80.0–100.0)
Monocytes Absolute: 0.3 10*3/uL (ref 0.1–1.0)
Monocytes Relative: 4 %
Neutro Abs: 5.1 10*3/uL (ref 1.7–7.7)
Neutrophils Relative %: 75 %
Platelets: 204 10*3/uL (ref 150–400)
RBC: 4.57 MIL/uL (ref 3.87–5.11)
RDW: 13.4 % (ref 11.5–15.5)
WBC: 6.9 10*3/uL (ref 4.0–10.5)
nRBC: 0 % (ref 0.0–0.2)

## 2023-08-23 LAB — SEDIMENTATION RATE: Sed Rate: 3 mm/h (ref 0–22)

## 2023-08-23 LAB — BASIC METABOLIC PANEL WITH GFR
Anion gap: 9 (ref 5–15)
BUN: 9 mg/dL (ref 6–20)
CO2: 24 mmol/L (ref 22–32)
Calcium: 9.4 mg/dL (ref 8.9–10.3)
Chloride: 103 mmol/L (ref 98–111)
Creatinine, Ser: 0.75 mg/dL (ref 0.44–1.00)
GFR, Estimated: >60 mL/min (ref >60)
Glucose, Bld: 96 mg/dL (ref 70–99)
Potassium: 4 mmol/L (ref 3.5–5.1)
Sodium: 136 mmol/L (ref 135–145)

## 2023-08-23 LAB — POC URINE PREG, ED: Preg Test, Ur: NEGATIVE

## 2023-08-23 MED ORDER — IOHEXOL 350 MG/ML SOLN
75.0000 mL | Freq: Once | INTRAVENOUS | Status: AC | PRN
  Administered 2023-08-23: 75 mL via INTRAVENOUS

## 2023-08-23 MED ORDER — GADOBUTROL 1 MMOL/ML IV SOLN
7.0000 mL | Freq: Once | INTRAVENOUS | Status: AC | PRN
  Administered 2023-08-23: 7 mL via INTRAVENOUS

## 2023-08-23 NOTE — ED Provider Notes (Signed)
 [place] EMERGENCY DEPARTMENT AT [facility] Provider Note   CSN: [id number] Arrival date & time: 08/23/23  0915     History  Chief Complaint  Patient presents with   Numbness    Brianna Griffin is a 38 y.o. female.  HPI      Brianna Griffin is a 38 y.o. female past medical history of anxiety, migraine headaches, GERD, IBS, asthma who presents to the Emergency Department complaining of right sided numbness.  3 to 4 weeks ago, patient endorsed having pain of the left side of her neck was seen in urgent care and received steroid injection with improvement of her neck pain.  Several days later, she developed pain of her right ear.  She was again seen and diagnosed with ear infection, she completed course of amoxicillin with improvement of her ear pain.  Yesterday, she woke in the early morning hours having numbness and tingling of her right arm.  Symptoms lasted approximately 45 minutes spontaneously resolved.  She again woke this morning having similar symptoms but now up to the entire right side of her face arm and leg.  States that the time the symptoms were present she was having difficulty standing and walking.  Upon arrival to the ER her symptoms have resolved.  She describes as sensation of her right side is "muted."  She has been also been having intermittent headaches and increased stress recently.  She denies having any visual changes, dizziness, nausea or vomiting.  No recent injuries.   Home Medications Prior to Admission medications   Medication Sig Start Date End Date Taking? Authorizing Provider  meclizine (ANTIVERT) 25 MG tablet Take 1 tablet (25 mg total) by mouth 3 (three) times daily as needed for dizziness. 08/22/23   Tommie Sams, DO  acetaminophen (TYLENOL) 325 MG tablet Take 2 tablets (650 mg total) by mouth every 4 (four) hours as needed (for pain scale < 4). 12/07/20   Arabella Merles, CNM  albuterol (VENTOLIN HFA) 108 (90 Base) MCG/ACT inhaler Inhale 2  puffs into the lungs every 6 (six) hours as needed for wheezing or shortness of breath. 04/03/23   Tommie Sams, DO  amoxicillin (AMOXIL) 875 MG tablet Take 1 tablet (875 mg total) by mouth 2 (two) times daily. 08/05/23   Particia Nearing, PA-C  buPROPion (WELLBUTRIN XL) 300 MG 24 hr tablet Take 1 tablet (300 mg total) by mouth at bedtime. 07/31/22   Mozingo, Thereasa Solo, NP  Calcium Carbonate Antacid (TUMS PO) Take by mouth.    [provider]  famotidine (PEPCID) 20 MG tablet Take 1 tablet (20 mg total) by mouth 2 (two) times daily as needed for acid reflux 03/20/23   Campbell Riches, NP  fluticasone (FLONASE) 50 MCG/ACT nasal spray Place 1 spray into both nostrils 2 (two) times daily. 08/05/23   Particia Nearing, PA-C  fluticasone-salmeterol (ADVAIR) 100-50 MCG/ACT AEPB Inhale 1 puff into the lungs 2 (two) times daily. 03/20/23   Campbell Riches, NP  hydrOXYzine (ATARAX) 25 MG tablet Take 1 tablet (25 mg total) by mouth every 6 (six) hours as needed. 07/31/22   Mozingo, Thereasa Solo, NP  ipratropium (ATROVENT) 0.03 % nasal spray Place 2 sprays into both nostrils every 12 (twelve) hours. 08/05/23   Waldon Merl, PA-C  methocarbamol (ROBAXIN) 500 MG tablet Take 1 tablet (500 mg total) by mouth 2 (two) times daily. 08/03/23   Leath-Warren, Sadie Haber, NP  ondansetron (ZOFRAN-ODT) 4 MG disintegrating tablet Dissolve  1 tablet (4 mg total) by mouth every 8 (eight) hours as needed for nausea. 08/18/23   Campbell Riches, NP  pseudoephedrine (SUDAFED) 60 MG tablet Take 1 tablet (60 mg total) by mouth every 8 (eight) hours as needed for congestion. 08/05/23   Particia Nearing, PA-C  scopolamine (TRANSDERM SCOP, 1.5 MG,) 1 MG/3DAYS Place 1 patch (1.5 mg total) onto the skin every 3 (three) days. 08/18/23   Campbell Riches, NP  sertraline (ZOLOFT) 100 MG tablet Take 1 tablet (100 mg total) by mouth daily. 09/12/22   Mozingo, Thereasa Solo, NP      Allergies     Ibuprofen    Review of Systems   Review of Systems  Constitutional:  Negative for appetite change, chills and fever.  Respiratory:  Negative for shortness of breath.   Cardiovascular:  Negative for chest pain and leg swelling.  Gastrointestinal:  Negative for abdominal pain, nausea and vomiting.  Genitourinary:  Negative for dysuria.  Musculoskeletal:  Negative for neck pain and neck stiffness.  Skin:  Negative for rash.  Neurological:  Positive for numbness and headaches. Negative for dizziness, syncope, facial asymmetry and speech difficulty.    Physical Exam Updated Vital Signs BP (!) 132/95 (BP Location: Left Arm)   Pulse 93   Temp 99.3 F (37.4 C) (Oral)   Resp 18   Ht 5\' 4"  (1.626 m)   Wt 70.3 kg   LMP  (LMP Unknown)   SpO2 100%   BMI 26.61 kg/m  Physical Exam Vitals and nursing note reviewed.  Constitutional:      General: She is not in acute distress.    Appearance: Normal appearance. She is not ill-appearing or toxic-appearing.  HENT:     Right Ear: Tympanic membrane and ear canal normal.     Left Ear: Tympanic membrane and ear canal normal.  Eyes:     Extraocular Movements: Extraocular movements intact.     Conjunctiva/sclera: Conjunctivae normal.     Pupils: Pupils are equal, round, and reactive to light.  Cardiovascular:     Rate and Rhythm: Normal rate and regular rhythm.     Pulses: Normal pulses.  Pulmonary:     Effort: Pulmonary effort is normal.  Chest:     Chest wall: No tenderness.  Abdominal:     Palpations: Abdomen is soft.     Tenderness: There is no abdominal tenderness.  Musculoskeletal:        General: Normal range of motion.     Cervical back: Normal range of motion. No rigidity or tenderness.     Right lower leg: No edema.     Left lower leg: No edema.  Skin:    General: Skin is warm.     Capillary Refill: Capillary refill takes less than 2 seconds.     Findings: No rash.  Neurological:     General: No focal deficit present.      Mental Status: She is alert.     GCS: GCS eye subscore is 4. GCS verbal subscore is 5. GCS motor subscore is 6.     Sensory: Sensation is intact. No sensory deficit.     Motor: Motor function is intact. No weakness.     Coordination: Coordination is intact. Coordination normal. Finger-Nose-Finger Test and Heel to [facility] Test normal.     Comments: CN II through XII intact.  Speech clear.  No pronator drift.  No facial droop    ED Results / Procedures / Treatments  Labs (all labs ordered are listed, but only abnormal results are displayed) Labs Reviewed  CBC WITH DIFFERENTIAL/PLATELET  BASIC METABOLIC PANEL WITH GFR  POC URINE PREG, ED    EKG EKG Interpretation Date/Time:  Saturday August 23 2023 09:29:04 EDT Ventricular Rate:  94 PR Interval:  116 QRS Duration:  86 QT Interval:  353 QTC Calculation: 442 R Axis:   89  Text Interpretation: Sinus rhythm Borderline short PR interval Normal ECG No old tracing to compare Confirmed by Eber Hong ([id number]) on 08/23/2023 9:34:26 AM  Radiology MR Brain W and Wo Contrast Result Date: 08/23/2023 CLINICAL DATA:  Headache.  Intermittent right-sided numbness. EXAM: MRI HEAD WITHOUT AND WITH CONTRAST TECHNIQUE: Multiplanar, multiecho pulse sequences of the brain and surrounding structures were obtained without and with intravenous contrast. CONTRAST:  7mL GADAVIST GADOBUTROL 1 MMOL/ML IV SOLN COMPARISON:  CT angio head and neck 08/23/2023 FINDINGS: Brain: A 5 mm acute nonhemorrhagic infarct is present in the lateral left thalamus. T2 and FLAIR hyperintensity is associated. No other significant scratched at no significant white matter disease is present. Deep brain nuclei are within normal limits. The ventricles are of normal size. No significant extraaxial fluid collection is present. A dural based enhancing mass lesion at the right CP angle measures 14 x 12 x 15 mm. This sits along the anterior aspect of the porous acoustic [place] but does not enter the IAC.  The seventh and eighth cranial nerves are separate from the lesion. The left ICA is within normal limits. Vascular: Flow is present in the major intracranial arteries. Skull and upper cervical spine: The craniocervical junction is normal. Upper cervical spine is within normal limits. Marrow signal is unremarkable. Sinuses/Orbits: A right mastoid effusion is present. No obstructing nasopharyngeal lesion is present. The paranasal sinuses and mastoid air cells are otherwise clear. The globes and orbits are within normal limits. IMPRESSION: 1. 5 mm acute nonhemorrhagic infarct of the lateral left thalamus. 2. 14 x 12 x 15 mm dural based enhancing mass lesion at the right CP angle consistent with a meningioma. 3. Right mastoid effusion. No obstructing nasopharyngeal lesion is present. These results were called by telephone at the time of interpretation on 08/23/2023 at 4:08 pm to provider [address] [employer] Brach Birdsall , who verbally acknowledged these results. Electronically Signed   By: Marin Roberts M.D.   On: 08/23/2023 16:17   CT ANGIO HEAD NECK W WO CM Result Date: 08/23/2023 CLINICAL DATA:  Headache, sudden, severe. Intermittent right-sided numbness. Left-sided neck pain. EXAM: CT ANGIOGRAPHY HEAD AND NECK WITH AND WITHOUT CONTRAST TECHNIQUE: Multidetector CT imaging of the head and neck was performed using the standard protocol during bolus administration of intravenous contrast. Multiplanar CT image reconstructions and MIPs were obtained to evaluate the vascular anatomy. Carotid stenosis measurements (when applicable) are obtained utilizing NASCET criteria, using the distal internal carotid diameter as the denominator. RADIATION DOSE REDUCTION: This exam was performed according to the departmental dose-optimization program which includes automated exposure control, adjustment of the mA and/or kV according to patient size and/or use of iterative reconstruction technique. CONTRAST:  75mL OMNIPAQUE IOHEXOL 350 MG/ML SOLN  COMPARISON:  None Available. FINDINGS: CT HEAD FINDINGS Brain: No acute infarct, hemorrhage, or mass lesion is present. No significant white matter lesions are present. The ventricles are of normal size. Deep brain nuclei are within normal limits. No significant extraaxial fluid collection is present. The brainstem and cerebellum are within normal limits. Midline structures are within normal limits. Vascular: No hyperdense vessel or unexpected calcification.  Skull: Calvarium is intact. No focal lytic or blastic lesions are present. No significant extracranial soft tissue lesion is present. Sinuses/Orbits: The paranasal sinuses and mastoid air cells are clear. The globes and orbits are within normal limits. Other: Postcontrast images demonstrate an enhancing dural-based mass lesion measuring 12 x 10 x 13 mm at the porous acusticus and extending inferiorly. The lesion is not extend into the IAC. Review of the MIP images confirms the above findings CTA NECK FINDINGS Aortic arch: A 3 vessel arch configuration is present. No significant vascular disease or stenosis is present. Right carotid system: The right common carotid artery is within normal limits. Bifurcation is unremarkable. Cervical right ICA is normal. Left carotid system: The left common carotid artery is within normal limits. Bifurcation is unremarkable. The cervical left ICA demonstrates mild tortuosity just below the skull base. Vertebral arteries: The vertebral arteries are codominant. Both vertebral arteries originate from the subclavian arteries. No significant stenosis is present in either vertebral artery in the neck. Skeleton: Vertebral body heights alignment are normal. No significant listhesis is present. Mild straightening of the normal cervical lordosis is present. No focal osseous lesions are present. Other neck: The soft tissues of the neck are otherwise unremarkable. Salivary glands are within normal limits. Thyroid is normal. No significant  adenopathy is present. No focal mucosal or submucosal lesions are present. Upper chest: The lung apices are clear. The thoracic inlet is within normal limits. Review of the MIP images confirms the above findings CTA HEAD FINDINGS Anterior circulation: The internal carotid arteries are within normal limits the skull base to the ICA termini. The A1 and M1 segments are normal. The right A1 segment is aplastic. Both A2 segments fill from the left. M1 segments are normal bilaterally. The MCA bifurcations are within normal limits. The ACA and MCA branch vessels are normal bilaterally. No aneurysm is present. Posterior circulation: PICA origins are visualized and within normal limits. The left vertebral artery is centrally terminates at the PICA extending only very small distal V4 segment. The vertebrobasilar junction is normal. The basilar artery is hypoplastic. The left vertebral artery arises from the basilar tip. Moderate proximal left P2 segment stenosis is present. The right posterior cerebral artery is of fetal type. The right posterior cerebral artery is of fetal type. Superior division left P3 segments are not well seen. Venous sinuses: The dural sinuses are patent. The straight sinus and deep cerebral veins are intact. Cortical veins are within normal limits. No significant vascular malformation is evident. Anatomic variants: Fetal type right posterior cerebral artery. Aplastic right A1 segment. Review of the MIP images confirms the above findings IMPRESSION: 1. 12 x 10 x 13 mm enhancing dural-based mass lesion at the porous acusticus and extending inferiorly. This most likely represents meningioma. A vestibular schwannoma is also considered. 2. Otherwise normal CT appearance of the brain. 3. No significant stenosis, aneurysm, or branch vessel occlusion within the anterior circulation. 4. Moderate proximal left P2 segment stenosis. 5. Superior division left P3 segments are not well seen. This may be related to  stenosis or occlusion. 6. Normal CTA of the neck. Recommend MR head without and with contrast for further evaluation. These results were called by telephone at the time of interpretation on 08/23/2023 at 12:34 pm to provider [facility] Aarush Stukey , who verbally acknowledged these results. Electronically Signed   By: Marin Roberts M.D.   On: 08/23/2023 12:34    Procedures Procedures    Medications Ordered in ED Medications - No data  to display  ED Course/ Medical Decision Making/ A&P                                 Medical Decision Making Patient here for evaluation of 2 separate episodes of unilateral paresthesia.  Episode yesterday involving the right arm that spontaneously resolved.  Similar episode occurred this morning prior to arrival and involved entire right side.  Symptoms resolved prior to ER evaluation.  No history of neck pain or trauma.  Also endorses treatment for left-sided neck pain as well as right ear pain that was treated with antibiotics approximately 1 month ago  Cervical radiculopathy, TIA/CVA, vertebral artery stenosis, mass all considered.  Amount and/or Complexity of Data Reviewed Labs: ordered.    Details: Labs unremarkable urine pregnancy test negative Radiology: ordered.    Details: CTA head neck ordered, shows 12 x 10 x 13 mm dural based mass lesion likely meningioma.  Discussed findings with radiologist.  He recommends MRI brain with without contrast for further evaluation   ECG/medicine tests: ordered.    Details: EKG shows sinus rhythm borderline short PR interval Discussion of management or test interpretation with external provider(s):  Patient here with 5 mm infarct left lateral thalamus.  Meningioma likely incidental finding.  Awaiting callback from neurology.   Discussed with neurology, Dr. Otelia Limes recommends full stroke workup including hypercoagulable panel for CNS/vasculitis along with recommending LP for inflammatory markers.  Patient will need  transfer to [facility].    Discussed with Dr. Denton Lank who agrees to consult hospitalist for patient admission     Risk Prescription drug management.           Final Clinical Impression(s) / ED Diagnoses Final diagnoses:  Focal infarction of brain [facility])    Rx / DC Orders ED Discharge Orders     None         Pauline Aus, PA-C 08/23/23 1813    Eber Hong, MD 08/24/23 [phone]

## 2023-08-23 NOTE — ED Triage Notes (Signed)
 Pt c/o rt side numbness from head to toe. Pt states this began yesterday morning eventually went away and this morning same sensation that has taken longer to go away. Pt states over a month now having intermittent headache and having left neck pain that a steroid shot was given. Pt states going to bed Thursday night around 2300.

## 2023-08-23 NOTE — Progress Notes (Signed)
 Incidental finding of a right CP angle meningioma. This is not likely causing her right sided weakness/tingling, that is probably from the left sided infarct in the left thalamus. Can follow up as an outpatient for meningioma.

## 2023-08-23 NOTE — H&P (Signed)
 History and Physical    Patient: Brianna Griffin AOZ:[id number] DOB: 06/30/1985 DOA: 08/23/2023 DOS: the patient was seen and examined on 08/23/2023 PCP: Tommie Sams, DO  Patient coming from: Home  Chief Complaint:  Chief Complaint  Patient presents with   Numbness   HPI: Brianna Griffin is a 38 y.o. female with medical history significant of anxiety/depression, GERD, IBS.  Patient brought to the hospital with right sided numbness.  She had a little bit of the numbness when she woke up yesterday morning but seem to go away.  She had much more numbness when she woke up this morning worse in her upper arm, although she could not walk and needed assistance ambulating to the bathroom into the car.  Her husband brought her here for evaluation.  She has been having increased headache for a month and was treated for a pulled neck muscle and an ear infection.   She denies fevers, chills, chest pain, shortness of breath.  She is not currently on birth control -she had been on Depo-Provera, but decided to transition off hormonal birth control.  Her last injection was on 05/23/2023.  No family history of clotting disorders that she is aware of.  No leg pain or swelling.  Review of Systems: As mentioned in the history of present illness. All other systems reviewed and are negative. 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 She has never used smokeless tobacco. She reports current alcohol use. She reports that she does not use  drugs.  Allergies  Allergen Reactions   Ibuprofen Hives    Family History  Problem Relation Age of Onset   Diabetes Maternal Grandmother    Hypertension Maternal Grandmother    Cancer Maternal Grandfather        bladder and liver   Crohn's disease Maternal Grandfather    Diabetes Father    Hypertension Father    Post-traumatic stress disorder Father    ADD / ADHD Brother    Depression Brother    ADD / ADHD Sister    Depression Sister    ADD / ADHD Daughter    Epilepsy Daughter    Epilepsy Son    ADD / ADHD Son    Autism Son     Prior to Admission medications   Medication Sig Start Date End Date Taking? Authorizing Provider  meclizine (ANTIVERT) 25 MG tablet Take 1 tablet (25 mg total) by mouth 3 (three) times daily as needed for dizziness. 08/22/23   Tommie Sams, DO  acetaminophen (TYLENOL) 325 MG tablet Take 2 tablets (650 mg total) by mouth every 4 (four) hours as needed (for pain scale < 4). 12/07/20   Arabella Merles, CNM  albuterol (VENTOLIN HFA) 108 (90 Base) MCG/ACT inhaler Inhale 2 puffs into the lungs every 6 (six) hours as needed for wheezing or shortness of breath. 04/03/23   Tommie Sams, DO  amoxicillin (AMOXIL)  875 MG tablet Take 1 tablet (875 mg total) by mouth 2 (two) times daily. 08/05/23   Particia Nearing, PA-C  buPROPion (WELLBUTRIN XL) 300 MG 24 hr tablet Take 1 tablet (300 mg total) by mouth at bedtime. 07/31/22   Mozingo, Thereasa Solo, NP  Calcium Carbonate Antacid (TUMS PO) Take by mouth.    [provider]  famotidine (PEPCID) 20 MG tablet Take 1 tablet (20 mg total) by mouth 2 (two) times daily as needed for acid reflux 03/20/23   Campbell Riches, NP  fluticasone (FLONASE) 50 MCG/ACT nasal spray Place 1 spray into both nostrils 2 (two) times daily. 08/05/23   Particia Nearing, PA-C  fluticasone-salmeterol (ADVAIR) 100-50 MCG/ACT AEPB Inhale 1 puff into the lungs 2 (two) times daily. 03/20/23   Campbell Riches, NP   hydrOXYzine (ATARAX) 25 MG tablet Take 1 tablet (25 mg total) by mouth every 6 (six) hours as needed. 07/31/22   Mozingo, Thereasa Solo, NP  ipratropium (ATROVENT) 0.03 % nasal spray Place 2 sprays into both nostrils every 12 (twelve) hours. 08/05/23   Waldon Merl, PA-C  methocarbamol (ROBAXIN) 500 MG tablet Take 1 tablet (500 mg total) by mouth 2 (two) times daily. 08/03/23   Leath-Warren, Sadie Haber, NP  ondansetron (ZOFRAN-ODT) 4 MG disintegrating tablet Dissolve 1 tablet (4 mg total) by mouth every 8 (eight) hours as needed for nausea. 08/18/23   Campbell Riches, NP  PHEXXI 1.8-1-0.4 % GEL Place 1 Application vaginally daily as needed (contraceptive). Spermicidal 07/25/23   [provider]  pseudoephedrine (SUDAFED) 60 MG tablet Take 1 tablet (60 mg total) by mouth every 8 (eight) hours as needed for congestion. 08/05/23   Particia Nearing, PA-C  scopolamine (TRANSDERM SCOP, 1.5 MG,) 1 MG/3DAYS Place 1 patch (1.5 mg total) onto the skin every 3 (three) days. 08/18/23   Campbell Riches, NP  sertraline (ZOLOFT) 100 MG tablet Take 1 tablet (100 mg total) by mouth daily. 09/12/22   Mozingo, Thereasa Solo, NP    Physical Exam: Vitals:   08/23/23 0932 08/23/23 1632 08/23/23 1700 08/23/23 1715  BP:  (!) 126/95 (!) 122/98 112/87  Pulse:  96 100 94  Resp:  (!) 22 (!) 29 17  Temp:      TempSrc:      SpO2:  99% 96% 97%  Weight: 70.3 kg     Height: 5\' 4"  (1.626 m)      General: Young female. Awake and alert and oriented x3. No acute cardiopulmonary distress.  HEENT: Normocephalic atraumatic.  Right and left ears normal in appearance.  Pupils equal, round, reactive to light. Extraocular muscles are intact. Sclerae anicteric and noninjected.  Moist mucosal membranes. No mucosal lesions.  Neck: Neck supple without lymphadenopathy. No carotid bruits. No masses palpated.  Cardiovascular: Regular rate with normal S1-S2 sounds. No murmurs, rubs, gallops auscultated. No JVD.   Respiratory: Good respiratory effort with no wheezes, rales, rhonchi. Lungs clear to auscultation bilaterally.  No accessory muscle use. Abdomen: Soft, nontender, nondistended. Active bowel sounds. No masses or hepatosplenomegaly  Skin: No rashes, lesions, or ulcerations.  Dry, warm to touch. 2+ dorsalis pedis and radial pulses. Musculoskeletal: No calf or leg pain. All major joints not erythematous nontender.  No upper or lower joint deformation.  Good ROM.  No contractures  Psychiatric: Intact judgment and insight. Pleasant and cooperative. Neurologic: some mild numbness in the RUE, but otherwise no focal neurological deficits. Strength is 5/5 and symmetric in upper and lower  extremities.  Cranial nerves II through XII are grossly intact.  Data Reviewed: Results for orders placed or performed during the hospital encounter of 08/23/23 (from the past 24 hours)  CBC with Differential     Status: None   Collection Time: 08/23/23 10:51 AM  Result Value Ref Range   WBC 6.9 4.0 - 10.5 K/uL   RBC 4.57 3.87 - 5.11 MIL/uL   Hemoglobin 13.4 12.0 - 15.0 g/dL   HCT 91.4 78.2 - 95.6 %   MCV 88.2 80.0 - 100.0 fL   MCH 29.3 26.0 - 34.0 pg   MCHC 33.3 30.0 - 36.0 g/dL   RDW 21.3 08.6 - 57.8 %   Platelets 204 150 - 400 K/uL   nRBC 0.0 0.0 - 0.2 %   Neutrophils Relative % 75 %   Neutro Abs 5.1 1.7 - 7.7 K/uL   Lymphocytes Relative 20 %   Lymphs Abs 1.4 0.7 - 4.0 K/uL   Monocytes Relative 4 %   Monocytes Absolute 0.3 0.1 - 1.0 K/uL   Eosinophils Relative 1 %   Eosinophils Absolute 0.1 0.0 - 0.5 K/uL   Basophils Relative 0 %   Basophils Absolute 0.0 0.0 - 0.1 K/uL   Immature Granulocytes 0 %   Abs Immature Granulocytes 0.03 0.00 - 0.07 K/uL  Basic metabolic panel     Status: None   Collection Time: 08/23/23 10:51 AM  Result Value Ref Range   Sodium 136 135 - 145 mmol/L   Potassium 4.0 3.5 - 5.1 mmol/L   Chloride 103 98 - 111 mmol/L   CO2 24 22 - 32 mmol/L   Glucose, Bld 96 70 - 99 mg/dL    BUN 9 6 - 20 mg/dL   Creatinine, Ser 4.69 0.44 - 1.00 mg/dL   Calcium 9.4 8.9 - 62.9 mg/dL   GFR, Estimated >52 >84 mL/min   Anion gap 9 5 - 15  POC urine preg, ED     Status: None   Collection Time: 08/23/23  2:22 PM  Result Value Ref Range   Preg Test, Ur NEGATIVE NEGATIVE    MR Brain W and Wo Contrast Result Date: 08/23/2023 CLINICAL DATA:  Headache.  Intermittent right-sided numbness. EXAM: MRI HEAD WITHOUT AND WITH CONTRAST TECHNIQUE: Multiplanar, multiecho pulse sequences of the brain and surrounding structures were obtained without and with intravenous contrast. CONTRAST:  7mL GADAVIST GADOBUTROL 1 MMOL/ML IV SOLN COMPARISON:  CT angio head and neck 08/23/2023 FINDINGS: Brain: A 5 mm acute nonhemorrhagic infarct is present in the lateral left thalamus. T2 and FLAIR hyperintensity is associated. No other significant scratched at no significant white matter disease is present. Deep brain nuclei are within normal limits. The ventricles are of normal size. No significant extraaxial fluid collection is present. A dural based enhancing mass lesion at the right CP angle measures 14 x 12 x 15 mm. This sits along the anterior aspect of the porous acoustic [place] but does not enter the IAC. The seventh and eighth cranial nerves are separate from the lesion. The left ICA is within normal limits. Vascular: Flow is present in the major intracranial arteries. Skull and upper cervical spine: The craniocervical junction is normal. Upper cervical spine is within normal limits. Marrow signal is unremarkable. Sinuses/Orbits: A right mastoid effusion is present. No obstructing nasopharyngeal lesion is present. The paranasal sinuses and mastoid air cells are otherwise clear. The globes and orbits are within normal limits. IMPRESSION: 1. 5 mm acute nonhemorrhagic infarct of the lateral left thalamus.  2. 14 x 12 x 15 mm dural based enhancing mass lesion at the right CP angle consistent with a meningioma. 3. Right mastoid  effusion. No obstructing nasopharyngeal lesion is present. These results were called by telephone at the time of interpretation on 08/23/2023 at 4:08 pm to provider [facility] TRIPLETT , who verbally acknowledged these results. Electronically Signed   By: Marin Roberts M.D.   On: 08/23/2023 16:17   CT ANGIO HEAD NECK W WO CM Result Date: 08/23/2023 CLINICAL DATA:  Headache, sudden, severe. Intermittent right-sided numbness. Left-sided neck pain. EXAM: CT ANGIOGRAPHY HEAD AND NECK WITH AND WITHOUT CONTRAST TECHNIQUE: Multidetector CT imaging of the head and neck was performed using the standard protocol during bolus administration of intravenous contrast. Multiplanar CT image reconstructions and MIPs were obtained to evaluate the vascular anatomy. Carotid stenosis measurements (when applicable) are obtained utilizing NASCET criteria, using the distal internal carotid diameter as the denominator. RADIATION DOSE REDUCTION: This exam was performed according to the departmental dose-optimization program which includes automated exposure control, adjustment of the mA and/or kV according to patient size and/or use of iterative reconstruction technique. CONTRAST:  75mL OMNIPAQUE IOHEXOL 350 MG/ML SOLN COMPARISON:  None Available. FINDINGS: CT HEAD FINDINGS Brain: No acute infarct, hemorrhage, or mass lesion is present. No significant white matter lesions are present. The ventricles are of normal size. Deep brain nuclei are within normal limits. No significant extraaxial fluid collection is present. The brainstem and cerebellum are within normal limits. Midline structures are within normal limits. Vascular: No hyperdense vessel or unexpected calcification. Skull: Calvarium is intact. No focal lytic or blastic lesions are present. No significant extracranial soft tissue lesion is present. Sinuses/Orbits: The paranasal sinuses and mastoid air cells are clear. The globes and orbits are within normal limits. Other:  Postcontrast images demonstrate an enhancing dural-based mass lesion measuring 12 x 10 x 13 mm at the porous acusticus and extending inferiorly. The lesion is not extend into the IAC. Review of the MIP images confirms the above findings CTA NECK FINDINGS Aortic arch: A 3 vessel arch configuration is present. No significant vascular disease or stenosis is present. Right carotid system: The right common carotid artery is within normal limits. Bifurcation is unremarkable. Cervical right ICA is normal. Left carotid system: The left common carotid artery is within normal limits. Bifurcation is unremarkable. The cervical left ICA demonstrates mild tortuosity just below the skull base. Vertebral arteries: The vertebral arteries are codominant. Both vertebral arteries originate from the subclavian arteries. No significant stenosis is present in either vertebral artery in the neck. Skeleton: Vertebral body heights alignment are normal. No significant listhesis is present. Mild straightening of the normal cervical lordosis is present. No focal osseous lesions are present. Other neck: The soft tissues of the neck are otherwise unremarkable. Salivary glands are within normal limits. Thyroid is normal. No significant adenopathy is present. No focal mucosal or submucosal lesions are present. Upper chest: The lung apices are clear. The thoracic inlet is within normal limits. Review of the MIP images confirms the above findings CTA HEAD FINDINGS Anterior circulation: The internal carotid arteries are within normal limits the skull base to the ICA termini. The A1 and M1 segments are normal. The right A1 segment is aplastic. Both A2 segments fill from the left. M1 segments are normal bilaterally. The MCA bifurcations are within normal limits. The ACA and MCA branch vessels are normal bilaterally. No aneurysm is present. Posterior circulation: PICA origins are visualized and within normal limits. The left  vertebral artery is centrally  terminates at the PICA extending only very small distal V4 segment. The vertebrobasilar junction is normal. The basilar artery is hypoplastic. The left vertebral artery arises from the basilar tip. Moderate proximal left P2 segment stenosis is present. The right posterior cerebral artery is of fetal type. The right posterior cerebral artery is of fetal type. Superior division left P3 segments are not well seen. Venous sinuses: The dural sinuses are patent. The straight sinus and deep cerebral veins are intact. Cortical veins are within normal limits. No significant vascular malformation is evident. Anatomic variants: Fetal type right posterior cerebral artery. Aplastic right A1 segment. Review of the MIP images confirms the above findings IMPRESSION: 1. 12 x 10 x 13 mm enhancing dural-based mass lesion at the porous acusticus and extending inferiorly. This most likely represents meningioma. A vestibular schwannoma is also considered. 2. Otherwise normal CT appearance of the brain. 3. No significant stenosis, aneurysm, or branch vessel occlusion within the anterior circulation. 4. Moderate proximal left P2 segment stenosis. 5. Superior division left P3 segments are not well seen. This may be related to stenosis or occlusion. 6. Normal CTA of the neck. Recommend MR head without and with contrast for further evaluation. These results were called by telephone at the time of interpretation on 08/23/2023 at 12:34 pm to provider [facility] TRIPLETT , who verbally acknowledged these results. Electronically Signed   By: Marin Roberts M.D.   On: 08/23/2023 12:34     Assessment and Plan: No notes have been filed under this hospital service. Service: Hospitalist  Principal Problem:   Ischemic stroke [facility]) Active Problems:   Depression with anxiety   Gastroesophageal reflux disease without esophagitis   Meningioma (HCC)  Ischemic stroke Observation on telemetry Echocardiogram tomorrow Hemoglobin A1c, lipid  panel in the morning PT/OT/speech therapy consult Full aspirin Hypercoagulable panel ESR, CRP Neurology to consult Meningioma Follow-up with neurosurgery in the office GERD  Depression Continue Wellbutrin and Zoloft   Advance Care Planning:   Code Status: Full Code   Consults: Neurology  Family Communication: husband present during interview and exam  Severity of Illness: The appropriate patient status for this patient is INPATIENT. Inpatient status is judged to be reasonable and necessary in order to provide the required intensity of service to ensure the patient's safety. The patient's presenting symptoms, physical exam findings, and initial radiographic and laboratory data in the context of their chronic comorbidities is felt to place them at high risk for further clinical deterioration. Furthermore, it is not anticipated that the patient will be medically stable for discharge from the hospital within 2 midnights of admission.   * I certify that at the point of admission it is my clinical judgment that the patient will require inpatient hospital care spanning beyond 2 midnights from the point of admission due to high intensity of service, high risk for further deterioration and high frequency of surveillance required.*  Author: Levie Heritage, DO 08/23/2023 7:07 PM  For on call review [email or web address].

## 2023-08-24 ENCOUNTER — Inpatient Hospital Stay (HOSPITAL_COMMUNITY)

## 2023-08-24 DIAGNOSIS — J45909 Unspecified asthma, uncomplicated: Secondary | ICD-10-CM | POA: Diagnosis present

## 2023-08-24 DIAGNOSIS — I6389 Other cerebral infarction: Secondary | ICD-10-CM

## 2023-08-24 DIAGNOSIS — I639 Cerebral infarction, unspecified: Secondary | ICD-10-CM | POA: Diagnosis not present

## 2023-08-24 DIAGNOSIS — R29700 NIHSS score 0: Secondary | ICD-10-CM | POA: Diagnosis not present

## 2023-08-24 DIAGNOSIS — G4486 Cervicogenic headache: Secondary | ICD-10-CM | POA: Diagnosis not present

## 2023-08-24 LAB — C-REACTIVE PROTEIN: CRP: <0.5 mg/dL (ref <1.0)

## 2023-08-24 LAB — HEMOGLOBIN A1C
Hgb A1c MFr Bld: 4.5 % — ABNORMAL LOW (ref 4.8–5.6)
Mean Plasma Glucose: 82.45 mg/dL

## 2023-08-24 LAB — ECHOCARDIOGRAM COMPLETE
Area-P 1/2: 4.25 cm2
Calc EF: 60.5 %
Height: 64 in
S' Lateral: 2.8 cm
Single Plane A2C EF: 62.3 %
Single Plane A4C EF: 58 %
Weight: 2480 [oz_av]

## 2023-08-24 LAB — LIPID PANEL
Cholesterol: 171 mg/dL (ref 0–200)
HDL: 48 mg/dL (ref >40)
LDL Cholesterol: 110 mg/dL — ABNORMAL HIGH (ref 0–99)
Total CHOL/HDL Ratio: 3.6 ratio
Triglycerides: 66 mg/dL (ref <150)
VLDL: 13 mg/dL (ref 0–40)

## 2023-08-24 LAB — ANTITHROMBIN III: AntiThromb III Func: 112 % (ref 75–120)

## 2023-08-24 LAB — HIV ANTIBODY (ROUTINE TESTING W REFLEX): HIV Screen 4th Generation wRfx: NONREACTIVE

## 2023-08-24 MED ORDER — ACETAMINOPHEN 650 MG RE SUPP: 650.0000 mg | RECTAL | Status: DC | PRN

## 2023-08-24 MED ORDER — ACETAMINOPHEN 160 MG/5ML PO SOLN: 650.0000 mg | ORAL | Status: DC | PRN

## 2023-08-24 MED ORDER — ORAL CARE MOUTH RINSE: 15.0000 mL | OROMUCOSAL | Status: DC | PRN

## 2023-08-24 MED ORDER — SERTRALINE HCL 100 MG PO TABS
100.0000 mg | ORAL_TABLET | Freq: Every day | ORAL | Status: DC
  Administered 2023-08-24 – 2023-08-27 (×4): 100 mg via ORAL
  Filled 2023-08-24 (×2): qty 1
  Filled 2023-08-24: qty 2
  Filled 2023-08-24: qty 1

## 2023-08-24 MED ORDER — MUSCLE RUB 10-15 % EX CREA
TOPICAL_CREAM | CUTANEOUS | Status: DC | PRN
  Filled 2023-08-24: qty 85

## 2023-08-24 MED ORDER — ASPIRIN 81 MG PO TBEC
81.0000 mg | DELAYED_RELEASE_TABLET | Freq: Every day | ORAL | Status: DC
Start: 2023-08-24 — End: 2023-08-24

## 2023-08-24 MED ORDER — ENOXAPARIN SODIUM 40 MG/0.4ML IJ SOSY
40.0000 mg | PREFILLED_SYRINGE | INTRAMUSCULAR | Status: DC
  Administered 2023-08-24 – 2023-08-26 (×3): 40 mg via SUBCUTANEOUS
  Filled 2023-08-24 (×3): qty 0.4

## 2023-08-24 MED ORDER — SENNOSIDES-DOCUSATE SODIUM 8.6-50 MG PO TABS: 1.0000 | ORAL_TABLET | Freq: Every evening | ORAL | Status: DC | PRN

## 2023-08-24 MED ORDER — ASPIRIN 81 MG PO TBEC
81.0000 mg | DELAYED_RELEASE_TABLET | Freq: Every day | ORAL | Status: DC
  Administered 2023-08-25 – 2023-08-27 (×3): 81 mg via ORAL
  Filled 2023-08-24 (×3): qty 1

## 2023-08-24 MED ORDER — BUPROPION HCL ER (XL) 150 MG PO TB24
300.0000 mg | ORAL_TABLET | Freq: Every day | ORAL | Status: DC
  Administered 2023-08-24: 300 mg via ORAL
  Filled 2023-08-24: qty 2

## 2023-08-24 MED ORDER — ACETAMINOPHEN 325 MG PO TABS
650.0000 mg | ORAL_TABLET | ORAL | Status: DC | PRN
  Administered 2023-08-25 – 2023-08-27 (×2): 650 mg via ORAL
  Filled 2023-08-24 (×2): qty 2

## 2023-08-24 MED ORDER — FAMOTIDINE 20 MG PO TABS
20.0000 mg | ORAL_TABLET | Freq: Two times a day (BID) | ORAL | Status: DC
  Administered 2023-08-24 – 2023-08-27 (×7): 20 mg via ORAL
  Filled 2023-08-24 (×7): qty 1

## 2023-08-24 MED ORDER — BUPROPION HCL ER (XL) 150 MG PO TB24
300.0000 mg | ORAL_TABLET | Freq: Every day | ORAL | Status: DC
  Administered 2023-08-26 – 2023-08-27 (×2): 300 mg via ORAL
  Filled 2023-08-24 (×3): qty 2

## 2023-08-24 MED ORDER — SODIUM CHLORIDE 0.9 % IV SOLN: INTRAVENOUS | Status: DC

## 2023-08-24 MED ORDER — ATORVASTATIN CALCIUM 40 MG PO TABS
40.0000 mg | ORAL_TABLET | Freq: Every day | ORAL | Status: DC
  Administered 2023-08-24 – 2023-08-27 (×4): 40 mg via ORAL
  Filled 2023-08-24 (×4): qty 1

## 2023-08-24 MED ORDER — STROKE: EARLY STAGES OF RECOVERY BOOK
Freq: Once | Status: AC
  Filled 2023-08-24 (×2): qty 1

## 2023-08-24 MED ORDER — ASPIRIN 325 MG PO TBEC
325.0000 mg | DELAYED_RELEASE_TABLET | Freq: Once | ORAL | Status: AC
  Administered 2023-08-24: 325 mg via ORAL
  Filled 2023-08-24: qty 1

## 2023-08-24 MED ORDER — ALBUTEROL SULFATE (2.5 MG/3ML) 0.083% IN NEBU: 2.5000 mg | INHALATION_SOLUTION | Freq: Four times a day (QID) | RESPIRATORY_TRACT | Status: DC | PRN

## 2023-08-24 NOTE — Progress Notes (Signed)
 Patient received from [facility], alert and oriented X4, vital signs stable, tele box connected, call bell within reach, will continue to monitor.

## 2023-08-24 NOTE — ED Notes (Signed)
Report to [facility]  

## 2023-08-24 NOTE — ED Notes (Signed)
Report to [facility].

## 2023-08-24 NOTE — Consult Note (Signed)
 NEUROLOGY CONSULT NOTE   Date of service: August 24, 2023 Patient Name: Brianna Griffin MRN:  [id number] DOB:  [birth year] Chief Complaint: "thalamic stroke" Requesting Provider: Shon Hale, MD  History of Present Illness  Brianna Griffin is a 39 y.o. female with hx ofGERD, migraine headaches, miscarriage, anxiety/depression who presented to [facility] ED with R sided numbness and headache and difficulty with ambulation.  Headache has been persistent over the last month. Feels like a spasm in her left trapezius that radiates into her occipit. Wednesday, was showering and noted that the right torso felt numb/asleep. Thursday it was the right arm and then next day it was a large part of the right side.  Came to the ED for further workup and MRI Brain showed a small left thalamic stroke. CTA with no LVO.  She denies any prior hx of strokes, grandfather had strokes, no hx of DM2, HTN. Has HLD. Does not smoke, does not use any recreational substances, does not drink EtOH. Reports 3 miscarriages. No hx of CHF, PFO, no hx of afibb.  Recent ear infection s/p antibiotics. Used depot injection for birth control but was due to get in feb/march of this year but did not get it.  LKW: 08/20/23 Modified rankin score: 0-Completely asymptomatic and back to baseline post- stroke IV Thrombolysis: not offered, outside window and too mild to treat. EVT: not offered, no LVO. Too mild to treat.   NIHSS components Score: Comment  1a Level of Conscious 0[x]  1[]  2[]  3[]      1b LOC Questions 0[x]  1[]  2[]       1c LOC Commands 0[x]  1[]  2[]       2 Best Gaze 0[x]  1[]  2[]       3 Visual 0[x]  1[]  2[]  3[]      4 Facial Palsy 0[x]  1[]  2[]  3[]      5a Motor Arm - left 0[x]  1[]  2[]  3[]  4[]  UN[]    5b Motor Arm - Right 0[x]  1[]  2[]  3[]  4[]  UN[]    6a Motor Leg - Left 0[x]  1[]  2[]  3[]  4[]  UN[]    6b Motor Leg - Right 0[x]  1[]  2[]  3[]  4[]  UN[]    7 Limb Ataxia 0[x]  1[]  2[]  3[]  UN[]     8 Sensory 0[x]  1[]  2[]  UN[]      9 Best  Language 0[x]  1[]  2[]  3[]      10 Dysarthria 0[x]  1[]  2[]  UN[]      11 Extinct. 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   IBS (irritable bowel syndrome)    Migraine headache    Migraines    Miscarriage 09/09/2013   Thyroid disease    hypothyroid   Vaginal Pap smear, abnormal     Past Surgical History:  Procedure Laterality Date   CYST REMOVAL NECK      Family History: Family History  Problem Relation Age of Onset   Diabetes Maternal Grandmother    Hypertension Maternal Grandmother    Cancer Maternal Grandfather        bladder and liver   Crohn's disease Maternal Grandfather    Diabetes Father    Hypertension  Father    Post-traumatic stress disorder Father    ADD / ADHD Brother    Depression Brother    ADD / ADHD Sister    Depression Sister    ADD / ADHD Daughter    Epilepsy Daughter    Epilepsy Son    ADD / ADHD Son    Autism Son     Social History  reports that she has never smoked. She has never used smokeless tobacco. She reports current alcohol use. She reports that she does not use drugs.  Allergies  Allergen Reactions   Ibuprofen Hives    Medications   Current Facility-Administered Medications:     stroke: early stages of recovery book, , Does not apply, Once, Levie Heritage, DO   0.9 %  sodium chloride infusion, , Intravenous, Continuous, Levie Heritage, DO, Held at 08/24/23 1610   acetaminophen (TYLENOL) tablet 650 mg, 650 mg, Oral, Q4H PRN **OR** acetaminophen (TYLENOL) 160 MG/5ML solution 650 mg, 650 mg, Per Tube, Q4H PRN **OR** acetaminophen (TYLENOL) suppository 650 mg, 650 mg, Rectal, Q4H PRN, Levie Heritage, DO   [START ON 08/25/2023] aspirin EC tablet 81 mg, 81 mg, Oral, Q breakfast, Emokpae, Courage, MD   atorvastatin (LIPITOR) tablet 40 mg, 40 mg, Oral, Daily, Emokpae, Courage, MD, 40 mg at 08/24/23 9604   buPROPion (WELLBUTRIN XL) 24 hr tablet 300 mg, 300 mg, Oral, QHS, Stinson, Jacob J, DO   enoxaparin (LOVENOX) injection 40 mg, 40 mg, Subcutaneous, Q24H, Stinson, Jacob J, DO   famotidine (PEPCID) tablet 20 mg, 20 mg, Oral, BID, Stinson, Jacob J, DO, 20 mg at 08/24/23 5409   senna-docusate (Senokot-S) tablet 1 tablet, 1 tablet, Oral, QHS PRN, Levie Heritage, DO   sertraline (ZOLOFT) tablet 100 mg, 100 mg, Oral, Daily, Levie Heritage, DO, 100 mg at 08/24/23 0924  Vitals   Vitals:   08/24/23 1400 08/24/23 1502 08/24/23 1522 08/24/23 1645  BP: 121/79  130/88 128/88  Pulse: 100  (!) 108 99  Resp: 15  12 14   Temp:  98.6 F (37 C)  98.5 F (36.9 C)  TempSrc:  Oral  Oral  SpO2: 98%  99% 99%  Weight:      Height:        Body mass index is 26.61 kg/m.  Physical Exam   General: Laying comfortably in bed; in no acute distress.  HENT: Normal oropharynx and mucosa. Normal external appearance of ears and nose.  Neck: Supple, no pain or tenderness  CV: No JVD. No peripheral edema.  Pulmonary: Symmetric Chest rise. Normal respiratory effort.  Abdomen: Soft to touch, non-tender.  Ext: No cyanosis, edema, or deformity  Skin: No rash. Normal palpation of skin.   Musculoskeletal: Normal digits and nails by inspection. No clubbing.   Neurologic Examination  Mental status/Cognition: Alert, oriented to self, place, month and year, good attention.  Speech/language: Fluent, comprehension intact, object naming intact, repetition intact.  Cranial nerves:   CN II Pupils equal and reactive to light, no VF deficits    CN III,IV,VI EOM intact, no gaze preference or deviation, no nystagmus    CN V normal sensation in V1, V2, and V3 segments bilaterally   CN VII no asymmetry, no nasolabial fold  flattening    CN VIII normal hearing to speech    CN IX & X normal palatal elevation, no uvular deviation    CN XI 5/5 head turn and 5/5 shoulder shrug bilaterally    CN XII midline  tongue protrusion    Motor:  Muscle bulk: normal, tone normal, pronator drift none tremor none Mvmt Root Nerve  Muscle Right Left Comments  SA C5/6 Ax Deltoid 5 5   EF C5/6 Mc Biceps 5 5   EE C6/7/8 Rad Triceps 5 5   WF C6/7 Med FCR     WE C7/8 PIN ECU     F Ab C8/T1 U ADM/FDI 5 5   HF L1/2/3 Fem Illopsoas 5 5   KE L2/3/4 Fem Quad 5 5   DF L4/5 D Peron Tib Ant 5 5   PF S1/2 Tibial Grc/Sol 5 5    Sensation:  Light touch Intact throughout   Pin prick    Temperature    Vibration   Proprioception    Coordination/Complex Motor:  - Finger to Nose intact BL - Heel to shin intact BL - Rapid alternating movement are normal - Gait: deferred.  Labs/Imaging/Neurodiagnostic studies   CBC:  Recent Labs  Lab Sep 18, 2023 1051  WBC 6.9  NEUTROABS 5.1  HGB 13.4  HCT 40.3  MCV 88.2  PLT 204   Basic Metabolic Panel:  Lab Results  Component Value Date   NA 136 18-Sep-2023   K 4.0 09-18-2023   CO2 24 18-Sep-2023   GLUCOSE 96 09-18-2023   BUN 9 Sep 18, 2023   CREATININE 0.75 18-Sep-2023   CALCIUM 9.4 09-18-2023   GFRNONAA >60 Sep 18, 2023   GFRAA 138 06/14/2020   Lipid Panel:  Lab Results  Component Value Date   LDLCALC 110 (H) 08/24/2023   HgbA1c:  Lab Results  Component Value Date   HGBA1C 4.5 (L) 08/24/2023   Urine Drug Screen:     Component Value Date/Time   LABOPIA NEG 03/04/2014 1222   COCAINSCRNUR Negative 02/02/2015 1629   COCAINSCRNUR NEG 03/04/2014 1222   LABBENZ Negative 02/02/2015 1629   LABBENZ NEG 03/04/2014 1222   AMPHETMU Negative 02/02/2015 1629    Alcohol Level No results found for: "ETH" INR No results found for: "INR" APTT No results found for: "APTT" AED levels: No results found for: "PHENYTOIN", "ZONISAMIDE", "LAMOTRIGINE", "LEVETIRACETA"  CT Head without  contrast(Personally reviewed): CTH was negative for a large hypodensity concerning for a large territory infarct or hyperdensity concerning for an ICH. Right CP meningioma.  CT angio Head and Neck with contrast(Personally reviewed): No LVO, no aneurysm.  MRI Brain(Personally reviewed): Small 5mm left thalamic stroke. Right CP Meningioma.  ASSESSMENT   Brianna Griffin is a 38 y.o. female who presents with R sided numbness, found to have a 5mm left thalamic stroke.  Suspect this is likely a small vessel stroke given the location is typical of a lacunar infarct and the size is consistent with a small vessel stroke. She does not have classic small vessel risk factors except for hyperlipidemia. Endorses prior hx of 3 miscarriages but no hx of clotting disorder, APLA still on the differential but small vessel stroke is not typical of APLA.  RECOMMENDATIONS  - Frequent Neuro checks per stroke unit protocol - agree with atorvastatin 40mg  daily. - Recommend HbA1c to evaluate for diabetes and how well it is controlled. - Antithrombotic - Aspirin 81mg  daily along with plavix 75mg  daily x 21 days, followed by Aspirin 81mg  daily alone. - Recommend DVT ppx - SBP goal - aim for gradual normotension. - Recommend Telemetry monitoring for arrythmia - Recommend bedside swallow screen prior to PO intake. - Stroke education booklet - Recommend PT/OT/SLP consult  Cervicogenic headache: - topical menthol spray PRN to neck/back of  her head.  ______________________________________________________________________    Welton Flakes, MD [facility]

## 2023-08-24 NOTE — Evaluation (Signed)
 Physical Therapy Brief Evaluation and Discharge Note Patient Details Name: Brianna Griffin MRN: [id number] DOB: 07/30/1985 Today's Date: 08/24/2023   History of Present Illness  PT having numbness on the Rt side of her body.  MRI reveals  LT lateral thalmus infaract  Clinical Impression  PT feels she is back to baseline. NO weakness or balance issues noted        PT Assessment Patient does not need any further PT services  Assistance Needed at Discharge   none    Equipment Recommendations  none  Recommendations for Other Services   none    Precautions/Restrictions Precautions Precautions: None Restrictions Weight Bearing Restrictions Per Provider Order: No        Mobility  Bed Mobility Rolling: Independent        Transfers Overall transfer level: Independent                      Ambulation/Gait Ambulation/Gait assistance: Independent Gait Distance (Feet): 200 Feet Assistive device: None         Balance Overall balance assessment: Independent                        Pertinent Vitals/Pain   Pain Assessment Pain Assessment: No/denies pain     Home Living Family/patient expects to be discharged to:: Private residence Living Arrangements: Spouse/significant other Available Help at Discharge: Family Home Environment: Stairs to enter  [employer] of Steps: 2          Prior Function Level of Independence: Independent      UE/LE Assessment   UE ROM/Strength/Tone/Coordination: WFL    LE ROM/Strength/Tone/Coordination: [facility]      Communication   Communication Communication: No apparent difficulties     Cognition Overall Cognitive Status: Appears within functional limits for tasks assessed/performed       General Comments      Exercises     Assessment/Plan    PT Problem List         PT Visit Diagnosis Unsteadiness on feet (R26.81)    No Skilled PT Patient is independent with all acitivity/mobility    AMPAC  6 Clicks Help needed turning from your back to your side while in a flat bed without using bedrails?: None Help needed moving from lying on your back to sitting on the side of a flat bed without using bedrails?: None Help needed moving to and from a bed to a chair (including a wheelchair)?: None Help needed standing up from a chair using your arms (e.g., wheelchair or bedside chair)?: None Help needed to walk in hospital room?: None Help needed climbing 3-5 steps with a railing? : A Little 6 Click Score: 23      End of Session Equipment Utilized During Treatment: Gait belt Activity Tolerance: Patient tolerated treatment well Patient left: in bed;with call bell/phone within reach   PT Visit Diagnosis: Unsteadiness on feet (R26.81)     Time: 4098-1191 PT Time Calculation (min) (ACUTE ONLY): 10 min  Charges:   PT Evaluation $PT Eval Low Complexity: 1 Low       Virgina Organ, PT CLT [phone]   08/24/2023, 12:13 PM

## 2023-08-24 NOTE — Plan of Care (Signed)
  Problem: Education: Goal: Knowledge of disease or condition will improve Outcome: Progressing Goal: Knowledge of secondary prevention will improve (MUST DOCUMENT ALL) Outcome: Progressing   Problem: Ischemic Stroke/TIA Tissue Perfusion: Goal: Complications of ischemic stroke/TIA will be minimized Outcome: Progressing   Problem: Coping: Goal: Will verbalize positive feelings about self Outcome: Progressing   Problem: Health Behavior/Discharge Planning: Goal: Goals will be collaboratively established with patient/family Outcome: Progressing   Problem: Self-Care: Goal: Ability to communicate needs accurately will improve Outcome: Progressing   Problem: Nutrition: Goal: Risk of aspiration will decrease Outcome: Progressing Goal: Dietary intake will improve Outcome: Progressing

## 2023-08-24 NOTE — Progress Notes (Signed)
 PROGRESS NOTE     Brianna Griffin, is a 38 y.o. female, DOB - July 17, 1985, WGN:[id number]  Admit date - 08/23/2023   Admitting Physician Levie Heritage, DO  Outpatient Primary MD for the patient is Tommie Sams, DO  LOS - 1  Chief Complaint  Patient presents with   Numbness        Brief Summary:  38 y.o. female with medical history significant of anxiety/depression, GERD, IBS , non-smoker , and Chronic migraines and headacheswho was on Depo shots with no family history of VTE or strokes in first-degree relatives-admitted on 08/23/2023 with 5 mm acute nonhemorrhagic infarct of the lateral left thalamus and 14 x 12 x 15 mm dural based enhancing mass lesion at the right CP angle consistent with a meningioma..  -Neurologist recommended transfer to [facility] for further evaluation and possible LP with cell count + differential, protein, glucose, OCBs, IgG index. Also recommended vasculitis panel and hypercoagulable panel.     -Assessment and Plan: 1)Acute Stroke--- brain MRI shows 5 mm acute nonhemorrhagic infarct of the lateral left thalamus  -- Patient has right-sided numbness -No significant stroke risk per se--please see Brief Summary section above -CTA Head and Neck-No significant stenosis, aneurysm, or branch vessel occlusion within the anterior circulation.  Moderate proximal left P2 segment stenosis.  Superior division left P3 segments are not well seen. This may be related to stenosis or occlusion, and  Normal CTA of the neck. --Echo pending -A1c 4.5 -LDL 110, HDL 48 total cholesterol 171 triglycerides 66 -ESR and CRP Not elevated -Neurology input from Dr. Otelia Limes appreciated----Neurologist recommended transfer to [facility] for further evaluation and possible LP with cell count + differential, protein, glucose, OCBs, IgG index. Also recommended vasculitis panel and hypercoagulable panel -Physical therapy eval appreciated -- Continue aspirin, Lipitor  2)Meningioma--brain  imaging studies showed  14 x 12 x 15 mm enhancing dural-based mass lesion at the porous acusticus and extending inferiorly. This most likely represents meningioma. A vestibular schwannoma is also considered. -Neurosurgical team recommends outpatient follow-up  3) chronic migraine/headaches--worse lately -Neurology consult requested  4)Depression and Anxiety----stable, continue Zoloft and Wellbutrin  5)GERD--stable, continue Pepcid  Status is: Inpatient   Disposition: The patient is from: Home              Anticipated d/c is to: Home              Anticipated d/c date is: 2 days              Patient currently is not medically stable to d/c. Barriers: Not Clinically Stable-   Code Status :  -  Code Status: Full Code   Family Communication:    (patient is alert, awake and coherent)  Discussed with Husband at bedside  DVT Prophylaxis  :   - SCDs   enoxaparin (LOVENOX) injection 40 mg Start: 08/24/23 2200   Lab Results  Component Value Date   PLT 204 08/23/2023    Inpatient Medications  Scheduled Meds:   stroke: early stages of recovery book   Does not apply Once   [START ON 08/25/2023] aspirin EC  81 mg Oral Q breakfast   atorvastatin  40 mg Oral Daily   buPROPion  300 mg Oral QHS   enoxaparin (LOVENOX) injection  40 mg Subcutaneous Q24H   famotidine  20 mg Oral BID   sertraline  100 mg Oral Daily   Continuous Infusions:  sodium chloride Stopped (08/24/23 0915)   PRN Meds:.acetaminophen **OR** acetaminophen (  TYLENOL) oral liquid 160 mg/5 mL **OR** acetaminophen, senna-docusate   Anti-infectives (From admission, onward)    None         Subjective: Brianna Griffin today has no fevers, no emesis,  No chest pain,   - Husband at bedside, questions answered   Objective: Vitals:   08/24/23 1000 08/24/23 1200 08/24/23 1400 08/24/23 1502  BP: 126/81 115/85 121/79   Pulse: 99 (!) 109 100   Resp: 17 (!) 21 15   Temp:    98.6 F (37 C)  TempSrc:    Oral  SpO2: 95%  96% 98%   Weight:      Height:        Intake/Output Summary (Last 24 hours) at 08/24/2023 1509 Last data filed at 08/24/2023 0915 Gross per 24 hour  Intake 120 ml  Output --  Net 120 ml   Filed Weights   08/23/23 0932  Weight: 70.3 kg    Physical Exam Gen:- Awake Alert, in no acute distress HEENT:- [place].AT, No sclera icterus Neck-Supple Neck,No JVD,.  Lungs-  CTAB , fair symmetrical air movement CV- S1, S2 normal, regular  Abd-  +ve B.Sounds, Abd Soft, No tenderness,    Extremity/Skin:- No  edema, pedal pulses present  Psych-affect is appropriate, oriented x3 Neuro-mild right-sided UE numbness persist , no additional new focal deficits, no tremors  Data Reviewed: I have personally reviewed following labs and imaging studies  CBC: Recent Labs  Lab 08/23/23 1051  WBC 6.9  NEUTROABS 5.1  HGB 13.4  HCT 40.3  MCV 88.2  PLT 204   Basic Metabolic Panel: Recent Labs  Lab 08/23/23 1051  NA 136  K 4.0  CL 103  CO2 24  GLUCOSE 96  BUN 9  CREATININE 0.75  CALCIUM 9.4   GFR: Estimated Creatinine Clearance: 92.6 mL/min (by C-G formula based on SCr of 0.75 mg/dL).  HbA1C: Recent Labs    08/24/23 0833  HGBA1C 4.5*   Radiology Studies: MR Brain W and Wo Contrast Result Date: 08/23/2023 CLINICAL DATA:  Headache.  Intermittent right-sided numbness. EXAM: MRI HEAD WITHOUT AND WITH CONTRAST TECHNIQUE: Multiplanar, multiecho pulse sequences of the brain and surrounding structures were obtained without and with intravenous contrast. CONTRAST:  7mL GADAVIST GADOBUTROL 1 MMOL/ML IV SOLN COMPARISON:  CT angio head and neck 08/23/2023 FINDINGS: Brain: A 5 mm acute nonhemorrhagic infarct is present in the lateral left thalamus. T2 and FLAIR hyperintensity is associated. No other significant scratched at no significant white matter disease is present. Deep brain nuclei are within normal limits. The ventricles are of normal size. No significant extraaxial fluid collection is present. A  dural based enhancing mass lesion at the right CP angle measures 14 x 12 x 15 mm. This sits along the anterior aspect of the porous acoustic [place] but does not enter the IAC. The seventh and eighth cranial nerves are separate from the lesion. The left ICA is within normal limits. Vascular: Flow is present in the major intracranial arteries. Skull and upper cervical spine: The craniocervical junction is normal. Upper cervical spine is within normal limits. Marrow signal is unremarkable. Sinuses/Orbits: A right mastoid effusion is present. No obstructing nasopharyngeal lesion is present. The paranasal sinuses and mastoid air cells are otherwise clear. The globes and orbits are within normal limits. IMPRESSION: 1. 5 mm acute nonhemorrhagic infarct of the lateral left thalamus. 2. 14 x 12 x 15 mm dural based enhancing mass lesion at the right CP angle consistent with a meningioma. 3. Right  mastoid effusion. No obstructing nasopharyngeal lesion is present. These results were called by telephone at the time of interpretation on 08/23/2023 at 4:08 pm to provider [facility] , who verbally acknowledged these results. Electronically Signed   By: Marin Roberts M.D.   On: 08/23/2023 16:17   CT ANGIO HEAD NECK W WO CM Result Date: 08/23/2023 CLINICAL DATA:  Headache, sudden, severe. Intermittent right-sided numbness. Left-sided neck pain. EXAM: CT ANGIOGRAPHY HEAD AND NECK WITH AND WITHOUT CONTRAST TECHNIQUE: Multidetector CT imaging of the head and neck was performed using the standard protocol during bolus administration of intravenous contrast. Multiplanar CT image reconstructions and MIPs were obtained to evaluate the vascular anatomy. Carotid stenosis measurements (when applicable) are obtained utilizing NASCET criteria, using the distal internal carotid diameter as the denominator. RADIATION DOSE REDUCTION: This exam was performed according to the departmental dose-optimization program which includes automated  exposure control, adjustment of the mA and/or kV according to patient size and/or use of iterative reconstruction technique. CONTRAST:  75mL OMNIPAQUE IOHEXOL 350 MG/ML SOLN COMPARISON:  None Available. FINDINGS: CT HEAD FINDINGS Brain: No acute infarct, hemorrhage, or mass lesion is present. No significant white matter lesions are present. The ventricles are of normal size. Deep brain nuclei are within normal limits. No significant extraaxial fluid collection is present. The brainstem and cerebellum are within normal limits. Midline structures are within normal limits. Vascular: No hyperdense vessel or unexpected calcification. Skull: Calvarium is intact. No focal lytic or blastic lesions are present. No significant extracranial soft tissue lesion is present. Sinuses/Orbits: The paranasal sinuses and mastoid air cells are clear. The globes and orbits are within normal limits. Other: Postcontrast images demonstrate an enhancing dural-based mass lesion measuring 12 x 10 x 13 mm at the porous acusticus and extending inferiorly. The lesion is not extend into the IAC. Review of the MIP images confirms the above findings CTA NECK FINDINGS Aortic arch: A 3 vessel arch configuration is present. No significant vascular disease or stenosis is present. Right carotid system: The right common carotid artery is within normal limits. Bifurcation is unremarkable. Cervical right ICA is normal. Left carotid system: The left common carotid artery is within normal limits. Bifurcation is unremarkable. The cervical left ICA demonstrates mild tortuosity just below the skull base. Vertebral arteries: The vertebral arteries are codominant. Both vertebral arteries originate from the subclavian arteries. No significant stenosis is present in either vertebral artery in the neck. Skeleton: Vertebral body heights alignment are normal. No significant listhesis is present. Mild straightening of the normal cervical lordosis is present. No focal  osseous lesions are present. Other neck: The soft tissues of the neck are otherwise unremarkable. Salivary glands are within normal limits. Thyroid is normal. No significant adenopathy is present. No focal mucosal or submucosal lesions are present. Upper chest: The lung apices are clear. The thoracic inlet is within normal limits. Review of the MIP images confirms the above findings CTA HEAD FINDINGS Anterior circulation: The internal carotid arteries are within normal limits the skull base to the ICA termini. The A1 and M1 segments are normal. The right A1 segment is aplastic. Both A2 segments fill from the left. M1 segments are normal bilaterally. The MCA bifurcations are within normal limits. The ACA and MCA branch vessels are normal bilaterally. No aneurysm is present. Posterior circulation: PICA origins are visualized and within normal limits. The left vertebral artery is centrally terminates at the PICA extending only very small distal V4 segment. The vertebrobasilar junction is normal. The basilar artery  is hypoplastic. The left vertebral artery arises from the basilar tip. Moderate proximal left P2 segment stenosis is present. The right posterior cerebral artery is of fetal type. The right posterior cerebral artery is of fetal type. Superior division left P3 segments are not well seen. Venous sinuses: The dural sinuses are patent. The straight sinus and deep cerebral veins are intact. Cortical veins are within normal limits. No significant vascular malformation is evident. Anatomic variants: Fetal type right posterior cerebral artery. Aplastic right A1 segment. Review of the MIP images confirms the above findings IMPRESSION: 1. 12 x 10 x 13 mm enhancing dural-based mass lesion at the porous acusticus and extending inferiorly. This most likely represents meningioma. A vestibular schwannoma is also considered. 2. Otherwise normal CT appearance of the brain. 3. No significant stenosis, aneurysm, or branch  vessel occlusion within the anterior circulation. 4. Moderate proximal left P2 segment stenosis. 5. Superior division left P3 segments are not well seen. This may be related to stenosis or occlusion. 6. Normal CTA of the neck. Recommend MR head without and with contrast for further evaluation. These results were called by telephone at the time of interpretation on 08/23/2023 at 12:34 pm to provider [facility] , who verbally acknowledged these results. Electronically Signed   By: Marin Roberts M.D.   On: 08/23/2023 12:34   Scheduled Meds:   stroke: early stages of recovery book   Does not apply Once   [START ON 08/25/2023] aspirin EC  81 mg Oral Q breakfast   atorvastatin  40 mg Oral Daily   buPROPion  300 mg Oral QHS   enoxaparin (LOVENOX) injection  40 mg Subcutaneous Q24H   famotidine  20 mg Oral BID   sertraline  100 mg Oral Daily   Continuous Infusions:  sodium chloride Stopped (08/24/23 0915)    LOS: 1 day   Shon Hale M.D on 08/24/2023 at 3:09 PM  Go to [email or web address] - for contact info  [facility] - Office  [phone]  If 7PM-7AM, please contact night-coverage [email or web address] 08/24/2023, 3:09 PM

## 2023-08-24 NOTE — Progress Notes (Signed)
  Echocardiogram 2D Echocardiogram has been performed.  Janalyn Harder 08/24/2023, 9:11 AM

## 2023-08-25 ENCOUNTER — Telehealth: Payer: Self-pay | Admitting: Adult Health

## 2023-08-25 ENCOUNTER — Inpatient Hospital Stay (HOSPITAL_COMMUNITY)

## 2023-08-25 DIAGNOSIS — R29701 NIHSS score 1: Secondary | ICD-10-CM | POA: Diagnosis not present

## 2023-08-25 DIAGNOSIS — I6389 Other cerebral infarction: Secondary | ICD-10-CM

## 2023-08-25 DIAGNOSIS — I80291 Phlebitis and thrombophlebitis of other deep vessels of right lower extremity: Secondary | ICD-10-CM | POA: Diagnosis not present

## 2023-08-25 DIAGNOSIS — D329 Benign neoplasm of meninges, unspecified: Secondary | ICD-10-CM | POA: Diagnosis not present

## 2023-08-25 DIAGNOSIS — E785 Hyperlipidemia, unspecified: Secondary | ICD-10-CM

## 2023-08-25 DIAGNOSIS — R9089 Other abnormal findings on diagnostic imaging of central nervous system: Secondary | ICD-10-CM

## 2023-08-25 DIAGNOSIS — I639 Cerebral infarction, unspecified: Secondary | ICD-10-CM

## 2023-08-25 DIAGNOSIS — Z7902 Long term (current) use of antithrombotics/antiplatelets: Secondary | ICD-10-CM

## 2023-08-25 DIAGNOSIS — G4486 Cervicogenic headache: Secondary | ICD-10-CM | POA: Diagnosis not present

## 2023-08-25 LAB — RAPID URINE DRUG SCREEN, HOSP PERFORMED
Amphetamines: NOT DETECTED
Barbiturates: NOT DETECTED
Benzodiazepines: NOT DETECTED
Cocaine: NOT DETECTED
Opiates: NOT DETECTED
Tetrahydrocannabinol: NOT DETECTED

## 2023-08-25 LAB — PROTEIN S, TOTAL: Protein S Ag, Total: 88 % (ref 60–150)

## 2023-08-25 LAB — PROTIME-INR
INR: 1.1 (ref 0.8–1.2)
Prothrombin Time: 14.6 s (ref 11.4–15.2)

## 2023-08-25 LAB — LUPUS ANTICOAGULANT PANEL
DRVVT: 39.6 s (ref 0.0–47.0)
PTT Lupus Anticoagulant: 31.5 s (ref 0.0–43.5)

## 2023-08-25 LAB — PROTEIN C ACTIVITY: Protein C Activity: 108 % (ref 73–180)

## 2023-08-25 LAB — GLUCOSE, CAPILLARY: Glucose-Capillary: 118 mg/dL — ABNORMAL HIGH (ref 70–99)

## 2023-08-25 LAB — PROTEIN S ACTIVITY: Protein S Activity: 100 % (ref 63–140)

## 2023-08-25 MED ORDER — SODIUM CHLORIDE 0.9 % IV SOLN: INTRAVENOUS | Status: AC

## 2023-08-25 MED ORDER — LORAZEPAM 1 MG PO TABS
1.0000 mg | ORAL_TABLET | Freq: Once | ORAL | Status: AC
  Administered 2023-08-25: 1 mg via ORAL
  Filled 2023-08-25: qty 1

## 2023-08-25 MED ORDER — ONDANSETRON HCL 4 MG/2ML IJ SOLN
4.0000 mg | Freq: Four times a day (QID) | INTRAMUSCULAR | Status: DC | PRN
  Administered 2023-08-25: 4 mg via INTRAVENOUS
  Filled 2023-08-25: qty 2

## 2023-08-25 MED ORDER — CLOPIDOGREL BISULFATE 75 MG PO TABS
75.0000 mg | ORAL_TABLET | Freq: Every day | ORAL | Status: DC
  Administered 2023-08-25 – 2023-08-27 (×3): 75 mg via ORAL
  Filled 2023-08-25 (×3): qty 1

## 2023-08-25 NOTE — TOC Initial Note (Addendum)
 Transition of Care [facility]) - Initial/Assessment Note    Patient Details  Name: Brianna Griffin MRN: [id number] Date of Birth: 06/27/1985  Transition of Care [facility]) CM/SW Contact:    Kermit Balo, RN Phone Number: 08/25/2023, 12:38 PM  Clinical Narrative:                  Pt is from  home with her spouse and children. Spouse works during the daytime but she has family on either side of her home that can check on her.  No DME. Pt denies issues with home medications. Pt was driving prior to admission and has assistance if needed.  No follow up per therapies. TOC following.  Expected Discharge Plan: Home/Self Care Barriers to Discharge: Continued Medical Work up   Patient Goals and CMS Choice            Expected Discharge Plan and Services       Living arrangements for the past 2 months: Single Family Home                                      Prior Living Arrangements/Services Living arrangements for the past 2 months: Single Family Home Lives with:: Spouse, Minor Children Patient language and need for interpreter reviewed:: Yes Do you feel safe going back to the place where you live?: Yes            Criminal Activity/Legal Involvement Pertinent to Current Situation/Hospitalization: No - Comment as needed  Activities of Daily Living      Permission Sought/Granted                  Emotional Assessment Appearance:: Appears stated age Attitude/Demeanor/Rapport: Engaged Affect (typically observed): Accepting Orientation: : Oriented to Self, Oriented to Place, Oriented to  Time, Oriented to Situation   Psych Involvement: No (comment)  Admission diagnosis:  Paresthesias [R20.2] Brain mass [G93.89] Ischemic stroke (HCC) [I63.9] Focal infarction of brain [facility]) [I63.9] Patient Active Problem List   Diagnosis Date Noted   Asthma 08/24/2023   Ischemic stroke (HCC) 08/23/2023   Meningioma (HCC) 08/23/2023   Otitis media, serous, tm rupture, right  08/18/2023   Vertigo 08/18/2023   Gastroesophageal reflux disease without esophagitis 03/20/2023   Night sweats 03/20/2023   Other bursal cyst, left ankle and foot 07/06/2022   Elevated BP without diagnosis of hypertension 07/23/2019   Migraine without aura and without status migrainosus, not intractable 11/02/2018   Depression with anxiety 11/13/2015   IBS 10/30/2009   Asthma, chronic 10/26/2009   PCP:  Tommie Sams, DO Pharmacy:   [facility] [address] [place] [address] Phone: [phone] Fax: [phone]  [facility] - [place], [place] - [address] [place] [address] Phone: [phone] Fax: [phone]  [facility] - [place], [place] - [address], [address] [place] [address] Phone: [phone] Fax: [phone]  [id number] - [employer], [place] - [address] AT [facility] [address] [place] [address] Phone: [phone] Fax: [phone]     Social Drivers of Health (SDOH) Social History: SDOH Screenings   Food Insecurity: No Food Insecurity (08/25/2023)  Housing: Low Risk  (08/25/2023)  Transportation Needs: No Transportation Needs (08/25/2023)  Utilities: Not At Risk (08/25/2023)  Alcohol Screen: Low Risk  (11/18/2022)  Depression (PHQ2-9): Medium Risk (08/18/2023)  Financial Resource Strain: Low Risk  (08/18/2023)  Physical Activity: Inactive (08/18/2023)  Social Connections: Socially Integrated (08/18/2023)  Stress: Stress Concern Present (08/18/2023)  Tobacco Use: Low Risk  (08/23/2023)   SDOH Interventions:     Readmission Risk Interventions     No data to display

## 2023-08-25 NOTE — Progress Notes (Signed)
 OT Screen   Patient Details Name: Brianna Griffin MRN: [id number] DOB: 03/11/1986   Cancelled Treatment:    Reason Eval/Treat Not Completed: OT screened, no needs identified, will sign off  Malachi Bonds 08/25/2023, 10:14 AM Hal Neer OTR/L

## 2023-08-25 NOTE — Progress Notes (Signed)
 VASCULAR LAB    TCD bubble study has been performed.  See CV proc for preliminary results.   Boyd Buffalo, RVT 08/25/2023, 5:15 PM

## 2023-08-25 NOTE — Plan of Care (Signed)
  Problem: Education: Goal: Knowledge of disease or condition will improve Outcome: Progressing   Problem: Ischemic Stroke/TIA Tissue Perfusion: Goal: Complications of ischemic stroke/TIA will be minimized Outcome: Progressing   Problem: Coping: Goal: Will verbalize positive feelings about self Outcome: Progressing Goal: Will identify appropriate support needs Outcome: Progressing   Problem: Health Behavior/Discharge Planning: Goal: Goals will be collaboratively established with patient/family Outcome: Progressing   Problem: Education: Goal: Knowledge of General Education information will improve Description: Including pain rating scale, medication(s)/side effects and non-pharmacologic comfort measures Outcome: Progressing   Problem: Coping: Goal: Level of anxiety will decrease Outcome: Progressing   Problem: Elimination: Goal: Will not experience complications related to urinary retention Outcome: Progressing   Problem: Skin Integrity: Goal: Risk for impaired skin integrity will decrease Outcome: Progressing

## 2023-08-25 NOTE — Progress Notes (Signed)
 PROGRESS NOTE   Brianna Griffin  AOZ:[id number]    DOB: 25-Dec-1985    DOA: 08/23/2023  PCP: Brianna Sams, DO   I have briefly reviewed patients previous medical records in [facility].  Chief Complaint  Patient presents with   Numbness    Brief Hospital Course:  38 year old married female, works as a Animal nutritionist with [employer], medical history significant for anxiety/depression, GERD, IBS, migraine headaches, miscarriages x 3, initially presented to the [facility] with complaints of headache x 1 month duration and right-sided numbness with difficulty ambulation. Workup revealed acute ischemic stroke and an incidental meningioma.  Patient was transferred to [facility] for further evaluation by neurology/stroke team.    Assessment & Plan:  Principal Problem:   Ischemic stroke [facility]) Active Problems:   Depression with anxiety   Gastroesophageal reflux disease without esophagitis   Meningioma (HCC)   Asthma   Migraine without aura and without status migrainosus, not intractable   Acute ischemic stroke/left thalamic stroke Presented with right-sided numbness MRI brain: 5 mm acute nonhemorrhagic infarct of the lateral thalamus. CTA Head and Neck: No significant stenosis, aneurysm, or branch vessel occlusion within the anterior circulation. Moderate proximal left P2 segment stenosis. Normal CTA of the neck. 2D echo: LVEF 60-65% LDL 110, A1c 4.5 Urine pregnancy test negative.  ESR and CRP negative.  HIV screen negative.  Antithrombin activity normal.  UDS negative. Pending labs (hypercoagulable workup): Cardiolipin antibodies, prothrombin gene mutation, factor V Leyden, serum homocysteine, beta-2 glycoprotein, lupus anticoagulant, protein C&S.  These can be followed as outpatient. PT and OT did not see any home needs and signed off Neurology consultation and stroke team follow-up appreciated.  Discussed with Brianna Griffin.  Plan for TCD bubble study this afternoon and TEE with cardiology on  4/8.  If TEE negative for PFO, then consider 30-day cardiac monitor or loop recorder. Not on antithrombotics PTA, now on aspirin 81 Mg daily + clopidogrel 75 Mg daily x 3 weeks and then aspirin 81 Mg daily alone. Outpatient neurology follow-up clinically improved.  Dyslipidemia LDL 110, goal <70 Started on atorvastatin 40 mg daily, continue at discharge  Meningioma:  MRI Brain: 14 x 12 x 15 mm dural based enhancing mass lesion at the right CP angle consistent with a meningioma. CTA Head: 12 x 10 x 13 mm enhancing dural-based mass lesion at the porous acusticus and extending inferiorly. This most likely represents meningioma. A vestibular schwannoma is also considered. As per note by Neurosurgery on 08/23/2023, patient can follow-up with neurosurgery as outpatient for her meningioma.  They did not think that her presenting symptoms were related to her meningioma.  Cervicogenic headache//history of migraine headaches Patient reports that she has gotten good relief from topical menthol spray to neck/back of her head recommended by neurology  GERD: Continue home H2 blocker  Anxiety and depression Continue Wellbutrin and sertraline, home meds  Body mass index is 26.61 kg/m.    DVT prophylaxis: enoxaparin (LOVENOX) injection 40 mg Start: 08/24/23 2200     Code Status: Full Code:  Family Communication: Brianna Griffin at bedside Disposition:  Status is: Inpatient Remains inpatient appropriate because: Pending completion of stroke workup as noted above.     Consultants:   Neurology/stroke team Cardiology for TEE  Procedures:     Antimicrobials:      Subjective:  Feels much better.  Now apart from minimal numbness on the ulnar aspect of her right hand, no further strokelike symptoms.  Also indicates that the headache has  improved after the menthol spray to her neck and upper back.  No other complaints reported.  Objective:   Vitals:   08/24/23 2022 08/24/23 2346 08/25/23 0407  08/25/23 0817  BP: 112/75 126/78 (!) 123/93 124/78  Pulse: 93 (!) 103 (!) 110 94  Resp:    18  Temp: 98.1 F (36.7 C) 98 F (36.7 C) 98.2 F (36.8 C) 98.1 F (36.7 C)  TempSrc: Oral Oral Oral Oral  SpO2: 98% 99% 100% 99%  Weight:      Height:        General exam: Young female, moderately built and nourished sitting up comfortably in bed without stress. Respiratory system: Clear to auscultation. Respiratory effort normal. Cardiovascular system: S1 & S2 heard, RRR. No JVD, murmurs, rubs, gallops or clicks. No pedal edema.  Telemetry personally reviewed: Sinus rhythm. Gastrointestinal system: Abdomen is nondistended, soft and nontender. No organomegaly or masses felt. Normal bowel sounds heard. Central nervous system: Alert and oriented. No focal neurological deficits.  Mild blunting of sensation along the ulnar aspect of the right hand/little finger, compared to the opposite side. Extremities: Symmetric 5 x 5 power. Skin: No rashes, lesions or ulcers Psychiatry: Judgement and insight appear normal. Mood & affect appropriate.     Data Reviewed:   I have personally reviewed following labs and imaging studies   CBC: Recent Labs  Lab 08/23/23 1051  WBC 6.9  NEUTROABS 5.1  HGB 13.4  HCT 40.3  MCV 88.2  PLT 204    Basic Metabolic Panel: Recent Labs  Lab 08/23/23 1051  NA 136  K 4.0  CL 103  CO2 24  GLUCOSE 96  BUN 9  CREATININE 0.75  CALCIUM 9.4    Liver Function Tests: No results for input(s): "AST", "ALT", "ALKPHOS", "BILITOT", "PROT", "ALBUMIN" in the last 168 hours.  CBG: No results for input(s): "GLUCAP" in the last 168 hours.  Microbiology Studies:  No results found for this or any previous visit (from the past 240 hours).  Radiology Studies:  ECHOCARDIOGRAM COMPLETE Result Date: 08/24/2023    ECHOCARDIOGRAM REPORT   Patient Name:   Brianna Griffin Date of Exam: 08/24/2023 Medical Rec #:  [id number]       Height:       64.0 in Accession #:    [id number]       Weight:       155.0 lb Date of Birth:  [birth year]       BSA:          1.756 m Patient Age:    38 years        BP:           123/95 mmHg Patient Gender: F               HR:           96 bpm. [facility] Procedure: 2D Echo, Cardiac Doppler and Color Doppler (Both Spectral and Color            Flow Doppler were utilized during procedure). Indications:    Stroke  History:        Patient has no prior history of Echocardiogram examinations.  Sonographer:    Sheralyn Boatman RDCS Referring Phys: [phone] JACOB J STINSON  Sonographer Comments: Suboptimal parasternal window. IMPRESSIONS  1. Left ventricular ejection fraction, by estimation, is 60 to 65%. Left ventricular ejection fraction by 2D MOD biplane is 60.5 %. The left ventricle has normal function. The left ventricle has  no regional wall motion abnormalities. Left ventricular diastolic parameters were normal.  2. Right ventricular systolic function is normal. The right ventricular size is normal. Tricuspid regurgitation signal is inadequate for assessing PA pressure.  3. The mitral valve is normal in structure. No evidence of mitral valve regurgitation.  4. The aortic valve is tricuspid. Aortic valve regurgitation is not visualized.  5. The inferior vena cava is normal in size with greater than 50% respiratory variability, suggesting right atrial pressure of 3 mmHg. Comparison(s): No prior Echocardiogram. Conclusion(s)/Recommendation(s): Normal biventricular function without evidence of hemodynamically significant valvular heart disease. FINDINGS  Left Ventricle: Left ventricular ejection fraction, by estimation, is 60 to 65%. Left ventricular ejection fraction by 2D MOD biplane is 60.5 %. The left ventricle has normal function. The left ventricle has no regional wall motion abnormalities. The left ventricular internal cavity size was normal in size. There is no left ventricular hypertrophy. Left ventricular diastolic parameters were normal. Right Ventricle:  The right ventricular size is normal. No increase in right ventricular wall thickness. Right ventricular systolic function is normal. Tricuspid regurgitation signal is inadequate for assessing PA pressure. Left Atrium: Left atrial size was normal in size. Right Atrium: Right atrial size was normal in size. Pericardium: There is no evidence of pericardial effusion. Mitral Valve: The mitral valve is normal in structure. No evidence of mitral valve regurgitation. Tricuspid Valve: The tricuspid valve is normal in structure. Tricuspid valve regurgitation is not demonstrated. Aortic Valve: The aortic valve is tricuspid. Aortic valve regurgitation is not visualized. Pulmonic Valve: The pulmonic valve was normal in structure. Pulmonic valve regurgitation is not visualized. Aorta: The aortic root and ascending aorta are structurally normal, with no evidence of dilitation. Venous: The inferior vena cava is normal in size with greater than 50% respiratory variability, suggesting right atrial pressure of 3 mmHg. IAS/Shunts: No atrial level shunt detected by color flow Doppler.  LEFT VENTRICLE PLAX 2D                        Biplane EF (MOD) LVIDd:         3.80 cm         LV Biplane EF:   Left LVIDs:         2.80 cm                          ventricular LV PW:         0.70 cm                          ejection LV IVS:        0.80 cm                          fraction by LVOT diam:     1.90 cm                          2D MOD LV SV:         41                               biplane is LV SV Index:   23  60.5 %. LVOT Area:     2.84 cm                                Diastology                                LV e' medial:    10.10 cm/s LV Volumes (MOD)               LV E/e' medial:  5.5 LV vol d, MOD    65.8 ml       LV e' lateral:   12.50 cm/s A2C:                           LV E/e' lateral: 4.5 LV vol d, MOD    38.6 ml A4C: LV vol s, MOD    24.8 ml A2C: LV vol s, MOD    16.2 ml A4C: LV SV MOD A2C:   41.0 ml  LV SV MOD A4C:   38.6 ml LV SV MOD BP:    32.3 ml RIGHT VENTRICLE             IVC RV S prime:     10.40 cm/s  IVC diam: 1.60 cm TAPSE (M-mode): 1.2 cm LEFT ATRIUM             Index       RIGHT ATRIUM          Index LA diam:        2.00 cm 1.14 cm/m  RA Area:     9.07 cm LA Vol (A2C):   13.6 ml 7.75 ml/m  RA Volume:   16.20 ml 9.23 ml/m LA Vol (A4C):   12.9 ml 7.35 ml/m LA Biplane Vol: 14.1 ml 8.03 ml/m  AORTIC VALVE LVOT Vmax:   93.50 cm/s LVOT Vmean:  61.300 cm/s LVOT VTI:    0.143 m  AORTA Ao Root diam: 2.80 cm MITRAL VALVE MV Area (PHT): 4.25 cm    SHUNTS MV Decel Time: 179 msec    Systemic VTI:  0.14 m MV E velocity: 55.77 cm/s  Systemic Diam: 1.90 cm MV A velocity: 62.33 cm/s MV E/A ratio:  0.89 Zoila Shutter MD Electronically signed by Zoila Shutter MD Signature Date/Time: 08/24/2023/5:45:26 PM    Final    MR Brain W and Wo Contrast Result Date: 08/23/2023 CLINICAL DATA:  Headache.  Intermittent right-sided numbness. EXAM: MRI HEAD WITHOUT AND WITH CONTRAST TECHNIQUE: Multiplanar, multiecho pulse sequences of the brain and surrounding structures were obtained without and with intravenous contrast. CONTRAST:  7mL GADAVIST GADOBUTROL 1 MMOL/ML IV SOLN COMPARISON:  CT angio head and neck 08/23/2023 FINDINGS: Brain: A 5 mm acute nonhemorrhagic infarct is present in the lateral left thalamus. T2 and FLAIR hyperintensity is associated. No other significant scratched at no significant white matter disease is present. Deep brain nuclei are within normal limits. The ventricles are of normal size. No significant extraaxial fluid collection is present. A dural based enhancing mass lesion at the right CP angle measures 14 x 12 x 15 mm. This sits along the anterior aspect of the porous acoustic [place] but does not enter the IAC. The seventh and eighth cranial nerves are separate from the lesion. The left ICA is within normal limits. Vascular: Flow is present in the major intracranial arteries.  Skull and upper cervical  spine: The craniocervical junction is normal. Upper cervical spine is within normal limits. Marrow signal is unremarkable. Sinuses/Orbits: A right mastoid effusion is present. No obstructing nasopharyngeal lesion is present. The paranasal sinuses and mastoid air cells are otherwise clear. The globes and orbits are within normal limits. IMPRESSION: 1. 5 mm acute nonhemorrhagic infarct of the lateral left thalamus. 2. 14 x 12 x 15 mm dural based enhancing mass lesion at the right CP angle consistent with a meningioma. 3. Right mastoid effusion. No obstructing nasopharyngeal lesion is present. These results were called by telephone at the time of interpretation on 08/23/2023 at 4:08 pm to provider [facility]TT , who verbally acknowledged these results. Electronically Signed   By: Marin Roberts M.D.   On: 08/23/2023 16:17    Scheduled Meds:    aspirin EC  81 mg Oral Q breakfast   atorvastatin  40 mg Oral Daily   buPROPion  300 mg Oral Daily   clopidogrel  75 mg Oral Daily   enoxaparin (LOVENOX) injection  40 mg Subcutaneous Q24H   famotidine  20 mg Oral BID   sertraline  100 mg Oral Daily    Continuous Infusions:     LOS: 2 days     Marcellus Scott, MD,  FACP, [facility], [facility], [facility]   [facility] [place]      To contact the attending provider between 7A-7P or the covering provider during after hours 7P-7A, please log into the web site [email or web address] and access using universal [place] password for that web site. If you do not have the password, please call the hospital operator.  08/25/2023, 12:58 PM

## 2023-08-25 NOTE — H&P (View-Only) (Signed)
 PROGRESS NOTE   Brianna Griffin  AOZ:[id number]    DOB: 25-Dec-1985    DOA: 08/23/2023  PCP: Brianna Sams, DO   I have briefly reviewed patients previous medical records in [facility].  Chief Complaint  Patient presents with   Numbness    Brief Hospital Course:  38 year old married female, works as a Animal nutritionist with [employer], medical history significant for anxiety/depression, GERD, IBS, migraine headaches, miscarriages x 3, initially presented to the [facility] with complaints of headache x 1 month duration and right-sided numbness with difficulty ambulation. Workup revealed acute ischemic stroke and an incidental meningioma.  Patient was transferred to [facility] for further evaluation by neurology/stroke team.    Assessment & Plan:  Principal Problem:   Ischemic stroke [facility]) Active Problems:   Depression with anxiety   Gastroesophageal reflux disease without esophagitis   Meningioma (HCC)   Asthma   Migraine without aura and without status migrainosus, not intractable   Acute ischemic stroke/left thalamic stroke Presented with right-sided numbness MRI brain: 5 mm acute nonhemorrhagic infarct of the lateral thalamus. CTA Head and Neck: No significant stenosis, aneurysm, or branch vessel occlusion within the anterior circulation. Moderate proximal left P2 segment stenosis. Normal CTA of the neck. 2D echo: LVEF 60-65% LDL 110, A1c 4.5 Urine pregnancy test negative.  ESR and CRP negative.  HIV screen negative.  Antithrombin activity normal.  UDS negative. Pending labs (hypercoagulable workup): Cardiolipin antibodies, prothrombin gene mutation, factor V Leyden, serum homocysteine, beta-2 glycoprotein, lupus anticoagulant, protein C&S.  These can be followed as outpatient. PT and OT did not see any home needs and signed off Neurology consultation and stroke team follow-up appreciated.  Discussed with Dr. Pearlean Griffin.  Plan for TCD bubble study this afternoon and TEE with cardiology on  4/8.  If TEE negative for PFO, then consider 30-day cardiac monitor or loop recorder. Not on antithrombotics PTA, now on aspirin 81 Mg daily + clopidogrel 75 Mg daily x 3 weeks and then aspirin 81 Mg daily alone. Outpatient neurology follow-up clinically improved.  Dyslipidemia LDL 110, goal <70 Started on atorvastatin 40 mg daily, continue at discharge  Meningioma:  MRI Brain: 14 x 12 x 15 mm dural based enhancing mass lesion at the right CP angle consistent with a meningioma. CTA Head: 12 x 10 x 13 mm enhancing dural-based mass lesion at the porous acusticus and extending inferiorly. This most likely represents meningioma. A vestibular schwannoma is also considered. As per note by Neurosurgery on 08/23/2023, patient can follow-up with neurosurgery as outpatient for her meningioma.  They did not think that her presenting symptoms were related to her meningioma.  Cervicogenic headache//history of migraine headaches Patient reports that she has gotten good relief from topical menthol spray to neck/back of her head recommended by neurology  GERD: Continue home H2 blocker  Anxiety and depression Continue Wellbutrin and sertraline, home meds  Body mass index is 26.61 kg/m.    DVT prophylaxis: enoxaparin (LOVENOX) injection 40 mg Start: 08/24/23 2200     Code Status: Full Code:  Family Communication: Spouse at bedside Disposition:  Status is: Inpatient Remains inpatient appropriate because: Pending completion of stroke workup as noted above.     Consultants:   Neurology/stroke team Cardiology for TEE  Procedures:     Antimicrobials:      Subjective:  Feels much better.  Now apart from minimal numbness on the ulnar aspect of her right hand, no further strokelike symptoms.  Also indicates that the headache has  improved after the menthol spray to her neck and upper back.  No other complaints reported.  Objective:   Vitals:   08/24/23 2022 08/24/23 2346 08/25/23 0407  08/25/23 0817  BP: 112/75 126/78 (!) 123/93 124/78  Pulse: 93 (!) 103 (!) 110 94  Resp:    18  Temp: 98.1 F (36.7 C) 98 F (36.7 C) 98.2 F (36.8 C) 98.1 F (36.7 C)  TempSrc: Oral Oral Oral Oral  SpO2: 98% 99% 100% 99%  Weight:      Height:        General exam: Young female, moderately built and nourished sitting up comfortably in bed without stress. Respiratory system: Clear to auscultation. Respiratory effort normal. Cardiovascular system: S1 & S2 heard, RRR. No JVD, murmurs, rubs, gallops or clicks. No pedal edema.  Telemetry personally reviewed: Sinus rhythm. Gastrointestinal system: Abdomen is nondistended, soft and nontender. No organomegaly or masses felt. Normal bowel sounds heard. Central nervous system: Alert and oriented. No focal neurological deficits.  Mild blunting of sensation along the ulnar aspect of the right hand/little finger, compared to the opposite side. Extremities: Symmetric 5 x 5 power. Skin: No rashes, lesions or ulcers Psychiatry: Judgement and insight appear normal. Mood & affect appropriate.     Data Reviewed:   I have personally reviewed following labs and imaging studies   CBC: Recent Labs  Lab 08/23/23 1051  WBC 6.9  NEUTROABS 5.1  HGB 13.4  HCT 40.3  MCV 88.2  PLT 204    Basic Metabolic Panel: Recent Labs  Lab 08/23/23 1051  NA 136  K 4.0  CL 103  CO2 24  GLUCOSE 96  BUN 9  CREATININE 0.75  CALCIUM 9.4    Liver Function Tests: No results for input(s): "AST", "ALT", "ALKPHOS", "BILITOT", "PROT", "ALBUMIN" in the last 168 hours.  CBG: No results for input(s): "GLUCAP" in the last 168 hours.  Microbiology Studies:  No results found for this or any previous visit (from the past 240 hours).  Radiology Studies:  ECHOCARDIOGRAM COMPLETE Result Date: 08/24/2023    ECHOCARDIOGRAM REPORT   Patient Name:   Brianna Griffin Date of Exam: 08/24/2023 Medical Rec #:  [id number]       Height:       64.0 in Accession #:    [id number]       Weight:       155.0 lb Date of Birth:  [birth year]       BSA:          1.756 m Patient Age:    37 years        BP:           123/95 mmHg Patient Gender: F               HR:           96 bpm. [facility] Procedure: 2D Echo, Cardiac Doppler and Color Doppler (Both Spectral and Color            Flow Doppler were utilized during procedure). Indications:    Stroke  History:        Patient has no prior history of Echocardiogram examinations.  Sonographer:    Sheralyn Boatman RDCS Referring Phys: [phone] JACOB J STINSON  Sonographer Comments: Suboptimal parasternal window. IMPRESSIONS  1. Left ventricular ejection fraction, by estimation, is 60 to 65%. Left ventricular ejection fraction by 2D MOD biplane is 60.5 %. The left ventricle has normal function. The left ventricle has  no regional wall motion abnormalities. Left ventricular diastolic parameters were normal.  2. Right ventricular systolic function is normal. The right ventricular size is normal. Tricuspid regurgitation signal is inadequate for assessing PA pressure.  3. The mitral valve is normal in structure. No evidence of mitral valve regurgitation.  4. The aortic valve is tricuspid. Aortic valve regurgitation is not visualized.  5. The inferior vena cava is normal in size with greater than 50% respiratory variability, suggesting right atrial pressure of 3 mmHg. Comparison(s): No prior Echocardiogram. Conclusion(s)/Recommendation(s): Normal biventricular function without evidence of hemodynamically significant valvular heart disease. FINDINGS  Left Ventricle: Left ventricular ejection fraction, by estimation, is 60 to 65%. Left ventricular ejection fraction by 2D MOD biplane is 60.5 %. The left ventricle has normal function. The left ventricle has no regional wall motion abnormalities. The left ventricular internal cavity size was normal in size. There is no left ventricular hypertrophy. Left ventricular diastolic parameters were normal. Right Ventricle:  The right ventricular size is normal. No increase in right ventricular wall thickness. Right ventricular systolic function is normal. Tricuspid regurgitation signal is inadequate for assessing PA pressure. Left Atrium: Left atrial size was normal in size. Right Atrium: Right atrial size was normal in size. Pericardium: There is no evidence of pericardial effusion. Mitral Valve: The mitral valve is normal in structure. No evidence of mitral valve regurgitation. Tricuspid Valve: The tricuspid valve is normal in structure. Tricuspid valve regurgitation is not demonstrated. Aortic Valve: The aortic valve is tricuspid. Aortic valve regurgitation is not visualized. Pulmonic Valve: The pulmonic valve was normal in structure. Pulmonic valve regurgitation is not visualized. Aorta: The aortic root and ascending aorta are structurally normal, with no evidence of dilitation. Venous: The inferior vena cava is normal in size with greater than 50% respiratory variability, suggesting right atrial pressure of 3 mmHg. IAS/Shunts: No atrial level shunt detected by color flow Doppler.  LEFT VENTRICLE PLAX 2D                        Biplane EF (MOD) LVIDd:         3.80 cm         LV Biplane EF:   Left LVIDs:         2.80 cm                          ventricular LV PW:         0.70 cm                          ejection LV IVS:        0.80 cm                          fraction by LVOT diam:     1.90 cm                          2D MOD LV SV:         41                               biplane is LV SV Index:   23  60.5 %. LVOT Area:     2.84 cm                                Diastology                                LV e' medial:    10.10 cm/s LV Volumes (MOD)               LV E/e' medial:  5.5 LV vol d, MOD    65.8 ml       LV e' lateral:   12.50 cm/s A2C:                           LV E/e' lateral: 4.5 LV vol d, MOD    38.6 ml A4C: LV vol s, MOD    24.8 ml A2C: LV vol s, MOD    16.2 ml A4C: LV SV MOD A2C:   41.0 ml  LV SV MOD A4C:   38.6 ml LV SV MOD BP:    32.3 ml RIGHT VENTRICLE             IVC RV S prime:     10.40 cm/s  IVC diam: 1.60 cm TAPSE (M-mode): 1.2 cm LEFT ATRIUM             Index       RIGHT ATRIUM          Index LA diam:        2.00 cm 1.14 cm/m  RA Area:     9.07 cm LA Vol (A2C):   13.6 ml 7.75 ml/m  RA Volume:   16.20 ml 9.23 ml/m LA Vol (A4C):   12.9 ml 7.35 ml/m LA Biplane Vol: 14.1 ml 8.03 ml/m  AORTIC VALVE LVOT Vmax:   93.50 cm/s LVOT Vmean:  61.300 cm/s LVOT VTI:    0.143 m  AORTA Ao Root diam: 2.80 cm MITRAL VALVE MV Area (PHT): 4.25 cm    SHUNTS MV Decel Time: 179 msec    Systemic VTI:  0.14 m MV E velocity: 55.77 cm/s  Systemic Diam: 1.90 cm MV A velocity: 62.33 cm/s MV E/A ratio:  0.89 Zoila Shutter MD Electronically signed by Zoila Shutter MD Signature Date/Time: 08/24/2023/5:45:26 PM    Final    MR Brain W and Wo Contrast Result Date: 08/23/2023 CLINICAL DATA:  Headache.  Intermittent right-sided numbness. EXAM: MRI HEAD WITHOUT AND WITH CONTRAST TECHNIQUE: Multiplanar, multiecho pulse sequences of the brain and surrounding structures were obtained without and with intravenous contrast. CONTRAST:  7mL GADAVIST GADOBUTROL 1 MMOL/ML IV SOLN COMPARISON:  CT angio head and neck 08/23/2023 FINDINGS: Brain: A 5 mm acute nonhemorrhagic infarct is present in the lateral left thalamus. T2 and FLAIR hyperintensity is associated. No other significant scratched at no significant white matter disease is present. Deep brain nuclei are within normal limits. The ventricles are of normal size. No significant extraaxial fluid collection is present. A dural based enhancing mass lesion at the right CP angle measures 14 x 12 x 15 mm. This sits along the anterior aspect of the porous acoustic [place] but does not enter the IAC. The seventh and eighth cranial nerves are separate from the lesion. The left ICA is within normal limits. Vascular: Flow is present in the major intracranial arteries.  Skull and upper cervical  spine: The craniocervical junction is normal. Upper cervical spine is within normal limits. Marrow signal is unremarkable. Sinuses/Orbits: A right mastoid effusion is present. No obstructing nasopharyngeal lesion is present. The paranasal sinuses and mastoid air cells are otherwise clear. The globes and orbits are within normal limits. IMPRESSION: 1. 5 mm acute nonhemorrhagic infarct of the lateral left thalamus. 2. 14 x 12 x 15 mm dural based enhancing mass lesion at the right CP angle consistent with a meningioma. 3. Right mastoid effusion. No obstructing nasopharyngeal lesion is present. These results were called by telephone at the time of interpretation on 08/23/2023 at 4:08 pm to provider [facility] TRIPLETT , who verbally acknowledged these results. Electronically Signed   By: Marin Roberts M.D.   On: 08/23/2023 16:17    Scheduled Meds:    aspirin EC  81 mg Oral Q breakfast   atorvastatin  40 mg Oral Daily   buPROPion  300 mg Oral Daily   clopidogrel  75 mg Oral Daily   enoxaparin (LOVENOX) injection  40 mg Subcutaneous Q24H   famotidine  20 mg Oral BID   sertraline  100 mg Oral Daily    Continuous Infusions:     LOS: 2 days     Marcellus Scott, MD,  FACP, [facility], [facility], [facility]   [facility] [place]      To contact the attending provider between 7A-7P or the covering provider during after hours 7P-7A, please log into the web site [email or web address] and access using universal [place] password for that web site. If you do not have the password, please call the hospital operator.  08/25/2023, 12:58 PM

## 2023-08-25 NOTE — Plan of Care (Signed)
Compliant with care plan

## 2023-08-25 NOTE — Progress Notes (Signed)
 VASCULAR LAB    Bilateral lower extremity venous duplex has been performed.  See CV proc for preliminary results.   Andrius Andrepont, RVT 08/25/2023, 5:16 PM

## 2023-08-25 NOTE — Progress Notes (Addendum)
 At around 1640 patient called and stated she had numbness in her right side of the face and arms, and had blurry vision for a while, informed MD, CBG 118, BP 148/89, MD and NP on bedside evaluating, IVF NS bolus given and patient kept flat in bed, HR was in 120s, and was anxious, Ativan PO given, will continue to monitor.  1800: Patient stated numbness has improved, will continue to monitor

## 2023-08-25 NOTE — Progress Notes (Addendum)
 STROKE TEAM PROGRESS NOTE   INTERIM HISTORY/SUBJECTIVE Patient has remained hemodynamically stable and afebrile.  She states that her right-sided numbness has improved and is amenable to TEE tomorrow. MRI scan of the brain shows a subacute left thalamic lacunar infarct.  MRI also shows a 14 x 12 mm enhancing right CP angle mass possibly meningioma which is asymptomatic.  CT angiogram shows no large vessel stenosis or occlusion. She denies any history of DVT, pulmonary embolism, recurrent miscarriages or family history of strokes or heart attacks at a young age.  She denies doing illicit drugs or smoking cigarettes alcohol. OBJECTIVE  CBC    Component Value Date/Time   WBC 6.9 08/23/2023 1051   RBC 4.57 08/23/2023 1051   HGB 13.4 08/23/2023 1051   HGB 14.1 10/12/2021 0810   HCT 40.3 08/23/2023 1051   HCT 40.0 10/12/2021 0810   PLT 204 08/23/2023 1051   PLT 262 10/12/2021 0810   MCV 88.2 08/23/2023 1051   MCV 88 10/12/2021 0810   MCH 29.3 08/23/2023 1051   MCHC 33.3 08/23/2023 1051   RDW 13.4 08/23/2023 1051   RDW 12.7 10/12/2021 0810   LYMPHSABS 1.4 08/23/2023 1051   LYMPHSABS 1.9 06/14/2020 1651   MONOABS 0.3 08/23/2023 1051   EOSABS 0.1 08/23/2023 1051   EOSABS 0.1 06/14/2020 1651   BASOSABS 0.0 08/23/2023 1051   BASOSABS 0.0 06/14/2020 1651    BMET    Component Value Date/Time   NA 136 08/23/2023 1051   NA 139 10/12/2021 0810   K 4.0 08/23/2023 1051   CL 103 08/23/2023 1051   CO2 24 08/23/2023 1051   GLUCOSE 96 08/23/2023 1051   BUN 9 08/23/2023 1051   BUN 11 10/12/2021 0810   CREATININE 0.75 08/23/2023 1051   CALCIUM 9.4 08/23/2023 1051   EGFR 72 10/12/2021 0810   GFRNONAA >60 08/23/2023 1051    IMAGING past 24 hours No results found.  Vitals:   08/24/23 2022 08/24/23 2346 08/25/23 0407 08/25/23 0817  BP: 112/75 126/78 (!) 123/93 124/78  Pulse: 93 (!) 103 (!) 110 94  Resp:    18  Temp: 98.1 F (36.7 C) 98 F (36.7 C) 98.2 F (36.8 C) 98.1 F (36.7 C)   TempSrc: Oral Oral Oral Oral  SpO2: 98% 99% 100% 99%  Weight:      Height:         PHYSICAL EXAM General:  Alert, well-nourished, well-developed young Caucasian lady t in no acute distress Psych:  Mood and affect appropriate for situation CV: Regular rate and rhythm on monitor Respiratory:  Regular, unlabored respirations on room air   NEURO:  Mental Status: AA&Ox3, patient is able to give clear and coherent history Speech/Language: speech is without dysarthria or aphasia.    Cranial Nerves:  II: PERRL.  III, IV, VI: EOMI. Eyelids elevate symmetrically.  V: Sensation is intact to light touch and symmetrical to face.  VII: Face is symmetrical resting and smiling VIII: hearing intact to voice. IX, X: Phonation is normal.  EA:[id number] shrug 5/5. XII: tongue is midline without fasciculations. Motor: 5/5 strength to all muscle groups tested.  Tone: is normal and bulk is normal Sensation- Intact to light touch bilaterally but diminished in the right upper extremity, sensation intact in the right lower extremity Coordination: FTN intact bilaterally Gait- deferred  Most Recent NIH  1a Level of Conscious.: 0 1b LOC Questions: 0 1c LOC Commands: 0 2 Best Gaze: 0 3 Visual: 0 4 Facial Palsy: 0 5a Motor  Arm - left: 0 5b Motor Arm - Right: 0 6a Motor Leg - Left: 0 6b Motor Leg - Right: 0 7 Limb Ataxia: 0 8 Sensory: 1 9 Best Language: 0 10 Dysarthria: 0 11 Extinct. and Inatten.: 0 TOTAL: 1   ASSESSMENT/PLAN  Brianna Griffin is a 38 y.o. female with history of GERD, migraines, miscarriage, anxiety and depression admitted for sudden onset right sided numbness and difficulty with ambulation.  Patient reports having a headache at the time this started but states it did not feel like her typical migraine.  MRI brain shows small left thalamic stroke.  Patient verbalizes that her right-sided numbness has improved.  NIH on Admission 0  Acute Ischemic Infarct:  left thalamic  stroke Etiology: Cryptogenic, undergoing stroke in young workup CT head 12 x 10 x 13 mm enhancing dural based mass at porous acoustic [place] and extending inferiorly, likely representing meningioma CTA head & neck no LVO or hemodynamically significant stenosis MRI 5 mm nonhemorrhagic infarct in lateral left thalamus, 14 x 12 x 15 mm dural based enhancing mass lesion at right CP angle consistent with a meningioma 2D Echo EF 60 to 65%, normal left atrial size, no atrial level shunt TEE pending Will consider 30-day cardiac monitor or loop recorder if TEE negative for PFO LDL 110 HgbA1c 4.5 Hypercoagulable workup pending, will follow-up as outpatient VTE prophylaxis -Lovenox No antithrombotic prior to admission, now on aspirin 81 mg daily and clopidogrel 75 mg daily for 3 weeks and then aspirin alone. Therapy recommendations:  Pending Disposition: Pending, likely home  Hypertension Home meds: None Stable Blood Pressure Goal: BP less than 220/110   Hyperlipidemia Home meds: None LDL 110, goal < 70 Add atorvastatin 40 mg daily Continue statin at discharge   Other Stroke Risk Factors Migraines   Other Active Problems 14 x 12 mm right CP angle enhancing dural based mass possibly meningioma which will need outpatient neurosurgery follow-up  Hospital day # 2  Patient seen by NP with MD, MD to add note as needed. Cortney E Ernestina Columbia , MSN, AGACNP-BC [facility] See Amion for schedule and pager information 08/25/2023 12:24 PM   I have personally obtained history,examined this patient, reviewed notes, independently viewed imaging studies, participated in medical decision making and plan of care.ROS completed by me personally and pertinent positives fully documented  I have made any additions or clarifications directly to the above note. Agree with note above.  38 year old Caucasian lady with no significant risk factor for strokes presented with 2 days of right-sided paresthesias  secondary to left thalamic infarct likely from small vessel disease.  Recommend check TCD bubble study for PFO TEE, continue cardiac monitoring and ongoing stroke workup.  Check ANA panel, anticardiolipin antibodies.  Aspirin and Plavix for 3 weeks followed by aspirin alone and aggressive risk factor modification.  Patient appears to be at risk for obstructive sleep apnea and was offered a chance to consider participation in the sleep smart study but after reviewing information she decided against participation.  May consider outpatient evaluation for sleep apnea if agreeable later.  She also has a 14 x 12 mm enhancing right CP angle mass possibly meningioma for which she will need outpatient neurosurgery referral.  Long discussion with patient and husband and answered questions. Greater than 50% time during this 50-minute visit was spent in counseling and coordination of care and discussion patient care team and answering questions. Delia Heady, MD Medical Director [facility] Stroke Center Pager: [phone] 08/25/2023  1:43 PM  ADDENDUM : Called by RN about patient complaining of dizziness and numbness in both her hands.  She was tachycardic with heart rate in the 110 range.Marland Kitchen  She was seen by stroke nurse practitioner and felt to be anxious.  We recommend patient's lay flat in bed.  Get normal saline bolus 500 cc.  May try Xanax as needed.  Primary team notified.    Delia Heady, MD Medical Director [facility] Stroke Center Pager: [phone] 08/25/2023 5:36 PM   To contact Stroke Continuity provider, please refer to [email or web address]. After hours, contact General Neurology

## 2023-08-25 NOTE — Evaluation (Signed)
 Speech Language Pathology Evaluation Patient Details Name: Brianna Griffin MRN: [id number] DOB: June 11, 1985 Today's Date: 08/25/2023 Time: 7829-5621 SLP Time Calculation (min) (ACUTE ONLY): 8 min  Problem List:  Patient Active Problem List   Diagnosis Date Noted   Asthma 08/24/2023   Ischemic stroke (HCC) 08/23/2023   Meningioma (HCC) 08/23/2023   Otitis media, serous, tm rupture, right 08/18/2023   Vertigo 08/18/2023   Gastroesophageal reflux disease without esophagitis 03/20/2023   Night sweats 03/20/2023   Other bursal cyst, left ankle and foot 07/06/2022   Elevated BP without diagnosis of hypertension 07/23/2019   Migraine without aura and without status migrainosus, not intractable 11/02/2018   Depression with anxiety 11/13/2015   IBS 10/30/2009   Asthma, chronic 10/26/2009   Past Medical History:  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   IBS (irritable bowel syndrome)    Migraine headache    Migraines    Miscarriage 09/09/2013   Thyroid disease    hypothyroid   Vaginal Pap smear, abnormal    Past Surgical History:  Past Surgical History:  Procedure Laterality Date   CYST REMOVAL NECK     HPI:  38 year old married female, works as a Animal nutritionist with [employer], medical history significant for anxiety/depression, GERD, IBS, migraine headaches, miscarriages x 3, initially presented to the [facility] with complaints of headache x 1 month duration and right-sided numbness with difficulty ambulation. Workup revealed acute ischemic stroke and an incidental meningioma.   Assessment / Plan / Recommendation Clinical Impression  Patient presents with cognitive linguistic function that with [facility] and at baseline. Patient notes baseline ADHD and reports subsequent attention deficits  outside of stroke and endorses that no changes have been noted since stroke onset. Family corroborates all information provided. Patient scored 23/30 on SLUMS examination, however suspect attention deficits played role in lower score as many family members were present during evaluation and provided distracting background noise. No overt impairment observed in receptive/expressive language nor speech. No further ST services indicated. SLP will sign off.    SLP Assessment  SLP Recommendation/Assessment: Patient does not need any further Speech Lanaguage Pathology Services    Recommendations for follow up therapy are one component of a multi-disciplinary discharge planning process, led by the attending physician.  Recommendations may be updated based on patient status, additional functional criteria and insurance authorization.    Follow Up Recommendations  No SLP follow up    Assistance Recommended at Discharge  None  Functional Status Assessment Patient has not had a recent decline in their functional status  Frequency and Duration           SLP Evaluation Cognition  Overall Cognitive Status: Within Functional Limits for tasks assessed Arousal/Alertness: Awake/alert Orientation Level: Oriented X4 Year: 2025 Month: April Day of Week: Correct Attention: Selective Selective Attention: Impaired Selective Attention Impairment: Verbal complex Memory: Appears intact Awareness: Appears intact Problem Solving: Appears intact Safety/Judgment: Appears intact       Comprehension  Auditory Comprehension Overall Auditory Comprehension: Appears within functional limits for tasks assessed    Expression Expression Primary Mode of Expression: Verbal Verbal Expression Overall Verbal Expression: Appears within functional limits for tasks assessed   Oral / Motor  Oral Motor/Sensory Function Overall Oral Motor/Sensory Function: Within functional limits Motor Speech  Overall Motor Speech:  Appears within functional limits for tasks assessed           Jeannie Done, M.A., CCC-SLP  Yetta Barre 08/25/2023, 3:31 PM

## 2023-08-25 NOTE — Progress Notes (Signed)
   [facility] has been requested to perform a transesophageal echocardiogram on Brianna Griffin for stroke.     The patient does NOT have any absolute or relative contraindications to a Transesophageal Echocardiogram (TEE).  The patient has: No other conditions that may impact this procedure.    After careful review of history and examination, the risks and benefits of transesophageal echocardiogram have been explained including risks of esophageal damage, perforation (1:10,000 risk), bleeding, pharyngeal hematoma as well as other potential complications associated with conscious sedation including aspiration, arrhythmia, respiratory failure and death. Alternatives to treatment were discussed, questions were answered. Patient is willing to proceed.   TEE scheduled for tomorrow, NPO after midnight, check INR, orders placed.     SignedCyndi Bender, NP  08/25/2023 4:58 PM

## 2023-08-26 ENCOUNTER — Inpatient Hospital Stay (HOSPITAL_COMMUNITY): Admitting: Anesthesiology

## 2023-08-26 ENCOUNTER — Encounter (HOSPITAL_COMMUNITY): Payer: Self-pay | Admitting: Cardiology

## 2023-08-26 ENCOUNTER — Encounter (HOSPITAL_COMMUNITY)
Admission: EM | Disposition: A | Discharge status: Home / Self Care | Payer: Self-pay | Source: Home / Self Care | Attending: Family Medicine

## 2023-08-26 ENCOUNTER — Inpatient Hospital Stay (HOSPITAL_COMMUNITY)

## 2023-08-26 DIAGNOSIS — I340 Nonrheumatic mitral (valve) insufficiency: Secondary | ICD-10-CM | POA: Diagnosis not present

## 2023-08-26 DIAGNOSIS — J45909 Unspecified asthma, uncomplicated: Secondary | ICD-10-CM | POA: Diagnosis not present

## 2023-08-26 DIAGNOSIS — F418 Other specified anxiety disorders: Secondary | ICD-10-CM | POA: Diagnosis not present

## 2023-08-26 DIAGNOSIS — E785 Hyperlipidemia, unspecified: Secondary | ICD-10-CM | POA: Diagnosis not present

## 2023-08-26 DIAGNOSIS — R9089 Other abnormal findings on diagnostic imaging of central nervous system: Secondary | ICD-10-CM | POA: Diagnosis not present

## 2023-08-26 DIAGNOSIS — I6389 Other cerebral infarction: Secondary | ICD-10-CM | POA: Diagnosis not present

## 2023-08-26 DIAGNOSIS — D329 Benign neoplasm of meninges, unspecified: Secondary | ICD-10-CM | POA: Diagnosis not present

## 2023-08-26 DIAGNOSIS — G4486 Cervicogenic headache: Secondary | ICD-10-CM | POA: Diagnosis not present

## 2023-08-26 DIAGNOSIS — I639 Cerebral infarction, unspecified: Secondary | ICD-10-CM | POA: Diagnosis not present

## 2023-08-26 DIAGNOSIS — R29701 NIHSS score 1: Secondary | ICD-10-CM | POA: Diagnosis not present

## 2023-08-26 HISTORY — PX: TRANSESOPHAGEAL ECHOCARDIOGRAM (CATH LAB): EP1270

## 2023-08-26 LAB — ECHO TEE

## 2023-08-26 LAB — PROTEIN C, TOTAL: Protein C, Total: 119 % (ref 60–150)

## 2023-08-26 LAB — TSH: TSH: 3.081 u[IU]/mL (ref 0.350–4.500)

## 2023-08-26 SURGERY — TRANSESOPHAGEAL ECHOCARDIOGRAM (TEE) (CATHLAB)
Anesthesia: Monitor Anesthesia Care

## 2023-08-26 MED ORDER — SODIUM CHLORIDE 0.9% FLUSH: 3.0000 mL | Freq: Two times a day (BID) | INTRAVENOUS | Status: DC

## 2023-08-26 MED ORDER — LACTATED RINGERS IV SOLN: INTRAVENOUS | Status: DC | PRN

## 2023-08-26 MED ORDER — DIPHENHYDRAMINE HCL 50 MG/ML IJ SOLN
25.0000 mg | Freq: Once | INTRAMUSCULAR | Status: DC
  Filled 2023-08-26: qty 1

## 2023-08-26 MED ORDER — PROPOFOL 500 MG/50ML IV EMUL
INTRAVENOUS | Status: DC | PRN
  Administered 2023-08-26: 130 ug/kg/min via INTRAVENOUS

## 2023-08-26 MED ORDER — PROPOFOL 10 MG/ML IV BOLUS
INTRAVENOUS | Status: DC | PRN
  Administered 2023-08-26 (×2): 50 mg via INTRAVENOUS

## 2023-08-26 MED ORDER — SODIUM CHLORIDE 0.9% FLUSH: 3.0000 mL | INTRAVENOUS | Status: DC | PRN

## 2023-08-26 NOTE — Anesthesia Preprocedure Evaluation (Signed)
 Anesthesia Evaluation  Patient identified by MRN, date of birth, ID band Patient awake    Reviewed: Allergy & Precautions, NPO status , Patient's Chart, lab work & pertinent test results  Airway Mallampati: II  TM Distance: >3 FB Neck ROM: Full    Dental  (+) Teeth Intact, Dental Advisory Given   Pulmonary asthma    breath sounds clear to auscultation       Cardiovascular negative cardio ROS  Rhythm:Regular Rate:Tachycardia     Neuro/Psych  Headaches PSYCHIATRIC DISORDERS Anxiety Depression    CVA    GI/Hepatic Neg liver ROS,GERD  Medicated,,  Endo/Other  negative endocrine ROS    Renal/GU negative Renal ROS     Musculoskeletal negative musculoskeletal ROS (+)    Abdominal   Peds  Hematology negative hematology ROS (+)   Anesthesia Other Findings   Reproductive/Obstetrics negative OB ROS                             Anesthesia Physical Anesthesia Plan  ASA: 3  Anesthesia Plan: MAC   Post-op Pain Management: Minimal or no pain anticipated   Induction: Intravenous  PONV Risk Score and Plan: 0 and Propofol infusion  Airway Management Planned: Natural Airway and Nasal Cannula  Additional Equipment: None  Intra-op Plan:   Post-operative Plan:   Informed Consent: I have reviewed the patients History and Physical, chart, labs and discussed the procedure including the risks, benefits and alternatives for the proposed anesthesia with the patient or authorized representative who has indicated his/her understanding and acceptance.       Plan Discussed with: CRNA  Anesthesia Plan Comments:        Anesthesia Quick Evaluation

## 2023-08-26 NOTE — Transfer of Care (Signed)
 Immediate Anesthesia Transfer of Care Note  Patient: Brianna Griffin  Procedure(s) Performed: TRANSESOPHAGEAL ECHOCARDIOGRAM  Patient Location: Cath Lab holding  Anesthesia Type:MAC  Level of Consciousness: awake, alert , and oriented  Airway & Oxygen Therapy: Patient Spontanous Breathing and Patient connected to nasal cannula oxygen  Post-op Assessment: Report given to RN and Post -op Vital signs reviewed and stable  Post vital signs: Reviewed and stable  Last Vitals:  Vitals Value Taken Time  BP 127/97 0908  Temp    Pulse 70 0908  Resp 16 0908  SpO2 100 0908    Last Pain:  Vitals:   08/26/23 0716  TempSrc: Temporal  PainSc:          Complications: No notable events documented.

## 2023-08-26 NOTE — Progress Notes (Addendum)
 STROKE TEAM PROGRESS NOTE   INTERIM HISTORY/SUBJECTIVE Patient has just returned from TEE which showed no clot or definite PFO.  Small defect suspected in the atrial septum suggestive of small secundum ASD but agitated saline test was negative.  TCD bubble study performed yesterday was also negative.  Patient states she is feeling much better.  Her numbness and dizziness is improved.  She wants to go home.  Lupus anticoagulant, Antithrombin III activity, protein C and S are all normal.  Anticardiolipin antibodies are yet pending.  Homocystine, factor V Leiden and prothrombin gene mutation also pending. OBJECTIVE  CBC    Component Value Date/Time   WBC 6.9 08/23/2023 1051   RBC 4.57 08/23/2023 1051   HGB 13.4 08/23/2023 1051   HGB 14.1 10/12/2021 0810   HCT 40.3 08/23/2023 1051   HCT 40.0 10/12/2021 0810   PLT 204 08/23/2023 1051   PLT 262 10/12/2021 0810   MCV 88.2 08/23/2023 1051   MCV 88 10/12/2021 0810   MCH 29.3 08/23/2023 1051   MCHC 33.3 08/23/2023 1051   RDW 13.4 08/23/2023 1051   RDW 12.7 10/12/2021 0810   LYMPHSABS 1.4 08/23/2023 1051   LYMPHSABS 1.9 06/14/2020 1651   MONOABS 0.3 08/23/2023 1051   EOSABS 0.1 08/23/2023 1051   EOSABS 0.1 06/14/2020 1651   BASOSABS 0.0 08/23/2023 1051   BASOSABS 0.0 06/14/2020 1651    BMET    Component Value Date/Time   NA 136 08/23/2023 1051   NA 139 10/12/2021 0810   K 4.0 08/23/2023 1051   CL 103 08/23/2023 1051   CO2 24 08/23/2023 1051   GLUCOSE 96 08/23/2023 1051   BUN 9 08/23/2023 1051   BUN 11 10/12/2021 0810   CREATININE 0.75 08/23/2023 1051   CALCIUM 9.4 08/23/2023 1051   EGFR 72 10/12/2021 0810   GFRNONAA >60 08/23/2023 1051    IMAGING past 24 hours ECHO TEE Result Date: 08/26/2023    TRANSESOPHOGEAL ECHO REPORT   Patient Name:   MERYL HUBERS Date of Exam: 08/26/2023 Medical Rec #:  [id number]       Height:       64.0 in Accession #:    [id number]      Weight:       155.0 lb Date of Birth:  21-Oct-1985       BSA:           1.756 m Patient Age:    37 years        BP:           124/89 mmHg Patient Gender: F               HR:           86 bpm. Exam Location:  Inpatient Procedure: 3D Echo, Transesophageal Echo, Cardiac Doppler, Color Doppler and            Saline Contrast Bubble Study (Both Spectral and Color Flow Doppler            were utilized during procedure). Indications:     Stroke I63.9  History:         Patient has prior history of Echocardiogram examinations, most                  recent 08/24/2023. Stroke and Migraine.  Sonographer:     Lucendia Herrlich RCS Referring Phys:  [id number] Cyndi Bender Diagnosing Phys: Thomasene Ripple DO PROCEDURE: After discussion of the risks and benefits of a TEE, an informed consent was  obtained from the patient. The transesophogeal probe was passed without difficulty through the esophogus of the patient. Imaged were obtained with the patient in a left lateral decubitus position. Sedation performed by different physician. The patient was monitored while under deep sedation. Anesthestetic sedation was provided intravenously by Anesthesiology: 454.31mg  of Propofol. The patient developed no complications during the procedure.  IMPRESSIONS  1. Very small defect (0.189 cm) seen suspicious for secundum atrial septal defect. Evidence of atrial level shunting detected by color flow Doppler. Agitated saline contrast bubble study was negative, with no evidence of any interatrial shunt.  2. Left ventricular ejection fraction, by estimation, is 60 to 65%. The left ventricle has normal function.  3. Right ventricular systolic function is normal. The right ventricular size is normal.  4. No left atrial/left atrial appendage thrombus was detected. The LAA emptying velocity was 41 cm/s.  5. Mild anterior leaflet prolaspse. The mitral valve is abnormal. Mild mitral valve regurgitation. No evidence of mitral stenosis.  6. 3D performed of the mitral valve and demonstrates mitral valve.  7. The aortic valve is normal in  structure. Aortic valve regurgitation is not visualized. FINDINGS  Left Ventricle: Left ventricular ejection fraction, by estimation, is 60 to 65%. The left ventricle has normal function. The left ventricular internal cavity size was normal in size. Right Ventricle: The right ventricular size is normal. No increase in right ventricular wall thickness. Right ventricular systolic function is normal. Left Atrium: Left atrial size was normal in size. No left atrial/left atrial appendage thrombus was detected. The LAA emptying velocity was 41 cm/s. Right Atrium: Right atrial size was normal in size. Pericardium: There is no evidence of pericardial effusion. Mitral Valve: Mild anterior leaflet prolaspse. The mitral valve is abnormal. Mild mitral valve regurgitation. No evidence of mitral valve stenosis. Tricuspid Valve: The tricuspid valve is normal in structure. Tricuspid valve regurgitation is trivial. No evidence of tricuspid stenosis. Aortic Valve: The aortic valve is normal in structure. Aortic valve regurgitation is not visualized. Pulmonic Valve: The pulmonic valve was not well visualized. Pulmonic valve regurgitation is trivial. No evidence of pulmonic stenosis. Aorta: The aortic root and ascending aorta are structurally normal, with no evidence of dilitation. Venous: The left upper pulmonary vein, left lower pulmonary vein, right upper pulmonary vein and right lower pulmonary vein are normal. A normal flow pattern is recorded from the left upper pulmonary vein. IAS/Shunts: Evidence of atrial level shunting detected by color flow Doppler. Agitated saline contrast was given intravenously to evaluate for intracardiac shunting. Agitated saline contrast bubble study was negative, with no evidence of any interatrial shunt. Very small defect (0.189 cm) seen suspicious for secundum atrial septal defect. Additional Comments: Spectral Doppler performed. LEFT VENTRICLE PLAX 2D LVOT diam:     2.00 cm LVOT Area:     3.14 cm    AORTA Ao Root diam: 2.50 cm Ao Asc diam:  2.55 cm  SHUNTS Systemic Diam: 2.00 cm Kardie Tobb DO Electronically signed by Thomasene Ripple DO Signature Date/Time: 08/26/2023/2:37:13 PM    Final    EP STUDY Result Date: 08/26/2023 See surgical note for result.  VAS [place] LOWER EXTREMITY VENOUS (DVT) Result Date: 08/25/2023  Lower Venous DVT Study Patient Name:  TYJAE SHVARTSMAN  Date of Exam:   08/25/2023 Medical Rec #: [id number]        Accession #:    [id number] Date of Birth: 26-Dec-1985        Patient Gender: F Patient Age:   37 years Exam  Location:  [facility] Procedure:      VAS [place] LOWER EXTREMITY VENOUS (DVT) Referring Phys: Kally Cadden --------------------------------------------------------------------------------  Indications: Stroke.  Comparison Study: No prior study on file Performing Technologist: Sherren Kerns RVS  Examination Guidelines: A complete evaluation includes B-mode imaging, spectral Doppler, color Doppler, and power Doppler as needed of all accessible portions of each vessel. Bilateral testing is considered an integral part of a complete examination. Limited examinations for reoccurring indications may be performed as noted. The reflux portion of the exam is performed with the patient in reverse Trendelenburg.  +---------+---------------+---------+-----------+----------+--------------+ RIGHT    CompressibilityPhasicitySpontaneityPropertiesThrombus Aging +---------+---------------+---------+-----------+----------+--------------+ CFV      Full           Yes      Yes                                 +---------+---------------+---------+-----------+----------+--------------+ SFJ      Full                                                        +---------+---------------+---------+-----------+----------+--------------+ FV Prox  Full                                                        +---------+---------------+---------+-----------+----------+--------------+ FV Mid    Full                                                        +---------+---------------+---------+-----------+----------+--------------+ FV DistalFull                                                        +---------+---------------+---------+-----------+----------+--------------+ PFV      Full           Yes      Yes                                 +---------+---------------+---------+-----------+----------+--------------+ POP      Full           Yes      Yes                                 +---------+---------------+---------+-----------+----------+--------------+ PTV      Full                                                        +---------+---------------+---------+-----------+----------+--------------+ PERO     Full                                                        +---------+---------------+---------+-----------+----------+--------------+   +---------+---------------+---------+-----------+----------+--------------+  LEFT     CompressibilityPhasicitySpontaneityPropertiesThrombus Aging +---------+---------------+---------+-----------+----------+--------------+ CFV      Full           Yes      Yes                                 +---------+---------------+---------+-----------+----------+--------------+ SFJ      Full                                                        +---------+---------------+---------+-----------+----------+--------------+ FV Prox  Full                                                        +---------+---------------+---------+-----------+----------+--------------+ FV Mid   Full                                                        +---------+---------------+---------+-----------+----------+--------------+ FV DistalFull                                                        +---------+---------------+---------+-----------+----------+--------------+ PFV      Full                                                         +---------+---------------+---------+-----------+----------+--------------+ POP      Full           Yes      Yes                                 +---------+---------------+---------+-----------+----------+--------------+ PTV      Full                                                        +---------+---------------+---------+-----------+----------+--------------+ PERO     Full                                                        +---------+---------------+---------+-----------+----------+--------------+     Summary: BILATERAL: - No evidence of deep vein thrombosis seen in the lower extremities, bilaterally. -No evidence of popliteal cyst, bilaterally.   *See table(s) above for measurements and observations. Electronically signed by Gerarda Fraction on 08/25/2023 at 7:31:04 PM.  Final    VAS [place] TRANSCRANIAL DOPPLER W BUBBLES Result Date: 08/25/2023  Transcranial Doppler with Bubble Patient Name:  PARUL PORCELLI  Date of Exam:   08/25/2023 Medical Rec #: [id number]        Accession #:    [id number] Date of Birth: [birth year]        Patient Gender: F Patient Age:   55 years Exam Location:  [facility] Procedure:      VAS [place] TRANSCRANIAL DOPPLER W BUBBLES Referring Phys: Caren Garske --------------------------------------------------------------------------------  Indications: Stroke. History: History of migraines. Comparison Study: No prior study on file Performing Technologist: Sherren Kerns RVS  Examination Guidelines: A complete evaluation includes B-mode imaging, spectral Doppler, color Doppler, and power Doppler as needed of all accessible portions of each vessel. Bilateral testing is considered an integral part of a complete examination. Limited examinations for reoccurring indications may be performed as noted.  Summary: This was a normal transcranial Doppler study, with normal flow direction and velocity of all identified vessels of the anterior and posterior  circulations, with no evidence of stenosis, vasospasm or occlusion. There was no evidence of intracranial disease. A vascular evaluation was performed. The left middle cerebral artery was studied. An IV was inserted into the patient's left AC. Verbal informed consent was obtained.  *See table(s) above for TCD measurements and observations.    Preliminary     Vitals:   08/26/23 0906 08/26/23 0915 08/26/23 0930 08/26/23 1029  BP: (!) 124/97 119/81 (!) 123/91 116/76  Pulse: 86 81 87 90  Resp: 14 14 14 17   Temp:    98.5 F (36.9 C)  TempSrc:    Oral  SpO2: 100% 100% 97% 100%  Weight:      Height:         PHYSICAL EXAM General:  Alert, well-nourished, well-developed young Caucasian lady t in no acute distress Psych:  Mood and affect appropriate for situation CV: Regular rate and rhythm on monitor Respiratory:  Regular, unlabored respirations on room air   NEURO:  Mental Status: AA&Ox3, patient is able to give clear and coherent history Speech/Language: speech is without dysarthria or aphasia.    Cranial Nerves:  II: PERRL.  III, IV, VI: EOMI. Eyelids elevate symmetrically.  V: Sensation is intact to light touch and symmetrical to face.  VII: Face is symmetrical resting and smiling VIII: hearing intact to voice. IX, X: Phonation is normal.  EX:[id number] shrug 5/5. XII: tongue is midline without fasciculations. Motor: 5/5 strength to all muscle groups tested.  Tone: is normal and bulk is normal Sensation- Intact to light touch bilaterally but diminished in the right upper extremity, sensation intact in the right lower extremity Coordination: FTN intact bilaterally Gait- deferred  Most Recent NIH  1a Level of Conscious.: 0 1b LOC Questions: 0 1c LOC Commands: 0 2 Best Gaze: 0 3 Visual: 0 4 Facial Palsy: 0 5a Motor Arm - left: 0 5b Motor Arm - Right: 0 6a Motor Leg - Left: 0 6b Motor Leg - Right: 0 7 Limb Ataxia: 0 8 Sensory: 1 9 Best Language: 0 10 Dysarthria: 0 11  Extinct. and Inatten.: 0 TOTAL: 1   ASSESSMENT/PLAN  Ms. Chantil Bari is a 38 y.o. female with history of GERD, migraines, miscarriage, anxiety and depression admitted for sudden onset right sided numbness and difficulty with ambulation.  Patient reports having a headache at the time this started but states it did not feel like her typical migraine.  MRI brain shows small left  thalamic stroke.  Patient verbalizes that her right-sided numbness has improved.  NIH on Admission 0  Acute Ischemic Infarct:  left thalamic stroke Etiology: Cryptogenic, undergoing stroke in young workup CT head 12 x 10 x 13 mm enhancing dural based mass at porous acoustic [place] and extending inferiorly, likely representing meningioma CTA head & neck no LVO or hemodynamically significant stenosis MRI 5 mm nonhemorrhagic infarct in lateral left thalamus, 14 x 12 x 15 mm dural based enhancing mass lesion at right CP angle consistent with a meningioma 2D Echo EF 60 to 65%, normal left atrial size, no atrial level shunt TEE no clot or PFO.  Suspicious for small secundum ASD but no right-to-left shunt with saline injection Will consider 30-day cardiac monitor or loop recorder if TEE negative for PFO LDL 110 HgbA1c 4.5 Hypercoagulable workup pending, will follow-up as outpatient VTE prophylaxis -Lovenox No antithrombotic prior to admission, now on aspirin 81 mg daily and clopidogrel 75 mg daily for 3 weeks and then aspirin alone. Therapy recommendations:  Pending Disposition: Pending, likely home  Hypertension Home meds: None Stable Blood Pressure Goal: BP less than 220/110   Hyperlipidemia Home meds: None LDL 110, goal < 70 Add atorvastatin 40 mg daily Continue statin at discharge   Other Stroke Risk Factors Migraines   Other Active Problems Enhancing lesion in the right cerebellar pontine angle ?  Meningioma.  Will need outpatient follow-up with ENT and neurosurgery to plan treatment.  Patient already has  schedule appointment to see ENT and will bring up this matter at that visit  Hospital day # 25  38 year old Caucasian lady with no significant risk factor for strokes presented with 2 days of right-sided paresthesias secondary to left thalamic infarct likely from small vessel disease.     Workup for vasculitic labs unavailable anticoagulation labs are all negative so far.  Continue aspirin and Plavix for 3 weeks followed by aspirin alone and aggressive risk factor modification.  Patient appears to be at risk for obstructive sleep apnea and was offered a chance to consider participation in the sleep smart study but after reviewing information she decided against participation.  May consider outpatient evaluation for sleep apnea if agreeable later.  She also has a right CP angle enhancing mass possibly meningioma for which she needs outpatient neurosurgery follow-up.  Long discussion with patient and husband and answered questions.  Follow-up as an outpatient stroke clinic in 2 months.  Greater than 50% time during this 35-minute visit was spent in counseling coordination of care and discussion patient care team and answering questions.  Discussed with Dr. Waymon Amato.  Stroke team will sign off.   Delia Heady, MD Medical Director [facility] Pager: [phone] 08/26/2023 3:12 PM     To contact Stroke Continuity provider, please refer to [email or web address]. After hours, contact General Neurology

## 2023-08-26 NOTE — Progress Notes (Signed)
 Echocardiogram Echocardiogram Transesophageal has been performed.  Lucendia Herrlich 08/26/2023, 9:22 AM

## 2023-08-26 NOTE — Plan of Care (Signed)
  Problem: Ischemic Stroke/TIA Tissue Perfusion: Goal: Complications of ischemic stroke/TIA will be minimized Outcome: Not Progressing   Problem: Coping: Goal: Will verbalize positive feelings about self Outcome: Not Progressing Goal: Will identify appropriate support needs Outcome: Not Progressing   Problem: Self-Care: Goal: Ability to participate in self-care as condition permits will improve Outcome: Not Progressing Goal: Verbalization of feelings and concerns over difficulty with self-care will improve Outcome: Not Progressing Goal: Ability to communicate needs accurately will improve Outcome: Not Progressing   Problem: Nutrition: Goal: Adequate nutrition will be maintained Outcome: Not Progressing   Problem: Pain Managment: Goal: General experience of comfort will improve and/or be controlled Outcome: Not Progressing

## 2023-08-26 NOTE — Progress Notes (Signed)
 PROGRESS NOTE   Brianna Griffin  RUE:[id number]    DOB: 11/23/1985    DOA: 08/23/2023  PCP: Tommie Sams, DO   I have briefly reviewed patients previous medical records in [facility].  Chief Complaint  Patient presents with   Numbness    Brief Hospital Course:  38 year old married female, works as a Animal nutritionist with [employer], medical history significant for anxiety/depression, GERD, IBS, migraine headaches, miscarriages x 3, initially presented to the [facility] with complaints of headache x 1 month duration and right-sided numbness with difficulty ambulation. Workup revealed acute ischemic stroke and an incidental meningioma.  Patient was transferred to [facility] for further evaluation by neurology/stroke team.  Stroke workup complete.  Intermittent tachycardia on telemetry, briefly up to 150/narrow complex, concern for atrial for flutter.  Consulted EP cardiology with plans for ILR on 4/9.   Assessment & Plan:  Principal Problem:   Ischemic stroke (HCC) Active Problems:   Depression with anxiety   Gastroesophageal reflux disease without esophagitis   Meningioma (HCC)   Asthma   Migraine without aura and without status migrainosus, not intractable   Acute ischemic stroke/left thalamic stroke Presented with right-sided numbness MRI brain: 5 mm acute nonhemorrhagic infarct of the lateral thalamus. CTA Head and Neck: No significant stenosis, aneurysm, or branch vessel occlusion within the anterior circulation. Moderate proximal left P2 segment stenosis. Normal CTA of the neck. 2D echo: LVEF 60-65% TCD bubble study negative. Bilateral lower extremity venous Dopplers: No DVT. TEE: Left atrial appendage clot.  Small defect in the atrial septum suspicious for very small second MAST but agitated saline test is negative. LDL 110, A1c 4.5 Urine pregnancy test negative.  ESR and CRP negative.  HIV screen negative.  Antithrombin activity normal.  UDS negative. Pending labs  (hypercoagulable workup): Cardiolipin antibodies, prothrombin gene mutation, factor V Leyden, serum homocysteine, beta-2 glycoprotein, lupus anticoagulant (not detected), protein S total: 88, protein S activity: 100, protein C total: Pending, protein C activity: 108.  Pending labs can be followed as outpatient. PT and OT did not see any home needs and signed off Neurology consultation and stroke team follow-up appreciated.   Not on antithrombotics PTA, now on aspirin 81 Mg daily + clopidogrel 75 Mg daily x 3 weeks and then aspirin 81 Mg daily alone. Initial plans were for outpatient 30-day heart monitor.  However on reviewing telemetry today, noted intermittent narrow complex tachycardia, mostly sinus tachycardia in the 110s-120s but had an episode of narrow complex tachycardia in the 150s last night, reviewed with the general cardiologist who indicated that atrial flutter could not be ruled out and recommended EP cardiology consult.  Consulted EP Cardiology, who will see and plan on ILR 4/9. Consider outpatient sleep study to rule out sleep apnea. Outpatient follow-up with neurology in stroke clinic in 2 months.  Dyslipidemia LDL 110, goal <70 Started on atorvastatin 40 mg daily, continue at discharge  Meningioma:  MRI Brain: 14 x 12 x 15 mm dural based enhancing mass lesion at the right CP angle consistent with a meningioma. CTA Head: 12 x 10 x 13 mm enhancing dural-based mass lesion at the porous acusticus and extending inferiorly. This most likely represents meningioma. A vestibular schwannoma is also considered. As per note by Neurosurgery on 08/23/2023, patient can follow-up with neurosurgery as outpatient for her meningioma.  They did not think that her presenting symptoms were related to her meningioma.  Cervicogenic headache//history of migraine headaches Patient reports that she has gotten good relief  from topical menthol spray to neck/back of her head recommended by  neurology  GERD: Continue home H2 blocker  Anxiety and depression Continue Wellbutrin and sertraline, home meds  Body mass index is 26.61 kg/m.    DVT prophylaxis: enoxaparin (LOVENOX) injection 40 mg Start: 08/24/23 2200     Code Status: Full Code:  Family Communication: Spouse and mother at bedside.  Discussed in detail with patient this afternoon via phone, updated telemetry findings and concerns, need for EP consult and possible ILR. Recommended overnight stay for same.  She was agreeable. Disposition:  Status is: Inpatient DC home tomorrow pending EP cardiology consult and possible ILR placement.     Consultants:   Neurology/stroke team Cardiology for TEE EP Cardiology  Procedures:   TEE  Antimicrobials:      Subjective:  Seen this morning after TEE.  Symptoms that she had last evening i.e. right-sided teeth area numbness resolved.  No longer anxious.  Denies palpitations.  Still has slight abnormal sensation on the ulnar aspect of right hand but other than that no strokelike symptoms.  Did not report of headache.  Objective:   Vitals:   08/26/23 0906 08/26/23 0915 08/26/23 0930 08/26/23 1029  BP: (!) 124/97 119/81 (!) 123/91 116/76  Pulse: 86 81 87 90  Resp: 14 14 14 17   Temp:    98.5 F (36.9 C)  TempSrc:    Oral  SpO2: 100% 100% 97% 100%  Weight:      Height:        General exam: Young female, moderately built and nourished sitting up comfortably in bed without stress.  Appeared to be in good spirits. Respiratory system: Clear to auscultation. Respiratory effort normal. Cardiovascular system: S1 & S2 heard, RRR. No JVD, murmurs, rubs, gallops or clicks. No pedal edema.  Telemetry personally reviewed: Mostly sinus with periodic mild sinus tachycardia in the 110s, 110s but did have an episode of narrow complex tachycardia up to 150s last night, cannot rule out atrial flutter with 2 and 1 block. Gastrointestinal system: Abdomen is nondistended, soft and  nontender. No organomegaly or masses felt. Normal bowel sounds heard. Central nervous system: Alert and oriented. No focal neurological deficits.  Mild blunting of sensation along the ulnar aspect of the right hand/little finger, compared to the opposite side. Extremities: Symmetric 5 x 5 power. Skin: No rashes, lesions or ulcers Psychiatry: Judgement and insight appear normal. Mood & affect appropriate.     Data Reviewed:   I have personally reviewed following labs and imaging studies   CBC: Recent Labs  Lab 08/23/23 1051  WBC 6.9  NEUTROABS 5.1  HGB 13.4  HCT 40.3  MCV 88.2  PLT 204    Basic Metabolic Panel: Recent Labs  Lab 08/23/23 1051  NA 136  K 4.0  CL 103  CO2 24  GLUCOSE 96  BUN 9  CREATININE 0.75  CALCIUM 9.4    Liver Function Tests: No results for input(s): "AST", "ALT", "ALKPHOS", "BILITOT", "PROT", "ALBUMIN" in the last 168 hours.  CBG: Recent Labs  Lab 08/25/23 1657  GLUCAP 118*    Microbiology Studies:  No results found for this or any previous visit (from the past 240 hours).  Radiology Studies:  ECHO TEE Result Date: 08/26/2023    TRANSESOPHOGEAL ECHO REPORT   Patient Name:   RICA HEATHER Date of Exam: 08/26/2023 Medical Rec #:  [id number]       Height:       64.0 in Accession #:  [id number]      Weight:       155.0 lb Date of Birth:  1986/04/20       BSA:          1.756 m Patient Age:    37 years        BP:           124/89 mmHg Patient Gender: F               HR:           86 bpm. Exam Location:  Inpatient Procedure: 3D Echo, Transesophageal Echo, Cardiac Doppler, Color Doppler and            Saline Contrast Bubble Study (Both Spectral and Color Flow Doppler            were utilized during procedure). Indications:     Stroke I63.9  History:         Patient has prior history of Echocardiogram examinations, most                  recent 08/24/2023. Stroke and Migraine.  Sonographer:     Lucendia Herrlich RCS Referring Phys:  [id number] Cyndi Bender  Diagnosing Phys: Thomasene Ripple DO PROCEDURE: After discussion of the risks and benefits of a TEE, an informed consent was obtained from the patient. The transesophogeal probe was passed without difficulty through the esophogus of the patient. Imaged were obtained with the patient in a left lateral decubitus position. Sedation performed by different physician. The patient was monitored while under deep sedation. Anesthestetic sedation was provided intravenously by Anesthesiology: 454.31mg  of Propofol. The patient developed no complications during the procedure.  IMPRESSIONS  1. Very small defect (0.189 cm) seen suspicious for secundum atrial septal defect. Evidence of atrial level shunting detected by color flow Doppler. Agitated saline contrast bubble study was negative, with no evidence of any interatrial shunt.  2. Left ventricular ejection fraction, by estimation, is 60 to 65%. The left ventricle has normal function.  3. Right ventricular systolic function is normal. The right ventricular size is normal.  4. No left atrial/left atrial appendage thrombus was detected. The LAA emptying velocity was 41 cm/s.  5. Mild anterior leaflet prolaspse. The mitral valve is abnormal. Mild mitral valve regurgitation. No evidence of mitral stenosis.  6. 3D performed of the mitral valve and demonstrates mitral valve.  7. The aortic valve is normal in structure. Aortic valve regurgitation is not visualized. FINDINGS  Left Ventricle: Left ventricular ejection fraction, by estimation, is 60 to 65%. The left ventricle has normal function. The left ventricular internal cavity size was normal in size. Right Ventricle: The right ventricular size is normal. No increase in right ventricular wall thickness. Right ventricular systolic function is normal. Left Atrium: Left atrial size was normal in size. No left atrial/left atrial appendage thrombus was detected. The LAA emptying velocity was 41 cm/s. Right Atrium: Right atrial size was  normal in size. Pericardium: There is no evidence of pericardial effusion. Mitral Valve: Mild anterior leaflet prolaspse. The mitral valve is abnormal. Mild mitral valve regurgitation. No evidence of mitral valve stenosis. Tricuspid Valve: The tricuspid valve is normal in structure. Tricuspid valve regurgitation is trivial. No evidence of tricuspid stenosis. Aortic Valve: The aortic valve is normal in structure. Aortic valve regurgitation is not visualized. Pulmonic Valve: The pulmonic valve was not well visualized. Pulmonic valve regurgitation is trivial. No evidence of pulmonic stenosis. Aorta: The aortic root and ascending aorta are structurally  normal, with no evidence of dilitation. Venous: The left upper pulmonary vein, left lower pulmonary vein, right upper pulmonary vein and right lower pulmonary vein are normal. A normal flow pattern is recorded from the left upper pulmonary vein. IAS/Shunts: Evidence of atrial level shunting detected by color flow Doppler. Agitated saline contrast was given intravenously to evaluate for intracardiac shunting. Agitated saline contrast bubble study was negative, with no evidence of any interatrial shunt. Very small defect (0.189 cm) seen suspicious for secundum atrial septal defect. Additional Comments: Spectral Doppler performed. LEFT VENTRICLE PLAX 2D LVOT diam:     2.00 cm LVOT Area:     3.14 cm   AORTA Ao Root diam: 2.50 cm Ao Asc diam:  2.55 cm  SHUNTS Systemic Diam: 2.00 cm Kardie Tobb DO Electronically signed by Thomasene Ripple DO Signature Date/Time: 08/26/2023/2:37:13 PM    Final    EP STUDY Result Date: 08/26/2023 See surgical note for result.  VAS [place] LOWER EXTREMITY VENOUS (DVT) Result Date: 08/25/2023  Lower Venous DVT Study Patient Name:  CARESSE SEDIVY  Date of Exam:   08/25/2023 Medical Rec #: [id number]        Accession #:    [id number] Date of Birth: [birth year]        Patient Gender: F Patient Age:   78 years Exam Location:  [facility] Procedure:       VAS [place] LOWER EXTREMITY VENOUS (DVT) Referring Phys: PRAMOD SETHI --------------------------------------------------------------------------------  Indications: Stroke.  Comparison Study: No prior study on file Performing Technologist: Sherren Kerns RVS  Examination Guidelines: A complete evaluation includes B-mode imaging, spectral Doppler, color Doppler, and power Doppler as needed of all accessible portions of each vessel. Bilateral testing is considered an integral part of a complete examination. Limited examinations for reoccurring indications may be performed as noted. The reflux portion of the exam is performed with the patient in reverse Trendelenburg.  +---------+---------------+---------+-----------+----------+--------------+ RIGHT    CompressibilityPhasicitySpontaneityPropertiesThrombus Aging +---------+---------------+---------+-----------+----------+--------------+ CFV      Full           Yes      Yes                                 +---------+---------------+---------+-----------+----------+--------------+ SFJ      Full                                                        +---------+---------------+---------+-----------+----------+--------------+ FV Prox  Full                                                        +---------+---------------+---------+-----------+----------+--------------+ FV Mid   Full                                                        +---------+---------------+---------+-----------+----------+--------------+ FV DistalFull                                                        +---------+---------------+---------+-----------+----------+--------------+  PFV      Full           Yes      Yes                                 +---------+---------------+---------+-----------+----------+--------------+ POP      Full           Yes      Yes                                  +---------+---------------+---------+-----------+----------+--------------+ PTV      Full                                                        +---------+---------------+---------+-----------+----------+--------------+ PERO     Full                                                        +---------+---------------+---------+-----------+----------+--------------+   +---------+---------------+---------+-----------+----------+--------------+ LEFT     CompressibilityPhasicitySpontaneityPropertiesThrombus Aging +---------+---------------+---------+-----------+----------+--------------+ CFV      Full           Yes      Yes                                 +---------+---------------+---------+-----------+----------+--------------+ SFJ      Full                                                        +---------+---------------+---------+-----------+----------+--------------+ FV Prox  Full                                                        +---------+---------------+---------+-----------+----------+--------------+ FV Mid   Full                                                        +---------+---------------+---------+-----------+----------+--------------+ FV DistalFull                                                        +---------+---------------+---------+-----------+----------+--------------+ PFV      Full                                                        +---------+---------------+---------+-----------+----------+--------------+  POP      Full           Yes      Yes                                 +---------+---------------+---------+-----------+----------+--------------+ PTV      Full                                                        +---------+---------------+---------+-----------+----------+--------------+ PERO     Full                                                         +---------+---------------+---------+-----------+----------+--------------+     Summary: BILATERAL: - No evidence of deep vein thrombosis seen in the lower extremities, bilaterally. -No evidence of popliteal cyst, bilaterally.   *See table(s) above for measurements and observations. Electronically signed by Gerarda Fraction on 08/25/2023 at 7:31:04 PM.    Final    VAS [place] TRANSCRANIAL DOPPLER W BUBBLES Result Date: 08/25/2023  Transcranial Doppler with Bubble Patient Name:  AILANY KOREN  Date of Exam:   08/25/2023 Medical Rec #: [id number]        Accession #:    [id number] Date of Birth: 05/15/1986        Patient Gender: F Patient Age:   70 years Exam Location:  [facility] Procedure:      VAS [place] TRANSCRANIAL DOPPLER W BUBBLES Referring Phys: PRAMOD SETHI --------------------------------------------------------------------------------  Indications: Stroke. History: History of migraines. Comparison Study: No prior study on file Performing Technologist: Sherren Kerns RVS  Examination Guidelines: A complete evaluation includes B-mode imaging, spectral Doppler, color Doppler, and power Doppler as needed of all accessible portions of each vessel. Bilateral testing is considered an integral part of a complete examination. Limited examinations for reoccurring indications may be performed as noted.  Summary: This was a normal transcranial Doppler study, with normal flow direction and velocity of all identified vessels of the anterior and posterior circulations, with no evidence of stenosis, vasospasm or occlusion. There was no evidence of intracranial disease. A vascular evaluation was performed. The left middle cerebral artery was studied. An IV was inserted into the patient's left AC. Verbal informed consent was obtained.  *See table(s) above for TCD measurements and observations.    Preliminary     Scheduled Meds:    aspirin EC  81 mg Oral Q breakfast   atorvastatin  40 mg Oral Daily   buPROPion  300 mg Oral  Daily   clopidogrel  75 mg Oral Daily   enoxaparin (LOVENOX) injection  40 mg Subcutaneous Q24H   famotidine  20 mg Oral BID   sertraline  100 mg Oral Daily    Continuous Infusions:     LOS: 3 days     Marcellus Scott, MD,  FACP, [facility], [facility], [facility]   [facility] Advisor [place]      To contact the attending provider between 7A-7P or the covering provider during after hours 7P-7A, please log into the web site [email or web address] and access using universal [place] password for  that web site. If you do not have the password, please call the hospital operator.  08/26/2023, 4:20 PM

## 2023-08-26 NOTE — CV Procedure (Signed)
    TRANSESOPHAGEAL ECHOCARDIOGRAM   NAME:  Brianna Griffin    MRN: [id number] DOB:  03/01/1986    ADMIT DATE: 08/23/2023  INDICATIONS: stroke  PROCEDURE:   Informed consent was obtained prior to the procedure. The risks, benefits and alternatives for the procedure were discussed and the patient comprehended these risks.  Risks include, but are not limited to, cough, sore throat, vomiting, nausea, somnolence, esophageal and stomach trauma or perforation, bleeding, low blood pressure, aspiration, pneumonia, infection, trauma to the teeth and death.    Procedural time out performed. The oropharynx was anesthetized with viscous lidocaine.  Anesthesia was administered by the anaesthesilogy team to achieve and maintain moderate to deep conscious sedation.  The patient's heart rate, blood pressure, and oxygen saturation were monitored continuously during the procedure.  The transesophageal probe was inserted in the esophagus and stomach without difficulty and multiple views were obtained.   The patient tolerated the procedure well.  COMPLICATIONS:    There were no immediate complications.  KEY FINDINGS:  Normal LV function, mild mitral regurgitation, no left atrial appendage clot.  Small defect in the atrial septum: Suspicious for a very small secundum ASD.  Agitated saline test is negative.  Full report to follow. Further management per primary team.   Thomasene Ripple, DO [facility] [place]  [facility]  9:07 AM

## 2023-08-26 NOTE — Plan of Care (Signed)

## 2023-08-26 NOTE — Anesthesia Postprocedure Evaluation (Signed)
 Anesthesia Post Note  Patient: Brianna Griffin  Procedure(s) Performed: TRANSESOPHAGEAL ECHOCARDIOGRAM     Patient location during evaluation: PACU Anesthesia Type: MAC Level of consciousness: awake and alert Pain management: pain level controlled Vital Signs Assessment: post-procedure vital signs reviewed and stable Respiratory status: spontaneous breathing, nonlabored ventilation, respiratory function stable and patient connected to nasal cannula oxygen Cardiovascular status: stable and blood pressure returned to baseline Postop Assessment: no apparent nausea or vomiting Anesthetic complications: no  There were no known notable events for this encounter.  Last Vitals:  Vitals:   08/26/23 0930 08/26/23 1029  BP: (!) 123/91 116/76  Pulse: 87 90  Resp: 14 17  Temp:  36.9 C  SpO2: 97% 100%    Last Pain:  Vitals:   08/26/23 1029  TempSrc: Oral  PainSc:                  [name]

## 2023-08-26 NOTE — Interval H&P Note (Signed)
 History and Physical Interval Note:  08/26/2023 8:05 AM  Brianna Griffin  has presented today for surgery, with the diagnosis of stroke.  The various methods of treatment have been discussed with the patient and family. After consideration of risks, benefits and other options for treatment, the patient has consented to  Procedure(s): TRANSESOPHAGEAL ECHOCARDIOGRAM (N/A) as a surgical intervention.  The patient's history has been reviewed, patient examined, no change in status, stable for surgery.  I have reviewed the patient's chart and labs.  Questions were answered to the patient's satisfaction.     [name]

## 2023-08-27 ENCOUNTER — Other Ambulatory Visit (HOSPITAL_COMMUNITY): Payer: Self-pay

## 2023-08-27 ENCOUNTER — Encounter (HOSPITAL_COMMUNITY)
Admission: EM | Disposition: A | Discharge status: Home / Self Care | Payer: Self-pay | Source: Home / Self Care | Attending: Internal Medicine

## 2023-08-27 DIAGNOSIS — I639 Cerebral infarction, unspecified: Secondary | ICD-10-CM | POA: Diagnosis not present

## 2023-08-27 HISTORY — PX: LOOP RECORDER INSERTION: EP1214

## 2023-08-27 LAB — BETA-2-GLYCOPROTEIN I ABS, IGG/M/A
Beta-2 Glyco I IgG: <9 GPI IgG units (ref 0–20)
Beta-2-Glycoprotein I IgA: <9 GPI IgA units (ref 0–25)
Beta-2-Glycoprotein I IgM: <9 GPI IgM units (ref 0–32)

## 2023-08-27 LAB — CARDIOLIPIN ANTIBODIES, IGG, IGM, IGA
Anticardiolipin IgA: <9 U/mL (ref 0–11)
Anticardiolipin IgG: <9 GPL U/mL (ref 0–14)
Anticardiolipin IgM: <9 [MPL'U]/mL (ref 0–12)

## 2023-08-27 LAB — FACTOR 5 LEIDEN

## 2023-08-27 SURGERY — LOOP RECORDER INSERTION

## 2023-08-27 MED ORDER — ASPIRIN 81 MG PO TBEC
81.0000 mg | DELAYED_RELEASE_TABLET | Freq: Every day | ORAL | 0 refills | Status: DC
  Filled 2023-08-27: qty 30, 30d supply, fill #0

## 2023-08-27 MED ORDER — LIDOCAINE-EPINEPHRINE 1 %-1:100000 IJ SOLN
INTRAMUSCULAR | Status: AC
  Filled 2023-08-27: qty 1

## 2023-08-27 MED ORDER — LIDOCAINE-EPINEPHRINE 1 %-1:100000 IJ SOLN
INTRAMUSCULAR | Status: DC | PRN
  Administered 2023-08-27: 10 mL

## 2023-08-27 MED ORDER — CLOPIDOGREL BISULFATE 75 MG PO TABS
75.0000 mg | ORAL_TABLET | Freq: Every day | ORAL | 0 refills | Status: AC
  Filled 2023-08-27: qty 20, 20d supply, fill #0

## 2023-08-27 MED ORDER — ATORVASTATIN CALCIUM 40 MG PO TABS
40.0000 mg | ORAL_TABLET | Freq: Every day | ORAL | 0 refills | Status: DC
  Filled 2023-08-27: qty 30, 30d supply, fill #0

## 2023-08-27 SURGICAL SUPPLY — 2 items
MONITOR REVEAL LINQ II (Prosthesis & Implant Heart) IMPLANT
PACK LOOP INSERTION (CUSTOM PROCEDURE TRAY) ×1 IMPLANT

## 2023-08-27 NOTE — Discharge Instructions (Signed)

## 2023-08-27 NOTE — Discharge Summary (Signed)
 Physician Discharge Summary  Brianna Griffin ZOX:[id number] DOB: May 30, 1985 DOA: 08/23/2023  PCP: Tommie Sams, DO  Admit date: 08/23/2023 Discharge date: 08/27/2023 30 Day Unplanned Readmission Risk Score    Flowsheet Row ED to Hosp-Admission (Current) from 08/23/2023 in [facility]  30 Day Unplanned Readmission Risk Score (%) 11.89 Filed at 08/27/2023 0801       This score is the patient's risk of an unplanned readmission within 30 days of being discharged (0 -100%). The score is based on dignosis, age, lab data, medications, orders, and past utilization.   Low:  0-14.9   Medium: 15-21.9   High: 22-29.9   Extreme: 30 and above          Admitted From: Home Disposition: Home  Recommendations for Outpatient Follow-up:  Follow up with PCP in 1-2 weeks Please obtain BMP/CBC in one week Follow-up with neurology and cardiology in 1 month Please follow up with your PCP on the following pending results: Unresulted Labs (From admission, onward)     Start     Ordered   08/23/23 1828  Homocysteine, serum  (Hypercoagulable Panel, Comprehensive (PNL))  Once,   R        08/23/23 1830   08/23/23 1828  Factor 5 leiden  (Hypercoagulable Panel, Comprehensive (PNL))  Once,   R        08/23/23 1830   08/23/23 1828  Prothrombin gene mutation  (Hypercoagulable Panel, Comprehensive (PNL))  Once,   R        08/23/23 1830              Home Health: None Equipment/Devices: None  Discharge Condition: Stable CODE STATUS: Full code Diet recommendation: Cardiac  Subjective: Seen and examined, husband at bedside, she is feeling well with no complaints.  Comfortable going home.  Brief/Interim Summary: 38 year old married female, works as a Animal nutritionist with [employer], medical history significant for anxiety/depression, GERD, IBS, migraine headaches, miscarriages x 3, initially presented to the [facility] with complaints of headache x 1 month duration and right-sided numbness  with difficulty ambulation. Workup revealed acute ischemic stroke and an incidental meningioma.  Patient was transferred to [facility] for further evaluation by neurology/stroke team.  Details below.   Acute ischemic stroke/left thalamic stroke Presented with right-sided numbness MRI brain: 5 mm acute nonhemorrhagic infarct of the lateral thalamus. CTA Head and Neck: No significant stenosis, aneurysm, or branch vessel occlusion within the anterior circulation. Moderate proximal left P2 segment stenosis. Normal CTA of the neck. 2D echo: LVEF 60-65% TCD bubble study negative. Bilateral lower extremity venous Dopplers: No DVT. TEE: Left atrial appendage clot.  Small defect in the atrial septum suspicious for very small second MAST but agitated saline test is negative. LDL 110, A1c 4.5 Urine pregnancy test negative.  ESR and CRP negative.  HIV screen negative.  Antithrombin activity normal.  UDS negative. Pending labs (hypercoagulable workup): Cardiolipin antibodies, prothrombin gene mutation, factor V Leyden, serum homocysteine, beta-2 glycoprotein, lupus anticoagulant (not detected), protein S total: 88, protein S activity: 100, protein C total: Pending, protein C activity: 108.  Pending labs can be followed as outpatient. PT and OT did not see any home needs and signed off Neurology consultation and stroke team follow-up appreciated.   Not on antithrombotics PTA, now on aspirin 81 Mg daily + clopidogrel 75 Mg daily x 3 weeks and then aspirin 81 Mg daily alone. Initial plans were for outpatient 30-day heart monitor.  However on reviewing telemetry 08/26/2023, noted  intermittent narrow complex tachycardia, mostly sinus tachycardia in the 110s-120s but had an episode of narrow complex tachycardia in the 150s last night, reviewed with the general cardiologist who indicated that atrial flutter could not be ruled out and recommended EP cardiology consult.  Consulted EP Cardiology, patient underwent ILR implant on  08/27/2023 and cleared for discharge.  They will follow-up with her as outpatient. Consider outpatient sleep study to rule out sleep apnea. Outpatient follow-up with neurology in stroke clinic in 1-2 months.   Dyslipidemia LDL 110, goal <70 Started on atorvastatin 40 mg daily, continue at discharge   Meningioma:  MRI Brain: 14 x 12 x 15 mm dural based enhancing mass lesion at the right CP angle consistent with a meningioma. CTA Head: 12 x 10 x 13 mm enhancing dural-based mass lesion at the porous acusticus and extending inferiorly. This most likely represents meningioma. A vestibular schwannoma is also considered. As per note by Neurosurgery on 08/23/2023, patient can follow-up with neurosurgery as outpatient for her meningioma.  They did not think that her presenting symptoms were related to her meningioma.   Cervicogenic headache//history of migraine headaches Patient reports that she has gotten good relief from topical menthol spray to neck/back of her head recommended by neurology   GERD: Continue home H2 blocker   Anxiety and depression Continue Wellbutrin and sertraline, home meds   Body mass index is 26.61 kg/m.  Discharge plan was discussed with patient and/or family member and they verbalized understanding and agreed with it.  Discharge Diagnoses:  Principal Problem:   Ischemic stroke [facility]) Active Problems:   Depression with anxiety   Gastroesophageal reflux disease without esophagitis   Meningioma (HCC)   Asthma   Migraine without aura and without status migrainosus, not intractable    Discharge Instructions   Allergies as of 08/27/2023       Reactions   Ibuprofen Hives        Medication List     TAKE these medications    acetaminophen 500 MG tablet Commonly known as: TYLENOL Take 1,000 mg by mouth every 6 (six) hours as needed for mild pain (pain score 1-3).   albuterol 108 (90 Base) MCG/ACT inhaler Commonly known as: VENTOLIN HFA Inhale 2 puffs into  the lungs every 6 (six) hours as needed for wheezing or shortness of breath.   aspirin EC 81 MG tablet Take 1 tablet (81 mg total) by mouth daily with breakfast. Swallow whole. Start taking on: August 28, 2023   atorvastatin 40 MG tablet Commonly known as: LIPITOR Take 1 tablet (40 mg total) by mouth daily. Start taking on: August 28, 2023   buPROPion 300 MG 24 hr tablet Commonly known as: WELLBUTRIN XL Take 1 tablet (300 mg total) by mouth at bedtime. What changed: when to take this   clopidogrel 75 MG tablet Commonly known as: PLAVIX Take 1 tablet (75 mg total) by mouth daily for 20 days. Start taking on: August 28, 2023   famotidine 20 MG tablet Commonly known as: PEPCID Take 1 tablet (20 mg total) by mouth 2 (two) times daily as needed for acid reflux   meclizine 25 MG tablet Commonly known as: ANTIVERT Take 1 tablet (25 mg total) by mouth 3 (three) times daily as needed for dizziness.   ondansetron 4 MG disintegrating tablet Commonly known as: ZOFRAN-ODT Dissolve 1 tablet (4 mg total) by mouth every 8 (eight) hours as needed for nausea.   Phexxi 1.8-1-0.4 % Gel Generic drug: Lactic Ac-Citric Ac-Pot Bitart  Place 1 Application vaginally daily as needed (contraceptive). Spermicidal   scopolamine 1 MG/3DAYS Commonly known as: Transderm Scop (1.5 MG) Place 1 patch (1.5 mg total) onto the skin every 3 (three) days.   sertraline 100 MG tablet Commonly known as: Zoloft Take 1 tablet (100 mg total) by mouth daily.   TUMS PO Take 1 tablet by mouth daily as needed (heartburn).        Follow-up Information     Tommie Sams, DO Follow up in 1 week(s).   Specialty: Family Medicine Contact information: [address] [place] [address] [phone]         [facility] Follow up in 1 month(s).   Contact information: [address]     [address] [place] [address] [phone]               Allergies   Allergen Reactions   Ibuprofen Hives    Consultations: Neurology neurosurgery and cardiology   Procedures/Studies: EP PPM/ICD IMPLANT Result Date: 08/27/2023 CONCLUSIONS:  1. Successful implantation of a implantable loop recorder for a history of cryptogenic stroke  2. No early apparent complications. Graciella Freer, PA-C Cardiac Electrophysiology   ECHO TEE Result Date: 08/26/2023    TRANSESOPHOGEAL ECHO REPORT   Patient Name:   Brianna Griffin Date of Exam: 08/26/2023 Medical Rec #:  [id number]       Height:       64.0 in Accession #:    [id number]      Weight:       155.0 lb Date of Birth:  1985/11/30       BSA:          1.756 m Patient Age:    37 years        BP:           124/89 mmHg Patient Gender: F               HR:           86 bpm. Exam Location:  Inpatient Procedure: 3D Echo, Transesophageal Echo, Cardiac Doppler, Color Doppler and            Saline Contrast Bubble Study (Both Spectral and Color Flow Doppler            were utilized during procedure). Indications:     Stroke I63.9  History:         Patient has prior history of Echocardiogram examinations, most                  recent 08/24/2023. Stroke and Migraine.  Sonographer:     Lucendia Herrlich RCS Referring Phys:  [id number] Cyndi Bender Diagnosing Phys: Thomasene Ripple DO PROCEDURE: After discussion of the risks and benefits of a TEE, an informed consent was obtained from the patient. The transesophogeal probe was passed without difficulty through the esophogus of the patient. Imaged were obtained with the patient in a left lateral decubitus position. Sedation performed by different physician. The patient was monitored while under deep sedation. Anesthestetic sedation was provided intravenously by Anesthesiology: 454.31mg  of Propofol. The patient developed no complications during the procedure.  IMPRESSIONS  1. Very small defect (0.189 cm) seen suspicious for secundum atrial septal defect. Evidence of atrial level shunting detected by color  flow Doppler. Agitated saline contrast bubble study was negative, with no evidence of any interatrial shunt.  2. Left ventricular ejection fraction, by estimation, is 60 to 65%. The left ventricle has  normal function.  3. Right ventricular systolic function is normal. The right ventricular size is normal.  4. No left atrial/left atrial appendage thrombus was detected. The LAA emptying velocity was 41 cm/s.  5. Mild anterior leaflet prolaspse. The mitral valve is abnormal. Mild mitral valve regurgitation. No evidence of mitral stenosis.  6. 3D performed of the mitral valve and demonstrates mitral valve.  7. The aortic valve is normal in structure. Aortic valve regurgitation is not visualized. FINDINGS  Left Ventricle: Left ventricular ejection fraction, by estimation, is 60 to 65%. The left ventricle has normal function. The left ventricular internal cavity size was normal in size. Right Ventricle: The right ventricular size is normal. No increase in right ventricular wall thickness. Right ventricular systolic function is normal. Left Atrium: Left atrial size was normal in size. No left atrial/left atrial appendage thrombus was detected. The LAA emptying velocity was 41 cm/s. Right Atrium: Right atrial size was normal in size. Pericardium: There is no evidence of pericardial effusion. Mitral Valve: Mild anterior leaflet prolaspse. The mitral valve is abnormal. Mild mitral valve regurgitation. No evidence of mitral valve stenosis. Tricuspid Valve: The tricuspid valve is normal in structure. Tricuspid valve regurgitation is trivial. No evidence of tricuspid stenosis. Aortic Valve: The aortic valve is normal in structure. Aortic valve regurgitation is not visualized. Pulmonic Valve: The pulmonic valve was not well visualized. Pulmonic valve regurgitation is trivial. No evidence of pulmonic stenosis. Aorta: The aortic root and ascending aorta are structurally normal, with no evidence of dilitation. Venous: The left upper  pulmonary vein, left lower pulmonary vein, right upper pulmonary vein and right lower pulmonary vein are normal. A normal flow pattern is recorded from the left upper pulmonary vein. IAS/Shunts: Evidence of atrial level shunting detected by color flow Doppler. Agitated saline contrast was given intravenously to evaluate for intracardiac shunting. Agitated saline contrast bubble study was negative, with no evidence of any interatrial shunt. Very small defect (0.189 cm) seen suspicious for secundum atrial septal defect. Additional Comments: Spectral Doppler performed. LEFT VENTRICLE PLAX 2D LVOT diam:     2.00 cm LVOT Area:     3.14 cm   AORTA Ao Root diam: 2.50 cm Ao Asc diam:  2.55 cm  SHUNTS Systemic Diam: 2.00 cm Kardie Tobb DO Electronically signed by Thomasene Ripple DO Signature Date/Time: 08/26/2023/2:37:13 PM    Final    EP STUDY Result Date: 08/26/2023 See surgical note for result.  VAS [place] LOWER EXTREMITY VENOUS (DVT) Result Date: 08/25/2023  Lower Venous DVT Study Patient Name:  Brianna Griffin  Date of Exam:   08/25/2023 Medical Rec #: [id number]        Accession #:    [id number] Date of Birth: 01/13/1986        Patient Gender: F Patient Age:   1 years Exam Location:  [facility] Procedure:      VAS [place] LOWER EXTREMITY VENOUS (DVT) Referring Phys: PRAMOD SETHI --------------------------------------------------------------------------------  Indications: Stroke.  Comparison Study: No prior study on file Performing Technologist: Sherren Kerns RVS  Examination Guidelines: A complete evaluation includes B-mode imaging, spectral Doppler, color Doppler, and power Doppler as needed of all accessible portions of each vessel. Bilateral testing is considered an integral part of a complete examination. Limited examinations for reoccurring indications may be performed as noted. The reflux portion of the exam is performed with the patient in reverse Trendelenburg.   +---------+---------------+---------+-----------+----------+--------------+ RIGHT    CompressibilityPhasicitySpontaneityPropertiesThrombus Aging +---------+---------------+---------+-----------+----------+--------------+ CFV      Full  Yes      Yes                                 +---------+---------------+---------+-----------+----------+--------------+ SFJ      Full                                                        +---------+---------------+---------+-----------+----------+--------------+ FV Prox  Full                                                        +---------+---------------+---------+-----------+----------+--------------+ FV Mid   Full                                                        +---------+---------------+---------+-----------+----------+--------------+ FV DistalFull                                                        +---------+---------------+---------+-----------+----------+--------------+ PFV      Full           Yes      Yes                                 +---------+---------------+---------+-----------+----------+--------------+ POP      Full           Yes      Yes                                 +---------+---------------+---------+-----------+----------+--------------+ PTV      Full                                                        +---------+---------------+---------+-----------+----------+--------------+ PERO     Full                                                        +---------+---------------+---------+-----------+----------+--------------+   +---------+---------------+---------+-----------+----------+--------------+ LEFT     CompressibilityPhasicitySpontaneityPropertiesThrombus Aging +---------+---------------+---------+-----------+----------+--------------+ CFV      Full           Yes      Yes                                  +---------+---------------+---------+-----------+----------+--------------+ SFJ      Full                                                        +---------+---------------+---------+-----------+----------+--------------+  FV Prox  Full                                                        +---------+---------------+---------+-----------+----------+--------------+ FV Mid   Full                                                        +---------+---------------+---------+-----------+----------+--------------+ FV DistalFull                                                        +---------+---------------+---------+-----------+----------+--------------+ PFV      Full                                                        +---------+---------------+---------+-----------+----------+--------------+ POP      Full           Yes      Yes                                 +---------+---------------+---------+-----------+----------+--------------+ PTV      Full                                                        +---------+---------------+---------+-----------+----------+--------------+ PERO     Full                                                        +---------+---------------+---------+-----------+----------+--------------+     Summary: BILATERAL: - No evidence of deep vein thrombosis seen in the lower extremities, bilaterally. -No evidence of popliteal cyst, bilaterally.   *See table(s) above for measurements and observations. Electronically signed by Gerarda Fraction on 08/25/2023 at 7:31:04 PM.    Final    VAS [place] TRANSCRANIAL DOPPLER W BUBBLES Result Date: 08/25/2023  Transcranial Doppler with Bubble Patient Name:  Brianna Griffin  Date of Exam:   08/25/2023 Medical Rec #: [id number]        Accession #:    [id number] Date of Birth: Jan 03, 1986        Patient Gender: F Patient Age:   33 years Exam Location:  [facility] Procedure:      VAS [place] TRANSCRANIAL DOPPLER W  BUBBLES Referring Phys: PRAMOD SETHI --------------------------------------------------------------------------------  Indications: Stroke. History: History of migraines. Comparison Study: No prior study on file Performing Technologist: Sherren Kerns RVS  Examination Guidelines: A complete evaluation includes B-mode imaging, spectral Doppler, color Doppler, and power Doppler as needed of  all accessible portions of each vessel. Bilateral testing is considered an integral part of a complete examination. Limited examinations for reoccurring indications may be performed as noted.  Summary: This was a normal transcranial Doppler study, with normal flow direction and velocity of all identified vessels of the anterior and posterior circulations, with no evidence of stenosis, vasospasm or occlusion. There was no evidence of intracranial disease. A vascular evaluation was performed. The left middle cerebral artery was studied. An IV was inserted into the patient's left AC. Verbal informed consent was obtained.  *See table(s) above for TCD measurements and observations.    Preliminary    ECHOCARDIOGRAM COMPLETE Result Date: 08/24/2023    ECHOCARDIOGRAM REPORT   Patient Name:   Brianna Griffin Date of Exam: 08/24/2023 Medical Rec #:  [id number]       Height:       64.0 in Accession #:    [id number]      Weight:       155.0 lb Date of Birth:  1985-07-31       BSA:          1.756 m Patient Age:    37 years        BP:           123/95 mmHg Patient Gender: F               HR:           96 bpm. [facility] Procedure: 2D Echo, Cardiac Doppler and Color Doppler (Both Spectral and Color            Flow Doppler were utilized during procedure). Indications:    Stroke  History:        Patient has no prior history of Echocardiogram examinations.  Sonographer:    Sheralyn Boatman RDCS Referring Phys: [phone] JACOB J STINSON  Sonographer Comments: Suboptimal parasternal window. IMPRESSIONS  1. Left ventricular ejection fraction, by  estimation, is 60 to 65%. Left ventricular ejection fraction by 2D MOD biplane is 60.5 %. The left ventricle has normal function. The left ventricle has no regional wall motion abnormalities. Left ventricular diastolic parameters were normal.  2. Right ventricular systolic function is normal. The right ventricular size is normal. Tricuspid regurgitation signal is inadequate for assessing PA pressure.  3. The mitral valve is normal in structure. No evidence of mitral valve regurgitation.  4. The aortic valve is tricuspid. Aortic valve regurgitation is not visualized.  5. The inferior vena cava is normal in size with greater than 50% respiratory variability, suggesting right atrial pressure of 3 mmHg. Comparison(s): No prior Echocardiogram. Conclusion(s)/Recommendation(s): Normal biventricular function without evidence of hemodynamically significant valvular heart disease. FINDINGS  Left Ventricle: Left ventricular ejection fraction, by estimation, is 60 to 65%. Left ventricular ejection fraction by 2D MOD biplane is 60.5 %. The left ventricle has normal function. The left ventricle has no regional wall motion abnormalities. The left ventricular internal cavity size was normal in size. There is no left ventricular hypertrophy. Left ventricular diastolic parameters were normal. Right Ventricle: The right ventricular size is normal. No increase in right ventricular wall thickness. Right ventricular systolic function is normal. Tricuspid regurgitation signal is inadequate for assessing PA pressure. Left Atrium: Left atrial size was normal in size. Right Atrium: Right atrial size was normal in size. Pericardium: There is no evidence of pericardial effusion. Mitral Valve: The mitral valve is normal in structure. No evidence of mitral valve regurgitation. Tricuspid Valve: The tricuspid  valve is normal in structure. Tricuspid valve regurgitation is not demonstrated. Aortic Valve: The aortic valve is tricuspid. Aortic valve  regurgitation is not visualized. Pulmonic Valve: The pulmonic valve was normal in structure. Pulmonic valve regurgitation is not visualized. Aorta: The aortic root and ascending aorta are structurally normal, with no evidence of dilitation. Venous: The inferior vena cava is normal in size with greater than 50% respiratory variability, suggesting right atrial pressure of 3 mmHg. IAS/Shunts: No atrial level shunt detected by color flow Doppler.  LEFT VENTRICLE PLAX 2D                        Biplane EF (MOD) LVIDd:         3.80 cm         LV Biplane EF:   Left LVIDs:         2.80 cm                          ventricular LV PW:         0.70 cm                          ejection LV IVS:        0.80 cm                          fraction by LVOT diam:     1.90 cm                          2D MOD LV SV:         41                               biplane is LV SV Index:   23                               60.5 %. LVOT Area:     2.84 cm                                Diastology                                LV e' medial:    10.10 cm/s LV Volumes (MOD)               LV E/e' medial:  5.5 LV vol d, MOD    65.8 ml       LV e' lateral:   12.50 cm/s A2C:                           LV E/e' lateral: 4.5 LV vol d, MOD    38.6 ml A4C: LV vol s, MOD    24.8 ml A2C: LV vol s, MOD    16.2 ml A4C: LV SV MOD A2C:   41.0 ml LV SV MOD A4C:   38.6 ml LV SV MOD BP:    32.3 ml RIGHT VENTRICLE             IVC RV S prime:  10.40 cm/s  IVC diam: 1.60 cm TAPSE (M-mode): 1.2 cm LEFT ATRIUM             Index       RIGHT ATRIUM          Index LA diam:        2.00 cm 1.14 cm/m  RA Area:     9.07 cm LA Vol (A2C):   13.6 ml 7.75 ml/m  RA Volume:   16.20 ml 9.23 ml/m LA Vol (A4C):   12.9 ml 7.35 ml/m LA Biplane Vol: 14.1 ml 8.03 ml/m  AORTIC VALVE LVOT Vmax:   93.50 cm/s LVOT Vmean:  61.300 cm/s LVOT VTI:    0.143 m  AORTA Ao Root diam: 2.80 cm MITRAL VALVE MV Area (PHT): 4.25 cm    SHUNTS MV Decel Time: 179 msec    Systemic VTI:  0.14 m MV E velocity:  55.77 cm/s  Systemic Diam: 1.90 cm MV A velocity: 62.33 cm/s MV E/A ratio:  0.89 Zoila Shutter MD Electronically signed by Zoila Shutter MD Signature Date/Time: 08/24/2023/5:45:26 PM    Final    MR Brain W and Wo Contrast Result Date: 08/23/2023 CLINICAL DATA:  Headache.  Intermittent right-sided numbness. EXAM: MRI HEAD WITHOUT AND WITH CONTRAST TECHNIQUE: Multiplanar, multiecho pulse sequences of the brain and surrounding structures were obtained without and with intravenous contrast. CONTRAST:  7mL GADAVIST GADOBUTROL 1 MMOL/ML IV SOLN COMPARISON:  CT angio head and neck 08/23/2023 FINDINGS: Brain: A 5 mm acute nonhemorrhagic infarct is present in the lateral left thalamus. T2 and FLAIR hyperintensity is associated. No other significant scratched at no significant white matter disease is present. Deep brain nuclei are within normal limits. The ventricles are of normal size. No significant extraaxial fluid collection is present. A dural based enhancing mass lesion at the right CP angle measures 14 x 12 x 15 mm. This sits along the anterior aspect of the porous acoustic [place] but does not enter the IAC. The seventh and eighth cranial nerves are separate from the lesion. The left ICA is within normal limits. Vascular: Flow is present in the major intracranial arteries. Skull and upper cervical spine: The craniocervical junction is normal. Upper cervical spine is within normal limits. Marrow signal is unremarkable. Sinuses/Orbits: A right mastoid effusion is present. No obstructing nasopharyngeal lesion is present. The paranasal sinuses and mastoid air cells are otherwise clear. The globes and orbits are within normal limits. IMPRESSION: 1. 5 mm acute nonhemorrhagic infarct of the lateral left thalamus. 2. 14 x 12 x 15 mm dural based enhancing mass lesion at the right CP angle consistent with a meningioma. 3. Right mastoid effusion. No obstructing nasopharyngeal lesion is present. These results were called by telephone  at the time of interpretation on 08/23/2023 at 4:08 pm to provider [facility] TRIPLETT , who verbally acknowledged these results. Electronically Signed   By: Marin Roberts M.D.   On: 08/23/2023 16:17   CT ANGIO HEAD NECK W WO CM Result Date: 08/23/2023 CLINICAL DATA:  Headache, sudden, severe. Intermittent right-sided numbness. Left-sided neck pain. EXAM: CT ANGIOGRAPHY HEAD AND NECK WITH AND WITHOUT CONTRAST TECHNIQUE: Multidetector CT imaging of the head and neck was performed using the standard protocol during bolus administration of intravenous contrast. Multiplanar CT image reconstructions and MIPs were obtained to evaluate the vascular anatomy. Carotid stenosis measurements (when applicable) are obtained utilizing NASCET criteria, using the distal internal carotid diameter as the denominator. RADIATION DOSE REDUCTION: This exam was performed according to the  departmental dose-optimization program which includes automated exposure control, adjustment of the mA and/or kV according to patient size and/or use of iterative reconstruction technique. CONTRAST:  75mL OMNIPAQUE IOHEXOL 350 MG/ML SOLN COMPARISON:  None Available. FINDINGS: CT HEAD FINDINGS Brain: No acute infarct, hemorrhage, or mass lesion is present. No significant white matter lesions are present. The ventricles are of normal size. Deep brain nuclei are within normal limits. No significant extraaxial fluid collection is present. The brainstem and cerebellum are within normal limits. Midline structures are within normal limits. Vascular: No hyperdense vessel or unexpected calcification. Skull: Calvarium is intact. No focal lytic or blastic lesions are present. No significant extracranial soft tissue lesion is present. Sinuses/Orbits: The paranasal sinuses and mastoid air cells are clear. The globes and orbits are within normal limits. Other: Postcontrast images demonstrate an enhancing dural-based mass lesion measuring 12 x 10 x 13 mm at the porous  acusticus and extending inferiorly. The lesion is not extend into the IAC. Review of the MIP images confirms the above findings CTA NECK FINDINGS Aortic arch: A 3 vessel arch configuration is present. No significant vascular disease or stenosis is present. Right carotid system: The right common carotid artery is within normal limits. Bifurcation is unremarkable. Cervical right ICA is normal. Left carotid system: The left common carotid artery is within normal limits. Bifurcation is unremarkable. The cervical left ICA demonstrates mild tortuosity just below the skull base. Vertebral arteries: The vertebral arteries are codominant. Both vertebral arteries originate from the subclavian arteries. No significant stenosis is present in either vertebral artery in the neck. Skeleton: Vertebral body heights alignment are normal. No significant listhesis is present. Mild straightening of the normal cervical lordosis is present. No focal osseous lesions are present. Other neck: The soft tissues of the neck are otherwise unremarkable. Salivary glands are within normal limits. Thyroid is normal. No significant adenopathy is present. No focal mucosal or submucosal lesions are present. Upper chest: The lung apices are clear. The thoracic inlet is within normal limits. Review of the MIP images confirms the above findings CTA HEAD FINDINGS Anterior circulation: The internal carotid arteries are within normal limits the skull base to the ICA termini. The A1 and M1 segments are normal. The right A1 segment is aplastic. Both A2 segments fill from the left. M1 segments are normal bilaterally. The MCA bifurcations are within normal limits. The ACA and MCA branch vessels are normal bilaterally. No aneurysm is present. Posterior circulation: PICA origins are visualized and within normal limits. The left vertebral artery is centrally terminates at the PICA extending only very small distal V4 segment. The vertebrobasilar junction is normal.  The basilar artery is hypoplastic. The left vertebral artery arises from the basilar tip. Moderate proximal left P2 segment stenosis is present. The right posterior cerebral artery is of fetal type. The right posterior cerebral artery is of fetal type. Superior division left P3 segments are not well seen. Venous sinuses: The dural sinuses are patent. The straight sinus and deep cerebral veins are intact. Cortical veins are within normal limits. No significant vascular malformation is evident. Anatomic variants: Fetal type right posterior cerebral artery. Aplastic right A1 segment. Review of the MIP images confirms the above findings IMPRESSION: 1. 12 x 10 x 13 mm enhancing dural-based mass lesion at the porous acusticus and extending inferiorly. This most likely represents meningioma. A vestibular schwannoma is also considered. 2. Otherwise normal CT appearance of the brain. 3. No significant stenosis, aneurysm, or branch vessel occlusion within the anterior  circulation. 4. Moderate proximal left P2 segment stenosis. 5. Superior division left P3 segments are not well seen. This may be related to stenosis or occlusion. 6. Normal CTA of the neck. Recommend MR head without and with contrast for further evaluation. These results were called by telephone at the time of interpretation on 08/23/2023 at 12:34 pm to provider [facility] , who verbally acknowledged these results. Electronically Signed   By: Marin Roberts M.D.   On: 08/23/2023 12:34     Discharge Exam: Vitals:   08/27/23 0353 08/27/23 0824  BP: 129/85 121/85  Pulse: 95 98  Resp:    Temp: 98.2 F (36.8 C) 98.2 F (36.8 C)  SpO2: 100% 100%   Vitals:   08/26/23 1945 08/26/23 2315 08/27/23 0353 08/27/23 0824  BP: 128/73 (!) 136/91 129/85 121/85  Pulse: (!) 105 (!) 106 95 98  Resp: 18     Temp: 98.3 F (36.8 C) 98.5 F (36.9 C) 98.2 F (36.8 C) 98.2 F (36.8 C)  TempSrc: Oral Oral Oral Oral  SpO2: 100% 100% 100% 100%  Weight:       Height:        General: Pt is alert, awake, not in acute distress Cardiovascular: RRR, S1/S2 +, no rubs, no gallops Respiratory: CTA bilaterally, no wheezing, no rhonchi Abdominal: Soft, NT, ND, bowel sounds + Extremities: no edema, no cyanosis    The results of significant diagnostics from this hospitalization (including imaging, microbiology, ancillary and laboratory) are listed below for reference.     Microbiology: No results found for this or any previous visit (from the past 240 hours).   Labs: BNP (last 3 results) No results for input(s): "BNP" in the last 8760 hours. Basic Metabolic Panel: Recent Labs  Lab 08/23/23 1051  NA 136  K 4.0  CL 103  CO2 24  GLUCOSE 96  BUN 9  CREATININE 0.75  CALCIUM 9.4   Liver Function Tests: No results for input(s): "AST", "ALT", "ALKPHOS", "BILITOT", "PROT", "ALBUMIN" in the last 168 hours. No results for input(s): "LIPASE", "AMYLASE" in the last 168 hours. No results for input(s): "AMMONIA" in the last 168 hours. CBC: Recent Labs  Lab 08/23/23 1051  WBC 6.9  NEUTROABS 5.1  HGB 13.4  HCT 40.3  MCV 88.2  PLT 204   Cardiac Enzymes: No results for input(s): "CKTOTAL", "CKMB", "CKMBINDEX", "TROPONINI" in the last 168 hours. BNP: Invalid input(s): "POCBNP" CBG: Recent Labs  Lab 08/25/23 1657  GLUCAP 118*   D-Dimer No results for input(s): "DDIMER" in the last 72 hours. Hgb A1c No results for input(s): "HGBA1C" in the last 72 hours. Lipid Profile No results for input(s): "CHOL", "HDL", "LDLCALC", "TRIG", "CHOLHDL", "LDLDIRECT" in the last 72 hours. Thyroid function studies Recent Labs    08/25/23 1900  TSH 3.081   Anemia work up No results for input(s): "VITAMINB12", "FOLATE", "FERRITIN", "TIBC", "IRON", "RETICCTPCT" in the last 72 hours. Urinalysis    Component Value Date/Time   COLORURINE YELLOW 11/11/2020 0610   APPEARANCEUR CLEAR 11/11/2020 0610   APPEARANCEUR Cloudy (A) 09/29/2017 1330   LABSPEC  1.008 11/11/2020 0610   PHURINE 6.0 11/11/2020 0610   GLUCOSEU NEGATIVE 11/11/2020 0610   HGBUR NEGATIVE 11/11/2020 0610   BILIRUBINUR NEGATIVE 11/11/2020 0610   BILIRUBINUR Negative 09/29/2017 1330   KETONESUR NEGATIVE 11/11/2020 0610   PROTEINUR NEGATIVE 11/11/2020 0610   UROBILINOGEN 0.2 03/04/2014 1222   NITRITE NEGATIVE 11/11/2020 0610   LEUKOCYTESUR NEGATIVE 11/11/2020 0610   Sepsis Labs Recent Labs  Lab 08/23/23 1051  WBC 6.9   Microbiology No results found for this or any previous visit (from the past 240 hours).  FURTHER DISCHARGE INSTRUCTIONS:   Get Medicines reviewed and adjusted: Please take all your medications with you for your next visit with your Primary MD   Laboratory/radiological data: Please request your Primary MD to go over all hospital tests and procedure/radiological results at the follow up, please ask your Primary MD to get all Hospital records sent to his/her office.   In some cases, they will be blood work, cultures and biopsy results pending at the time of your discharge. Please request that your primary care M.D. goes through all the records of your hospital data and follows up on these results.   Also Note the following: If you experience worsening of your admission symptoms, develop shortness of breath, life threatening emergency, suicidal or homicidal thoughts you must seek medical attention immediately by calling 911 or calling your MD immediately  if symptoms less severe.   You must read complete instructions/literature along with all the possible adverse reactions/side effects for all the Medicines you take and that have been prescribed to you. Take any new Medicines after you have completely understood and accpet all the possible adverse reactions/side effects.    Do not drive when taking Pain medications or sleeping medications (Benzodaizepines)   Do not take more than prescribed Pain, Sleep and Anxiety Medications. It is not advisable to  combine anxiety,sleep and pain medications without talking with your primary care practitioner   Special Instructions: If you have smoked or chewed Tobacco  in the last 2 yrs please stop smoking, stop any regular Alcohol  and or any Recreational drug use.   Wear Seat belts while driving.   Please note: You were cared for by a hospitalist during your hospital stay. Once you are discharged, your primary care physician will handle any further medical issues. Please note that NO REFILLS for any discharge medications will be authorized once you are discharged, as it is imperative that you return to your primary care physician (or establish a relationship with a primary care physician if you do not have one) for your post hospital discharge needs so that they can reassess your need for medications and monitor your lab values  Time coordinating discharge: Over 30 minutes  SIGNED:   Hughie Closs, MD  [facility] 08/27/2023, 10:42 AM *Please note that this is a verbal dictation therefore any spelling or grammatical errors are due to the "Dragon [employer] One" system interpretation. If 7PM-7AM, please contact night-coverage [email or web address]

## 2023-08-27 NOTE — TOC Transition Note (Signed)
 Transition of Care [facility]) - Discharge Note   Patient Details  Name: Brianna Griffin MRN: [id number] Date of Birth: [birth year]  [facility] CM/SW Contact:  Kermit Balo, RN Phone Number: 08/27/2023, 10:32 AM   Clinical Narrative:     Pt is discharging home with self care. No needs per TOC. Pt has transportation home.  Final next level of care: Home/Self Care Barriers to Discharge: No Barriers Identified   Patient Goals and CMS Choice            Discharge Placement                       Discharge Plan and Services Additional resources added to the After Visit Summary for                                       Social Drivers of Health (SDOH) Interventions SDOH Screenings   Food Insecurity: No Food Insecurity (08/25/2023)  Housing: Low Risk  (08/25/2023)  Transportation Needs: No Transportation Needs (08/25/2023)  Utilities: Not At Risk (08/25/2023)  Alcohol Screen: Low Risk  (11/18/2022)  Depression (PHQ2-9): Medium Risk (08/18/2023)  Financial Resource Strain: Low Risk  (08/18/2023)  Physical Activity: Inactive (08/18/2023)  Social Connections: Socially Integrated (08/18/2023)  Stress: Stress Concern Present (08/18/2023)  Tobacco Use: Low Risk  (08/23/2023)     Readmission Risk Interventions     No data to display

## 2023-08-27 NOTE — Consult Note (Addendum)
 ELECTROPHYSIOLOGY CONSULT NOTE  Patient ID: Brianna Griffin MRN: [id number], DOB/AGE: Jan 08, 1986   Admit date: 08/23/2023 Date of Consult: 08/27/2023  Primary Physician: Tommie Sams, DO Primary Cardiologist: None  Primary Electrophysiologist: New to None  Reason for Consultation: Cryptogenic stroke; recommendations regarding Implantable Loop Recorder Insurance: [employer] Employee / [employer]  History of Present Illness EP has been asked to evaluate Brianna Griffin for placement of an implantable loop recorder to monitor for atrial fibrillation by Dr Pearlean Brownie.  The patient was admitted on 08/23/2023 with R sided numbness and difficult ambulation.    Imaging demonstrated Acute Ischemic Infarct:  left thalamic stroke Etiology: Cryptogenic, undergoing stroke in young workup  She has undergone workup for stroke including:  CT head 12 x 10 x 13 mm enhancing dural based mass at porous acoustic [place] and extending inferiorly, likely representing meningioma CTA head & neck no LVO or hemodynamically significant stenosis MRI 5 mm nonhemorrhagic infarct in lateral left thalamus, 14 x 12 x 15 mm dural based enhancing mass lesion at right CP angle consistent with a meningioma 2D Echo EF 60 to 65%, normal left atrial size, no atrial level shunt TEE no clot or PFO.  Suspicious for small secundum ASD but no right-to-left shunt with saline injection Will consider 30-day cardiac monitor or loop recorder if TEE negative for PFO LDL 110 HgbA1c 4.5 Hypercoagulable workup pending, will follow-up as outpatient VTE prophylaxis -Lovenox No antithrombotic prior to admission, now on aspirin 81 mg daily and clopidogrel 75 mg daily for 3 weeks and then aspirin alone. Therapy recommendations:  Pending Disposition: Pending, likely home   The patient has been monitored on telemetry which has demonstrated sinus rhythm with brief paroxysms of what appears to represent atrial tachycardia.   Echocardiogram and TEE as above.  Lab work is reviewed.  Prior to admission, the patient denies chest pain, shortness of breath, dizziness, palpitations, or syncope.  She is recovering from her stroke with plans to return home  at discharge.  Allergies, Past Medical, Surgical, Social, and Family Histories have been reviewed and are referenced here-in when relevant for medical decision making.   Inpatient Medications:   aspirin EC  81 mg Oral Q breakfast   atorvastatin  40 mg Oral Daily   buPROPion  300 mg Oral Daily   clopidogrel  75 mg Oral Daily   diphenhydrAMINE  25 mg Intravenous Once   enoxaparin (LOVENOX) injection  40 mg Subcutaneous Q24H   famotidine  20 mg Oral BID   sertraline  100 mg Oral Daily    Physical Exam: Vitals:   08/26/23 1621 08/26/23 1945 08/26/23 2315 08/27/23 0353  BP: 122/88 128/73 (!) 136/91 129/85  Pulse: 85 (!) 105 (!) 106 95  Resp: 17 18    Temp: 98.2 F (36.8 C) 98.3 F (36.8 C) 98.5 F (36.9 C) 98.2 F (36.8 C)  TempSrc: Oral Oral Oral Oral  SpO2: 100% 100% 100% 100%  Weight:      Height:        GEN- NAD. A&O x 3. Normal affect. HEENT: Normocephalic, atraumatic Lungs- CTAB, Normal effort.  Heart- Regular rate and rhythm rate and rhythm. No M/G/R.  Extremities- No peripheral edema. no clubbing or cyanosis Skin- warm and dry, no rash or lesion. Neuro - Right sided numbness but otherwise improved   12-lead ECG on arrival shows NSR at 94 bpm (personally reviewed) No other prior EKG's in EPIC   Telemetry NSR/ST 80-100s, brief episodes of AT up to 150  bpm (personally reviewed)  Assessment and Plan:  1. Cryptogenic stroke The patient presents with cryptogenic stroke.  I spoke at length with the patient about monitoring for afib with an implantable loop recorder.  Risks, benefits, and alteratives to implantable loop recorder were discussed with the patient today.   At this time, the patient is very clear in their decision to proceed with implantable loop recorder.     Wound  care was reviewed with the patient (keep incision clean and dry for 3 days). Please call with questions.    Brianna Freer, PA-C 08/27/2023 7:23 AM  I have seen, examined the patient, and reviewed the above assessment and plan.    HPI: Brianna Griffin is a 38 year old with a past medical history notable for GERD, migraines who presented to the ED on 08/24/2023 with a chief complaint of headache and right-sided numbness.  MRI brain was performed and showed a small left thalamic stroke.  Neurology has recommended loop recorder implant for cryptogenic stroke, for which EP has been consulted.  Today, patient reports feeling relatively well.  She has no new or acute complaints.  GEN: No acute distress.   Cardiac: Normal rate, regular rhythm.  Psych: Normal affect   Assessment and Plan: Brianna Griffin is a 38 year old female who presented with cryptogenic stroke.  EP has been consulted for loop recorder implant to screen for atrial fibrillation as an etiology for her stroke.  # Cryptogenic stroke: We discussed with the patient the role of cardiac monitoring for atrial fibrillation.  Patient voiced understanding. -Explained risks, benefits, and alternatives to loop recorder implantation. Pt verbalized understanding and wants to proceed. -Loop recorder implant this afternoon.   Brianna Putnam, MD 08/27/2023 2:31 PM

## 2023-08-28 ENCOUNTER — Ambulatory Visit: Admitting: Family Medicine

## 2023-08-28 ENCOUNTER — Ambulatory Visit: Admitting: Nurse Practitioner

## 2023-08-28 ENCOUNTER — Encounter (HOSPITAL_COMMUNITY): Payer: Self-pay | Admitting: Student

## 2023-08-28 ENCOUNTER — Encounter: Payer: Self-pay | Admitting: Nurse Practitioner

## 2023-08-28 ENCOUNTER — Telehealth: Payer: Self-pay | Admitting: Family Medicine

## 2023-08-28 ENCOUNTER — Telehealth: Payer: Self-pay | Admitting: *Deleted

## 2023-08-28 VITALS — BP 104/73 | HR 115 | Temp 98.1°F | Ht 64.0 in | Wt 148.0 lb

## 2023-08-28 DIAGNOSIS — R000 Tachycardia, unspecified: Secondary | ICD-10-CM | POA: Diagnosis not present

## 2023-08-28 DIAGNOSIS — I6930 Unspecified sequelae of cerebral infarction: Secondary | ICD-10-CM | POA: Diagnosis not present

## 2023-08-28 DIAGNOSIS — I639 Cerebral infarction, unspecified: Secondary | ICD-10-CM

## 2023-08-28 LAB — PROTHROMBIN GENE MUTATION

## 2023-08-28 NOTE — Telephone Encounter (Signed)
 Patient brought in FMLA to be complete for her mom to help with her care and take care of her son. Papers are in your box.

## 2023-08-28 NOTE — Patient Instructions (Signed)
 Consider tapering off Wellbutrin.  Referring to Heme Once.  Follow up in 3 months.

## 2023-08-28 NOTE — Telephone Encounter (Unsigned)
 Copied from CRM [id number]. Topic: Clinical - Medical Advice >> Aug 28, 2023  4:08 PM Geroge Baseman wrote: Reason for CRM: Patient calling in regard to her stroke, she said she keeps having intermittent numbness on her right side on her face and right arm and shoulder. She said they told her that was normal at the ER, but she is concerned and wants to know at what point is it not considered normal? Patient refuses talking to nurse triage and would like to hear from nurses in office. Please call back ASAP to advise.

## 2023-08-28 NOTE — Telephone Encounter (Signed)
 Spoke with patient on the phone. She has had intermittent mild numbness of the face, arm and occasionally the leg which resolves. No specific triggers. Reports an episode in the hospital. Was told by neuro that this may happen intermittently. Was seen earlier by Dr. Adriana Simas. No note available yet, so message sent. Will get back with patient as soon as possible but in the meantime go straight to ED if new or worsening symptoms.

## 2023-08-29 ENCOUNTER — Encounter: Payer: Self-pay | Admitting: Nurse Practitioner

## 2023-08-29 ENCOUNTER — Other Ambulatory Visit: Payer: Self-pay | Admitting: Nurse Practitioner

## 2023-08-29 NOTE — Progress Notes (Signed)
 Spoke with patient this am. Feeling better with less problems. Slept better last night. Patient is aware she may have off/on mild neuro symptoms as stroke resolved and heals. Agrees to go to ED if any new or severe symptoms. Discussed options for her anxiety. She will try Hydroxyzine that she has at home over the weekend. If no improvement, contact office next week to consider other options. Continue her current regimen.

## 2023-08-29 NOTE — Telephone Encounter (Signed)
 Campbell Riches, NP     08/29/23  2:46 PM  Please call Dr. Marlis Edelson office. She is having recurrent numbness in right face and arms, sometimes leg. Had a workup which indicated cryptogenic stroke. She has an appointment but we wanted to know if she could be seen sooner. Thanks.

## 2023-09-01 ENCOUNTER — Encounter: Payer: Self-pay | Admitting: Nurse Practitioner

## 2023-09-01 ENCOUNTER — Other Ambulatory Visit: Payer: Self-pay | Admitting: Nurse Practitioner

## 2023-09-01 ENCOUNTER — Encounter: Payer: Self-pay | Admitting: Family Medicine

## 2023-09-01 ENCOUNTER — Other Ambulatory Visit (HOSPITAL_COMMUNITY): Payer: Self-pay

## 2023-09-01 DIAGNOSIS — R000 Tachycardia, unspecified: Secondary | ICD-10-CM | POA: Insufficient documentation

## 2023-09-01 DIAGNOSIS — Z0279 Encounter for issue of other medical certificate: Secondary | ICD-10-CM

## 2023-09-01 DIAGNOSIS — I639 Cerebral infarction, unspecified: Secondary | ICD-10-CM

## 2023-09-01 DIAGNOSIS — E785 Hyperlipidemia, unspecified: Secondary | ICD-10-CM | POA: Insufficient documentation

## 2023-09-01 DIAGNOSIS — Z79899 Other long term (current) drug therapy: Secondary | ICD-10-CM

## 2023-09-01 MED ORDER — ATORVASTATIN CALCIUM 40 MG PO TABS
40.0000 mg | ORAL_TABLET | Freq: Every day | ORAL | 0 refills | Status: DC
Start: 2023-09-01 — End: 2023-12-17
  Filled 2023-09-01 – 2023-09-24 (×2): qty 90, 90d supply, fill #0

## 2023-09-01 MED ORDER — ASPIRIN 81 MG PO TBEC
81.0000 mg | DELAYED_RELEASE_TABLET | Freq: Every day | ORAL | 3 refills | Status: AC
  Filled 2023-09-01 – 2023-09-24 (×2): qty 90, 90d supply, fill #0
  Filled 2023-12-21: qty 90, 90d supply, fill #1
  Filled 2024-03-24: qty 90, 90d supply, fill #2
  Filled 2024-06-21: qty 90, 90d supply, fill #3

## 2023-09-01 NOTE — Assessment & Plan Note (Signed)
 Has follow-up with neurology.  Loop recorder in place.  Continue aspirin and Plavix.

## 2023-09-01 NOTE — Telephone Encounter (Signed)
 Pt called in stating that once the FMLA documents are completed to contact her and she also wants a hard copy to pick up.

## 2023-09-01 NOTE — Progress Notes (Signed)
 Subjective:  Patient ID: Brianna Griffin, female    DOB: 1986/05/20  Age: 38 y.o. MRN: [id number]  CC:   Chief Complaint  Patient presents with   Hospitalization Follow-up    Hospital f/u stroke     HPI:  38 year old female presents for hospital follow-up.  Patient admitted from 4/5 to 4/9.  She presented with headache and right-sided numbness and difficulty with ambulation.  Was found to have 5 mm acute nonhemorrhagic infarct of the lateral thalamus.  Workup for stroke notable for small secundum atrial septal defect but bubble study was negative.  Hypercoagulable workup was negative.  Meningioma was discovered on imaging as well.  This was not the cause for her stroke.  Due to tachycardia, loop recorder was placed.  Patient was discharged home on aspirin and Plavix x 3 weeks then aspirin alone.  Patient presents today for follow-up she is feeling tired but is feeling well.  Patient does note that her heart rate seems to be elevated.  Patient Active Problem List   Diagnosis Date Noted   Tachycardia 09/01/2023   Asthma 08/24/2023   Ischemic stroke (HCC) 08/23/2023   Meningioma (HCC) 08/23/2023   Gastroesophageal reflux disease without esophagitis 03/20/2023   Other bursal cyst, left ankle and foot 07/06/2022   Migraine without aura and without status migrainosus, not intractable 11/02/2018   Depression with anxiety 11/13/2015   IBS 10/30/2009    Social Hx   Social History   Socioeconomic History   Marital status: Married    Spouse name: Bellamia Ferch   Number of children: Not on file   Years of education: Not on file   Highest education level: Associate degree: academic program  Occupational History   Not on file  Tobacco Use   Smoking status: Never   Smokeless tobacco: Never  Vaping Use   Vaping status: Never Used  Substance and Sexual Activity   Alcohol use: Yes    Comment: rare   Drug use: No   Sexual activity: Yes    Birth control/protection: Injection  Other  Topics Concern   Not on file  Social History Narrative   Not on file   Social Drivers of Health   Financial Resource Strain: Low Risk  (08/18/2023)   Overall Financial Resource Strain (CARDIA)    Difficulty of Paying Living Expenses: Not very hard  Food Insecurity: No Food Insecurity (08/25/2023)   Hunger Vital Sign    Worried About Running Out of Food in the Last Year: Never true    Ran Out of Food in the Last Year: Never true  Transportation Needs: No Transportation Needs (08/25/2023)   PRAPARE - Administrator, Civil Service (Medical): No    Lack of Transportation (Non-Medical): No  Physical Activity: Inactive (08/18/2023)   Exercise Vital Sign    Days of Exercise per Week: 0 days    Minutes of Exercise per Session: 0 min  Stress: Stress Concern Present (08/18/2023)   [employer] of Occupational Health - Occupational Stress Questionnaire    Feeling of Stress : Rather much  Social Connections: Socially Integrated (08/18/2023)   Social Connection and Isolation Panel [NHANES]    Frequency of Communication with Friends and Family: Twice a week    Frequency of Social Gatherings with Friends and Family: Once a week    Attends Religious Services: More than 4 times per year    Active Member of [employer] or Organizations: Yes    Attends Banker Meetings: More than  4 times per year    Marital Status: Married    Review of Systems Per HPI  Objective:  BP 104/73   Pulse (!) 115   Temp 98.1 F (36.7 C)   Ht 5\' 4"  (1.626 m)   Wt 148 lb (67.1 kg)   LMP  (LMP Unknown) Comment: Off depoprovera but no period yet  SpO2 98%   BMI 25.40 kg/m      08/28/2023   10:05 AM 08/27/2023   11:00 AM 08/27/2023    8:24 AM  BP/Weight  Systolic BP 104 121 121  Diastolic BP 73 73 85  Wt. (Lbs) 148    BMI 25.4 kg/m2      Physical Exam Vitals and nursing note reviewed.  Constitutional:      General: She is not in acute distress.    Appearance: Normal appearance.  HENT:      Head: Normocephalic and atraumatic.  Eyes:     General:        Right eye: No discharge.        Left eye: No discharge.     Conjunctiva/sclera: Conjunctivae normal.  Cardiovascular:     Rate and Rhythm: Regular rhythm. Tachycardia present.  Pulmonary:     Effort: Pulmonary effort is normal.     Breath sounds: Normal breath sounds. No wheezing, rhonchi or rales.  Neurological:     Mental Status: She is alert.  Psychiatric:        Mood and Affect: Mood normal.        Behavior: Behavior normal.     Lab Results  Component Value Date   WBC 6.9 08/23/2023   HGB 13.4 08/23/2023   HCT 40.3 08/23/2023   PLT 204 08/23/2023   GLUCOSE 96 08/23/2023   CHOL 171 08/24/2023   TRIG 66 08/24/2023   HDL 48 08/24/2023   LDLCALC 110 (H) 08/24/2023   ALT 13 10/12/2021   AST 12 10/12/2021   NA 136 08/23/2023   K 4.0 08/23/2023   CL 103 08/23/2023   CREATININE 0.75 08/23/2023   BUN 9 08/23/2023   CO2 24 08/23/2023   TSH 3.081 08/25/2023   INR 1.1 08/25/2023   HGBA1C 4.5 (L) 08/24/2023   EKG: Normal sinus rhythm with short PR interval.    Assessment & Plan:  Ischemic stroke [facility]) Assessment & Plan: Has follow-up with neurology.  Loop recorder in place.  Continue aspirin and Plavix.  Orders: -     Ambulatory referral to Cardiology -     Ambulatory referral to Hematology / Oncology  Tachycardia Assessment & Plan: EKG today with short PR interval.  Needs cardiology follow-up.  Wellbutrin could be playing a role.  We discussed this today.  She will consider tapering and discontinuation.  Orders: -     EKG 12-Lead -     Ambulatory referral to Cardiology    Follow-up: 3 months  Nkenge Sonntag Debrah Fan DO [facility]

## 2023-09-01 NOTE — Assessment & Plan Note (Signed)
 EKG today with short PR interval.  Needs cardiology follow-up.  Wellbutrin could be playing a role.  We discussed this today.  She will consider tapering and discontinuation.

## 2023-09-02 DIAGNOSIS — F411 Generalized anxiety disorder: Secondary | ICD-10-CM | POA: Diagnosis not present

## 2023-09-03 ENCOUNTER — Inpatient Hospital Stay

## 2023-09-03 ENCOUNTER — Other Ambulatory Visit

## 2023-09-03 ENCOUNTER — Encounter: Payer: Self-pay | Admitting: Hematology

## 2023-09-03 ENCOUNTER — Inpatient Hospital Stay: Attending: Hematology | Admitting: Hematology

## 2023-09-03 VITALS — BP 125/90 | HR 88 | Temp 97.5°F | Resp 18 | Ht 64.0 in | Wt 150.0 lb

## 2023-09-03 DIAGNOSIS — I639 Cerebral infarction, unspecified: Secondary | ICD-10-CM | POA: Diagnosis not present

## 2023-09-03 DIAGNOSIS — R760 Raised antibody titer: Secondary | ICD-10-CM | POA: Diagnosis not present

## 2023-09-03 DIAGNOSIS — Z801 Family history of malignant neoplasm of trachea, bronchus and lung: Secondary | ICD-10-CM | POA: Insufficient documentation

## 2023-09-03 DIAGNOSIS — Z8052 Family history of malignant neoplasm of bladder: Secondary | ICD-10-CM | POA: Diagnosis not present

## 2023-09-03 DIAGNOSIS — Z8042 Family history of malignant neoplasm of prostate: Secondary | ICD-10-CM | POA: Diagnosis not present

## 2023-09-03 NOTE — Progress Notes (Signed)
 [facility] [address], [place] [address]   Clinic Day:  09/03/2023  Referring physician: Tommie Sams, DO  Patient Care Team: Brianna Sams, DO as PCP - General (Family Medicine)   ASSESSMENT & PLAN:   Assessment:  1.  Cryptogenic stroke: - Patient seen at the request of Dr. Adriana Griffin for evaluation of hypercoagulable state. - She had left thalamic infarct (5 mm) when she presented to the ER with right-sided numbness and weakness. - 2D echo was normal.  TEE with no clot or PFO.  Suspicious for small secundum ASD but no right-to-left shunt with saline injection. - No B symptoms.  Non-smoker. - Hospitalization workup: LA negative, negative for anticardiolipin and antibeta 2 glycoprotein 1 antibodies.  Normal protein C, protein S and ATIII levels.  [place] [address] mutation negative. - She continues to have right-sided (right cheek, upper and lower extremity) tingling and heaviness, but strength has improved.  2.  Social/family history: - Non-smoker.  Had 3 first trimester miscarriages. - No family history of DVT/PE.  Few family members had stroke after age 3-70. - Maternal grandfather had bladder cancer metastatic to liver.  Maternal great uncle had prostate cancer.  Paternal uncle had prostate cancer.  Maternal uncle had melanoma.  Paternal aunt had lung cancer.  Plan:  1.  Cryptogenic stroke: - We discussed workup for antiphospholipid syndrome and other hypercoagulable states which were negative.  She has a cardiac monitor in place. - Recommend workup for vasculitis with ANA, complement, ANCA, cryoglobulins. - Will also check JAK2 V617F with reflex testing and Factor VIII levels to complete hypercoagulable workup. - RTC 4 weeks for follow-up.   Orders Placed This Encounter  Procedures   Von Willebrand Antigen    Standing Status:   Future    Number of Occurrences:   1    Expiration Date:   09/02/2024   ANA, IFA (with reflex)    Standing  Status:   Future    Number of Occurrences:   1    Expected Date:   09/03/2023    Expiration Date:   09/02/2024   C3 and C4    Standing Status:   Future    Number of Occurrences:   1    Expected Date:   09/03/2023    Expiration Date:   09/02/2024   Complement, total    Standing Status:   Future    Number of Occurrences:   1    Expected Date:   09/03/2023    Expiration Date:   09/02/2024   Cryoglobulin    Standing Status:   Future    Number of Occurrences:   1    Expected Date:   09/03/2023    Expiration Date:   09/02/2024   JAK2 V617F rfx CALR/MPL/E12-15    Standing Status:   Future    Number of Occurrences:   1    Expected Date:   09/03/2023    Expiration Date:   09/02/2024   Miscellaneous [facility] test (send-out)    Standing Status:   Future    Number of Occurrences:   1    Expected Date:   09/03/2023    Expiration Date:   09/02/2024    Test name / description::   ANCA; test [id number]      Brianna Griffin,acting as a scribe for Brianna Massed, MD.,have documented all relevant documentation on the behalf of Brianna Massed, MD,as directed by  Brianna Massed,  MD while in the presence of Brianna Massed, MD.   I, Brianna Massed MD, have reviewed the above documentation for accuracy and completeness, and I agree with the above.   Brianna Massed, MD   4/16/20251:03 PM  CHIEF COMPLAINT/PURPOSE OF CONSULT:   Diagnosis: Cryptogenic stroke  Current Therapy: Under workup  HISTORY OF PRESENT ILLNESS:   Brianna Griffin is a 38 y.o. female presenting to clinic today for evaluation of ischemic stroke at the request of Brianna Sams, DO.  Patient has a medical history of asthma, IBS, GERD, and hyperlipidemia. She saw her PCP on 08/28/23 for follow-up after hospitalization from 08/24/23 to 08/27/23 due to an ischemic stroke. MRI brain showed 5 mm subacute nonhemorrhagic infarct of the lateral thalamus, as well as 14 x 12 x 15 mm dural based enhancing mass lesion at the  right CP angle consistent with a meningioma. CTA head showed: 12 x 10 x 13 mm enhancing dural-based mass lesion at the porous acusticus and extending inferiorly. No DVTs were found on further imaging. While hospitalized, she had a transesophageal loop recorder inserted. TCD bubble study was negative. Hypercoagulable state was negative and she was put on Plavix for 3 weeks and aspirin.   Brianna Griffin has out patient follow-up with neurology for meningioma.   Today, she states that she is doing well overall. Her appetite level is at 100%. Her energy level is at 75%.  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   IBS (irritable bowel syndrome)    Migraine headache    Migraines    Miscarriage 09/09/2013   Thyroid disease    hypothyroid   Vaginal Pap smear, abnormal     Surgical History: Past Surgical History:  Procedure Laterality Date   CYST REMOVAL NECK     LOOP RECORDER INSERTION N/A 08/27/2023   Procedure: LOOP RECORDER INSERTION;  Surgeon: Graciella Freer, PA-C;  Location: [facility] IN[facility]ASIVE [facility];  Service: Cardiovascular;  Laterality: N/A;   TRANSESOPHAGEAL ECHOCARDIOGRAM (CATH LAB) N/A 08/26/2023   Procedure: TRANSESOPHAGEAL ECHOCARDIOGRAM;  Surgeon: Thomasene Ripple, DO;  Location: [facility];  Service: Cardiovascular;  Laterality: N/A;    Social History: Social History   Socioeconomic History   Marital status: Married    Spouse name: Brianna Griffin   Number of children: Not on file   Years of education: Not on file   Highest education level: Associate degree: academic program  Occupational History   Not on file  Tobacco Use   Smoking status: Never   Smokeless tobacco: Never  Vaping Use   Vaping status: Never Used  Substance and  Sexual Activity   Alcohol use: Yes    Comment: rare   Drug use: No   Sexual activity: Yes    Birth control/protection: Injection  Other Topics Concern   Not on file  Social History Narrative   Not on file   Social Drivers of Health   Financial Resource Strain: Low Risk  (08/18/2023)   Overall Financial Resource Strain (CARDIA)    Difficulty of Paying Living Expenses: Not very hard  Food Insecurity: No Food Insecurity (08/25/2023)   Hunger Vital Sign    Worried About Running Out of Food in the Last Year: Never true  Ran Out of Food in the Last Year: Never true  Transportation Needs: No Transportation Needs (08/25/2023)   PRAPARE - Administrator, Civil Service (Medical): No    Lack of Transportation (Non-Medical): No  Physical Activity: Inactive (08/18/2023)   Exercise Vital Sign    Days of Exercise per Week: 0 days    Minutes of Exercise per Session: 0 min  Stress: Stress Concern Present (08/18/2023)   [employer] of Occupational Health - Occupational Stress Questionnaire    Feeling of Stress : Rather much  Social Connections: Socially Integrated (08/18/2023)   Social Connection and Isolation Panel [NHANES]    Frequency of Communication with Friends and Family: Twice a week    Frequency of Social Gatherings with Friends and Family: Once a week    Attends Religious Services: More than 4 times per year    Active Member of [employer] or Organizations: Yes    Attends Engineer, structural: More than 4 times per year    Marital Status: Married  Catering manager Violence: Not At Risk (08/25/2023)   Humiliation, Afraid, Rape, and Kick questionnaire    Fear of Current or Ex-Partner: No    Emotionally Abused: No    Physically Abused: No    Sexually Abused: No    Family History: Family History  Problem Relation Age of Onset   Diabetes Maternal Grandmother    Hypertension Maternal Grandmother    Cancer Maternal Grandfather        bladder and liver   Crohn's  disease Maternal Grandfather    Diabetes Father    Hypertension Father    Post-traumatic stress disorder Father    ADD / ADHD Brother    Depression Brother    ADD / ADHD Sister    Depression Sister    ADD / ADHD Daughter    Epilepsy Daughter    Epilepsy Son    ADD / ADHD Son    Autism Son     Current Medications:  Current Outpatient Medications:    acetaminophen (TYLENOL) 500 MG tablet, Take 1,000 mg by mouth every 6 (six) hours as needed for mild pain (pain score 1-3)., Disp: , Rfl:    albuterol (VENTOLIN HFA) 108 (90 Base) MCG/ACT inhaler, Inhale 2 puffs into the lungs every 6 (six) hours as needed for wheezing or shortness of breath., Disp: 6.7 g, Rfl: 0   aspirin EC 81 MG tablet, Take 1 tablet (81 mg total) by mouth daily with breakfast. Swallow whole., Disp: 90 tablet, Rfl: 3   atorvastatin (LIPITOR) 40 MG tablet, Take 1 tablet (40 mg total) by mouth daily., Disp: 90 tablet, Rfl: 0   buPROPion (WELLBUTRIN XL) 300 MG 24 hr tablet, Take 1 tablet (300 mg total) by mouth at bedtime. (Patient taking differently: Take 300 mg by mouth daily.), Disp: 90 tablet, Rfl: 3   Calcium Carbonate Antacid (TUMS PO), Take 1 tablet by mouth daily as needed (heartburn)., Disp: , Rfl:    clopidogrel (PLAVIX) 75 MG tablet, Take 1 tablet (75 mg total) by mouth daily for 20 days., Disp: 20 tablet, Rfl: 0   famotidine (PEPCID) 20 MG tablet, Take 1 tablet (20 mg total) by mouth 2 (two) times daily as needed for acid reflux, Disp: 180 tablet, Rfl: 0   ondansetron (ZOFRAN-ODT) 4 MG disintegrating tablet, Dissolve 1 tablet (4 mg total) by mouth every 8 (eight) hours as needed for nausea., Disp: 30 tablet, Rfl: 0   PHEXXI 1.8-1-0.4 % GEL, Place 1 Application  vaginally daily as needed (contraceptive). Spermicidal, Disp: , Rfl:    sertraline (ZOLOFT) 100 MG tablet, Take 1 tablet (100 mg total) by mouth daily., Disp: 90 tablet, Rfl: 3   Allergies: Allergies  Allergen Reactions   Ibuprofen Hives    REVIEW OF  SYSTEMS:   Review of Systems  Constitutional:  Negative for chills, fatigue and fever.  HENT:   Negative for lump/mass, mouth sores, nosebleeds, sore throat and trouble swallowing.   Eyes:  Negative for eye problems.  Respiratory:  Negative for cough and shortness of breath.   Cardiovascular:  Negative for chest pain, leg swelling and palpitations.  Gastrointestinal:  Negative for abdominal pain, constipation, diarrhea, nausea and vomiting.  Genitourinary:  Negative for bladder incontinence, difficulty urinating, dysuria, frequency, hematuria and nocturia.   Musculoskeletal:  Negative for arthralgias, back pain, flank pain, myalgias and neck pain.  Skin:  Negative for itching and rash.  Neurological:  Positive for dizziness, headaches and numbness.  Hematological:  Does not bruise/bleed easily.  Psychiatric/Behavioral:  Positive for sleep disturbance. Negative for depression and suicidal ideas. The patient is not nervous/anxious.   All other systems reviewed and are negative.    VITALS:   Blood pressure (!) 125/90, pulse 88, temperature (!) 97.5 F (36.4 C), temperature source Tympanic, resp. rate 18, height 5\' 4"  (1.626 m), weight 150 lb (68 kg), SpO2 100%.  Wt Readings from Last 3 Encounters:  09/03/23 150 lb (68 kg)  08/28/23 148 lb (67.1 kg)  08/23/23 155 lb (70.3 kg)    Body mass index is 25.75 kg/m.   PHYSICAL EXAM:   Physical Exam Vitals and nursing note reviewed. Exam conducted with a chaperone present.  Constitutional:      Appearance: Normal appearance.  Cardiovascular:     Rate and Rhythm: Normal rate and regular rhythm.     Pulses: Normal pulses.     Heart sounds: Normal heart sounds.  Pulmonary:     Effort: Pulmonary effort is normal.     Breath sounds: Normal breath sounds.  Abdominal:     Palpations: Abdomen is soft. There is no hepatomegaly, splenomegaly or mass.     Tenderness: There is no abdominal tenderness.  Musculoskeletal:     Right lower leg: No  edema.     Left lower leg: No edema.  Lymphadenopathy:     Cervical: No cervical adenopathy.     Right cervical: No superficial, deep or posterior cervical adenopathy.    Left cervical: No superficial, deep or posterior cervical adenopathy.     Upper Body:     Right upper body: No supraclavicular or axillary adenopathy.     Left upper body: No supraclavicular or axillary adenopathy.  Neurological:     General: No focal deficit present.     Mental Status: She is alert and oriented to person, place, and time.  Psychiatric:        Mood and Affect: Mood normal.        Behavior: Behavior normal.     LABS:   CBC    Component Value Date/Time   WBC 6.9 08/23/2023 1051   RBC 4.57 08/23/2023 1051   HGB 13.4 08/23/2023 1051   HGB 14.1 10/12/2021 0810   HCT 40.3 08/23/2023 1051   HCT 40.0 10/12/2021 0810   PLT 204 08/23/2023 1051   PLT 262 10/12/2021 0810   MCV 88.2 08/23/2023 1051   MCV 88 10/12/2021 0810   MCH 29.3 08/23/2023 1051   MCHC 33.3 08/23/2023 1051  RDW 13.4 08/23/2023 1051   RDW 12.7 10/12/2021 0810   LYMPHSABS 1.4 08/23/2023 1051   LYMPHSABS 1.9 06/14/2020 1651   MONOABS 0.3 08/23/2023 1051   EOSABS 0.1 08/23/2023 1051   EOSABS 0.1 06/14/2020 1651   BASOSABS 0.0 08/23/2023 1051   BASOSABS 0.0 06/14/2020 1651    CMP    Component Value Date/Time   NA 136 08/23/2023 1051   NA 139 10/12/2021 0810   K 4.0 08/23/2023 1051   CL 103 08/23/2023 1051   CO2 24 08/23/2023 1051   GLUCOSE 96 08/23/2023 1051   BUN 9 08/23/2023 1051   BUN 11 10/12/2021 0810   CREATININE 0.75 08/23/2023 1051   CALCIUM 9.4 08/23/2023 1051   PROT 7.2 10/12/2021 0810   ALBUMIN 4.7 10/12/2021 0810   AST 12 10/12/2021 0810   ALT 13 10/12/2021 0810   ALKPHOS 111 10/12/2021 0810   BILITOT 0.6 10/12/2021 0810   GFRNONAA >60 08/23/2023 1051   GFRAA 138 06/14/2020 1651    No results found for: "CEA1", "CEA" / No results found for: "CEA1", "CEA" No results found for: "PSA1" No results  found for: "ZOX096" No results found for: "CAN125"  No results found for: "TOTALPROTELP", "ALBUMINELP", "A1GS", "A2GS", "BETS", "BETA2SER", "GAMS", "MSPIKE", "SPEI" No results found for: "TIBC", "FERRITIN", "IRONPCTSAT" No results found for: "LDH"   STUDIES:   EP PPM/ICD IMPLANT Result Date: 08/27/2023 CONCLUSIONS:  1. Successful implantation of a implantable loop recorder for a history of cryptogenic stroke  2. No early apparent complications. Graciella Freer, PA-C Cardiac Electrophysiology   ECHO TEE Result Date: 08/26/2023    TRANSESOPHOGEAL ECHO REPORT   Patient Name:   Brianna Griffin Date of Exam: 08/26/2023 Medical Rec #:  [id number]       Height:       64.0 in Accession #:    [id number]      Weight:       155.0 lb Date of Birth:  [birth year]       BSA:          1.756 m Patient Age:    37 years        BP:           124/89 mmHg Patient Gender: F               HR:           86 bpm. Exam Location:  Inpatient Procedure: 3D Echo, Transesophageal Echo, Cardiac Doppler, Color Doppler and            Saline Contrast Bubble Study (Both Spectral and Color Flow Doppler            were utilized during procedure). Indications:     Stroke I63.9  History:         Patient has prior history of Echocardiogram examinations, most                  recent 08/24/2023. Stroke and Migraine.  Sonographer:     Lucendia Herrlich RCS Referring Phys:  [id number] Cyndi Bender Diagnosing Phys: Thomasene Ripple DO PROCEDURE: After discussion of the risks and benefits of a TEE, an informed consent was obtained from the patient. The transesophogeal probe was passed without difficulty through the esophogus of the patient. Imaged were obtained with the patient in a left lateral decubitus position. Sedation performed by different physician. The patient was monitored while under deep sedation. Anesthestetic sedation was provided intravenously by Anesthesiology: 454.31mg  of Propofol. The patient developed no  complications during the procedure.   IMPRESSIONS  1. Very small defect (0.189 cm) seen suspicious for secundum atrial septal defect. Evidence of atrial level shunting detected by color flow Doppler. Agitated saline contrast bubble study was negative, with no evidence of any interatrial shunt.  2. Left ventricular ejection fraction, by estimation, is 60 to 65%. The left ventricle has normal function.  3. Right ventricular systolic function is normal. The right ventricular size is normal.  4. No left atrial/left atrial appendage thrombus was detected. The LAA emptying velocity was 41 cm/s.  5. Mild anterior leaflet prolaspse. The mitral valve is abnormal. Mild mitral valve regurgitation. No evidence of mitral stenosis.  6. 3D performed of the mitral valve and demonstrates mitral valve.  7. The aortic valve is normal in structure. Aortic valve regurgitation is not visualized. FINDINGS  Left Ventricle: Left ventricular ejection fraction, by estimation, is 60 to 65%. The left ventricle has normal function. The left ventricular internal cavity size was normal in size. Right Ventricle: The right ventricular size is normal. No increase in right ventricular wall thickness. Right ventricular systolic function is normal. Left Atrium: Left atrial size was normal in size. No left atrial/left atrial appendage thrombus was detected. The LAA emptying velocity was 41 cm/s. Right Atrium: Right atrial size was normal in size. Pericardium: There is no evidence of pericardial effusion. Mitral Valve: Mild anterior leaflet prolaspse. The mitral valve is abnormal. Mild mitral valve regurgitation. No evidence of mitral valve stenosis. Tricuspid Valve: The tricuspid valve is normal in structure. Tricuspid valve regurgitation is trivial. No evidence of tricuspid stenosis. Aortic Valve: The aortic valve is normal in structure. Aortic valve regurgitation is not visualized. Pulmonic Valve: The pulmonic valve was not well visualized. Pulmonic valve regurgitation is trivial. No  evidence of pulmonic stenosis. Aorta: The aortic root and ascending aorta are structurally normal, with no evidence of dilitation. Venous: The left upper pulmonary vein, left lower pulmonary vein, right upper pulmonary vein and right lower pulmonary vein are normal. A normal flow pattern is recorded from the left upper pulmonary vein. IAS/Shunts: Evidence of atrial level shunting detected by color flow Doppler. Agitated saline contrast was given intravenously to evaluate for intracardiac shunting. Agitated saline contrast bubble study was negative, with no evidence of any interatrial shunt. Very small defect (0.189 cm) seen suspicious for secundum atrial septal defect. Additional Comments: Spectral Doppler performed. LEFT VENTRICLE PLAX 2D LVOT diam:     2.00 cm LVOT Area:     3.14 cm   AORTA Ao Root diam: 2.50 cm Ao Asc diam:  2.55 cm  SHUNTS Systemic Diam: 2.00 cm Kardie Tobb DO Electronically signed by Thomasene Ripple DO Signature Date/Time: 08/26/2023/2:37:13 PM    Final    EP STUDY Result Date: 08/26/2023 See surgical note for result.  VAS [place] LOWER EXTREMITY VENOUS (DVT) Result Date: 08/25/2023  Lower Venous DVT Study Patient Name:  ZEEVA COURSER  Date of Exam:   08/25/2023 Medical Rec #: [id number]        Accession #:    [id number] Date of Birth: 12-14-1985        Patient Gender: F Patient Age:   9 years Exam Location:  [facility] Procedure:      VAS [place] LOWER EXTREMITY VENOUS (DVT) Referring Phys: PRAMOD SETHI --------------------------------------------------------------------------------  Indications: Stroke.  Comparison Study: No prior study on file Performing Technologist: Sherren Kerns RVS  Examination Guidelines: A complete evaluation includes B-mode imaging, spectral Doppler, color Doppler, and power Doppler as needed  of all accessible portions of each vessel. Bilateral testing is considered an integral part of a complete examination. Limited examinations for reoccurring indications may be  performed as noted. The reflux portion of the exam is performed with the patient in reverse Trendelenburg.  +---------+---------------+---------+-----------+----------+--------------+ RIGHT    CompressibilityPhasicitySpontaneityPropertiesThrombus Aging +---------+---------------+---------+-----------+----------+--------------+ CFV      Full           Yes      Yes                                 +---------+---------------+---------+-----------+----------+--------------+ SFJ      Full                                                        +---------+---------------+---------+-----------+----------+--------------+ FV Prox  Full                                                        +---------+---------------+---------+-----------+----------+--------------+ FV Mid   Full                                                        +---------+---------------+---------+-----------+----------+--------------+ FV DistalFull                                                        +---------+---------------+---------+-----------+----------+--------------+ PFV      Full           Yes      Yes                                 +---------+---------------+---------+-----------+----------+--------------+ POP      Full           Yes      Yes                                 +---------+---------------+---------+-----------+----------+--------------+ PTV      Full                                                        +---------+---------------+---------+-----------+----------+--------------+ PERO     Full                                                        +---------+---------------+---------+-----------+----------+--------------+   +---------+---------------+---------+-----------+----------+--------------+ LEFT     CompressibilityPhasicitySpontaneityPropertiesThrombus Aging +---------+---------------+---------+-----------+----------+--------------+  CFV      Full            Yes      Yes                                 +---------+---------------+---------+-----------+----------+--------------+ SFJ      Full                                                        +---------+---------------+---------+-----------+----------+--------------+ FV Prox  Full                                                        +---------+---------------+---------+-----------+----------+--------------+ FV Mid   Full                                                        +---------+---------------+---------+-----------+----------+--------------+ FV DistalFull                                                        +---------+---------------+---------+-----------+----------+--------------+ PFV      Full                                                        +---------+---------------+---------+-----------+----------+--------------+ POP      Full           Yes      Yes                                 +---------+---------------+---------+-----------+----------+--------------+ PTV      Full                                                        +---------+---------------+---------+-----------+----------+--------------+ PERO     Full                                                        +---------+---------------+---------+-----------+----------+--------------+     Summary: BILATERAL: - No evidence of deep vein thrombosis seen in the lower extremities, bilaterally. -No evidence of popliteal cyst, bilaterally.   *See table(s) above for measurements and observations. Electronically signed by Gerarda Fraction on 08/25/2023 at 7:31:04 PM.    Final    VAS [place] TRANSCRANIAL DOPPLER  W BUBBLES Result Date: 08/25/2023  Transcranial Doppler with Bubble Patient Name:  Brianna Griffin  Date of Exam:   08/25/2023 Medical Rec #: [id number]        Accession #:    [id number] Date of Birth: March 29, 1986        Patient Gender: F Patient Age:   21 years Exam Location:  [facility] Procedure:      VAS [place] TRANSCRANIAL DOPPLER W BUBBLES Referring Phys: PRAMOD SETHI --------------------------------------------------------------------------------  Indications: Stroke. History: History of migraines. Comparison Study: No prior study on file Performing Technologist: Sherren Kerns RVS  Examination Guidelines: A complete evaluation includes B-mode imaging, spectral Doppler, color Doppler, and power Doppler as needed of all accessible portions of each vessel. Bilateral testing is considered an integral part of a complete examination. Limited examinations for reoccurring indications may be performed as noted.  Summary: This was a normal transcranial Doppler study, with normal flow direction and velocity of all identified vessels of the anterior and posterior circulations, with no evidence of stenosis, vasospasm or occlusion. There was no evidence of intracranial disease. A vascular evaluation was performed. The left middle cerebral artery was studied. An IV was inserted into the patient's left AC. Verbal informed consent was obtained.  *See table(s) above for TCD measurements and observations.    Preliminary    ECHOCARDIOGRAM COMPLETE Result Date: 08/24/2023    ECHOCARDIOGRAM REPORT   Patient Name:   Brianna Griffin Date of Exam: 08/24/2023 Medical Rec #:  [id number]       Height:       64.0 in Accession #:    [id number]      Weight:       155.0 lb Date of Birth:  [birth year]       BSA:          1.756 m Patient Age:    37 years        BP:           123/95 mmHg Patient Gender: F               HR:           96 bpm. [facility] Procedure: 2D Echo, Cardiac Doppler and Color Doppler (Both Spectral and Color            Flow Doppler were utilized during procedure). Indications:    Stroke  History:        Patient has no prior history of Echocardiogram examinations.  Sonographer:    Sheralyn Boatman RDCS Referring Phys: [phone] JACOB J STINSON  Sonographer Comments: Suboptimal parasternal  window. IMPRESSIONS  1. Left ventricular ejection fraction, by estimation, is 60 to 65%. Left ventricular ejection fraction by 2D MOD biplane is 60.5 %. The left ventricle has normal function. The left ventricle has no regional wall motion abnormalities. Left ventricular diastolic parameters were normal.  2. Right ventricular systolic function is normal. The right ventricular size is normal. Tricuspid regurgitation signal is inadequate for assessing PA pressure.  3. The mitral valve is normal in structure. No evidence of mitral valve regurgitation.  4. The aortic valve is tricuspid. Aortic valve regurgitation is not visualized.  5. The inferior vena cava is normal in size with greater than 50% respiratory variability, suggesting right atrial pressure of 3 mmHg. Comparison(s): No prior Echocardiogram. Conclusion(s)/Recommendation(s): Normal biventricular function without evidence of hemodynamically significant valvular heart disease. FINDINGS  Left Ventricle: Left ventricular ejection fraction, by estimation, is 60 to 65%. Left ventricular  ejection fraction by 2D MOD biplane is 60.5 %. The left ventricle has normal function. The left ventricle has no regional wall motion abnormalities. The left ventricular internal cavity size was normal in size. There is no left ventricular hypertrophy. Left ventricular diastolic parameters were normal. Right Ventricle: The right ventricular size is normal. No increase in right ventricular wall thickness. Right ventricular systolic function is normal. Tricuspid regurgitation signal is inadequate for assessing PA pressure. Left Atrium: Left atrial size was normal in size. Right Atrium: Right atrial size was normal in size. Pericardium: There is no evidence of pericardial effusion. Mitral Valve: The mitral valve is normal in structure. No evidence of mitral valve regurgitation. Tricuspid Valve: The tricuspid valve is normal in structure. Tricuspid valve regurgitation is not  demonstrated. Aortic Valve: The aortic valve is tricuspid. Aortic valve regurgitation is not visualized. Pulmonic Valve: The pulmonic valve was normal in structure. Pulmonic valve regurgitation is not visualized. Aorta: The aortic root and ascending aorta are structurally normal, with no evidence of dilitation. Venous: The inferior vena cava is normal in size with greater than 50% respiratory variability, suggesting right atrial pressure of 3 mmHg. IAS/Shunts: No atrial level shunt detected by color flow Doppler.  LEFT VENTRICLE PLAX 2D                        Biplane EF (MOD) LVIDd:         3.80 cm         LV Biplane EF:   Left LVIDs:         2.80 cm                          ventricular LV PW:         0.70 cm                          ejection LV IVS:        0.80 cm                          fraction by LVOT diam:     1.90 cm                          2D MOD LV SV:         41                               biplane is LV SV Index:   23                               60.5 %. LVOT Area:     2.84 cm                                Diastology                                LV e' medial:    10.10 cm/s LV Volumes (MOD)               LV E/e' medial:  5.5 LV vol d, MOD  65.8 ml       LV e' lateral:   12.50 cm/s A2C:                           LV E/e' lateral: 4.5 LV vol d, MOD    38.6 ml A4C: LV vol s, MOD    24.8 ml A2C: LV vol s, MOD    16.2 ml A4C: LV SV MOD A2C:   41.0 ml LV SV MOD A4C:   38.6 ml LV SV MOD BP:    32.3 ml RIGHT VENTRICLE             IVC RV S prime:     10.40 cm/s  IVC diam: 1.60 cm TAPSE (M-mode): 1.2 cm LEFT ATRIUM             Index       RIGHT ATRIUM          Index LA diam:        2.00 cm 1.14 cm/m  RA Area:     9.07 cm LA Vol (A2C):   13.6 ml 7.75 ml/m  RA Volume:   16.20 ml 9.23 ml/m LA Vol (A4C):   12.9 ml 7.35 ml/m LA Biplane Vol: 14.1 ml 8.03 ml/m  AORTIC VALVE LVOT Vmax:   93.50 cm/s LVOT Vmean:  61.300 cm/s LVOT VTI:    0.143 m  AORTA Ao Root diam: 2.80 cm MITRAL VALVE MV Area (PHT): 4.25 cm     SHUNTS MV Decel Time: 179 msec    Systemic VTI:  0.14 m MV E velocity: 55.77 cm/s  Systemic Diam: 1.90 cm MV A velocity: 62.33 cm/s MV E/A ratio:  0.89 Zoila Shutter MD Electronically signed by Zoila Shutter MD Signature Date/Time: 08/24/2023/5:45:26 PM    Final    MR Brain W and Wo Contrast Result Date: 08/23/2023 CLINICAL DATA:  Headache.  Intermittent right-sided numbness. EXAM: MRI HEAD WITHOUT AND WITH CONTRAST TECHNIQUE: Multiplanar, multiecho pulse sequences of the brain and surrounding structures were obtained without and with intravenous contrast. CONTRAST:  7mL GADAVIST GADOBUTROL 1 MMOL/ML IV SOLN COMPARISON:  CT angio head and neck 08/23/2023 FINDINGS: Brain: A 5 mm acute nonhemorrhagic infarct is present in the lateral left thalamus. T2 and FLAIR hyperintensity is associated. No other significant scratched at no significant white matter disease is present. Deep brain nuclei are within normal limits. The ventricles are of normal size. No significant extraaxial fluid collection is present. A dural based enhancing mass lesion at the right CP angle measures 14 x 12 x 15 mm. This sits along the anterior aspect of the porous acoustic [place] but does not enter the IAC. The seventh and eighth cranial nerves are separate from the lesion. The left ICA is within normal limits. Vascular: Flow is present in the major intracranial arteries. Skull and upper cervical spine: The craniocervical junction is normal. Upper cervical spine is within normal limits. Marrow signal is unremarkable. Sinuses/Orbits: A right mastoid effusion is present. No obstructing nasopharyngeal lesion is present. The paranasal sinuses and mastoid air cells are otherwise clear. The globes and orbits are within normal limits. IMPRESSION: 1. 5 mm acute nonhemorrhagic infarct of the lateral left thalamus. 2. 14 x 12 x 15 mm dural based enhancing mass lesion at the right CP angle consistent with a meningioma. 3. Right mastoid effusion. No obstructing  nasopharyngeal lesion is present. These results were called by telephone at the time of interpretation on 08/23/2023 at  4:08 pm to provider [facility] TRIPLETT , who verbally acknowledged these results. Electronically Signed   By: Audree Leas M.D.   On: 08/23/2023 16:17   CT ANGIO HEAD NECK W WO CM Result Date: 08/23/2023 CLINICAL DATA:  Headache, sudden, severe. Intermittent right-sided numbness. Left-sided neck pain. EXAM: CT ANGIOGRAPHY HEAD AND NECK WITH AND WITHOUT CONTRAST TECHNIQUE: Multidetector CT imaging of the head and neck was performed using the standard protocol during bolus administration of intravenous contrast. Multiplanar CT image reconstructions and MIPs were obtained to evaluate the vascular anatomy. Carotid stenosis measurements (when applicable) are obtained utilizing NASCET criteria, using the distal internal carotid diameter as the denominator. RADIATION DOSE REDUCTION: This exam was performed according to the departmental dose-optimization program which includes automated exposure control, adjustment of the mA and/or kV according to patient size and/or use of iterative reconstruction technique. CONTRAST:  75mL OMNIPAQUE IOHEXOL 350 MG/ML SOLN COMPARISON:  None Available. FINDINGS: CT HEAD FINDINGS Brain: No acute infarct, hemorrhage, or mass lesion is present. No significant white matter lesions are present. The ventricles are of normal size. Deep brain nuclei are within normal limits. No significant extraaxial fluid collection is present. The brainstem and cerebellum are within normal limits. Midline structures are within normal limits. Vascular: No hyperdense vessel or unexpected calcification. Skull: Calvarium is intact. No focal lytic or blastic lesions are present. No significant extracranial soft tissue lesion is present. Sinuses/Orbits: The paranasal sinuses and mastoid air cells are clear. The globes and orbits are within normal limits. Other: Postcontrast images demonstrate an  enhancing dural-based mass lesion measuring 12 x 10 x 13 mm at the porous acusticus and extending inferiorly. The lesion is not extend into the IAC. Review of the MIP images confirms the above findings CTA NECK FINDINGS Aortic arch: A 3 vessel arch configuration is present. No significant vascular disease or stenosis is present. Right carotid system: The right common carotid artery is within normal limits. Bifurcation is unremarkable. Cervical right ICA is normal. Left carotid system: The left common carotid artery is within normal limits. Bifurcation is unremarkable. The cervical left ICA demonstrates mild tortuosity just below the skull base. Vertebral arteries: The vertebral arteries are codominant. Both vertebral arteries originate from the subclavian arteries. No significant stenosis is present in either vertebral artery in the neck. Skeleton: Vertebral body heights alignment are normal. No significant listhesis is present. Mild straightening of the normal cervical lordosis is present. No focal osseous lesions are present. Other neck: The soft tissues of the neck are otherwise unremarkable. Salivary glands are within normal limits. Thyroid is normal. No significant adenopathy is present. No focal mucosal or submucosal lesions are present. Upper chest: The lung apices are clear. The thoracic inlet is within normal limits. Review of the MIP images confirms the above findings CTA HEAD FINDINGS Anterior circulation: The internal carotid arteries are within normal limits the skull base to the ICA termini. The A1 and M1 segments are normal. The right A1 segment is aplastic. Both A2 segments fill from the left. M1 segments are normal bilaterally. The MCA bifurcations are within normal limits. The ACA and MCA branch vessels are normal bilaterally. No aneurysm is present. Posterior circulation: PICA origins are visualized and within normal limits. The left vertebral artery is centrally terminates at the PICA extending  only very small distal V4 segment. The vertebrobasilar junction is normal. The basilar artery is hypoplastic. The left vertebral artery arises from the basilar tip. Moderate proximal left P2 segment stenosis is present. The right posterior  cerebral artery is of fetal type. The right posterior cerebral artery is of fetal type. Superior division left P3 segments are not well seen. Venous sinuses: The dural sinuses are patent. The straight sinus and deep cerebral veins are intact. Cortical veins are within normal limits. No significant vascular malformation is evident. Anatomic variants: Fetal type right posterior cerebral artery. Aplastic right A1 segment. Review of the MIP images confirms the above findings IMPRESSION: 1. 12 x 10 x 13 mm enhancing dural-based mass lesion at the porous acusticus and extending inferiorly. This most likely represents meningioma. A vestibular schwannoma is also considered. 2. Otherwise normal CT appearance of the brain. 3. No significant stenosis, aneurysm, or branch vessel occlusion within the anterior circulation. 4. Moderate proximal left P2 segment stenosis. 5. Superior division left P3 segments are not well seen. This may be related to stenosis or occlusion. 6. Normal CTA of the neck. Recommend MR head without and with contrast for further evaluation. These results were called by telephone at the time of interpretation on 08/23/2023 at 12:34 pm to provider [facility] TRIPLETT , who verbally acknowledged these results. Electronically Signed   By: Audree Leas M.D.   On: 08/23/2023 12:34

## 2023-09-03 NOTE — Patient Instructions (Signed)
 You were seen and examined today by Dr. Cheree Cords. Dr. Cheree Cords is a hematologist, meaning that he specializes in blood abnormalities. Dr. Cheree Cords discussed your past medical history, family history of cancers/blood conditions and the events that led to you being here today.  You were referred to Dr. Cheree Cords due to your recent ischemic stroke.  Dr. Katragadda has recommended additional labs today for further evaluation.  Follow-up as scheduled.

## 2023-09-04 LAB — VON WILLEBRAND ANTIGEN: Von Willebrand Antigen, Plasma: 167 % (ref 50–200)

## 2023-09-04 LAB — C3 AND C4
C3 Complement: 116 mg/dL (ref 82–167)
Complement C4, Body Fluid: 25 mg/dL (ref 12–38)

## 2023-09-05 LAB — MISC LABCORP TEST (SEND OUT): Labcorp test code: 163061

## 2023-09-05 LAB — COMPLEMENT, TOTAL: Compl, Total (CH50): 60 U/mL (ref >41)

## 2023-09-07 NOTE — Progress Notes (Signed)
 Results of the study [with the patient at the time of the study was normal

## 2023-09-08 DIAGNOSIS — F411 Generalized anxiety disorder: Secondary | ICD-10-CM | POA: Diagnosis not present

## 2023-09-08 LAB — CRYOGLOBULIN

## 2023-09-10 ENCOUNTER — Encounter: Payer: Self-pay | Admitting: Nurse Practitioner

## 2023-09-10 LAB — JAK2 V617F RFX CALR/MPL/E12-15

## 2023-09-10 LAB — CALR +MPL + E12-E15  (REFLEX)

## 2023-09-11 LAB — FANA STAINING PATTERNS: Speckled Pattern: 1:160 {titer} — ABNORMAL HIGH

## 2023-09-11 LAB — ANTINUCLEAR ANTIBODIES, IFA: ANA Ab, IFA: POSITIVE — AB

## 2023-09-15 ENCOUNTER — Ambulatory Visit (INDEPENDENT_AMBULATORY_CARE_PROVIDER_SITE_OTHER): Admitting: Audiology

## 2023-09-15 ENCOUNTER — Encounter (INDEPENDENT_AMBULATORY_CARE_PROVIDER_SITE_OTHER): Payer: Self-pay

## 2023-09-15 ENCOUNTER — Other Ambulatory Visit (HOSPITAL_COMMUNITY): Payer: Self-pay

## 2023-09-15 ENCOUNTER — Ambulatory Visit (INDEPENDENT_AMBULATORY_CARE_PROVIDER_SITE_OTHER)

## 2023-09-15 VITALS — BP 145/89 | HR 93 | Ht 64.0 in | Wt 150.0 lb

## 2023-09-15 DIAGNOSIS — H6691 Otitis media, unspecified, right ear: Secondary | ICD-10-CM

## 2023-09-15 DIAGNOSIS — Z0110 Encounter for examination of ears and hearing without abnormal findings: Secondary | ICD-10-CM

## 2023-09-15 DIAGNOSIS — H6991 Unspecified Eustachian tube disorder, right ear: Secondary | ICD-10-CM

## 2023-09-15 DIAGNOSIS — H7291 Unspecified perforation of tympanic membrane, right ear: Secondary | ICD-10-CM | POA: Diagnosis not present

## 2023-09-15 MED ORDER — FLUTICASONE PROPIONATE 50 MCG/ACT NA SUSP
2.0000 | Freq: Every day | NASAL | 6 refills | Status: DC
  Filled 2023-09-15: qty 16, 30d supply, fill #0

## 2023-09-15 NOTE — Progress Notes (Signed)
  [address] [place], [place] [address] [phone]  Audiological Evaluation    Name: Brianna Griffin     DOB:   1985-11-21      MRN:   [id number]                                                                                     Service Date: 09/15/2023     Accompanied by: unaccompanied   Patient comes today after Lorane Rocker, PA-C sent a referral for a hearing evaluation due to concerns with ear drainage.   Symptoms Yes Details  Hearing loss  []    Tinnitus  [x]  Both ears - comes and goes  Ear pain/ infections/pressure  [x]  Right ear pressure today Was Dx with OM at urgent care on 3-18 and during follow-up visit with PCP  were concerns with TM perforation because of right ear drainage  Balance problems  [x]  Dizziness - lately more lightheaded, but at the beginning there was some spinning sensation   Noise exposure history  []    Previous ear surgeries  []    Family history of hearing loss  []    Amplification  []    Other  []      Otoscopy: Right ear: Abnormal eardrum appearance. Left ear:  Abnormal eardrum appearance.  Tympanometry: Right ear: Type C- Normal external ear canal volume with negative middle ear pressure and normal tympanic membrane compliance Left ear: Type A- Normal external ear canal volume with normal middle ear pressure and tympanic membrane compliance  Pure tone Audiometry: Both ears. Normal to borderline normal hearing from [phone] Hz.    Speech Audiometry: Right ear- Speech Reception Threshold (SRT) was obtained at 15 dBHL. Left ear-Speech Reception Threshold (SRT) was obtained at 15 dBHL.   Word Recognition Score Tested using NU-6 (MLV) Right ear: 100% was obtained at a presentation level of 60 dBHL with contralateral masking which is deemed as  excellent. Left ear: 100% was obtained at a presentation level of 60 dBHL with contralateral masking which is deemed as  excellent.   The hearing test results were completed under  headphones and re-checked with inserts and results are deemed to be of good to fair reliability. Test technique:  conventional      Recommendations: Follow up with ENT as scheduled for today. Return for a hearing evaluation if concerns with hearing changes arise or per MD recommendation.   Nyesha Cliff MARIE LEROUX-MARTINEZ, AUD

## 2023-09-15 NOTE — Progress Notes (Unsigned)
 Dear Dr. Franco Isaac, Here is my assessment for our mutual patient, Brianna Griffin. Thank you for allowing me the opportunity to care for your patient. Please do not hesitate to contact me should you have any other questions. Sincerely, Belma Boxer PA-C  Otolaryngology Clinic Note Referring provider: Dr. Franco Isaac HPI:  Brianna Griffin is a 38 y.o. female kindly referred by Dr. Franco Isaac   The patient is a 38 year old female seen in office for evaluation of right sided otitis media.  The patient notes that on March 18 she went to an urgent care right-sided otitis media.  She notes that symptoms originally started with itching in her ear, popping and muffled hearing followed by pain.  She had no URI symptoms.  She did have the flu approximately 2 to 3 weeks before.  She notes that after starting antibiotics her right ear started to drain.  She followed up with her primary care provider and was noted to have a right sided TM rupture.  She notes that she completed her antibiotics at the end of March.  She notes since that time she has been doing well with no reoccurrence of drainage, no significant pain.  She has had some ringing in the right ear since the infection which she does tonal and high-pitched.  Prior to the infection she denies any history of repeated ear infections as an adult, no baseline hearing problems.  Today she reports that her hearing is back to baseline.  She does note a remote history of seasonal allergies and recently started Xyzal.      Independent Review of Additional Tests or Records:  Primary care office visit note on 08/05/2023 Otoscopy: Right ear: Abnormal eardrum appearance. Left ear:  Abnormal eardrum appearance.   Tympanometry: Right ear: Type C- Normal external ear canal volume with negative middle ear pressure and normal tympanic membrane compliance Left ear: Type A- Normal external ear canal volume with normal middle ear pressure and tympanic membrane compliance    Pure tone Audiometry: Both ears. Normal to borderline normal hearing from [phone] Hz.      Speech Audiometry: Right ear- Speech Reception Threshold (SRT) was obtained at 15 dBHL. Left ear-Speech Reception Threshold (SRT) was obtained at 15 dBHL.   Word Recognition Score Tested using NU-6 (MLV) Right ear: 100% was obtained at a presentation level of 60 dBHL with contralateral masking which is deemed as  excellent. Left ear: 100% was obtained at a presentation level of 60 dBHL with contralateral masking which is deemed as  excellent.   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   IBS (irritable bowel syndrome)    Migraine headache    Migraines    Miscarriage 09/09/2013   Thyroid  disease    hypothyroid   Vaginal Pap smear, abnormal      Past Surgical History:  Procedure Laterality Date   CYST REMOVAL NECK     LOOP RECORDER INSERTION N/A 08/27/2023   Procedure: LOOP RECORDER INSERTION;  Surgeon: Tylene Galla, PA-C;  Location: [facility];  Service: Cardiovascular;  Laterality: N/A;   TRANSESOPHAGEAL ECHOCARDIOGRAM (CATH LAB) N/A 08/26/2023  Procedure: TRANSESOPHAGEAL ECHOCARDIOGRAM;  Surgeon: Jerryl Morin, DO;  Location: [facility];  Service: Cardiovascular;  Laterality: N/A;    Family History  Problem Relation Age of Onset   Diabetes Maternal Grandmother    Hypertension Maternal Grandmother    Cancer Maternal Grandfather        bladder and liver   Crohn's disease Maternal Grandfather    Diabetes Father    Hypertension Father    Post-traumatic stress disorder Father    ADD / ADHD Brother     Depression Brother    ADD / ADHD Sister    Depression Sister    ADD / ADHD Daughter    Epilepsy Daughter    Epilepsy Son    ADD / ADHD Son    Autism Son      Social Connections: Socially Integrated (08/18/2023)   Social Connection and Isolation Panel [NHANES]    Frequency of Communication with Friends and Family: Twice a week    Frequency of Social Gatherings with Friends and Family: Once a week    Attends Religious Services: More than 4 times per year    Active Member of [employer] or Organizations: Yes    Attends Engineer, structural: More than 4 times per year    Marital Status: Married      Current Outpatient Medications:    acetaminophen  (TYLENOL ) 500 MG tablet, Take 1,000 mg by mouth every 6 (six) hours as needed for mild pain (pain score 1-3)., Disp: , Rfl:    albuterol  (VENTOLIN  HFA) 108 (90 Base) MCG/ACT inhaler, Inhale 2 puffs into the lungs every 6 (six) hours as needed for wheezing or shortness of breath., Disp: 6.7 g, Rfl: 0   aspirin  EC 81 MG tablet, Take 1 tablet (81 mg total) by mouth daily with breakfast. Swallow whole., Disp: 90 tablet, Rfl: 3   atorvastatin  (LIPITOR) 40 MG tablet, Take 1 tablet (40 mg total) by mouth daily., Disp: 90 tablet, Rfl: 0   buPROPion  (WELLBUTRIN  XL) 300 MG 24 hr tablet, Take 1 tablet (300 mg total) by mouth at bedtime. (Patient taking differently: Take 300 mg by mouth daily.), Disp: 90 tablet, Rfl: 3   Calcium  Carbonate Antacid (TUMS PO), Take 1 tablet by mouth daily as needed (heartburn)., Disp: , Rfl:    clopidogrel  (PLAVIX ) 75 MG tablet, Take 1 tablet (75 mg total) by mouth daily for 20 days., Disp: 20 tablet, Rfl: 0   famotidine  (PEPCID ) 20 MG tablet, Take 1 tablet (20 mg total) by mouth 2 (two) times daily as needed for acid reflux, Disp: 180 tablet, Rfl: 0   ondansetron  (ZOFRAN -ODT) 4 MG disintegrating tablet, Dissolve 1 tablet (4 mg total) by mouth every 8 (eight) hours as needed for nausea., Disp: 30 tablet, Rfl: 0   PHEXXI   1.8-1-0.4 % GEL, Place 1 Application vaginally daily as needed (contraceptive). Spermicidal, Disp: , Rfl:    sertraline  (ZOLOFT ) 100 MG tablet, Take 1 tablet (100 mg total) by mouth daily., Disp: 90 tablet, Rfl: 3   Physical Exam:   LMP  (LMP Unknown) Comment: Off depoprovera but no period yet  Pertinent Findings  CN II-XII intact Bilateral serious effusion, TMs with no erythema, right TM with area of healed TM along superior portion  Weber 512: equal Rinne 512: AC > BC b/l  Anterior rhinoscopy: Septum right deviation; bilateral inferior turbinates with no nasal hypertrophy No lesions of oral cavity/oropharynx; dentition WNL No obviously palpable neck masses/lymphadenopathy/thyromegaly No respiratory distress or stridor  Seprately Identifiable Procedures:  None  Impression & Plans:  Viviane Garate is a 38 y.o. female with the following   Right sided otitis media with TM rupture-  The patient is doing well today. Based on history and physical exam it does appear that she had ruptured right TM secondary to acute otitis media. The TM has healed, no significant hearing deficits. On exam it does appear she has serous effusion. I would like her to return in 2-3 months for repeat evaluation with audiological evaluation, She may return sooner if needed. I would recommend continued use of Flonase  and Xyzal.   flu   - f/u 2-3 months with repeat audio    Thank you for allowing me the opportunity to care for your patient. Please do not hesitate to contact me should you have any other questions.  Sincerely, Belma Boxer PA-C [facility] Phone: [phone] Fax: [phone]  09/15/2023, 2:24 PM

## 2023-09-18 DIAGNOSIS — F411 Generalized anxiety disorder: Secondary | ICD-10-CM | POA: Diagnosis not present

## 2023-09-19 ENCOUNTER — Encounter: Payer: Self-pay | Admitting: Audiology

## 2023-09-22 ENCOUNTER — Encounter: Payer: Self-pay | Admitting: Dermatology

## 2023-09-22 ENCOUNTER — Ambulatory Visit: Admitting: Dermatology

## 2023-09-22 VITALS — BP 127/76 | HR 90

## 2023-09-22 DIAGNOSIS — T8131XA Disruption of external operation (surgical) wound, not elsewhere classified, initial encounter: Secondary | ICD-10-CM | POA: Diagnosis not present

## 2023-09-22 DIAGNOSIS — L905 Scar conditions and fibrosis of skin: Secondary | ICD-10-CM | POA: Diagnosis not present

## 2023-09-22 NOTE — Progress Notes (Signed)
   Follow-Up Visit   Subjective  Brianna Griffin is a 38 y.o. female who presents for the following: concerned about healing from a previous excision. Some irritation and tenderness in the center of the scar.   The patient has spots, moles and lesions to be evaluated, some may be new or changing.  The following portions of the chart were reviewed this encounter and updated as appropriate: medications, allergies, medical history  Review of Systems:  No other skin or systemic complaints except as noted in HPI or Assessment and Plan.  Objective  Well appearing patient in no apparent distress; mood and affect are within normal limits.   A focused examination was performed of the following areas: Left posterior neck  Relevant exam findings are noted in the Assessment and Plan.    Assessment & Plan   Scar s/p WLE for EIC on left posterior neck, treated on 06/02/2023, repaired with linear closure - Reassured that wound has healed well - Discussed that scars take up to 12 months to mature from the date of surgery - Recommend SPF 30+ to scar daily to prevent purple color - OK to start scar massage at 4-6 weeks post-op - Can consider silicone based products for scar healing  Spitting suture Exam: irritated patch  Treatment Plan: Removed suture   Return if symptoms worsen or fail to improve.  I, Wilson Hasten, CMA, am acting as scribe for Deneise Finlay, MD.   Documentation: I have reviewed the above documentation for accuracy and completeness, and I agree with the above.  Deneise Finlay, MD

## 2023-09-22 NOTE — Patient Instructions (Signed)

## 2023-09-23 NOTE — Progress Notes (Signed)
 [facility] [address], [place] [address]   Clinic Day:  09/24/2023  Referring physician: Cook, Jayce G, DO  Patient Care Team: Cook, Jayce G, DO as PCP - General (Family Medicine)   ASSESSMENT & PLAN:   Assessment:  1.  Cryptogenic stroke: - Patient seen at the request of Dr. Debrah Fan for evaluation of hypercoagulable state. - She had left thalamic infarct (5 mm) when she presented to the ER with right-sided numbness and weakness. - 2D echo was normal.  TEE with no clot or PFO.  Suspicious for small secundum ASD but no right-to-left shunt with saline injection. - No B symptoms.  Non-smoker. - Hospitalization workup: LA negative, negative for anticardiolipin and antibeta 2 glycoprotein 1 antibodies.  Normal protein C, protein S and ATIII levels.  [place] [address] mutation negative. - She continues to have right-sided (right cheek, upper and lower extremity) tingling and heaviness, but strength has improved.  2.  Social/family history: - Non-smoker.  Had 3 first trimester miscarriages. - No family history of DVT/PE.  Few family members had stroke after age 73-70. - Maternal grandfather had bladder cancer metastatic to liver.  Maternal great uncle had prostate cancer.  Paternal uncle had prostate cancer.  Maternal uncle had melanoma.  Paternal aunt had lung cancer.  Plan:  1.  Cryptogenic stroke: - I have reviewed results of JAK2 V617F and reflex testing which were negative. - C3, C4, CH 50, VWF antigen levels were normal.  Cryoglobulins were not detected. - ANCA profile showed atypical p-ANCA 1:80 titer. - ANA was positive, 1:160 dilution.  It showed dense fine speckled pattern, most likely normal. - However given her cryptogenic stroke, I would refer her to rheumatology for further workup. - RTC as needed.  No follow-up appointment was given.   No orders of the defined types were placed in this encounter.     Nadeen Augusta Teague,acting as a  Neurosurgeon for Paulett Boros, MD.,have documented all relevant documentation on the behalf of Paulett Boros, MD,as directed by  Paulett Boros, MD while in the presence of Paulett Boros, MD.  I, Paulett Boros MD, have reviewed the above documentation for accuracy and completeness, and I agree with the above.    Paulett Boros, MD   5/7/202512:04 PM  CHIEF COMPLAINT/PURPOSE OF CONSULT:   Diagnosis: Cryptogenic stroke  Current Therapy: Under workup  HISTORY OF PRESENT ILLNESS:   Nanita is a 38 y.o. female presenting to clinic today for evaluation of ischemic stroke at the request of Cook, Jayce G, DO.  Patient has a medical history of asthma, IBS, GERD, and hyperlipidemia. She saw her PCP on 08/28/23 for follow-up after hospitalization from 08/24/23 to 08/27/23 due to an ischemic stroke. MRI brain showed 5 mm subacute nonhemorrhagic infarct of the lateral thalamus, as well as 14 x 12 x 15 mm dural based enhancing mass lesion at the right CP angle consistent with a meningioma. CTA head showed: 12 x 10 x 13 mm enhancing dural-based mass lesion at the porous acusticus and extending inferiorly. No DVTs were found on further imaging. While hospitalized, she had a transesophageal loop recorder inserted. TCD bubble study was negative. Hypercoagulable state was negative and she was put on Plavix  for 3 weeks and aspirin .   Ju has out patient follow-up with neurology for meningioma.   Today, she states that she is doing well overall. Her appetite level is at 100%. Her energy level is at 75%.  INTERVAL HISTORY:  Aliayah Bazil is a 38 y.o. female presenting to the clinic today for follow-up of cryptogenic stroke. She was last seen by me on 09/03/23 in consultation.  Today, she states that she is doing well overall. Her appetite level is at 100%. Her energy level is at 7 5%.   PAST MEDICAL HISTORY:   Past Medical History: Past Medical History:  Diagnosis Date    Allergy    Anxiety    Asthma    Depression    Gastritis    Gastroesophageal reflux disease with esophagitis 09/19/2014   GERD (gastroesophageal reflux disease)    Hymenal remnant 07/19/2014   IBS (irritable bowel syndrome)    Migraine headache    Migraines    Miscarriage 09/09/2013   Thyroid  disease    hypothyroid   Vaginal Pap smear, abnormal     Surgical History: Past Surgical History:  Procedure Laterality Date   CYST REMOVAL NECK     LOOP RECORDER INSERTION N/A 08/27/2023   Procedure: LOOP RECORDER INSERTION;  Surgeon: Tylene Galla, PA-C;  Location: [facility];  Service: Cardiovascular;  Laterality: N/A;   TRANSESOPHAGEAL ECHOCARDIOGRAM (CATH LAB) N/A 08/26/2023   Procedure: TRANSESOPHAGEAL ECHOCARDIOGRAM;  Surgeon: Jerryl Morin, DO;  Location: [facility];  Service: Cardiovascular;  Laterality: N/A;    Social History: Social History   Socioeconomic History   Marital status: Married    Spouse name: Karagan Sirkin   Number of children: Not on file   Years of education: Not on file   Highest education level: Associate degree: academic program  Occupational History   Not on file  Tobacco Use   Smoking status: Never   Smokeless tobacco: Never  Vaping Use   Vaping status: Never Used  Substance and Sexual Activity   Alcohol use: Yes    Comment: rare   Drug use: No   Sexual activity: Yes    Birth control/protection: Injection  Other Topics Concern   Not on file  Social History Narrative   Not on file   Social Drivers of Health   Financial Resource Strain: Low Risk  (08/18/2023)   Overall Financial Resource Strain (CARDIA)    Difficulty of Paying Living Expenses: Not very hard  Food Insecurity: No Food Insecurity (08/25/2023)   Hunger Vital Sign    Worried About Running Out of Food in the Last Year: Never true    Ran Out of Food in the Last Year: Never true  Transportation Needs: No Transportation Needs (08/25/2023)   PRAPARE - Therapist, art (Medical): No    Lack of Transportation (Non-Medical): No  Physical Activity: Inactive (08/18/2023)   Exercise Vital Sign    Days of Exercise per Week: 0 days    Minutes of Exercise per Session: 0 min  Stress: Stress Concern Present (08/18/2023)   [employer] of Occupational Health - Occupational Stress Questionnaire    Feeling of Stress : Rather much  Social Connections: Socially Integrated (08/18/2023)   Social Connection and Isolation Panel [NHANES]    Frequency of Communication with Friends and Family: Twice a week    Frequency of Social Gatherings with Friends and Family: Once a week    Attends Religious Services: More than 4 times per year    Active Member of [employer] or Organizations: Yes    Attends Banker Meetings: More than 4 times per year    Marital Status: Married  Catering manager Violence: Not At Risk (08/25/2023)  Humiliation, Afraid, Rape, and Kick questionnaire    Fear of Current or Ex-Partner: No    Emotionally Abused: No    Physically Abused: No    Sexually Abused: No    Family History: Family History  Problem Relation Age of Onset   Diabetes Maternal Grandmother    Hypertension Maternal Grandmother    Cancer Maternal Grandfather        bladder and liver   Crohn's disease Maternal Grandfather    Diabetes Father    Hypertension Father    Post-traumatic stress disorder Father    ADD / ADHD Brother    Depression Brother    ADD / ADHD Sister    Depression Sister    ADD / ADHD Daughter    Epilepsy Daughter    Epilepsy Son    ADD / ADHD Son    Autism Son     Current Medications:  Current Outpatient Medications:    acetaminophen  (TYLENOL ) 500 MG tablet, Take 1,000 mg by mouth every 6 (six) hours as needed for mild pain (pain score 1-3)., Disp: , Rfl:    albuterol  (VENTOLIN  HFA) 108 (90 Base) MCG/ACT inhaler, Inhale 2 puffs into the lungs every 6 (six) hours as needed for wheezing or shortness of breath., Disp: 6.7  g, Rfl: 0   aspirin  EC 81 MG tablet, Take 1 tablet (81 mg total) by mouth daily with breakfast. Swallow whole., Disp: 90 tablet, Rfl: 3   atorvastatin  (LIPITOR) 40 MG tablet, Take 1 tablet (40 mg total) by mouth daily., Disp: 90 tablet, Rfl: 0   buPROPion  (WELLBUTRIN  XL) 300 MG 24 hr tablet, Take 1 tablet (300 mg total) by mouth at bedtime. (Patient taking differently: Take 300 mg by mouth daily.), Disp: 90 tablet, Rfl: 3   Calcium  Carbonate Antacid (TUMS PO), Take 1 tablet by mouth daily as needed (heartburn)., Disp: , Rfl:    famotidine  (PEPCID ) 20 MG tablet, Take 1 tablet (20 mg total) by mouth 2 (two) times daily as needed for acid reflux, Disp: 180 tablet, Rfl: 0   fluticasone  (FLONASE ) 50 MCG/ACT nasal spray, Place 2 sprays into both nostrils daily., Disp: 16 g, Rfl: 6   ondansetron  (ZOFRAN -ODT) 4 MG disintegrating tablet, Dissolve 1 tablet (4 mg total) by mouth every 8 (eight) hours as needed for nausea., Disp: 30 tablet, Rfl: 0   PHEXXI  1.8-1-0.4 % GEL, Place 1 Application vaginally daily as needed (contraceptive). Spermicidal, Disp: , Rfl:    sertraline  (ZOLOFT ) 100 MG tablet, Take 1 tablet (100 mg total) by mouth daily., Disp: 90 tablet, Rfl: 3   Allergies: Allergies  Allergen Reactions   Ibuprofen  Hives    REVIEW OF SYSTEMS:   Review of Systems  Constitutional:  Negative for chills, fatigue and fever.  HENT:   Negative for lump/mass, mouth sores, nosebleeds, sore throat and trouble swallowing.   Eyes:  Negative for eye problems.  Respiratory:  Negative for cough and shortness of breath.   Cardiovascular:  Negative for chest pain, leg swelling and palpitations.  Gastrointestinal:  Negative for abdominal pain, constipation, diarrhea, nausea and vomiting.  Genitourinary:  Negative for bladder incontinence, difficulty urinating, dysuria, frequency, hematuria and nocturia.   Musculoskeletal:  Negative for arthralgias, back pain, flank pain, myalgias and neck pain.  Skin:  Negative  for itching and rash.  Neurological:  Negative for dizziness, headaches and numbness (Right side).  Hematological:  Does not bruise/bleed easily.  Psychiatric/Behavioral:  Negative for depression, sleep disturbance and suicidal ideas. The patient is not nervous/anxious.  All other systems reviewed and are negative.    VITALS:   Blood pressure 125/79, pulse 97, temperature 97.6 F (36.4 C), temperature source Tympanic, resp. rate 18, height 5\' 4"  (1.626 m), weight 151 lb (68.5 kg), SpO2 98%.  Wt Readings from Last 3 Encounters:  09/24/23 151 lb (68.5 kg)  09/15/23 150 lb (68 kg)  09/03/23 150 lb (68 kg)    Body mass index is 25.92 kg/m.   PHYSICAL EXAM:   Physical Exam Vitals and nursing note reviewed. Exam conducted with a chaperone present.  Constitutional:      Appearance: Normal appearance.  Cardiovascular:     Rate and Rhythm: Normal rate and regular rhythm.     Pulses: Normal pulses.     Heart sounds: Normal heart sounds.  Pulmonary:     Effort: Pulmonary effort is normal.     Breath sounds: Normal breath sounds.  Abdominal:     Palpations: Abdomen is soft. There is no hepatomegaly, splenomegaly or mass.     Tenderness: There is no abdominal tenderness.  Musculoskeletal:     Right lower leg: No edema.     Left lower leg: No edema.  Lymphadenopathy:     Cervical: No cervical adenopathy.     Right cervical: No superficial, deep or posterior cervical adenopathy.    Left cervical: No superficial, deep or posterior cervical adenopathy.     Upper Body:     Right upper body: No supraclavicular or axillary adenopathy.     Left upper body: No supraclavicular or axillary adenopathy.  Neurological:     General: No focal deficit present.     Mental Status: She is alert and oriented to person, place, and time.  Psychiatric:        Mood and Affect: Mood normal.        Behavior: Behavior normal.     LABS:   CBC    Component Value Date/Time   WBC 6.9 08/23/2023 1051    RBC 4.57 08/23/2023 1051   HGB 13.4 08/23/2023 1051   HGB 14.1 10/12/2021 0810   HCT 40.3 08/23/2023 1051   HCT 40.0 10/12/2021 0810   PLT 204 08/23/2023 1051   PLT 262 10/12/2021 0810   MCV 88.2 08/23/2023 1051   MCV 88 10/12/2021 0810   MCH 29.3 08/23/2023 1051   MCHC 33.3 08/23/2023 1051   RDW 13.4 08/23/2023 1051   RDW 12.7 10/12/2021 0810   LYMPHSABS 1.4 08/23/2023 1051   LYMPHSABS 1.9 06/14/2020 1651   MONOABS 0.3 08/23/2023 1051   EOSABS 0.1 08/23/2023 1051   EOSABS 0.1 06/14/2020 1651   BASOSABS 0.0 08/23/2023 1051   BASOSABS 0.0 06/14/2020 1651    CMP    Component Value Date/Time   NA 136 08/23/2023 1051   NA 139 10/12/2021 0810   K 4.0 08/23/2023 1051   CL 103 08/23/2023 1051   CO2 24 08/23/2023 1051   GLUCOSE 96 08/23/2023 1051   BUN 9 08/23/2023 1051   BUN 11 10/12/2021 0810   CREATININE 0.75 08/23/2023 1051   CALCIUM  9.4 08/23/2023 1051   PROT 7.2 10/12/2021 0810   ALBUMIN 4.7 10/12/2021 0810   AST 12 10/12/2021 0810   ALT 13 10/12/2021 0810   ALKPHOS 111 10/12/2021 0810   BILITOT 0.6 10/12/2021 0810   GFRNONAA >60 08/23/2023 1051   GFRAA 138 06/14/2020 1651    No results found for: "CEA1", "CEA" / No results found for: "CEA1", "CEA" No results found for: "PSA1" No results found  for: "CAN199" No results found for: "CAN125"  No results found for: "TOTALPROTELP", "ALBUMINELP", "A1GS", "A2GS", "BETS", "BETA2SER", "GAMS", "MSPIKE", "SPEI" No results found for: "TIBC", "FERRITIN", "IRONPCTSAT" No results found for: "LDH"   STUDIES:   VAS US  TRANSCRANIAL DOPPLER W BUBBLES Result Date: 09/07/2023  Transcranial Doppler with Bubble Patient Name:  ETHELENE STEKETEE  Date of Exam:   08/25/2023 Medical Rec #: [id number]        Accession #:    [id number] Date of Birth: [birth year]        Patient Gender: F Patient Age:   35 years Exam Location:  [facility] Procedure:      VAS US  TRANSCRANIAL DOPPLER W BUBBLES Referring Phys: PRAMOD SETHI  --------------------------------------------------------------------------------  Indications: Stroke. History: History of migraines. Comparison Study: No prior study on file Performing Technologist: Carleene Chase RVS  Examination Guidelines: A complete evaluation includes B-mode imaging, spectral Doppler, color Doppler, and power Doppler as needed of all accessible portions of each vessel. Bilateral testing is considered an integral part of a complete examination. Limited examinations for reoccurring indications may be performed as noted.  Summary: No HITS at rest or during Valsalva. Negative transcranial Doppler Bubble study with no evidence of right to left intracardiac communication.  A vascular evaluation was performed. The left middle cerebral artery was studied. An IV was inserted into the patient's left AC. Verbal informed consent was obtained.  *See table(s) above for TCD measurements and observations.  Diagnosing physician: Ardella Beaver MD Electronically signed by Ardella Beaver MD on 09/07/2023 at 10:47:01 AM.    Final    EP PPM/ICD IMPLANT Result Date: 08/27/2023 CONCLUSIONS:  1. Successful implantation of a implantable loop recorder for a history of cryptogenic stroke  2. No early apparent complications. Tylene Galla, PA-C Cardiac Electrophysiology   ECHO TEE Result Date: 08/26/2023    TRANSESOPHOGEAL ECHO REPORT   Patient Name:   SHAMELL FERRARE Date of Exam: 08/26/2023 Medical Rec #:  [id number]       Height:       64.0 in Accession #:    [id number]      Weight:       155.0 lb Date of Birth:  11-29-1985       BSA:          1.756 m Patient Age:    37 years        BP:           124/89 mmHg Patient Gender: F               HR:           86 bpm. Exam Location:  Inpatient Procedure: 3D Echo, Transesophageal Echo, Cardiac Doppler, Color Doppler and            Saline Contrast Bubble Study (Both Spectral and Color Flow Doppler            were utilized during procedure). Indications:     Stroke I63.9   History:         Patient has prior history of Echocardiogram examinations, most                  recent 08/24/2023. Stroke and Migraine.  Sonographer:     Terrilee Few RCS Referring Phys:  [id number] Dellis Fermo Diagnosing Phys: Kardie Tobb DO PROCEDURE: After discussion of the risks and benefits of a TEE, an informed consent was obtained from the patient. The transesophogeal probe was passed without difficulty through the esophogus of  the patient. Imaged were obtained with the patient in a left lateral decubitus position. Sedation performed by different physician. The patient was monitored while under deep sedation. Anesthestetic sedation was provided intravenously by Anesthesiology: 454.31mg  of Propofol . The patient developed no complications during the procedure.  IMPRESSIONS  1. Very small defect (0.189 cm) seen suspicious for secundum atrial septal defect. Evidence of atrial level shunting detected by color flow Doppler. Agitated saline contrast bubble study was negative, with no evidence of any interatrial shunt.  2. Left ventricular ejection fraction, by estimation, is 60 to 65%. The left ventricle has normal function.  3. Right ventricular systolic function is normal. The right ventricular size is normal.  4. No left atrial/left atrial appendage thrombus was detected. The LAA emptying velocity was 41 cm/s.  5. Mild anterior leaflet prolaspse. The mitral valve is abnormal. Mild mitral valve regurgitation. No evidence of mitral stenosis.  6. 3D performed of the mitral valve and demonstrates mitral valve.  7. The aortic valve is normal in structure. Aortic valve regurgitation is not visualized. FINDINGS  Left Ventricle: Left ventricular ejection fraction, by estimation, is 60 to 65%. The left ventricle has normal function. The left ventricular internal cavity size was normal in size. Right Ventricle: The right ventricular size is normal. No increase in right ventricular wall thickness. Right ventricular systolic  function is normal. Left Atrium: Left atrial size was normal in size. No left atrial/left atrial appendage thrombus was detected. The LAA emptying velocity was 41 cm/s. Right Atrium: Right atrial size was normal in size. Pericardium: There is no evidence of pericardial effusion. Mitral Valve: Mild anterior leaflet prolaspse. The mitral valve is abnormal. Mild mitral valve regurgitation. No evidence of mitral valve stenosis. Tricuspid Valve: The tricuspid valve is normal in structure. Tricuspid valve regurgitation is trivial. No evidence of tricuspid stenosis. Aortic Valve: The aortic valve is normal in structure. Aortic valve regurgitation is not visualized. Pulmonic Valve: The pulmonic valve was not well visualized. Pulmonic valve regurgitation is trivial. No evidence of pulmonic stenosis. Aorta: The aortic root and ascending aorta are structurally normal, with no evidence of dilitation. Venous: The left upper pulmonary vein, left lower pulmonary vein, right upper pulmonary vein and right lower pulmonary vein are normal. A normal flow pattern is recorded from the left upper pulmonary vein. IAS/Shunts: Evidence of atrial level shunting detected by color flow Doppler. Agitated saline contrast was given intravenously to evaluate for intracardiac shunting. Agitated saline contrast bubble study was negative, with no evidence of any interatrial shunt. Very small defect (0.189 cm) seen suspicious for secundum atrial septal defect. Additional Comments: Spectral Doppler performed. LEFT VENTRICLE PLAX 2D LVOT diam:     2.00 cm LVOT Area:     3.14 cm   AORTA Ao Root diam: 2.50 cm Ao Asc diam:  2.55 cm  SHUNTS Systemic Diam: 2.00 cm Kardie Tobb DO Electronically signed by Jerryl Morin DO Signature Date/Time: 08/26/2023/2:37:13 PM    Final    EP STUDY Result Date: 08/26/2023 See surgical note for result.  VAS US  LOWER EXTREMITY VENOUS (DVT) Result Date: 08/25/2023  Lower Venous DVT Study Patient Name:  DEVION OKONSKI  Date  of Exam:   08/25/2023 Medical Rec #: [id number]        Accession #:    [id number] Date of Birth: 1985-12-06        Patient Gender: F Patient Age:   59 years Exam Location:  [facility] Procedure:      VAS US  LOWER EXTREMITY  VENOUS (DVT) Referring Phys: PRAMOD SETHI --------------------------------------------------------------------------------  Indications: Stroke.  Comparison Study: No prior study on file Performing Technologist: Carleene Chase RVS  Examination Guidelines: A complete evaluation includes B-mode imaging, spectral Doppler, color Doppler, and power Doppler as needed of all accessible portions of each vessel. Bilateral testing is considered an integral part of a complete examination. Limited examinations for reoccurring indications may be performed as noted. The reflux portion of the exam is performed with the patient in reverse Trendelenburg.  +---------+---------------+---------+-----------+----------+--------------+ RIGHT    CompressibilityPhasicitySpontaneityPropertiesThrombus Aging +---------+---------------+---------+-----------+----------+--------------+ CFV      Full           Yes      Yes                                 +---------+---------------+---------+-----------+----------+--------------+ SFJ      Full                                                        +---------+---------------+---------+-----------+----------+--------------+ FV Prox  Full                                                        +---------+---------------+---------+-----------+----------+--------------+ FV Mid   Full                                                        +---------+---------------+---------+-----------+----------+--------------+ FV DistalFull                                                        +---------+---------------+---------+-----------+----------+--------------+ PFV      Full           Yes      Yes                                  +---------+---------------+---------+-----------+----------+--------------+ POP      Full           Yes      Yes                                 +---------+---------------+---------+-----------+----------+--------------+ PTV      Full                                                        +---------+---------------+---------+-----------+----------+--------------+ PERO     Full                                                        +---------+---------------+---------+-----------+----------+--------------+   +---------+---------------+---------+-----------+----------+--------------+  LEFT     CompressibilityPhasicitySpontaneityPropertiesThrombus Aging +---------+---------------+---------+-----------+----------+--------------+ CFV      Full           Yes      Yes                                 +---------+---------------+---------+-----------+----------+--------------+ SFJ      Full                                                        +---------+---------------+---------+-----------+----------+--------------+ FV Prox  Full                                                        +---------+---------------+---------+-----------+----------+--------------+ FV Mid   Full                                                        +---------+---------------+---------+-----------+----------+--------------+ FV DistalFull                                                        +---------+---------------+---------+-----------+----------+--------------+ PFV      Full                                                        +---------+---------------+---------+-----------+----------+--------------+ POP      Full           Yes      Yes                                 +---------+---------------+---------+-----------+----------+--------------+ PTV      Full                                                         +---------+---------------+---------+-----------+----------+--------------+ PERO     Full                                                        +---------+---------------+---------+-----------+----------+--------------+     Summary: BILATERAL: - No evidence of deep vein thrombosis seen in the lower extremities, bilaterally. -No evidence of popliteal cyst, bilaterally.   *See table(s) above for measurements and observations. Electronically signed by Irvin Mantel on 08/25/2023 at 7:31:04 PM.  Final

## 2023-09-24 ENCOUNTER — Other Ambulatory Visit (HOSPITAL_COMMUNITY): Payer: Self-pay

## 2023-09-24 ENCOUNTER — Inpatient Hospital Stay: Attending: Hematology | Admitting: Hematology

## 2023-09-24 ENCOUNTER — Other Ambulatory Visit: Payer: Self-pay

## 2023-09-24 ENCOUNTER — Other Ambulatory Visit: Payer: Self-pay | Admitting: Adult Health

## 2023-09-24 VITALS — BP 125/79 | HR 97 | Temp 97.6°F | Resp 18 | Ht 64.0 in | Wt 151.0 lb

## 2023-09-24 DIAGNOSIS — Z8052 Family history of malignant neoplasm of bladder: Secondary | ICD-10-CM | POA: Insufficient documentation

## 2023-09-24 DIAGNOSIS — Z801 Family history of malignant neoplasm of trachea, bronchus and lung: Secondary | ICD-10-CM | POA: Insufficient documentation

## 2023-09-24 DIAGNOSIS — I639 Cerebral infarction, unspecified: Secondary | ICD-10-CM | POA: Diagnosis not present

## 2023-09-24 DIAGNOSIS — Z8042 Family history of malignant neoplasm of prostate: Secondary | ICD-10-CM | POA: Insufficient documentation

## 2023-09-24 DIAGNOSIS — F331 Major depressive disorder, recurrent, moderate: Secondary | ICD-10-CM

## 2023-09-24 DIAGNOSIS — Z808 Family history of malignant neoplasm of other organs or systems: Secondary | ICD-10-CM | POA: Diagnosis not present

## 2023-09-24 NOTE — Patient Instructions (Addendum)
 [facility] Discharge Instructions   You were seen and examined today by Dr. Cheree Cords.  He reviewed the results of your lab work which are mostly normal/stable. The only thing out of the ordinary was the ANA and Atypical pANCA.   We will refer you to a rheumatologist. You will not require any further follow up here unless a new need arises in the future.   Return as scheduled.    Thank you for choosing [facility] to provide your oncology and hematology care.  To afford each patient quality time with our provider, please arrive at least 15 minutes before your scheduled appointment time.   If you have a lab appointment with the Cancer Center please come in thru the Main Entrance and check in at the main information desk.  You need to re-schedule your appointment should you arrive 10 or more minutes late.  We strive to give you quality time with our providers, and arriving late affects you and other patients whose appointments are after yours.  Also, if you no show three or more times for appointments you may be dismissed from the clinic at the providers discretion.     Again, thank you for choosing [facility].  Our hope is that these requests will decrease the amount of time that you wait before being seen by our physicians.       _____________________________________________________________  Should you have questions after your visit to [facility], please contact our office at [phone] and follow the prompts.  Our office hours are 8:00 a.m. and 4:30 p.m. Monday - Friday.  Please note that voicemails left after 4:00 p.m. may not be returned until the following business day.  We are closed weekends and major holidays.  You do have access to a nurse 24-7, just call the main number to the clinic [phone] and do not press any options, hold on the line and a nurse will answer the phone.     For prescription refill requests, have your pharmacy contact our office and allow 72 hours.    Due to Covid, you will need to wear a mask upon entering the hospital. If you do not have a mask, a mask will be given to you at the Main Entrance upon arrival. For doctor visits, patients may have 1 support person age 26 or older with them. For treatment visits, patients can not have anyone with them due to social distancing guidelines and our immunocompromised population.

## 2023-09-27 ENCOUNTER — Other Ambulatory Visit: Payer: Self-pay | Admitting: Adult Health

## 2023-09-27 DIAGNOSIS — F331 Major depressive disorder, recurrent, moderate: Secondary | ICD-10-CM

## 2023-09-29 ENCOUNTER — Other Ambulatory Visit (HOSPITAL_COMMUNITY): Payer: Self-pay

## 2023-09-29 ENCOUNTER — Encounter: Payer: Self-pay | Admitting: Neurology

## 2023-09-29 ENCOUNTER — Ambulatory Visit: Payer: Self-pay | Admitting: Neurology

## 2023-09-29 VITALS — BP 116/68 | HR 71 | Ht 64.0 in | Wt 154.0 lb

## 2023-09-29 DIAGNOSIS — E7849 Other hyperlipidemia: Secondary | ICD-10-CM

## 2023-09-29 DIAGNOSIS — R202 Paresthesia of skin: Secondary | ICD-10-CM | POA: Diagnosis not present

## 2023-09-29 DIAGNOSIS — I6381 Other cerebral infarction due to occlusion or stenosis of small artery: Secondary | ICD-10-CM

## 2023-09-29 DIAGNOSIS — D329 Benign neoplasm of meninges, unspecified: Secondary | ICD-10-CM

## 2023-09-29 NOTE — Patient Instructions (Signed)
 I had a long d/w patient and her mother about her recent thalamic lacunar stroke, right CP angle meningioma ,risk for recurrent stroke/TIAs, personally independently reviewed imaging studies and stroke evaluation results and answered questions.Continue aspirin  81 mg daily  for secondary stroke prevention and maintain strict control of hypertension with blood pressure goal below 130/90, diabetes with hemoglobin A1c goal below 6.5% and lipids with LDL cholesterol goal below 70 mg/dL. I also advised the patient to eat a healthy diet with plenty of whole grains, cereals, fruits and vegetables, exercise regularly and maintain ideal body weight.  Check follow-up lipid profile.  Referral to neurosurgery for right CP angle meningioma to discuss treatment options.  Keep scheduled appointment with rheumatology to discuss elevated ANA.  Followup in the future with me in 6 months or call earlier if necessary.  Stroke Prevention Some medical conditions and behaviors can lead to a higher chance of having a stroke. You can help prevent a stroke by eating healthy, exercising, not smoking, and managing any medical conditions you have. Stroke is a leading cause of functional impairment. Primary prevention is particularly important because a majority of strokes are first-time events. Stroke changes the lives of not only those who experience a stroke but also their family and other caregivers. How can this condition affect me? A stroke is a medical emergency and should be treated right away. A stroke can lead to brain damage and can sometimes be life-threatening. If a person gets medical treatment right away, there is a better chance of surviving and recovering from a stroke. What can increase my risk? The following medical conditions may increase your risk of a stroke: Cardiovascular disease. High blood pressure (hypertension). Diabetes. High cholesterol. Sickle cell disease. Blood clotting disorders (hypercoagulable  state). Obesity. Sleep disorders (obstructive sleep apnea). Other risk factors include: Being older than age 75. Having a history of blood clots, stroke, or mini-stroke (transient ischemic attack, TIA). Genetic factors, such as race, ethnicity, or a family history of stroke. Smoking cigarettes or using other tobacco products. Taking birth control pills, especially if you also use tobacco. Heavy use of alcohol or drugs, especially cocaine and methamphetamine. Physical inactivity. What actions can I take to prevent this? Manage your health conditions High cholesterol levels. Eating a healthy diet is important for preventing high cholesterol. If cholesterol cannot be managed through diet alone, you may need to take medicines. Take any prescribed medicines to control your cholesterol as told by your health care provider. Hypertension. To reduce your risk of stroke, try to keep your blood pressure below 130/80. Eating a healthy diet and exercising regularly are important for controlling blood pressure. If these steps are not enough to manage your blood pressure, you may need to take medicines. Take any prescribed medicines to control hypertension as told by your health care provider. Ask your health care provider if you should monitor your blood pressure at home. Have your blood pressure checked every year, even if your blood pressure is normal. Blood pressure increases with age and some medical conditions. Diabetes. Eating a healthy diet and exercising regularly are important parts of managing your blood sugar (glucose). If your blood sugar cannot be managed through diet and exercise, you may need to take medicines. Take any prescribed medicines to control your diabetes as told by your health care provider. Get evaluated for obstructive sleep apnea. Talk to your health care provider about getting a sleep evaluation if you snore a lot or have excessive sleepiness. Make sure that any  other  medical conditions you have, such as atrial fibrillation or atherosclerosis, are managed. Nutrition Follow instructions from your health care provider about what to eat or drink to help manage your health condition. These instructions may include: Reducing your daily calorie intake. Limiting how much salt (sodium) you use to 1,500 milligrams (mg) each day. Using only healthy fats for cooking, such as olive oil, canola oil, or sunflower oil. Eating healthy foods. You can do this by: Choosing foods that are high in fiber, such as whole grains, and fresh fruits and vegetables. Eating at least 5 servings of fruits and vegetables a day. Try to fill one-half of your plate with fruits and vegetables at each meal. Choosing lean protein foods, such as lean cuts of meat, poultry without skin, fish, tofu, beans, and nuts. Eating low-fat dairy products. Avoiding foods that are high in sodium. This can help lower blood pressure. Avoiding foods that have saturated fat, trans fat, and cholesterol. This can help prevent high cholesterol. Avoiding processed and prepared foods. Counting your daily carbohydrate intake.  Lifestyle If you drink alcohol: Limit how much you have to: 0-1 drink a day for women who are not pregnant. 0-2 drinks a day for men. Know how much alcohol is in your drink. In the [place] one drink equals one 12 oz bottle of beer ( ), one 5 oz glass of wine ( ), or one 1 oz glass of hard liquor (44mL). Do not use any products that contain nicotine or tobacco. These products include cigarettes, chewing tobacco, and vaping devices, such as e-cigarettes. If you need help quitting, ask your health care provider. Avoid secondhand smoke. Do not use drugs. Activity  Try to stay at a healthy weight. Get at least 30 minutes of exercise on most days, such as: Fast walking. Biking. Swimming. Medicines Take over-the-counter and prescription medicines only as told by your health care  provider. Aspirin  or blood thinners (antiplatelets or anticoagulants) may be recommended to reduce your risk of forming blood clots that can lead to stroke. Avoid taking birth control pills. Talk to your health care provider about the risks of taking birth control pills if: You are over 51 years old. You smoke. You get very bad headaches. You have had a blood clot. Where to find more information American Stroke Association: www.strokeassociation.org Get help right away if: You or a loved one has any symptoms of a stroke. "BE FAST" is an easy way to remember the main warning signs of a stroke: B - Balance. Signs are dizziness, sudden trouble walking, or loss of balance. E - Eyes. Signs are trouble seeing or a sudden change in vision. F - Face. Signs are sudden weakness or numbness of the face, or the face or eyelid drooping on one side. A - Arms. Signs are weakness or numbness in an arm. This happens suddenly and usually on one side of the body. S - Speech. Signs are sudden trouble speaking, slurred speech, or trouble understanding what people say. T - Time. Time to call emergency services. Write down what time symptoms started. You or a loved one has other signs of a stroke, such as: A sudden, severe headache with no known cause. Nausea or vomiting. Seizure. These symptoms may represent a serious problem that is an emergency. Do not wait to see if the symptoms will go away. Get medical help right away. Call your local emergency services (911 in the [place] Do not drive yourself to the hospital. Summary You can help to prevent  a stroke by eating healthy, exercising, not smoking, limiting alcohol intake, and managing any medical conditions you may have. Do not use any products that contain nicotine or tobacco. These include cigarettes, chewing tobacco, and vaping devices, such as e-cigarettes. If you need help quitting, ask your health care provider. Remember "BE FAST" for warning signs of a  stroke. Get help right away if you or a loved one has any of these signs. This information is not intended to replace advice given to you by your health care provider. Make sure you discuss any questions you have with your health care provider. Document Revised: 04/08/2022 Document Reviewed: 04/08/2022 Elsevier Patient Education  2024 [employer].

## 2023-09-29 NOTE — Progress Notes (Signed)
 [facility] [address] [place]. [place] [address] [phone]       OFFICE FOLLOW-UP NOTE  [name] Date of Birth:  22-Sep-1985 Medical Record Number:  [id number]   HPI: [name] is a pleasant 38 year old Caucasian lady seen today for initial office follow-up visit following hospital consultation for stroke in April 2025.  She is accompanied by her mother.  History is obtained from them and review of electronic medical records.  I personally reviewed pertinent available imaging films in PACS.  She has past medical history of migraines, gastroesophageal reflux disease, anxiety depression, recurrent miscarriages who presented on/6/25 to [facility] emergency room with sudden onset of headache right-sided numbness and difficulty walking.  She stated the headache had been present for almost a month.  She describes this as a tightness in the back of the neck.  While showering she is all of a sudden noticed numbness on the right side of the torso and arm as if it went to sleep.  She came to the ED for further evaluation and MRI scan of the brain showed a small left thalamic lacunar infarct.  CT angiogram of the brain and neck showed no large vessel stenosis or occlusion.  Transthoracic echo showed ejection fraction 60 to 65% with normal left atrial size.  TEE showed no evidence of clot or PFO.  There was suspicion for very small secundum ASD but no right-to-left shunt with saline injection.  LDL cholesterol 110 mg percent.  Hemoglobin A1c was 4.5.  Hypercoagulable panel labs were all negative.  She had a loop recorder inserted and so for paroxysmal A-fib has not yet been found.  ANA is positive and a titer of 1 is to 160 however ESR was normal.  Patient was seen by hematologist Dr. Cheree Cords and was not found to have any hypercoagulable disorder concerns.  She has been referred to rheumatology for positive ANA but has not yet seen them.  Her MRI of the brain was also  significant for right cerebellopontine angle meningioma and patient on inquiry does admit to slight decreased hearing in the right ear.  She has not yet been referred to neurosurgery.  She was seen by ENT who confirmed slight decrease hearing in the right but apparently did not talk to the patient about need for surgery or not.  Patient states she remains on aspirin  she is tolerating well with minor bruising and bleeding.  She is tolerating Lipitor well without muscle aches and pains.  She still has some residual numbness in the right 2 finger side of the torso but this is not disabling and she has gotten used to it.  She does have history of several miscarriages but she was able to carry to term pregnancies after that.  She denies history of DVT, pulm embolism, family history of strokes at a young age.  ROS:   14 system review of systems is positive for numbness, tingling, hearing loss all other systems negative  PMH:  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   IBS (irritable bowel syndrome)    Migraine headache    Migraines    Miscarriage 09/09/2013   Thyroid  disease    hypothyroid   Vaginal Pap smear, abnormal     Social History:  Social History   Socioeconomic History   Marital status: Married    Spouse name: Brianna Griffin   Number of children: Not on file   Years of education: Not on file   Highest education level: Associate degree: academic program  Occupational History   Not on file  Tobacco Use   Smoking status: Never   Smokeless tobacco: Never  Vaping Use   Vaping status: Never Used  Substance and Sexual Activity   Alcohol use: Yes    Comment: rare   Drug use: No   Sexual activity: Yes    Birth control/protection: Injection  Other Topics Concern   Not on file  Social History  Narrative   Not on file   Social Drivers of Health   Financial Resource Strain: Low Risk  (08/18/2023)   Overall Financial Resource Strain (CARDIA)    Difficulty of Paying Living Expenses: Not very hard  Food Insecurity: No Food Insecurity (08/25/2023)   Hunger Vital Sign    Worried About Running Out of Food in the Last Year: Never true    Ran Out of Food in the Last Year: Never true  Transportation Needs: No Transportation Needs (08/25/2023)   PRAPARE - Administrator, Civil Service (Medical): No    Lack of Transportation (Non-Medical): No  Physical Activity: Inactive (08/18/2023)   Exercise Vital Sign    Days of Exercise per Week: 0 days    Minutes of Exercise per Session: 0 min  Stress: Stress Concern Present (08/18/2023)   [employer] of Occupational Health - Occupational Stress Questionnaire    Feeling of Stress : Rather much  Social Connections: Socially Integrated (08/18/2023)   Social Connection and Isolation Panel [NHANES]    Frequency of Communication with Friends and Family: Twice a week    Frequency of Social Gatherings with Friends and Family: Once a week    Attends Religious Services: More than 4 times per year    Active Member of [employer] or Organizations: Yes    Attends Engineer, structural: More than 4 times per year    Marital Status: Married  Catering manager Violence: Not At Risk (08/25/2023)   Humiliation, Afraid, Rape, and Kick questionnaire    Fear of Current or Ex-Partner: No    Emotionally Abused: No    Physically Abused: No    Sexually Abused: No    Medications:   Current Outpatient Medications on File Prior to Visit  Medication Sig Dispense Refill   acetaminophen  (TYLENOL ) 500 MG tablet Take 1,000 mg by mouth every 6 (six) hours as needed for mild pain (pain score 1-3).     albuterol  (VENTOLIN  HFA) 108 (90 Base) MCG/ACT inhaler Inhale 2 puffs into the lungs every 6 (six) hours as needed for wheezing or shortness of breath. 6.7 g 0    aspirin  EC 81 MG tablet Take 1 tablet (81 mg total) by mouth daily with breakfast. Swallow whole. 90 tablet 3   atorvastatin  (LIPITOR) 40 MG tablet Take 1 tablet (40 mg total) by mouth daily. 90 tablet 0   buPROPion  (WELLBUTRIN  XL) 300 MG 24 hr tablet Take 1 tablet (300 mg total) by mouth at bedtime. (Patient taking differently: Take 300 mg by mouth daily.) 90 tablet 3   Calcium  Carbonate Antacid (TUMS PO) Take 1 tablet by mouth daily as needed (heartburn).     famotidine  (PEPCID ) 20 MG tablet Take 1 tablet (20 mg total) by mouth 2 (two) times daily as needed for acid reflux 180  tablet 0   fluticasone  (FLONASE ) 50 MCG/ACT nasal spray Place 2 sprays into both nostrils daily. 16 g 6   ondansetron  (ZOFRAN -ODT) 4 MG disintegrating tablet Dissolve 1 tablet (4 mg total) by mouth every 8 (eight) hours as needed for nausea. 30 tablet 0   PHEXXI  1.8-1-0.4 % GEL Place 1 Application vaginally daily as needed (contraceptive). Spermicidal     sertraline  (ZOLOFT ) 100 MG tablet Take 1 tablet (100 mg total) by mouth daily. 90 tablet 3   No current facility-administered medications on file prior to visit.    Allergies:   Allergies  Allergen Reactions   Ibuprofen  Hives    Physical Exam General: well developed, well nourished pleasant middle-aged Caucasian lady, seated, in no evident distress Head: head normocephalic and atraumatic.  Neck: supple with no carotid or supraclavicular bruits Cardiovascular: regular rate and rhythm, no murmurs Musculoskeletal: no deformity Skin:  no rash/petichiae Vascular:  Normal pulses all extremities Vitals:   09/29/23 1057  BP: 116/68  Pulse: 71   Neurologic Exam Mental Status: Awake and fully alert. Oriented to place and time. Recent and remote memory intact. Attention span, concentration and fund of knowledge appropriate. Mood and affect appropriate.  Cranial Nerves: Fundoscopic exam reveals sharp disc margins. Pupils equal, briskly reactive to light. Extraocular  movements full without nystagmus. Visual fields full to confrontation. Hearing slight decrease in air conduction in the right ear compared to the left.  Bone-conduction is similar in both ears.. Facial sensation intact. Face, tongue, palate moves normally and symmetrically.  Motor: Normal bulk and tone. Normal strength in all tested extremity muscles. Sensory.: intact to touch ,pinprick .position and vibratory sensation.  Coordination: Rapid alternating movements normal in all extremities. Finger-to-nose and heel-to-shin performed accurately bilaterally. Gait and Station: Arises from chair without difficulty. Stance is normal. Gait demonstrates normal stride length and balance . Able to heel, toe and tandem walk without difficulty.  Reflexes: 1+ and symmetric. Toes downgoing.   NIHSS  0 Modified Rankin  1   ASSESSMENT: 38 year old Caucasian lady with left thalamic lacunar infarct in April 2025 cryptogenic etiology.  Vascular risk factors of hyperlipidemia only.  Incidental right CP angle meningioma with mild right-sided hearing loss.     PLAN:I had a long d/w patient and her mother about her recent thalamic lacunar stroke, right CP angle meningioma ,risk for recurrent stroke/TIAs, personally independently reviewed imaging studies and stroke evaluation results and answered questions.Continue aspirin  81 mg daily  for secondary stroke prevention and maintain strict control of hypertension with blood pressure goal below 130/90, diabetes with hemoglobin A1c goal below 6.5% and lipids with LDL cholesterol goal below 70 mg/dL. I also advised the patient to eat a healthy diet with plenty of whole grains, cereals, fruits and vegetables, exercise regularly and maintain ideal body weight.  Check follow-up lipid profile.  Referral to neurosurgery for right CP angle meningioma to discuss treatment options.  Keep scheduled appointment with rheumatology to discuss elevated ANA.  Followup in the future with me in 6  months or call earlier if necessary.  Greater than 50% of time during this 40 minute visit was spent on counseling,explanation of diagnosis of thalamic stroke and right cerebellopontine angle meningioma, planning of further management, discussion with patient and family and coordination of care Ardella Beaver, MD Note: This document was prepared with digital dictation and possible smart phrase technology. Any transcriptional errors that result from this process are unintentional

## 2023-09-30 ENCOUNTER — Telehealth: Payer: Self-pay | Admitting: Neurology

## 2023-09-30 DIAGNOSIS — F411 Generalized anxiety disorder: Secondary | ICD-10-CM | POA: Diagnosis not present

## 2023-09-30 LAB — LIPID PANEL
Chol/HDL Ratio: 2.4 ratio (ref 0.0–4.4)
Cholesterol, Total: 112 mg/dL (ref 100–199)
HDL: 46 mg/dL (ref >39)
LDL Chol Calc (NIH): 46 mg/dL (ref 0–99)
Triglycerides: 109 mg/dL (ref 0–149)
VLDL Cholesterol Cal: 20 mg/dL (ref 5–40)

## 2023-09-30 NOTE — Telephone Encounter (Signed)
 Referral for neurosurgery fax to [facility]. Phone: [phone], Fax: [phone]

## 2023-10-01 ENCOUNTER — Ambulatory Visit (INDEPENDENT_AMBULATORY_CARE_PROVIDER_SITE_OTHER)

## 2023-10-01 DIAGNOSIS — I639 Cerebral infarction, unspecified: Secondary | ICD-10-CM | POA: Diagnosis not present

## 2023-10-02 LAB — CUP PACEART REMOTE DEVICE CHECK
Date Time Interrogation Session: 20250514220159
Implantable Pulse Generator Implant Date: 20250409

## 2023-10-04 ENCOUNTER — Ambulatory Visit: Payer: Self-pay | Admitting: Neurology

## 2023-10-04 NOTE — Progress Notes (Signed)
 Kindly inform the patient that cholesterol profile is quite satisfactory

## 2023-10-08 ENCOUNTER — Ambulatory Visit: Payer: Self-pay | Admitting: Cardiology

## 2023-10-16 DIAGNOSIS — F411 Generalized anxiety disorder: Secondary | ICD-10-CM | POA: Diagnosis not present

## 2023-10-21 ENCOUNTER — Encounter: Payer: Self-pay | Admitting: Family Medicine

## 2023-10-21 ENCOUNTER — Encounter: Payer: Self-pay | Admitting: Adult Health

## 2023-10-21 ENCOUNTER — Telehealth: Payer: Self-pay | Admitting: Adult Health

## 2023-10-21 ENCOUNTER — Other Ambulatory Visit: Payer: Self-pay

## 2023-10-21 ENCOUNTER — Ambulatory Visit: Admitting: Cardiology

## 2023-10-21 ENCOUNTER — Other Ambulatory Visit (HOSPITAL_COMMUNITY): Payer: Self-pay

## 2023-10-21 DIAGNOSIS — F411 Generalized anxiety disorder: Secondary | ICD-10-CM | POA: Diagnosis not present

## 2023-10-21 DIAGNOSIS — F331 Major depressive disorder, recurrent, moderate: Secondary | ICD-10-CM | POA: Diagnosis not present

## 2023-10-21 MED ORDER — SERTRALINE HCL 100 MG PO TABS
100.0000 mg | ORAL_TABLET | Freq: Every day | ORAL | 3 refills | Status: AC
  Filled 2023-10-21 – 2023-10-27 (×2): qty 90, 90d supply, fill #0
  Filled 2024-03-06: qty 90, 90d supply, fill #1
  Filled 2024-06-15: qty 90, 90d supply, fill #2

## 2023-10-21 NOTE — Progress Notes (Signed)
 Brianna Griffin [id number] 10/16/1985 37 y.o.  Virtual Visit via Video Note  I connected with pt @ on 10/21/23 at 12:00 PM EDT by a video enabled telemedicine application and verified that I am speaking with the correct person using two identifiers.   I discussed the limitations of evaluation and management by telemedicine and the availability of in person appointments. The patient expressed understanding and agreed to proceed.  I discussed the assessment and treatment plan with the patient. The patient was provided an opportunity to ask questions and all were answered. The patient agreed with the plan and demonstrated an understanding of the instructions.   The patient was advised to call back or seek an in-person evaluation if the symptoms worsen or if the condition fails to improve as anticipated.  I provided 25 minutes of non-face-to-face time during this encounter.  The patient was located at home.  The provider was located at [facility].   Reagan Camera, NP   Subjective:   Patient ID:  Brianna Griffin is a 38 y.o. (DOB 28-Feb-1986) female.  Chief Complaint: No chief complaint on file.   HPI Arelene Moroni presents for follow-up of MDD and GAD.  Describes mood today as "not the best". Pleasant. Denies tearful. Mood symptoms - reports depression, anxiety, and irritability - "it's been a struggle lately". Reports varying interest and motivation. Reports panic attacks - "some nights". Reports some worry, rumination and over thinking. Reports obsessive thoughts or acts - health and wellness about herself and family. Reports issues with anger and rage. Reports mood is lower. Stating "I don't feel like I'm doing all that great". Currently taking Zoloft  100mg  daily. Would like to consider treatment options for ADD symptoms.  Taking medications as prescribed.  Energy levels lower. Active, does not have a regular exercise routine. Enjoys some usual interests and activities.  Married. Lives with husband and 3 children - 24, 6 and 3 years. Spending time with family. Appetite adequate. Weight gain - 150 pounds Sleeps well most nights. Averages 7 to 8 hours. Focus and concentration difficulties - "brain fog and concentration issues following stroke". Completing tasks. Managing aspects of household. Works F/T for [employer]. Denies SI or HI.  Denies AH or VH. Denies self harm. Denies substance use.   Seeing therapist - Royal Cordon    Review of Systems:  Review of Systems  Musculoskeletal:  Negative for gait problem.  Neurological:  Negative for tremors.  Psychiatric/Behavioral:         Please refer to HPI    Medications: I have reviewed the patient's current medications.  Current Outpatient Medications  Medication Sig Dispense Refill   acetaminophen  (TYLENOL ) 500 MG tablet Take 1,000 mg by mouth every 6 (six) hours as needed for mild pain (pain score 1-3).     albuterol  (VENTOLIN  HFA) 108 (90 Base) MCG/ACT inhaler Inhale 2 puffs into the lungs every 6 (six) hours as needed for wheezing or shortness of breath. 6.7 g 0   aspirin  EC 81 MG tablet Take 1 tablet (81 mg total) by mouth daily with breakfast. Swallow whole. 90 tablet 3   atorvastatin  (LIPITOR) 40 MG tablet Take 1 tablet (40 mg total) by mouth daily. 90 tablet 0   buPROPion  (WELLBUTRIN  XL) 300 MG 24 hr tablet Take 1 tablet (300 mg total) by mouth at bedtime. (Patient taking differently: Take 300 mg by mouth daily.) 90 tablet 3   Calcium  Carbonate Antacid (TUMS PO) Take 1 tablet by mouth daily as needed (heartburn).  famotidine  (PEPCID ) 20 MG tablet Take 1 tablet (20 mg total) by mouth 2 (two) times daily as needed for acid reflux 180 tablet 0   fluticasone  (FLONASE ) 50 MCG/ACT nasal spray Place 2 sprays into both nostrils daily. 16 g 6   ondansetron  (ZOFRAN -ODT) 4 MG disintegrating tablet Dissolve 1 tablet (4 mg total) by mouth every 8 (eight) hours as needed for nausea. 30 tablet 0   PHEXXI   1.8-1-0.4 % GEL Place 1 Application vaginally daily as needed (contraceptive). Spermicidal     sertraline  (ZOLOFT ) 100 MG tablet Take 1 tablet (100 mg total) by mouth daily. 90 tablet 3   No current facility-administered medications for this visit.    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   IBS (irritable bowel syndrome)    Migraine headache    Migraines    Miscarriage 09/09/2013   Thyroid  disease    hypothyroid   Vaginal Pap smear, abnormal     Family History  Problem Relation Age of Onset   Diabetes Maternal Grandmother    Hypertension Maternal Grandmother    Cancer Maternal Grandfather        bladder and liver   Crohn's disease Maternal Grandfather    Diabetes Father    Hypertension Father    Post-traumatic stress disorder Father    ADD / ADHD Brother    Depression Brother    ADD / ADHD Sister    Depression Sister    ADD / ADHD Daughter    Epilepsy Daughter    Epilepsy Son    ADD / ADHD Son    Autism Son     Social History   Socioeconomic History   Marital status: Married    Spouse name: Raela Bohl   Number of children: Not on file   Years of education: Not on file   Highest education level: Associate degree: academic program  Occupational History   Not on file  Tobacco Use   Smoking status: Never   Smokeless tobacco: Never  Vaping Use   Vaping status: Never Used  Substance and Sexual Activity   Alcohol use: Yes    Comment: rare   Drug use: No   Sexual activity: Yes    Birth control/protection: Injection  Other Topics Concern   Not on file  Social History Narrative   Not on file   Social Drivers of Health   Financial Resource Strain: Low Risk   (08/18/2023)   Overall Financial Resource Strain (CARDIA)    Difficulty of Paying Living Expenses: Not very hard  Food Insecurity: No Food Insecurity (08/25/2023)   Hunger Vital Sign    Worried About Running Out of Food in the Last Year: Never true    Ran Out of Food in the Last Year: Never true  Transportation Needs: No Transportation Needs (08/25/2023)   PRAPARE - Administrator, Civil Service (Medical): No    Lack of Transportation (Non-Medical): No  Physical Activity: Inactive (08/18/2023)   Exercise Vital Sign    Days of Exercise per Week: 0 days    Minutes of Exercise per Session: 0 min  Stress: Stress Concern Present (08/18/2023)   [employer]  of Occupational Health - Occupational Stress Questionnaire    Feeling of Stress : Rather much  Social Connections: Socially Integrated (08/18/2023)   Social Connection and Isolation Panel [NHANES]    Frequency of Communication with Friends and Family: Twice a week    Frequency of Social Gatherings with Friends and Family: Once a week    Attends Religious Services: More than 4 times per year    Active Member of [employer] or Organizations: Yes    Attends Engineer, structural: More than 4 times per year    Marital Status: Married  Catering manager Violence: Not At Risk (08/25/2023)   Humiliation, Afraid, Rape, and Kick questionnaire    Fear of Current or Ex-Partner: No    Emotionally Abused: No    Physically Abused: No    Sexually Abused: No    Past Medical History, Surgical history, Social history, and Family history were reviewed and updated as appropriate.   Please see review of systems for further details on the patient's review from today.   Objective:   Physical Exam:  There were no vitals taken for this visit.  Physical Exam  Lab Review:     Component Value Date/Time   NA 136 08/23/2023 1051   NA 139 10/12/2021 0810   K 4.0 08/23/2023 1051   CL 103 08/23/2023 1051   CO2 24 08/23/2023 1051   GLUCOSE 96  08/23/2023 1051   BUN 9 08/23/2023 1051   BUN 11 10/12/2021 0810   CREATININE 0.75 08/23/2023 1051   CALCIUM  9.4 08/23/2023 1051   PROT 7.2 10/12/2021 0810   ALBUMIN 4.7 10/12/2021 0810   AST 12 10/12/2021 0810   ALT 13 10/12/2021 0810   ALKPHOS 111 10/12/2021 0810   BILITOT 0.6 10/12/2021 0810   GFRNONAA >60 08/23/2023 1051   GFRAA 138 06/14/2020 1651       Component Value Date/Time   WBC 6.9 08/23/2023 1051   RBC 4.57 08/23/2023 1051   HGB 13.4 08/23/2023 1051   HGB 14.1 10/12/2021 0810   HCT 40.3 08/23/2023 1051   HCT 40.0 10/12/2021 0810   PLT 204 08/23/2023 1051   PLT 262 10/12/2021 0810   MCV 88.2 08/23/2023 1051   MCV 88 10/12/2021 0810   MCH 29.3 08/23/2023 1051   MCHC 33.3 08/23/2023 1051   RDW 13.4 08/23/2023 1051   RDW 12.7 10/12/2021 0810   LYMPHSABS 1.4 08/23/2023 1051   LYMPHSABS 1.9 06/14/2020 1651   MONOABS 0.3 08/23/2023 1051   EOSABS 0.1 08/23/2023 1051   EOSABS 0.1 06/14/2020 1651   BASOSABS 0.0 08/23/2023 1051   BASOSABS 0.0 06/14/2020 1651    No results found for: "POCLITH", "LITHIUM"   No results found for: "PHENYTOIN", "PHENOBARB", "VALPROATE", "CBMZ"   .res Assessment: Plan:    Plan:  PDMP reviewed  D/C Wellbutrin  XL 300mg  every morning - denies seizure history   Continue Zoloft  100mg  daily  Hydroxyzine  25mg  up to 4 x times - using as needed  Genesight testing on file  Will discuss ADD medication after cardiology appointment in August.   Screening tools: [facility] ADD test 39/58 - ADD possible Connors positive for ADD inattentive type  RTC 3 months or after seen by cardiology.  25 minutes spent dedicated to the care of this patient on the date of this encounter to include pre-visit review of records, ordering of medication, post visit documentation, and face-to-face time with the patient discussing MDD and GAD. Discussed continuing current medication regimen.  Patient advised to  contact office with any questions, adverse  effects, or acute worsening in signs and symptoms.   There are no diagnoses linked to this encounter.   Please see After Visit Summary for patient specific instructions.  Future Appointments  Date Time Provider Department Center  10/21/2023 12:00 PM Ambrea Hegler, Ursula Gardner, NP CP-CP None  11/05/2023  7:05 AM CVD HVT DEVICE REMOTES [facility] H&V  12/10/2023  7:05 AM CVD HVT DEVICE REMOTES [facility] H&V  12/18/2023  9:00 AM Leroux-Martinez, Mirian Ames, AUD [facility] None  12/18/2023  9:30 AM Hedges, Susana Enter, PA-C [facility] None  12/22/2023 11:00 AM Kyra Phy, MD [facility] H&V  01/14/2024  7:05 AM CVD HVT DEVICE REMOTES [facility] H&V  02/18/2024  7:05 AM CVD HVT DEVICE REMOTES [facility] H&V  03/24/2024  7:05 AM CVD HVT DEVICE REMOTES [facility] H&V  04/22/2024  9:15 AM Dellar Fenton, DO CHD-DERM None  04/28/2024  7:05 AM CVD HVT DEVICE REMOTES [facility] H&V  07/08/2024  2:30 PM Lisabeth Rider, MD GNA-[facility]NA None    No orders of the defined types were placed in this encounter.     -------------------------------

## 2023-10-22 ENCOUNTER — Encounter: Payer: Self-pay | Admitting: Nurse Practitioner

## 2023-10-22 ENCOUNTER — Other Ambulatory Visit: Payer: Self-pay | Admitting: Nurse Practitioner

## 2023-10-23 ENCOUNTER — Other Ambulatory Visit: Payer: Self-pay | Admitting: Nurse Practitioner

## 2023-10-23 DIAGNOSIS — Z713 Dietary counseling and surveillance: Secondary | ICD-10-CM

## 2023-10-27 ENCOUNTER — Other Ambulatory Visit: Payer: Self-pay

## 2023-10-28 ENCOUNTER — Other Ambulatory Visit (HOSPITAL_COMMUNITY): Payer: Self-pay

## 2023-10-28 ENCOUNTER — Other Ambulatory Visit: Payer: Self-pay

## 2023-10-28 ENCOUNTER — Encounter: Payer: Self-pay | Admitting: Pharmacist

## 2023-10-28 DIAGNOSIS — F411 Generalized anxiety disorder: Secondary | ICD-10-CM | POA: Diagnosis not present

## 2023-10-29 ENCOUNTER — Encounter: Payer: Self-pay | Admitting: Cardiology

## 2023-10-29 ENCOUNTER — Ambulatory Visit: Attending: Cardiology | Admitting: Cardiology

## 2023-10-29 VITALS — BP 126/70 | HR 90 | Ht 64.0 in | Wt 160.6 lb

## 2023-10-29 DIAGNOSIS — I639 Cerebral infarction, unspecified: Secondary | ICD-10-CM

## 2023-10-29 DIAGNOSIS — R000 Tachycardia, unspecified: Secondary | ICD-10-CM | POA: Diagnosis not present

## 2023-10-29 NOTE — Progress Notes (Signed)
 Clinical Summary Brianna Griffin is a 38 y.o.female seen today for follow up of the following medical problems.   1.Cryptogenic stroke - admit 08/2023  -08/2023 TTE: LVEF 60-65%, no WMAs,  - loop recorder placed - 08/2023 TEE: very small defect 0.189 cm suspicious for secundum ASD, evidence of atrial level shunting. Agitated saline was negative. No LA thrombus - negative hypercoag workup by hematology  2.Tachycardia - narrow comlex tachycardia during recent admission described as atach based on EP note - no significant palpitations 09/2023 loop recorder check: no abnormal findings   3.Meningioma - from neuro notes has been referred to neurosurgery  4. +ANA - eval by rheumatology pending   SH: has 38 yo, 8yo, 3 yo children  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   IBS (irritable bowel syndrome)    Migraine headache    Migraines    Miscarriage 09/09/2013   Thyroid  disease    hypothyroid   Vaginal Pap smear, abnormal      Allergies  Allergen Reactions   Ibuprofen  Hives     Current Outpatient Medications  Medication Sig Dispense Refill   acetaminophen  (TYLENOL ) 500 MG tablet Take 1,000 mg by mouth every 6 (six) hours as needed for mild pain (pain score 1-3).     albuterol  (VENTOLIN  HFA) 108 (90 Base) MCG/ACT inhaler Inhale 2 puffs into the lungs every 6 (six) hours as needed for wheezing or shortness of breath. 6.7 g 0   aspirin  EC 81 MG tablet Take 1 tablet (81 mg total) by mouth daily with breakfast. Swallow whole. 90 tablet 3   atorvastatin  (LIPITOR) 40 MG tablet Take 1 tablet (40 mg total) by mouth daily. 90 tablet 0   Calcium  Carbonate Antacid (TUMS PO) Take 1 tablet by mouth daily as needed (heartburn).     famotidine  (PEPCID ) 20 MG tablet Take 1 tablet (20 mg total)  by mouth 2 (two) times daily as needed for acid reflux 180 tablet 0   fluticasone  (FLONASE ) 50 MCG/ACT nasal spray Place 2 sprays into both nostrils daily. 16 g 6   ondansetron  (ZOFRAN -ODT) 4 MG disintegrating tablet Dissolve 1 tablet (4 mg total) by mouth every 8 (eight) hours as needed for nausea. 30 tablet 0   PHEXXI  1.8-1-0.4 % GEL Place 1 Application vaginally daily as needed (contraceptive). Spermicidal     sertraline  (ZOLOFT ) 100 MG tablet Take 1 tablet (100 mg total) by mouth daily. 90 tablet 3   No current facility-administered medications for this visit.     Past Surgical History:  Procedure Laterality Date   CYST REMOVAL NECK     LOOP RECORDER INSERTION N/A 08/27/2023   Procedure: LOOP RECORDER INSERTION;  Surgeon: Tylene Galla, PA-C;  [facility] INVASIVE CV LAB;  Service: Cardiovascular;  Laterality: N/A;   TRANSESOPHAGEAL ECHOCARDIOGRAM (CATH LAB) N/A 08/26/2023   Procedure: TRANSESOPHAGEAL ECHOCARDIOGRAM;  Surgeon: Jerryl Morin, DO;  Location: [facility] INVASIVE CV LAB;  Service: Cardiovascular;  Laterality: N/A;     Allergies  Allergen Reactions   Ibuprofen  Hives      Family History  Problem Relation Age of Onset   Diabetes Maternal Grandmother    Hypertension Maternal Grandmother    Cancer Maternal Grandfather        bladder and liver  Crohn's disease Maternal Grandfather    Diabetes Father    Hypertension Father    Post-traumatic stress disorder Father    ADD / ADHD Brother    Depression Brother    ADD / ADHD Sister    Depression Sister    ADD / ADHD Daughter    Epilepsy Daughter    Epilepsy Son    ADD / ADHD Son    Autism Son      Social History Brianna Griffin reports that she has never smoked. She has never used smokeless tobacco. Brianna Griffin reports current alcohol use.   Physical Examination Today's Vitals   10/29/23 1047  BP: 126/70  Pulse: 90  SpO2: 98%  Weight: 160 lb 9.6 oz (72.8 kg)  Height: 5' 4 (1.626 m)  PainSc: 0-No pain    Body mass index is 27.57 kg/m.  Gen: resting comfortably, no acute distress HEENT: no scleral icterus, pupils equal round and reactive, no palptable cervical adenopathy,  CV: RRR, no m/rg, no jvd Resp: Clear to auscultation bilaterally GI: abdomen is soft, non-tender, non-distended, normal bowel sounds, no hepatosplenomegaly MSK: extremities are warm, no edema.  Skin: warm, no rash Neuro:  no focal deficits Psych: appropriate affect     Assessment and Plan  1.Cryptogenic stroke - ongoing workup per neuro - no evidence of cardiogenic cause. She has loop recorder in place. TEE questionable small 2mm ASA, on imaging review looks to be more of just thinning of the septum. Bubble study by TEE and transcranial Doppler were negative for intracardiac shunting.  - continue to follow loop recorder  2. Tachycardia - transient atach during recent admission - no specific symptoms - loop recorder first month of data without significant arrhythmias - continue to monitor - no contraindication to trial of ADHD meds if medically indicated - PR interval on manual measure right at low end of normal, she has no preexcitation on EKG, would not be of concern at this time.   F/u 6 months      Laurann Pollock, M.D.

## 2023-10-29 NOTE — Patient Instructions (Signed)
Medication Instructions:  Your physician recommends that you continue on your current medications as directed. Please refer to the Current Medication list given to you today.   Labwork: None today  Testing/Procedures: None today  Follow-Up: 6 months Dr.Branch  Any Other Special Instructions Will Be Listed Below (If Applicable).  If you need a refill on your cardiac medications before your next appointment, please call your pharmacy.

## 2023-11-04 ENCOUNTER — Telehealth: Payer: Self-pay | Admitting: Adult Health

## 2023-11-04 NOTE — Telephone Encounter (Signed)
 Patient saw Cardiology on 6/11 and note made that there is no contraindication for stimulant.  Tachycardia - transient atach during recent admission - no specific symptoms - loop recorder first month of data without significant arrhythmias - continue to monitor - no contraindication to trial of ADHD meds if medically indicated - PR interval on manual measure right at low end of normal, she has no preexcitation on EKG, would not be of concern at this time.   She was last on:   11/27/2021 11/27/2021 Adderall  Xr 20 Mg Capsule 30.00  Will pend as appropriate.

## 2023-11-04 NOTE — Telephone Encounter (Signed)
 Pt called to report 6/11 cardio apt. He made note stimulate should not be a problem. You can review notes in epic. Requesting to send Rx to  [facility]

## 2023-11-05 ENCOUNTER — Other Ambulatory Visit: Payer: Self-pay | Admitting: Nurse Practitioner

## 2023-11-05 ENCOUNTER — Ambulatory Visit (INDEPENDENT_AMBULATORY_CARE_PROVIDER_SITE_OTHER)

## 2023-11-05 ENCOUNTER — Other Ambulatory Visit: Payer: Self-pay

## 2023-11-05 DIAGNOSIS — I639 Cerebral infarction, unspecified: Secondary | ICD-10-CM | POA: Diagnosis not present

## 2023-11-05 DIAGNOSIS — D320 Benign neoplasm of cerebral meninges: Secondary | ICD-10-CM | POA: Diagnosis not present

## 2023-11-05 MED ORDER — FAMOTIDINE 20 MG PO TABS
20.0000 mg | ORAL_TABLET | Freq: Two times a day (BID) | ORAL | 0 refills | Status: DC
  Filled 2023-11-05: qty 180, 90d supply, fill #0

## 2023-11-06 ENCOUNTER — Telehealth

## 2023-11-06 ENCOUNTER — Other Ambulatory Visit: Payer: Self-pay

## 2023-11-06 ENCOUNTER — Other Ambulatory Visit (HOSPITAL_COMMUNITY): Payer: Self-pay

## 2023-11-06 ENCOUNTER — Ambulatory Visit: Payer: Self-pay | Admitting: Cardiology

## 2023-11-06 ENCOUNTER — Other Ambulatory Visit (HOSPITAL_COMMUNITY)

## 2023-11-06 DIAGNOSIS — B3731 Acute candidiasis of vulva and vagina: Secondary | ICD-10-CM | POA: Diagnosis not present

## 2023-11-06 LAB — CUP PACEART REMOTE DEVICE CHECK
Date Time Interrogation Session: 20250617234826
Implantable Pulse Generator Implant Date: 20250409

## 2023-11-06 MED ORDER — AMPHETAMINE-DEXTROAMPHET ER 20 MG PO CP24
20.0000 mg | ORAL_CAPSULE | Freq: Every day | ORAL | 0 refills | Status: DC
  Filled 2023-11-06: qty 30, 30d supply, fill #0

## 2023-11-06 NOTE — Telephone Encounter (Signed)
 Pended Adderall  XR 20 to [facility]

## 2023-11-07 ENCOUNTER — Other Ambulatory Visit (HOSPITAL_COMMUNITY)

## 2023-11-07 MED ORDER — FLUCONAZOLE 150 MG PO TABS: 150.0000 mg | ORAL_TABLET | ORAL | 0 refills | Status: DC | PRN

## 2023-11-07 NOTE — Progress Notes (Signed)

## 2023-11-11 ENCOUNTER — Inpatient Hospital Stay: Payer: Self-pay | Admitting: Neurology

## 2023-11-11 ENCOUNTER — Telehealth: Admitting: Physician Assistant

## 2023-11-11 DIAGNOSIS — T63461A Toxic effect of venom of wasps, accidental (unintentional), initial encounter: Secondary | ICD-10-CM

## 2023-11-11 DIAGNOSIS — F411 Generalized anxiety disorder: Secondary | ICD-10-CM | POA: Diagnosis not present

## 2023-11-11 MED ORDER — PREDNISONE 20 MG PO TABS: 40.0000 mg | ORAL_TABLET | Freq: Every day | ORAL | 0 refills | Status: DC

## 2023-11-11 NOTE — Progress Notes (Signed)
E-Visit for Insect Sting  Thank you for describing the insect sting for [place].  Here is how we plan to help!  Based on what you have shared with me you have A sting that we will treat with a short course of prednisone.  The 2 greatest risks from insect stings are allergic reaction, which can be fatal in some people and infection, which is more common and less serious.  Bees, wasps, yellow jackets, and hornets belong to a class of insects called Hymenoptera.  Most insect stings cause only minor discomfort.  Stings can happen anywhere on the body and can be painful.  Most stings are from honey bees or yellow jackets.  Fire ants can sting multiple times.  The sites of the stings are more likely to become infected.    Based on your information I have:, Provided a home care guide for insect stings and instructions on when to call for help., and I have sent in prednisone 40 mg by mouth daily for 5 days to the pharmacy you selected.  Please make sure that you selected a pharmacy that is open now.  What can be used to prevent Insect Stings?  Insect repellant with at least 20% DEET.  Wearing long pants and shirts with socks and shoes.  Wear dark or drab-colored clothes rather than bright colors.  Avoid using perfumes and hair sprays; these attract insects.  HOME CARE ADVICE:  1. Stinger removal: The stinger looks like a tiny black dot in the sting. Use a fingernail, credit card edge, or knife-edge to scrape it off.  Don't pull it out because it squeezes out more venom. If the stinger is below the skin surface, leave it alone.  It will be shed with normal skin healing. 2. Use cold compresses to the area of the sting for 10-20 minutes.  You [place] repeat this as needed to relieve symptoms of pain and swelling. 3.  For pain relief, take acetominophen 650 mg 4-6 hours as needed or ibuprofen 400 mg every 6-8 hours as needed or naproxen 250-500 mg every 12 hours as needed. 4.  You can also use  hydrocortisone cream 0.5% or 1% up to 4 times daily as needed for itching. 5.  If the sting becomes very itchy, take Benadryl 25-50 mg, follow directions on box. 6.  Wash the area 2-3 times daily with antibacterial soap and warm water. 7. Call your Doctor if: Fever, a severe headache, or rash occur in the next 2 weeks. Sting area begins to look infected. Redness and swelling worsens after home treatment. Your current symptoms become worse.    MAKE SURE YOU:  Understand these instructions. Will watch your condition. Will get help right away if you are not doing well or get worse.  Thank you for choosing an e-visit.  Your e-visit answers were reviewed by a board certified advanced clinical practitioner to complete your personal care plan. Depending upon the condition, your plan could have included both over the counter or prescription medications.  Please review your pharmacy choice. Make sure the pharmacy is open so you can pick up prescription now. If there is a problem, you [place] contact your provider through [employer] and have the prescription routed to another pharmacy.  Your safety is important to [place]. If you have drug allergies check your prescription carefully.   For the next 24 hours you can use MyChart to ask questions about today's visit, request a non-urgent call back, or ask for a work or  or school excuse. You will get an email in the next two days asking about your experience. I hope that your e-visit has been valuable and will speed your recovery.  

## 2023-11-11 NOTE — Progress Notes (Signed)
 Carelink Summary Report / Loop Recorder

## 2023-11-11 NOTE — Progress Notes (Signed)
 I have spent 5 minutes in review of e-visit questionnaire, review and updating patient chart, medical decision making and response to patient.   Piedad Climes, PA-C

## 2023-11-11 NOTE — Addendum Note (Signed)
 Addended by: VICCI SELLER A on: 11/11/2023 02:55 PM   Modules accepted: Orders

## 2023-11-25 DIAGNOSIS — F411 Generalized anxiety disorder: Secondary | ICD-10-CM | POA: Diagnosis not present

## 2023-11-27 ENCOUNTER — Ambulatory Visit: Admitting: Family Medicine

## 2023-12-04 ENCOUNTER — Other Ambulatory Visit: Payer: Self-pay | Admitting: Nurse Practitioner

## 2023-12-04 DIAGNOSIS — R635 Abnormal weight gain: Secondary | ICD-10-CM

## 2023-12-05 ENCOUNTER — Other Ambulatory Visit: Payer: Self-pay | Admitting: Adult Health

## 2023-12-08 ENCOUNTER — Ambulatory Visit

## 2023-12-08 ENCOUNTER — Other Ambulatory Visit: Payer: Self-pay

## 2023-12-08 DIAGNOSIS — I639 Cerebral infarction, unspecified: Secondary | ICD-10-CM | POA: Diagnosis not present

## 2023-12-08 MED ORDER — AMPHETAMINE-DEXTROAMPHET ER 20 MG PO CP24
20.0000 mg | ORAL_CAPSULE | Freq: Every day | ORAL | 0 refills | Status: DC
  Filled 2023-12-08: qty 30, 30d supply, fill #0

## 2023-12-09 DIAGNOSIS — F411 Generalized anxiety disorder: Secondary | ICD-10-CM | POA: Diagnosis not present

## 2023-12-09 LAB — CUP PACEART REMOTE DEVICE CHECK
Date Time Interrogation Session: 20250720235219
Implantable Pulse Generator Implant Date: 20250409

## 2023-12-10 ENCOUNTER — Encounter

## 2023-12-10 ENCOUNTER — Ambulatory Visit: Payer: Self-pay | Admitting: Cardiology

## 2023-12-12 ENCOUNTER — Telehealth (INDEPENDENT_AMBULATORY_CARE_PROVIDER_SITE_OTHER): Payer: Self-pay | Admitting: Physician Assistant

## 2023-12-12 NOTE — Telephone Encounter (Signed)
 Patient called in to cancel 07/31 appointment with Chyrl - she kept appointment with Audio.

## 2023-12-16 ENCOUNTER — Ambulatory Visit: Admitting: Family Medicine

## 2023-12-16 DIAGNOSIS — D329 Benign neoplasm of meninges, unspecified: Secondary | ICD-10-CM | POA: Diagnosis not present

## 2023-12-16 DIAGNOSIS — E785 Hyperlipidemia, unspecified: Secondary | ICD-10-CM

## 2023-12-16 DIAGNOSIS — I6930 Unspecified sequelae of cerebral infarction: Secondary | ICD-10-CM

## 2023-12-16 DIAGNOSIS — I639 Cerebral infarction, unspecified: Secondary | ICD-10-CM

## 2023-12-16 DIAGNOSIS — F411 Generalized anxiety disorder: Secondary | ICD-10-CM | POA: Diagnosis not present

## 2023-12-16 NOTE — Patient Instructions (Signed)
 Follow up annually.  Message with concerns.

## 2023-12-16 NOTE — Progress Notes (Signed)
 Subjective:  Patient ID: Brianna Griffin, female    DOB: 29-Jan-1986  Age: 38 y.o. MRN: [id number]  CC:   Chief Complaint  Patient presents with   Follow-up    Still having residual brain fog and dizziness at times after the stroke    HPI:  38 year old female presents for follow up.  Cryptogenic stroke in April. Work up has been extensive and has been negative. Follows with neurology and cardiology. Has loop recorder. Has seen neurosurgery regarding benign hemangioma as well. Hypercoagulable work up from Heme/Onc negative.   Overall, she is doing okay. Reports brain fog and intermittent dizziness. Recently started on Adderall  in June, which has not seem to help significantly.   Patient Active Problem List   Diagnosis Date Noted   Tachycardia 09/01/2023   Hyperlipidemia LDL goal <70 09/01/2023   Asthma 08/24/2023   Ischemic stroke (HCC) 08/23/2023   Meningioma (HCC) 08/23/2023   Gastroesophageal reflux disease without esophagitis 03/20/2023   Other bursal cyst, left ankle and foot 07/06/2022   Migraine without aura and without status migrainosus, not intractable 11/02/2018   Depression with anxiety 11/13/2015   IBS 10/30/2009    Social Hx   Social History   Socioeconomic History   Marital status: Married    Spouse name: Brailee Riede   Number of children: Not on file   Years of education: Not on file   Highest education level: Associate degree: academic program  Occupational History   Not on file  Tobacco Use   Smoking status: Never   Smokeless tobacco: Never  Vaping Use   Vaping status: Never Used  Substance and Sexual Activity   Alcohol use: Not Currently    Comment: rare   Drug use: No   Sexual activity: Yes    Birth control/protection: Injection  Other Topics Concern   Not on file  Social History Narrative   Not on file   Social Drivers of Health   Financial Resource Strain: Low Risk  (08/18/2023)   Overall Financial Resource Strain (CARDIA)     Difficulty of Paying Living Expenses: Not very hard  Food Insecurity: No Food Insecurity (08/25/2023)   Hunger Vital Sign    Worried About Running Out of Food in the Last Year: Never true    Ran Out of Food in the Last Year: Never true  Transportation Needs: No Transportation Needs (08/25/2023)   PRAPARE - Administrator, Civil Service (Medical): No    Lack of Transportation (Non-Medical): No  Physical Activity: Inactive (08/18/2023)   Exercise Vital Sign    Days of Exercise per Week: 0 days    Minutes of Exercise per Session: 0 min  Stress: Stress Concern Present (08/18/2023)   [employer] of Occupational Health - Occupational Stress Questionnaire    Feeling of Stress : Rather much  Social Connections: Socially Integrated (08/18/2023)   Social Connection and Isolation Panel    Frequency of Communication with Friends and Family: Twice a week    Frequency of Social Gatherings with Friends and Family: Once a week    Attends Religious Services: More than 4 times per year    Active Member of [employer] or Organizations: Yes    Attends Engineer, structural: More than 4 times per year    Marital Status: Married    Review of Systems Per HPI  Objective:  BP 130/79   Ht 5' 4 (1.626 m)   Wt 154 lb 12.8 oz (70.2 kg)  BMI 26.57 kg/m      12/16/2023    3:10 PM 10/29/2023   10:47 AM 09/29/2023   10:57 AM  BP/Weight  Systolic BP 130 126 116  Diastolic BP 79 70 68  Wt. (Lbs) 154.8 160.6 154  BMI 26.57 kg/m2 27.57 kg/m2 26.43 kg/m2    Physical Exam Vitals and nursing note reviewed.  Constitutional:      General: She is not in acute distress.    Appearance: Normal appearance.  HENT:     Head: Normocephalic and atraumatic.  Eyes:     General:        Right eye: No discharge.        Left eye: No discharge.     Conjunctiva/sclera: Conjunctivae normal.  Cardiovascular:     Rate and Rhythm: Normal rate and regular rhythm.  Pulmonary:     Effort: Pulmonary effort is  normal.     Breath sounds: Normal breath sounds. No wheezing, rhonchi or rales.  Neurological:     Mental Status: She is alert.  Psychiatric:        Mood and Affect: Mood normal.        Behavior: Behavior normal.     Lab Results  Component Value Date   WBC 6.9 08/23/2023   HGB 13.4 08/23/2023   HCT 40.3 08/23/2023   PLT 204 08/23/2023   GLUCOSE 96 08/23/2023   CHOL 112 09/29/2023   TRIG 109 09/29/2023   HDL 46 09/29/2023   LDLCALC 46 09/29/2023   ALT 13 10/12/2021   AST 12 10/12/2021   NA 136 08/23/2023   K 4.0 08/23/2023   CL 103 08/23/2023   CREATININE 0.75 08/23/2023   BUN 9 08/23/2023   CO2 24 08/23/2023   TSH 3.081 08/25/2023   INR 1.1 08/25/2023   HGBA1C 4.5 (L) 08/24/2023     Assessment & Plan:  Hyperlipidemia LDL goal <70 Assessment & Plan: Lipids at goal. Continue statin.  Orders: -     Atorvastatin  Calcium ; Take 1 tablet (40 mg total) by mouth daily.  Dispense: 90 tablet; Refill: 3  Ischemic stroke [facility]) Assessment & Plan: Cryptogenic. Cardiology following loop recorder.  Continue follow up with Neurology as directed.   Meningioma [facility]) Assessment & Plan: Benign. Has seen neurosurgery.     Follow-up:  Annually or sooner if needed.  Jacqulyn Ahle DO [facility]

## 2023-12-16 NOTE — Progress Notes (Incomplete)
 Subjective:  Patient ID: Brianna Griffin, female    DOB: [birth year]  Age: 38 y.o. MRN: [id number]  CC:   Chief Complaint  Patient presents with  . Follow-up    Still having residual brain fog and dizziness at times after the stroke    HPI:  38 year old female presents for follow up.  Cryptogenic stroke in April. Work up has been extensive and has been negative. Follows with neurology and cardiology. Has loop recorder. Has seen neurosurgery regarding benign hemangioma as well. Hypercoagulable work up from Heme/Onc negative.   Overall, she is doing okay. Reports brain fog and intermittent dizziness. Recently started on Adderall  in June, which has not seem to help significantly.   Patient Active Problem List   Diagnosis Date Noted  . Tachycardia 09/01/2023  . Hyperlipidemia LDL goal <70 09/01/2023  . Asthma 08/24/2023  . Ischemic stroke (HCC) 08/23/2023  . Meningioma (HCC) 08/23/2023  . Gastroesophageal reflux disease without esophagitis 03/20/2023  . Other bursal cyst, left ankle and foot 07/06/2022  . Migraine without aura and without status migrainosus, not intractable 11/02/2018  . Depression with anxiety 11/13/2015  . IBS 10/30/2009    Social Hx   Social History   Socioeconomic History  . Marital status: Married    Spouse name: Lorianne Malbrough  . Number of children: Not on file  . Years of education: Not on file  . Highest education level: Associate degree: academic program  Occupational History  . Not on file  Tobacco Use  . Smoking status: Never  . Smokeless tobacco: Never  Vaping Use  . Vaping status: Never Used  Substance and Sexual Activity  . Alcohol use: Not Currently    Comment: rare  . Drug use: No  . Sexual activity: Yes    Birth control/protection: Injection  Other Topics Concern  . Not on file  Social History Narrative  . Not on file   Social Drivers of Health   Financial Resource Strain: Low Risk  (08/18/2023)   Overall Financial Resource  Strain (CARDIA)   . Difficulty of Paying Living Expenses: Not very hard  Food Insecurity: No Food Insecurity (08/25/2023)   Hunger Vital Sign   . Worried About Programme researcher, broadcasting/film/video in the Last Year: Never true   . Ran Out of Food in the Last Year: Never true  Transportation Needs: No Transportation Needs (08/25/2023)   PRAPARE - Transportation   . Lack of Transportation (Medical): No   . Lack of Transportation (Non-Medical): No  Physical Activity: Inactive (08/18/2023)   Exercise Vital Sign   . Days of Exercise per Week: 0 days   . Minutes of Exercise per Session: 0 min  Stress: Stress Concern Present (08/18/2023)   [employer] of Occupational Health - Occupational Stress Questionnaire   . Feeling of Stress : Rather much  Social Connections: Socially Integrated (08/18/2023)   Social Connection and Isolation Panel   . Frequency of Communication with Friends and Family: Twice a week   . Frequency of Social Gatherings with Friends and Family: Once a week   . Attends Religious Services: More than 4 times per year   . Active Member of Clubs or Organizations: Yes   . Attends Banker Meetings: More than 4 times per year   . Marital Status: Married    Review of Systems Per HPI  Objective:  BP 130/79   Ht 5' 4 (1.626 m)   Wt 154 lb 12.8 oz (70.2 kg)  BMI 26.57 kg/m      12/16/2023    3:10 PM 10/29/2023   10:47 AM 09/29/2023   10:57 AM  BP/Weight  Systolic BP 130 126 116  Diastolic BP 79 70 68  Wt. (Lbs) 154.8 160.6 154  BMI 26.57 kg/m2 27.57 kg/m2 26.43 kg/m2    Physical Exam Vitals and nursing note reviewed.  Constitutional:      General: She is not in acute distress.    Appearance: Normal appearance.  HENT:     Head: Normocephalic and atraumatic.  Eyes:     General:        Right eye: No discharge.        Left eye: No discharge.     Conjunctiva/sclera: Conjunctivae normal.  Cardiovascular:     Rate and Rhythm: Normal rate and regular rhythm.   Pulmonary:     Effort: Pulmonary effort is normal.     Breath sounds: Normal breath sounds. No wheezing, rhonchi or rales.  Neurological:     Mental Status: She is alert.  Psychiatric:        Mood and Affect: Mood normal.        Behavior: Behavior normal.     Lab Results  Component Value Date   WBC 6.9 08/23/2023   HGB 13.4 08/23/2023   HCT 40.3 08/23/2023   PLT 204 08/23/2023   GLUCOSE 96 08/23/2023   CHOL 112 09/29/2023   TRIG 109 09/29/2023   HDL 46 09/29/2023   LDLCALC 46 09/29/2023   ALT 13 10/12/2021   AST 12 10/12/2021   NA 136 08/23/2023   K 4.0 08/23/2023   CL 103 08/23/2023   CREATININE 0.75 08/23/2023   BUN 9 08/23/2023   CO2 24 08/23/2023   TSH 3.081 08/25/2023   INR 1.1 08/25/2023   HGBA1C 4.5 (L) 08/24/2023     Assessment & Plan:  There are no diagnoses linked to this encounter.  Follow-up:  No follow-ups on file.  Jacqulyn Ahle DO [facility]

## 2023-12-17 ENCOUNTER — Other Ambulatory Visit (HOSPITAL_COMMUNITY): Payer: Self-pay

## 2023-12-17 ENCOUNTER — Other Ambulatory Visit: Payer: Self-pay

## 2023-12-17 MED ORDER — ATORVASTATIN CALCIUM 40 MG PO TABS
40.0000 mg | ORAL_TABLET | Freq: Every day | ORAL | 3 refills | Status: AC
  Filled 2023-12-17: qty 90, 90d supply, fill #0
  Filled 2024-03-24: qty 90, 90d supply, fill #1
  Filled 2024-06-21: qty 90, 90d supply, fill #2

## 2023-12-17 NOTE — Assessment & Plan Note (Signed)
 Cryptogenic. Cardiology following loop recorder.  Continue follow up with Neurology as directed.

## 2023-12-17 NOTE — Assessment & Plan Note (Addendum)
Lipids at goal. Continue statin.  

## 2023-12-17 NOTE — Assessment & Plan Note (Signed)
 Benign. Has seen neurosurgery.

## 2023-12-18 ENCOUNTER — Ambulatory Visit (INDEPENDENT_AMBULATORY_CARE_PROVIDER_SITE_OTHER): Admitting: Physician Assistant

## 2023-12-18 ENCOUNTER — Ambulatory Visit (INDEPENDENT_AMBULATORY_CARE_PROVIDER_SITE_OTHER): Admitting: Audiology

## 2023-12-22 ENCOUNTER — Other Ambulatory Visit (HOSPITAL_BASED_OUTPATIENT_CLINIC_OR_DEPARTMENT_OTHER): Payer: Self-pay

## 2023-12-22 ENCOUNTER — Other Ambulatory Visit (HOSPITAL_COMMUNITY): Payer: Self-pay

## 2023-12-22 ENCOUNTER — Ambulatory Visit: Admitting: Internal Medicine

## 2023-12-29 DIAGNOSIS — H5213 Myopia, bilateral: Secondary | ICD-10-CM | POA: Diagnosis not present

## 2023-12-30 ENCOUNTER — Encounter: Payer: Self-pay | Admitting: Women's Health

## 2023-12-30 ENCOUNTER — Ambulatory Visit: Admitting: Women's Health

## 2023-12-30 VITALS — BP 137/98 | HR 91 | Ht 64.0 in | Wt 149.4 lb

## 2023-12-30 DIAGNOSIS — Z01419 Encounter for gynecological examination (general) (routine) without abnormal findings: Secondary | ICD-10-CM

## 2023-12-30 DIAGNOSIS — R5383 Other fatigue: Secondary | ICD-10-CM | POA: Diagnosis not present

## 2023-12-30 NOTE — Progress Notes (Addendum)
 WELL-WOMAN EXAMINATION Patient name: Brianna Griffin MRN [id number]  Date of birth: 1986/01/26 Chief Complaint:   Gynecologic Exam  History of Present Illness:   Brianna Griffin is a 38 y.o. [phone] Caucasian female being seen today for a routine well-woman exam.  Current complaints: fatigue, requests labs Had sharp pain RLQ during sex 8/1, crampy on and off since, but has now stopped  PCP: [facility]      does desire labs Patient's last menstrual period was 12/10/2023. The current method of family planning is Phexxi .  Last pap 09/04/21. Results were: NILM w/ HRHPV negative. H/O abnormal pap: no Last mammogram: never. Results were: N/A. Family h/o breast cancer: no Last colonoscopy: never. Results were: N/A. Family h/o colorectal cancer: no     12/30/2023    1:39 PM 12/16/2023    3:12 PM 08/28/2023   10:08 AM 08/18/2023   11:16 AM 03/20/2023    4:11 PM  Depression screen PHQ 2/9  Decreased Interest 0 0 1 0 0  Down, Depressed, Hopeless 1 2 2 2 1   PHQ - 2 Score 1 2 3 2 1   Altered sleeping 1 0 2 1 1   Tired, decreased energy 3 2 2 1 1   Change in appetite 1 0 1 2 0  Feeling bad or failure about yourself  2 2 2 1 1   Trouble concentrating 0 0 2 1 0  Moving slowly or fidgety/restless 0 0 0 0 0  Suicidal thoughts 0 0 0 0 0  PHQ-9 Score 8 6 12 8 4   Difficult doing work/chores  Very difficult Somewhat difficult Somewhat difficult Somewhat difficult        12/30/2023    1:40 PM 12/16/2023    3:13 PM 08/28/2023   10:08 AM 08/18/2023   11:17 AM  GAD 7 : Generalized Anxiety Score  Nervous, Anxious, on Edge 1 1 2 1   Control/stop worrying 1 1 3 1   Worry too much - different things 1 1 2 1   Trouble relaxing 0 0 2 1  Restless 1 0 2 2  Easily annoyed or irritable 2 2 2 3   Afraid - awful might happen 0 0 3 0  Total GAD 7 Score 6 5 16 9   Anxiety Difficulty  Very difficult Very difficult Somewhat difficult     Review of Systems:   Pertinent items are noted in HPI Denies any headaches,  blurred vision, fatigue, shortness of breath, chest pain, abdominal pain, abnormal vaginal discharge/itching/odor/irritation, problems with periods, bowel movements, urination, or intercourse unless otherwise stated above. Pertinent History Reviewed:  Reviewed past medical,surgical, social and family history.  Reviewed problem list, medications and allergies. Physical Assessment:   Vitals:   12/30/23 1335  BP: (!) 137/98  Pulse: 91  Weight: 149 lb 6.4 oz (67.8 kg)  Height: 5' 4 (1.626 m)  Body mass index is 25.64 kg/m.        Physical Examination:   General appearance - well appearing, and in no distress  Mental status - alert, oriented to person, place, and time  Psych:  She has a normal mood and affect  Skin - warm and dry, normal color, no suspicious lesions noted  Chest - effort normal, all lung fields clear to auscultation bilaterally  Heart - normal rate and regular rhythm  Neck:  midline trachea, no thyromegaly or nodules  Breasts - breasts appear normal, no suspicious masses, no skin or nipple changes or  axillary nodes  Abdomen - soft, nontender, nondistended, no masses or organomegaly  Pelvic - VULVA: normal appearing vulva with no masses, tenderness or lesions  VAGINA: normal appearing vagina with normal color and discharge, no lesions  CERVIX: normal appearing cervix without discharge or lesions, no CMT  Thin prep pap is not done   UTERUS: uterus is felt to be normal size, shape, consistency and nontender   ADNEXA: No adnexal masses or tenderness noted.  Extremities:  No swelling or varicosities noted  Chaperone: Peggy Dones  No results found for this or any previous visit (from the past 24 hours).  Assessment & Plan:  1) Well-Woman Exam  2) Fatigue> requests labs  3) Sharp RLQ pain during sex> cramped until just recently, now stopped. Let me know if happens again, will get u/s  Labs/procedures today: as below  Mammogram: @ 38yo, or sooner if  problems Colonoscopy: @ 38yo, or sooner if problems  Orders Placed This Encounter  Procedures   CBC   TSH   Iron, TIBC and Ferritin Panel   VITAMIN D  25 Hydroxy (Vit-D Deficiency, Fractures)    Meds: No orders of the defined types were placed in this encounter.   Follow-up: Return in about 1 year (around 12/29/2024) for Pap & physical.  Suzen JONELLE Fetters CNM, [facility] 12/30/2023 3:57 PM

## 2023-12-30 NOTE — Addendum Note (Signed)
 Addended by: KIZZIE SUZEN SAUNDERS on: 12/30/2023 03:57 PM   Modules accepted: Orders

## 2023-12-31 ENCOUNTER — Other Ambulatory Visit: Payer: Self-pay | Admitting: Advanced Practice Midwife

## 2023-12-31 ENCOUNTER — Other Ambulatory Visit (HOSPITAL_COMMUNITY): Payer: Self-pay

## 2023-12-31 ENCOUNTER — Ambulatory Visit: Payer: Self-pay | Admitting: Women's Health

## 2023-12-31 LAB — CBC
Hematocrit: 38.2 % (ref 34.0–46.6)
Hemoglobin: 12.8 g/dL (ref 11.1–15.9)
MCH: 30.3 pg (ref 26.6–33.0)
MCHC: 33.5 g/dL (ref 31.5–35.7)
MCV: 90 fL (ref 79–97)
Platelets: 167 x10E3/uL (ref 150–450)
RBC: 4.23 x10E6/uL (ref 3.77–5.28)
RDW: 13.1 % (ref 11.7–15.4)
WBC: 5.6 x10E3/uL (ref 3.4–10.8)

## 2023-12-31 LAB — TSH: TSH: 1.89 u[IU]/mL (ref 0.450–4.500)

## 2023-12-31 LAB — IRON,TIBC AND FERRITIN PANEL
Ferritin: 64 ng/mL (ref 15–150)
Iron Saturation: 20 % (ref 15–55)
Iron: 66 ug/dL (ref 27–159)
Total Iron Binding Capacity: 336 ug/dL (ref 250–450)
UIBC: 270 ug/dL (ref 131–425)

## 2023-12-31 LAB — SPECIMEN STATUS REPORT

## 2024-01-01 ENCOUNTER — Ambulatory Visit (INDEPENDENT_AMBULATORY_CARE_PROVIDER_SITE_OTHER): Admitting: Audiology

## 2024-01-02 LAB — VITAMIN D 25 HYDROXY (VIT D DEFICIENCY, FRACTURES): Vit D, 25-Hydroxy: 37 ng/mL (ref 30.0–100.0)

## 2024-01-02 LAB — SPECIMEN STATUS REPORT

## 2024-01-08 ENCOUNTER — Other Ambulatory Visit (HOSPITAL_COMMUNITY): Payer: Self-pay

## 2024-01-08 ENCOUNTER — Other Ambulatory Visit: Payer: Self-pay

## 2024-01-08 ENCOUNTER — Ambulatory Visit (INDEPENDENT_AMBULATORY_CARE_PROVIDER_SITE_OTHER)

## 2024-01-08 DIAGNOSIS — I639 Cerebral infarction, unspecified: Secondary | ICD-10-CM

## 2024-01-08 LAB — CUP PACEART REMOTE DEVICE CHECK
Date Time Interrogation Session: 20250821003553
Implantable Pulse Generator Implant Date: 20250409

## 2024-01-08 MED ORDER — ONDANSETRON 4 MG PO TBDP
4.0000 mg | ORAL_TABLET | Freq: Four times a day (QID) | ORAL | 2 refills | Status: AC | PRN
  Filled 2024-01-08: qty 30, 8d supply, fill #0

## 2024-01-13 DIAGNOSIS — F411 Generalized anxiety disorder: Secondary | ICD-10-CM | POA: Diagnosis not present

## 2024-01-14 ENCOUNTER — Encounter

## 2024-01-14 ENCOUNTER — Encounter: Attending: Family Medicine | Admitting: Nutrition

## 2024-01-14 ENCOUNTER — Encounter: Payer: Self-pay | Admitting: Nutrition

## 2024-01-14 DIAGNOSIS — R635 Abnormal weight gain: Secondary | ICD-10-CM | POA: Insufficient documentation

## 2024-01-14 DIAGNOSIS — F341 Dysthymic disorder: Secondary | ICD-10-CM | POA: Diagnosis not present

## 2024-01-14 DIAGNOSIS — I639 Cerebral infarction, unspecified: Secondary | ICD-10-CM | POA: Insufficient documentation

## 2024-01-14 DIAGNOSIS — F900 Attention-deficit hyperactivity disorder, predominantly inattentive type: Secondary | ICD-10-CM | POA: Diagnosis not present

## 2024-01-14 DIAGNOSIS — E785 Hyperlipidemia, unspecified: Secondary | ICD-10-CM | POA: Insufficient documentation

## 2024-01-14 NOTE — Patient Instructions (Signed)
 Goals  Drink 2 bottles of water per day Eat 1 fruit and 1 vegetable per day Work on meal planning and meal prepping weekly.

## 2024-01-14 NOTE — Addendum Note (Signed)
 Addended by: Olaf Mesa A on: 01/14/2024 02:07 PM   Modules accepted: Orders

## 2024-01-14 NOTE — Progress Notes (Signed)
 Medical Nutrition Therapy    [employer] Employee  Appointment Start time:  [phone]  Appointment End time:  1015  Primary concerns today: Overall poor nutrition  Referral diagnosis: R63.5 Preferred learning style: No Preference  Learning readiness: Ready    NUTRITION ASSESSMENT  38 yr old wfemale referred for overall poor eating habits and weight gain.. H/o a stroke. I have always eaten a lot of processed food growing up. It's beginning to effect my health.I am ready to make some changes. PMH: ADHD, anxiety, depression, GERD, IBS, Hypothyroid, HLD  Denies problems with chewing, swallowing or ambulation. Diet is high in processed food with lots of sugars, fats and salts. Eats convenience foods, fast food and junk food on regular basis. Feels overwhelmed at times. Doesn't cook a lot. Has anxiety. Drinks 4-5 regular sodas per day. Doesn't drink much water at all. Works from home. Had a stroke from unknown causes. Is wanting to eat healthier to prevent further health issues. Clinical  Wt Readings from Last 3 Encounters:  12/30/23 149 lb 6.4 oz (67.8 kg)  12/16/23 154 lb 12.8 oz (70.2 kg)  10/29/23 160 lb 9.6 oz (72.8 kg)   Ht Readings from Last 3 Encounters:  12/30/23 5' 4 (1.626 m)  12/16/23 5' 4 (1.626 m)  10/29/23 5' 4 (1.626 m)   There is no height or weight on file to calculate BMI. @BMIFA @ Facility age limit for growth %iles is 20 years. Facility age limit for growth %iles is 20 years.  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 6.7 g 0   aspirin  EC 81 MG tablet Take 1 tablet (81 mg total) by mouth daily with breakfast. Swallow whole. 90 tablet 3   atorvastatin  (LIPITOR) 40 MG tablet Take 1 tablet (40 mg total) by mouth daily. 90 tablet 3   Calcium  Carbonate Antacid (TUMS PO) Take 1 tablet by mouth daily as needed (heartburn).     famotidine  (PEPCID ) 20 MG tablet Take 1 tablet (20 mg total) by mouth 2 (two) times daily as needed for acid reflux 180 tablet 0   ondansetron  (ZOFRAN -ODT) 4 MG disintegrating tablet Dissolve 1 tablet (4 mg total) by mouth every 8 (eight) hours as needed for nausea. 30 tablet 0   ondansetron  (ZOFRAN -ODT) 4 MG disintegrating tablet Take 1 tablet (4 mg total) by mouth every 6 (six) hours as needed for nausea. 30 tablet 2   PHEXXI  1.8-1-0.4 % GEL Place 1 Application vaginally daily as needed (contraceptive). Spermicidal     sertraline  (ZOLOFT ) 100 MG tablet Take 1 tablet (100 mg total) by mouth daily. 90 tablet 3   acetaminophen  (TYLENOL ) 500 MG tablet Take 1,000 mg by mouth every 6 (six) hours as needed for mild pain (pain score 1-3).     No current facility-administered medications  on file prior to visit.    Labs:     Latest Ref Rng & Units 08/23/2023   10:51 AM 10/12/2021    8:10 AM 12/06/2020    2:29 AM  CMP  Glucose 70 - 99 mg/dL 96  85  898   BUN 6 - 20 mg/dL 9  11  7    Creatinine 0.44 - 1.00 mg/dL 9.24  8.95  9.35   Sodium 135 - 145 mmol/L 136  139  134   Potassium 3.5 - 5.1 mmol/L 4.0  4.4  3.4   Chloride 98 - 111 mmol/L 103  102  103   CO2 22 - 32 mmol/L 24  22  20    Calcium  8.9 - 10.3 mg/dL 9.4  9.4  9.3   Total Protein 6.0 - 8.5 g/dL  7.2  6.8   Total Bilirubin 0.0 - 1.2 mg/dL  0.6  0.8   Alkaline Phos 44 - 121 IU/L  111  242   AST 0 - 40 IU/L  12  18   ALT 0 - 32 IU/L  13  17    Lipid Panel      Component Value Date/Time   CHOL 112 09/29/2023 1127   TRIG 109 09/29/2023 1127   HDL 46 09/29/2023 1127   CHOLHDL 2.4 09/29/2023 1127   CHOLHDL 3.6 08/24/2023 0833   VLDL 13 08/24/2023 0833   LDLCALC 46 09/29/2023 1127   LABVLDL 20 09/29/2023 1127   Lab Results  Component Value Date   HGBA1C 4.5 (L) 08/24/2023    Notable Signs/Symptoms: lack of energy and has anxiety  Lifestyle & Dietary Hx LIves with her husband and 3 kids. Works remotely from home FT  Estimated daily fluid intake: Not much water intake but drinks sundrop--36-48 oz regular. Supplements: none Sleep: 6-7 hrs Stress / self-care: some family stress Current average weekly physical activity: None  24-Hr Dietary Recall First Meal: 8 am usually  grab and go stuff;-mini muffins, cereal bars, cinnamon pastry, soda Snack:  Second Meal: PB and jelly sandwich on white bread or frozen dinner-lean  cuisine or banquet pot pie-- soda   may sometimes skips lunch Snack:  Third Meal: Spaghetti 3/4 cup and garlic bread, soda Snack:  Beverages: soda,  Estimated Energy Needs Calories: 1500-1800 Carbohydrate: 170g Protein: 112g Fat: 42g   NUTRITION DIAGNOSIS  NI-5.6.2 Excessive fat intake As related to High processed diet of fat, salt and simple sugars.  As evidenced by diet recall, HLD, h/o stroke.SABRA   NUTRITION INTERVENTION  Nutrition education (E-1) on the following topics:  Lifestyle Medicine  - Whole Food, Plant Predominant Nutrition is highly recommended: Eat Plenty of vegetables, Mushrooms, fruits, Legumes, Whole Grains, Nuts, seeds in lieu of processed meats, processed snacks/pastries red meat, poultry, eggs.    -It is better to avoid simple carbohydrates including: Cakes, Sweet Desserts, Ice Cream, Soda (diet and regular), Sweet Tea, Candies, Chips, Cookies, Store Bought Juices, Alcohol in Excess of  1-2 drinks a day, Lemonade,  Artificial Sweeteners, Doughnuts, Coffee Creamers, Sugar-free Products, etc,  etc.  This is not a complete list.....  Exercise: If you are able: 30 -60 minutes a day ,4 days a week, or 150 minutes a week.  The longer the better.  Combine stretch, strength, and aerobic activities.  If you were told in the past that you have high risk for cardiovascular diseases, you may seek evaluation by your heart doctor prior to initiating moderate to intense exercise programs.   Handouts Provided Include  Lifestyle Medicine handouts My Plate  Learning Style & Readiness for Change Teaching method utilized: Visual & Auditory  Demonstrated degree of understanding via: Teach Back  Barriers to learning/adherence to lifestyle change: None  Goals Established by Pt Goals  Drink 2 bottles of water per day Eat 1 fruit and 1 vegetable per day Work on meal planning and meal prepping weekly.   MONITORING & EVALUATION Dietary intake, weekly physical activity, in 1 month.  Next Steps  Patient is to work on eating better balanced meals and cutting down on sodas and processed foods.SABRA

## 2024-01-14 NOTE — Progress Notes (Signed)
 Carelink Summary Report / Loop Recorder

## 2024-01-16 ENCOUNTER — Other Ambulatory Visit: Payer: Self-pay | Admitting: Adult Health

## 2024-01-16 DIAGNOSIS — F900 Attention-deficit hyperactivity disorder, predominantly inattentive type: Secondary | ICD-10-CM

## 2024-01-16 DIAGNOSIS — F902 Attention-deficit hyperactivity disorder, combined type: Secondary | ICD-10-CM

## 2024-01-18 ENCOUNTER — Ambulatory Visit: Payer: Self-pay | Admitting: Cardiology

## 2024-01-20 ENCOUNTER — Other Ambulatory Visit: Payer: Self-pay | Admitting: Medical Genetics

## 2024-01-20 DIAGNOSIS — Z006 Encounter for examination for normal comparison and control in clinical research program: Secondary | ICD-10-CM

## 2024-01-20 MED ORDER — AMPHETAMINE-DEXTROAMPHET ER 20 MG PO CP24
20.0000 mg | ORAL_CAPSULE | Freq: Every day | ORAL | 0 refills | Status: DC
  Filled 2024-01-20: qty 30, 30d supply, fill #0

## 2024-01-20 NOTE — Progress Notes (Signed)
 gene

## 2024-01-21 ENCOUNTER — Other Ambulatory Visit: Payer: Self-pay

## 2024-01-21 ENCOUNTER — Other Ambulatory Visit (HOSPITAL_COMMUNITY): Payer: Self-pay

## 2024-01-21 ENCOUNTER — Encounter: Payer: Self-pay | Admitting: Adult Health

## 2024-01-21 ENCOUNTER — Telehealth: Admitting: Adult Health

## 2024-01-21 DIAGNOSIS — F902 Attention-deficit hyperactivity disorder, combined type: Secondary | ICD-10-CM

## 2024-01-21 DIAGNOSIS — F331 Major depressive disorder, recurrent, moderate: Secondary | ICD-10-CM | POA: Diagnosis not present

## 2024-01-21 DIAGNOSIS — F411 Generalized anxiety disorder: Secondary | ICD-10-CM | POA: Diagnosis not present

## 2024-01-21 MED ORDER — METHYLPHENIDATE HCL ER (OSM) 27 MG PO TBCR
27.0000 mg | EXTENDED_RELEASE_TABLET | ORAL | 0 refills | Status: DC
  Filled 2024-01-21: qty 30, 30d supply, fill #0

## 2024-01-21 NOTE — Progress Notes (Signed)
 Brianna Griffin [id number] Feb 28, 1986 38 y.o.  Virtual Visit via Video Note  I connected with pt @ on 01/21/24 at 11:00 AM EDT by a video enabled telemedicine application and verified that I am speaking with the correct person using two identifiers.   I discussed the limitations of evaluation and management by telemedicine and the availability of in person appointments. The patient expressed understanding and agreed to proceed.  I discussed the assessment and treatment plan with the patient. The patient was provided an opportunity to ask questions and all were answered. The patient agreed with the plan and demonstrated an understanding of the instructions.   The patient was advised to call back or seek an in-person evaluation if the symptoms worsen or if the condition fails to improve as anticipated.  I provided 15 minutes of non-face-to-face time during this encounter.  The patient was located at home.  The provider was located at [facility].   Angeline LOISE Sayers, NP   Subjective:   Patient ID:  Brianna Griffin is a 38 y.o. (DOB 1986-03-20) female.  Chief Complaint: No chief complaint on file.   HPI Alondria Mousseau presents for follow-up of MDD and GAD.  Describes mood today as varies. Pleasant. Reports some tearfulness. Mood symptoms - reports depression, anxiety, and irritability - at times.  Reports improved interest and motivation. Denies recent panic attacks. Reports some worry, rumination and over thinking. Denies obsessive thoughts or acts - not as much of an issue. Reports some issues with anger and rage. Reports mood is some better. Stating I feel like I've seen an improvement. Feels like current medications are helpful, but would like to consider other treatment options for ADD symptoms. Taking medications as prescribed.  Energy levels improved. Active, does not have a regular exercise routine. Enjoys some usual interests and activities. Married. Lives with  husband and 3 children. Spending time with family. Appetite adequate. Weight loss - 148 from 150 pounds Sleeps well most nights. Averages 6 to 7 hours. Focus and concentration difficulties - they have improved some. Completing tasks. Managing aspects of household. Works F/T for [employer]. Denies SI or HI.  Denies AH or VH. Denies self harm. Denies substance use.   Seeing therapist - Asberry Hoffman  Review of Systems:  Review of Systems  Musculoskeletal:  Negative for gait problem.  Neurological:  Negative for tremors.  Psychiatric/Behavioral:         Please refer to HPI    Medications: I have reviewed the patient's current medications.  Current Outpatient Medications  Medication Sig Dispense Refill   methylphenidate  (CONCERTA ) 27 MG PO CR tablet Take 1 tablet (27 mg total) by mouth every morning. 30 tablet 0   acetaminophen  (TYLENOL ) 500 MG tablet Take 1,000 mg by mouth every 6 (six) hours as needed for mild pain (pain score 1-3).     albuterol  (VENTOLIN  HFA) 108 (90 Base) MCG/ACT inhaler Inhale 2 puffs into the lungs every 6 (six) hours as needed for wheezing or shortness of breath. 6.7 g 0   amphetamine -dextroamphetamine  (ADDERALL  XR) 20 MG 24 hr capsule Take 1 capsule (20 mg total) by mouth daily. 30 capsule 0   aspirin  EC 81 MG tablet Take 1 tablet (81 mg total) by mouth daily with breakfast. Swallow whole. 90 tablet 3   atorvastatin  (LIPITOR) 40 MG tablet Take 1 tablet (40 mg total) by mouth daily. 90 tablet 3   Calcium  Carbonate Antacid (TUMS PO) Take 1 tablet by mouth daily as needed (heartburn).  famotidine  (PEPCID ) 20 MG tablet Take 1 tablet (20 mg total) by mouth 2 (two) times daily as needed for acid reflux 180 tablet 0   ondansetron  (ZOFRAN -ODT) 4 MG disintegrating tablet Dissolve 1 tablet (4 mg total) by mouth every 8 (eight) hours as needed for nausea. 30 tablet 0   ondansetron  (ZOFRAN -ODT) 4 MG disintegrating tablet Take 1 tablet (4 mg total) by mouth every 6  (six) hours as needed for nausea. 30 tablet 2   PHEXXI  1.8-1-0.4 % GEL Place 1 Application vaginally daily as needed (contraceptive). Spermicidal     sertraline  (ZOLOFT ) 100 MG tablet Take 1 tablet (100 mg total) by mouth daily. 90 tablet 3   No current facility-administered medications for this visit.    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   IBS (irritable bowel syndrome)    Migraine headache    Migraines    Miscarriage 09/09/2013   Thyroid  disease    hypothyroid   Vaginal Pap smear, abnormal     Family History  Problem Relation Age of Onset   Diabetes Maternal Grandmother    Hypertension Maternal Grandmother    Cancer Maternal Grandfather        bladder and liver   Crohn's disease Maternal Grandfather    Diabetes Father    Hypertension Father    Post-traumatic stress disorder Father    ADD / ADHD Brother    Depression Brother    ADD / ADHD Sister    Depression Sister    ADD / ADHD Daughter    Epilepsy Daughter    Epilepsy Son    ADD / ADHD Son    Autism Son     Social History   Socioeconomic History   Marital status: Married    Spouse name: Erisha Paugh   Number of children: Not on file   Years of education: Not on file   Highest education level: Associate degree: academic program  Occupational History   Not on file  Tobacco Use   Smoking status: Never   Smokeless tobacco: Never  Vaping Use   Vaping status: Never Used  Substance and Sexual Activity   Alcohol use: Not Currently    Comment: rare   Drug use: No   Sexual activity: Yes    Birth control/protection: Inserts  Other Topics Concern   Not on file  Social History Narrative   Not on file   Social  Drivers of Health   Financial Resource Strain: Low Risk  (08/18/2023)   Overall Financial Resource Strain (CARDIA)    Difficulty of Paying Living Expenses: Not very hard  Food Insecurity: No Food Insecurity (08/25/2023)   Hunger Vital Sign    Worried About Running Out of Food in the Last Year: Never true    Ran Out of Food in the Last Year: Never true  Transportation Needs: No Transportation Needs (08/25/2023)   PRAPARE - Administrator, Civil Service (Medical): No    Lack of Transportation (Non-Medical): No  Physical Activity: Inactive (08/18/2023)   Exercise Vital Sign    Days of Exercise per Week: 0 days    Minutes of Exercise per Session: 0 min  Stress: Stress Concern Present (08/18/2023)   [employer] of Occupational Health - Occupational Stress Questionnaire    Feeling of Stress : Rather much  Social Connections: Socially Integrated (08/18/2023)   Social Connection and Isolation Panel    Frequency of Communication with Friends and Family: Twice a week    Frequency of Social Gatherings with Friends and Family: Once a week    Attends Religious Services: More than 4 times per year    Active Member of [employer] or Organizations: Yes    Attends Engineer, structural: More than 4 times per year    Marital Status: Married  Catering manager Violence: Not At Risk (08/25/2023)   Humiliation, Afraid, Rape, and Kick questionnaire    Fear of Current or Ex-Partner: No    Emotionally Abused: No    Physically Abused: No    Sexually Abused: No    Past Medical History, Surgical history, Social history, and Family history were reviewed and updated as appropriate.   Please see review of systems for further details on the patient's review from today.   Objective:   Physical Exam:  LMP 12/10/2023   Physical Exam Constitutional:      General: She is not in acute distress. Musculoskeletal:        General: No deformity.  Neurological:     Mental Status: She is alert and  oriented to person, place, and time.     Coordination: Coordination normal.  Psychiatric:        Attention and Perception: Attention and perception normal. She does not perceive auditory or visual hallucinations.        Mood and Affect: Mood normal. Mood is not anxious or depressed. Affect is not labile, blunt, angry or inappropriate.        Speech: Speech normal.        Behavior: Behavior normal.        Thought Content: Thought content normal. Thought content is not paranoid or delusional. Thought content does not include homicidal or suicidal ideation. Thought content does not include homicidal or suicidal plan.        Cognition and Memory: Cognition and memory normal.        Judgment: Judgment normal.     Comments: Insight intact     Lab Review:     Component Value Date/Time   NA 136 08/23/2023 1051   NA 139 10/12/2021 0810   K 4.0 08/23/2023 1051   CL 103 08/23/2023 1051   CO2 24 08/23/2023 1051   GLUCOSE 96 08/23/2023 1051   BUN 9 08/23/2023 1051   BUN 11 10/12/2021 0810   CREATININE 0.75 08/23/2023 1051   CALCIUM  9.4 08/23/2023 1051   PROT 7.2 10/12/2021 0810   ALBUMIN 4.7 10/12/2021 0810   AST 12 10/12/2021 0810   ALT 13 10/12/2021 0810   ALKPHOS 111 10/12/2021 0810   BILITOT 0.6 10/12/2021 0810   GFRNONAA >60 08/23/2023 1051   GFRAA 138 06/14/2020 1651       Component Value Date/Time   WBC 5.6 12/30/2023 1408   WBC 6.9 08/23/2023 1051   RBC 4.23 12/30/2023 1408   RBC 4.57 08/23/2023 1051   HGB 12.8 12/30/2023 1408   HCT 38.2 12/30/2023 1408   PLT 167 12/30/2023 1408   MCV 90 12/30/2023 1408   MCH 30.3 12/30/2023 1408   MCH 29.3 08/23/2023 1051   MCHC 33.5 12/30/2023 1408   MCHC 33.3 08/23/2023 1051   RDW 13.1 12/30/2023 1408   LYMPHSABS 1.4 08/23/2023  1051   LYMPHSABS 1.9 06/14/2020 1651   MONOABS 0.3 08/23/2023 1051   EOSABS 0.1 08/23/2023 1051   EOSABS 0.1 06/14/2020 1651   BASOSABS 0.0 08/23/2023 1051   BASOSABS 0.0 06/14/2020 1651    No results  found for: POCLITH, LITHIUM   No results found for: PHENYTOIN, PHENOBARB, VALPROATE, CBMZ   .res Assessment: Plan:    Plan:  PDMP reviewed  D/C Adderall  XR 20mg  every morning Add Concerta  27mg  every morning  Continue Zoloft  100mg  daily  Hydroxyzine  25mg  up to 4 x times - using as needed  Genesight testing on file   Screening tools: [facility] ADD test 39/58 - ADD possible Connors positive for ADD inattentive type  RTC 3 months or after seen by cardiology.  15 minutes spent dedicated to the care of this patient on the date of this encounter to include pre-visit review of records, ordering of medication, post visit documentation, and face-to-face time with the patient discussing MDD and GAD. Discussed continuing current medication regimen.  Patient advised to contact office with any questions, adverse effects, or acute worsening in signs and symptoms.   Diagnoses and all orders for this visit:  Attention deficit hyperactivity disorder (ADHD), combined type -     methylphenidate  (CONCERTA ) 27 MG PO CR tablet; Take 1 tablet (27 mg total) by mouth every morning.  Major depressive disorder, recurrent episode, moderate (HCC)  Generalized anxiety disorder     Please see After Visit Summary for patient specific instructions.  Future Appointments  Date Time Provider Department Center  02/09/2024  7:15 AM [facility] DEVICE REMOTES [facility]  03/11/2024  7:15 AM [facility] DEVICE REMOTES [facility]  04/12/2024  7:15 AM [facility] DEVICE REMOTES [facility]  04/21/2024 11:30 AM Babatunde Seago, Angeline Mattocks, NP [facility] None  04/22/2024  9:15 AM Alm Delon SAILOR, DO [facility] None  07/08/2024  2:30 PM Rosemarie Eather RAMAN, MD [facility] None    No orders of the defined types were placed in this encounter.     -------------------------------

## 2024-01-22 ENCOUNTER — Encounter: Payer: Self-pay | Admitting: Family Medicine

## 2024-01-22 ENCOUNTER — Encounter: Payer: Self-pay | Admitting: Nutrition

## 2024-01-22 DIAGNOSIS — F900 Attention-deficit hyperactivity disorder, predominantly inattentive type: Secondary | ICD-10-CM | POA: Diagnosis not present

## 2024-01-22 DIAGNOSIS — F341 Dysthymic disorder: Secondary | ICD-10-CM | POA: Diagnosis not present

## 2024-01-27 DIAGNOSIS — F411 Generalized anxiety disorder: Secondary | ICD-10-CM | POA: Diagnosis not present

## 2024-01-28 ENCOUNTER — Encounter: Payer: Self-pay | Admitting: Physician Assistant

## 2024-01-28 DIAGNOSIS — F900 Attention-deficit hyperactivity disorder, predominantly inattentive type: Secondary | ICD-10-CM | POA: Diagnosis not present

## 2024-01-28 DIAGNOSIS — F341 Dysthymic disorder: Secondary | ICD-10-CM | POA: Diagnosis not present

## 2024-01-29 ENCOUNTER — Ambulatory Visit: Admitting: Nutrition

## 2024-02-03 DIAGNOSIS — F341 Dysthymic disorder: Secondary | ICD-10-CM | POA: Diagnosis not present

## 2024-02-03 DIAGNOSIS — F900 Attention-deficit hyperactivity disorder, predominantly inattentive type: Secondary | ICD-10-CM | POA: Diagnosis not present

## 2024-02-09 ENCOUNTER — Ambulatory Visit (INDEPENDENT_AMBULATORY_CARE_PROVIDER_SITE_OTHER)

## 2024-02-09 DIAGNOSIS — I639 Cerebral infarction, unspecified: Secondary | ICD-10-CM | POA: Diagnosis not present

## 2024-02-09 LAB — CUP PACEART REMOTE DEVICE CHECK
Date Time Interrogation Session: 20250921234228
Implantable Pulse Generator Implant Date: 20250409

## 2024-02-10 DIAGNOSIS — F411 Generalized anxiety disorder: Secondary | ICD-10-CM | POA: Diagnosis not present

## 2024-02-10 NOTE — Progress Notes (Signed)
 Remote Loop Recorder Transmission

## 2024-02-11 ENCOUNTER — Encounter: Attending: Family Medicine | Admitting: Nutrition

## 2024-02-11 VITALS — Ht 64.0 in | Wt 147.0 lb

## 2024-02-11 DIAGNOSIS — R635 Abnormal weight gain: Secondary | ICD-10-CM | POA: Insufficient documentation

## 2024-02-11 DIAGNOSIS — F341 Dysthymic disorder: Secondary | ICD-10-CM | POA: Diagnosis not present

## 2024-02-11 DIAGNOSIS — I639 Cerebral infarction, unspecified: Secondary | ICD-10-CM | POA: Insufficient documentation

## 2024-02-11 DIAGNOSIS — E785 Hyperlipidemia, unspecified: Secondary | ICD-10-CM | POA: Insufficient documentation

## 2024-02-11 DIAGNOSIS — F900 Attention-deficit hyperactivity disorder, predominantly inattentive type: Secondary | ICD-10-CM | POA: Diagnosis not present

## 2024-02-11 NOTE — Patient Instructions (Signed)
 Goals Keeping working on improving food choices. Drink 2 bottles of water per day-  Eat 1 fruit and 1 vegetable per day-   Work on meal planning and meal prepping weekly.- .

## 2024-02-11 NOTE — Progress Notes (Unsigned)
 Medical Nutrition Therapy    [employer] Employee  Appointment Start time:  [phone]  Appointment End time:  1015  Primary concerns today: Overall poor nutrition  Referral diagnosis: R63.5 Preferred learning style: No Preference  Learning readiness: Ready    NUTRITION ASSESSMENT  38 yr old wfemale referred for overall poor eating habits and weight gain.. H/o a stroke. I have always eaten a lot of processed food growing up. It's beginning to effect my health.I am ready to make some changes. PMH: ADHD, anxiety, depression, GERD, IBS, Hypothyroid, HLD  Denies problems with chewing, swallowing or ambulation. Diet is high in processed food with lots of sugars, fats and salts. Eats convenience foods, fast food and junk food on regular basis. Feels overwhelmed at times. Doesn't cook a lot. Has anxiety. Drinks 4-5 regular sodas per day. Doesn't drink much water at all. Works from home. Had a stroke from unknown causes. Is wanting to eat healthier to prevent further health issues. Clinical  Wt Readings from Last 3 Encounters:  12/30/23 149 lb 6.4 oz (67.8 kg)  12/16/23 154 lb 12.8 oz (70.2 kg)  10/29/23 160 lb 9.6 oz (72.8 kg)   Ht Readings from Last 3 Encounters:  12/30/23 5' 4 (1.626 m)  12/16/23 5' 4 (1.626 m)  10/29/23 5' 4 (1.626 m)   There is no height or weight on file to calculate BMI. @BMIFA @ Facility age limit for growth %iles is 20 years. Facility age limit for growth %iles is 20 years.  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     albuterol  (VENTOLIN  HFA) 108 (90 Base) MCG/ACT inhaler Inhale 2 puffs into the lungs every 6 (six) hours as needed for wheezing or shortness of breath. 6.7 g 0   amphetamine -dextroamphetamine  (ADDERALL  XR) 20 MG 24 hr capsule Take 1 capsule (20 mg total) by mouth daily. 30 capsule 0   aspirin  EC 81 MG tablet Take 1 tablet (81 mg total) by mouth daily with breakfast. Swallow whole. 90 tablet 3   atorvastatin  (LIPITOR) 40 MG tablet Take 1 tablet (40 mg total) by mouth daily. 90 tablet 3   Calcium  Carbonate Antacid (TUMS PO) Take 1 tablet by mouth daily as needed (heartburn).     famotidine  (PEPCID ) 20 MG tablet Take 1 tablet (20 mg total) by mouth 2 (two) times daily as needed for acid reflux 180 tablet 0   methylphenidate  (CONCERTA ) 27 MG PO CR tablet Take 1 tablet (27 mg total) by mouth every morning. 30 tablet 0   ondansetron  (ZOFRAN -ODT) 4 MG disintegrating tablet Dissolve 1 tablet (4 mg total) by mouth every 8 (eight) hours as needed for nausea. 30 tablet 0   ondansetron  (ZOFRAN -ODT) 4 MG disintegrating tablet Take 1 tablet (4 mg total) by mouth every 6 (six) hours as needed for nausea. 30  tablet 2   PHEXXI  1.8-1-0.4 % GEL Place 1 Application vaginally daily as needed (contraceptive). Spermicidal     sertraline  (ZOLOFT ) 100 MG tablet Take 1 tablet (100 mg total) by mouth daily. 90 tablet 3   No current facility-administered medications on file prior to visit.    Labs:     Latest Ref Rng & Units 08/23/2023   10:51 AM 10/12/2021    8:10 AM 12/06/2020    2:29 AM  CMP  Glucose 70 - 99 mg/dL 96  85  898   BUN 6 - 20 mg/dL 9  11  7    Creatinine 0.44 - 1.00 mg/dL 9.24  8.95  9.35   Sodium 135 - 145 mmol/L 136  139  134   Potassium 3.5 - 5.1 mmol/L 4.0  4.4  3.4   Chloride 98 - 111 mmol/L 103  102  103   CO2 22 - 32 mmol/L 24  22  20    Calcium  8.9  - 10.3 mg/dL 9.4  9.4  9.3   Total Protein 6.0 - 8.5 g/dL  7.2  6.8   Total Bilirubin 0.0 - 1.2 mg/dL  0.6  0.8   Alkaline Phos 44 - 121 IU/L  111  242   AST 0 - 40 IU/L  12  18   ALT 0 - 32 IU/L  13  17    Lipid Panel     Component Value Date/Time   CHOL 112 09/29/2023 1127   TRIG 109 09/29/2023 1127   HDL 46 09/29/2023 1127   CHOLHDL 2.4 09/29/2023 1127   CHOLHDL 3.6 08/24/2023 0833   VLDL 13 08/24/2023 0833   LDLCALC 46 09/29/2023 1127   LABVLDL 20 09/29/2023 1127   Lab Results  Component Value Date   HGBA1C 4.5 (L) 08/24/2023    Notable Signs/Symptoms: lack of energy and has anxiety  Lifestyle & Dietary Hx LIves with her husband and 3 kids. Works remotely from home FT  Estimated daily fluid intake: Not much water intake but drinks sundrop--36-48 oz regular. Supplements: none Sleep: 6-7 hrs Stress / self-care: some family stress Current average weekly physical activity: None  24-Hr Dietary Recall First Meal: 8 am usually  grab and go stuff;-mini muffins, cereal bars, cinnamon pastry, soda Snack:  Second Meal: PB and jelly sandwich on white bread or frozen dinner-lean  cuisine or banquet pot pie-- soda   may sometimes skips lunch Snack:  Third Meal: Spaghetti 3/4 cup and garlic bread, soda Snack:  Beverages: soda,  Estimated Energy Needs Calories: 1500-1800 Carbohydrate: 170g Protein: 112g Fat: 42g   NUTRITION DIAGNOSIS  NI-5.6.2 Excessive fat intake As related to High processed diet of fat, salt and simple sugars.  As evidenced by diet recall, HLD, h/o stroke.SABRA   NUTRITION INTERVENTION  Nutrition education (E-1) on the following topics:  Lifestyle Medicine  - Whole Food, Plant Predominant Nutrition is highly recommended: Eat Plenty of vegetables, Mushrooms, fruits, Legumes, Whole Grains, Nuts, seeds in lieu of processed meats, processed snacks/pastries red meat, poultry, eggs.    -It is better to avoid simple carbohydrates including: Cakes, Sweet  Desserts, Ice Cream, Soda (diet and regular), Sweet Tea, Candies, Chips, Cookies, Store Bought Juices, Alcohol in Excess of  1-2 drinks a day, Lemonade,  Artificial Sweeteners, Doughnuts, Coffee Creamers, Sugar-free Products, etc, etc.  This is not a complete list.....  Exercise: If you are able: 30 -60 minutes a day ,4 days a week, or 150 minutes a week.  The longer the better.  Combine stretch, strength, and aerobic activities.  If you were told in the past that you have high risk for cardiovascular diseases, you may seek evaluation by your heart doctor prior to initiating moderate to intense exercise programs.   Handouts Provided Include  Lifestyle Medicine handouts My Plate  Learning Style & Readiness for Change Teaching method utilized: Visual & Auditory  Demonstrated degree of understanding via: Teach Back  Barriers to learning/adherence to lifestyle change: None  Goals Established by Pt Goals  Drink 2 bottles of water per day- work on progress Eat 1 fruit and 1 vegetable per day- work in progress. Work on meal planning and meal prepping weekly.- has done some.   MONITORING & EVALUATION Dietary intake, weekly physical activity, in 1 month.  Next Steps  Patient is to work on eating better balanced meals and cutting down on sodas and processed foods.SABRA

## 2024-02-12 ENCOUNTER — Encounter: Payer: Self-pay | Admitting: Nutrition

## 2024-02-14 ENCOUNTER — Ambulatory Visit: Payer: Self-pay | Admitting: Cardiology

## 2024-02-18 ENCOUNTER — Encounter

## 2024-02-18 DIAGNOSIS — F900 Attention-deficit hyperactivity disorder, predominantly inattentive type: Secondary | ICD-10-CM | POA: Diagnosis not present

## 2024-02-18 DIAGNOSIS — F341 Dysthymic disorder: Secondary | ICD-10-CM | POA: Diagnosis not present

## 2024-02-23 ENCOUNTER — Other Ambulatory Visit: Payer: Self-pay

## 2024-02-23 ENCOUNTER — Other Ambulatory Visit: Payer: Self-pay | Admitting: Adult Health

## 2024-02-23 ENCOUNTER — Other Ambulatory Visit (HOSPITAL_COMMUNITY): Payer: Self-pay

## 2024-02-23 DIAGNOSIS — F902 Attention-deficit hyperactivity disorder, combined type: Secondary | ICD-10-CM

## 2024-02-23 MED ORDER — METHYLPHENIDATE HCL ER (OSM) 27 MG PO TBCR: 27.0000 mg | EXTENDED_RELEASE_TABLET | ORAL | 0 refills | Status: DC

## 2024-02-23 MED ORDER — METHYLPHENIDATE HCL ER (OSM) 27 MG PO TBCR
27.0000 mg | EXTENDED_RELEASE_TABLET | ORAL | 0 refills | Status: DC
  Filled 2024-03-24: qty 30, 30d supply, fill #0

## 2024-02-23 MED ORDER — METHYLPHENIDATE HCL ER (OSM) 27 MG PO TBCR
27.0000 mg | EXTENDED_RELEASE_TABLET | ORAL | 0 refills | Status: DC
  Filled 2024-02-23: qty 30, 30d supply, fill #0

## 2024-02-23 NOTE — Progress Notes (Signed)
 Remote Loop Recorder Transmission

## 2024-02-24 ENCOUNTER — Other Ambulatory Visit (HOSPITAL_COMMUNITY): Payer: Self-pay

## 2024-02-24 DIAGNOSIS — F411 Generalized anxiety disorder: Secondary | ICD-10-CM | POA: Diagnosis not present

## 2024-02-25 DIAGNOSIS — F341 Dysthymic disorder: Secondary | ICD-10-CM | POA: Diagnosis not present

## 2024-02-25 DIAGNOSIS — F900 Attention-deficit hyperactivity disorder, predominantly inattentive type: Secondary | ICD-10-CM | POA: Diagnosis not present

## 2024-03-03 ENCOUNTER — Other Ambulatory Visit: Payer: Self-pay | Admitting: Medical Genetics

## 2024-03-03 DIAGNOSIS — F341 Dysthymic disorder: Secondary | ICD-10-CM | POA: Diagnosis not present

## 2024-03-03 DIAGNOSIS — Z006 Encounter for examination for normal comparison and control in clinical research program: Secondary | ICD-10-CM

## 2024-03-03 DIAGNOSIS — F900 Attention-deficit hyperactivity disorder, predominantly inattentive type: Secondary | ICD-10-CM | POA: Diagnosis not present

## 2024-03-04 ENCOUNTER — Other Ambulatory Visit (HOSPITAL_COMMUNITY): Payer: Self-pay | Admitting: Neurosurgery

## 2024-03-04 DIAGNOSIS — D320 Benign neoplasm of cerebral meninges: Secondary | ICD-10-CM

## 2024-03-06 ENCOUNTER — Other Ambulatory Visit (HOSPITAL_COMMUNITY): Payer: Self-pay

## 2024-03-09 DIAGNOSIS — F411 Generalized anxiety disorder: Secondary | ICD-10-CM | POA: Diagnosis not present

## 2024-03-10 DIAGNOSIS — F341 Dysthymic disorder: Secondary | ICD-10-CM | POA: Diagnosis not present

## 2024-03-10 DIAGNOSIS — F900 Attention-deficit hyperactivity disorder, predominantly inattentive type: Secondary | ICD-10-CM | POA: Diagnosis not present

## 2024-03-11 ENCOUNTER — Ambulatory Visit

## 2024-03-11 ENCOUNTER — Encounter

## 2024-03-11 DIAGNOSIS — I639 Cerebral infarction, unspecified: Secondary | ICD-10-CM

## 2024-03-11 LAB — CUP PACEART REMOTE DEVICE CHECK
Date Time Interrogation Session: 20251022233722
Implantable Pulse Generator Implant Date: 20250409

## 2024-03-13 ENCOUNTER — Ambulatory Visit: Payer: Self-pay | Admitting: Cardiology

## 2024-03-13 NOTE — Progress Notes (Signed)
 Remote Loop Recorder Transmission

## 2024-03-17 ENCOUNTER — Encounter: Admitting: Nutrition

## 2024-03-17 DIAGNOSIS — F900 Attention-deficit hyperactivity disorder, predominantly inattentive type: Secondary | ICD-10-CM | POA: Diagnosis not present

## 2024-03-17 DIAGNOSIS — F341 Dysthymic disorder: Secondary | ICD-10-CM | POA: Diagnosis not present

## 2024-03-23 ENCOUNTER — Ambulatory Visit (HOSPITAL_COMMUNITY)
Admission: RE | Admit: 2024-03-23 | Discharge: 2024-03-23 | Disposition: A | Discharge status: Home / Self Care | Source: Ambulatory Visit | Attending: Neurosurgery | Admitting: Neurosurgery

## 2024-03-23 DIAGNOSIS — D320 Benign neoplasm of cerebral meninges: Secondary | ICD-10-CM | POA: Diagnosis not present

## 2024-03-23 MED ORDER — GADOBUTROL 1 MMOL/ML IV SOLN
6.5000 mL | Freq: Once | INTRAVENOUS | Status: AC | PRN
  Administered 2024-03-23: 6.5 mL via INTRAVENOUS

## 2024-03-24 ENCOUNTER — Encounter

## 2024-03-24 ENCOUNTER — Other Ambulatory Visit: Payer: Self-pay

## 2024-03-24 ENCOUNTER — Other Ambulatory Visit (HOSPITAL_COMMUNITY): Payer: Self-pay

## 2024-03-26 DIAGNOSIS — F900 Attention-deficit hyperactivity disorder, predominantly inattentive type: Secondary | ICD-10-CM | POA: Diagnosis not present

## 2024-03-26 DIAGNOSIS — F341 Dysthymic disorder: Secondary | ICD-10-CM | POA: Diagnosis not present

## 2024-03-31 DIAGNOSIS — F341 Dysthymic disorder: Secondary | ICD-10-CM | POA: Diagnosis not present

## 2024-03-31 DIAGNOSIS — F900 Attention-deficit hyperactivity disorder, predominantly inattentive type: Secondary | ICD-10-CM | POA: Diagnosis not present

## 2024-04-05 DIAGNOSIS — D320 Benign neoplasm of cerebral meninges: Secondary | ICD-10-CM | POA: Diagnosis not present

## 2024-04-06 DIAGNOSIS — F411 Generalized anxiety disorder: Secondary | ICD-10-CM | POA: Diagnosis not present

## 2024-04-07 DIAGNOSIS — F900 Attention-deficit hyperactivity disorder, predominantly inattentive type: Secondary | ICD-10-CM | POA: Diagnosis not present

## 2024-04-07 DIAGNOSIS — F341 Dysthymic disorder: Secondary | ICD-10-CM | POA: Diagnosis not present

## 2024-04-11 ENCOUNTER — Ambulatory Visit

## 2024-04-12 ENCOUNTER — Encounter

## 2024-04-14 ENCOUNTER — Other Ambulatory Visit: Payer: Self-pay

## 2024-04-14 ENCOUNTER — Other Ambulatory Visit: Payer: Self-pay | Admitting: Family Medicine

## 2024-04-14 ENCOUNTER — Ambulatory Visit: Attending: Cardiology

## 2024-04-14 ENCOUNTER — Other Ambulatory Visit (HOSPITAL_COMMUNITY): Payer: Self-pay

## 2024-04-14 DIAGNOSIS — F341 Dysthymic disorder: Secondary | ICD-10-CM | POA: Diagnosis not present

## 2024-04-14 DIAGNOSIS — I639 Cerebral infarction, unspecified: Secondary | ICD-10-CM

## 2024-04-14 DIAGNOSIS — F900 Attention-deficit hyperactivity disorder, predominantly inattentive type: Secondary | ICD-10-CM | POA: Diagnosis not present

## 2024-04-14 MED ORDER — FAMOTIDINE 20 MG PO TABS
20.0000 mg | ORAL_TABLET | Freq: Two times a day (BID) | ORAL | 0 refills | Status: AC
  Filled 2024-04-14: qty 180, 90d supply, fill #0

## 2024-04-16 LAB — CUP PACEART REMOTE DEVICE CHECK
Date Time Interrogation Session: 20251125233819
Implantable Pulse Generator Implant Date: 20250409

## 2024-04-19 ENCOUNTER — Ambulatory Visit: Payer: Self-pay | Admitting: Cardiology

## 2024-04-19 NOTE — Progress Notes (Signed)
 Remote Loop Recorder Transmission

## 2024-04-20 DIAGNOSIS — F411 Generalized anxiety disorder: Secondary | ICD-10-CM | POA: Diagnosis not present

## 2024-04-21 ENCOUNTER — Other Ambulatory Visit: Payer: Self-pay

## 2024-04-21 ENCOUNTER — Encounter: Payer: Self-pay | Admitting: Adult Health

## 2024-04-21 ENCOUNTER — Telehealth: Admitting: Adult Health

## 2024-04-21 DIAGNOSIS — F411 Generalized anxiety disorder: Secondary | ICD-10-CM | POA: Diagnosis not present

## 2024-04-21 DIAGNOSIS — F900 Attention-deficit hyperactivity disorder, predominantly inattentive type: Secondary | ICD-10-CM | POA: Diagnosis not present

## 2024-04-21 DIAGNOSIS — F331 Major depressive disorder, recurrent, moderate: Secondary | ICD-10-CM | POA: Diagnosis not present

## 2024-04-21 DIAGNOSIS — F341 Dysthymic disorder: Secondary | ICD-10-CM | POA: Diagnosis not present

## 2024-04-21 MED ORDER — METHYLPHENIDATE HCL ER (OSM) 36 MG PO TBCR
36.0000 mg | EXTENDED_RELEASE_TABLET | ORAL | 0 refills | Status: DC
  Filled 2024-04-21: qty 30, 30d supply, fill #0

## 2024-04-21 NOTE — Progress Notes (Signed)
 Brianna Griffin [id number] 1986/01/16 38 y.o.  Virtual Visit via Video Note  I connected with pt @ on 04/21/24 at 11:30 AM EST by a video enabled telemedicine application and verified that I am speaking with the correct person using two identifiers.   I discussed the limitations of evaluation and management by telemedicine and the availability of in person appointments. The patient expressed understanding and agreed to proceed.  I discussed the assessment and treatment plan with the patient. The patient was provided an opportunity to ask questions and all were answered. The patient agreed with the plan and demonstrated an understanding of the instructions.   The patient was advised to call back or seek an in-person evaluation if the symptoms worsen or if the condition fails to improve as anticipated.  I provided 20 minutes of non-face-to-face time during this encounter.  The patient was located at home.  The provider was located at [facility].   Angeline LOISE Sayers, NP   Subjective:   Patient ID:  Brianna Griffin is a 38 y.o. (DOB 07/30/1985) female.  Chief Complaint: No chief complaint on file.   HPI Srihitha Tagliaferri presents for follow-up of MDD and GAD.  Describes mood today as variable. Pleasant. Reports some tearfulness. Mood symptoms - reports depression, anxiety, and irritability. Reports improved interest and motivation. Reports one recent panic attack. Reports some worry, rumination and over thinking. Denies obsessive thoughts or acts. Reports some issues with anger and rage. Reports mood is variable. Stating I feel like I'm doing ok for the moment. Feels like current medications are helpful. Would like to increase dose of Concerta  from 27mg  to 36mg  daily. Taking medications as prescribed.  Energy levels improved. Active, does not have a regular exercise routine. Enjoys some usual interests and activities. Married. Lives with husband and 3 children. Spending time  with family. Appetite adequate. Weight loss - 138 from 150 pounds - 65. Sleeps well most nights. Averages 7 hours. Reports focus and concentration difficulties - it has improved some. Completing tasks. Managing aspects of household. Works F/T for [employer]. Denies SI or HI.  Denies AH or VH. Denies self harm. Denies substance use.   Seeing therapist - Asberry Hoffman  Review of Systems:  Review of Systems  Musculoskeletal:  Negative for gait problem.  Neurological:  Negative for tremors.  Psychiatric/Behavioral:         Please refer to HPI    Medications: I have reviewed the patient's current medications.  Current Outpatient Medications  Medication Sig Dispense Refill   acetaminophen  (TYLENOL ) 500 MG tablet Take 1,000 mg by mouth every 6 (six) hours as needed for mild pain (pain score 1-3).     albuterol  (VENTOLIN  HFA) 108 (90 Base) MCG/ACT inhaler Inhale 2 puffs into the lungs every 6 (six) hours as needed for wheezing or shortness of breath. 6.7 g 0   amphetamine -dextroamphetamine  (ADDERALL  XR) 20 MG 24 hr capsule Take 1 capsule (20 mg total) by mouth daily. 30 capsule 0   aspirin  EC 81 MG tablet Take 1 tablet (81 mg total) by mouth daily with breakfast. Swallow whole. 90 tablet 3   atorvastatin  (LIPITOR) 40 MG tablet Take 1 tablet (40 mg total) by mouth daily. 90 tablet 3   Calcium  Carbonate Antacid (TUMS PO) Take 1 tablet by mouth daily as needed (heartburn).     famotidine  (PEPCID ) 20 MG tablet Take 1 tablet (20 mg total) by mouth 2 (two) times daily as needed for acid reflux 180 tablet 0  methylphenidate  (CONCERTA ) 27 MG PO CR tablet Take 1 tablet (27 mg total) by mouth every morning. 30 tablet 0   methylphenidate  (CONCERTA ) 27 MG PO CR tablet Take 1 tablet (27 mg total) by mouth every morning. 30 tablet 0   methylphenidate  (CONCERTA ) 27 MG PO CR tablet Take 1 tablet (27 mg total) by mouth every morning. 30 tablet 0   ondansetron  (ZOFRAN -ODT) 4 MG disintegrating tablet  Dissolve 1 tablet (4 mg total) by mouth every 8 (eight) hours as needed for nausea. 30 tablet 0   ondansetron  (ZOFRAN -ODT) 4 MG disintegrating tablet Take 1 tablet (4 mg total) by mouth every 6 (six) hours as needed for nausea. 30 tablet 2   PHEXXI  1.8-1-0.4 % GEL Place 1 Application vaginally daily as needed (contraceptive). Spermicidal     sertraline  (ZOLOFT ) 100 MG tablet Take 1 tablet (100 mg total) by mouth daily. 90 tablet 3   No current facility-administered medications for this visit.    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   IBS (irritable bowel syndrome)    Migraine headache    Migraines    Miscarriage 09/09/2013   Thyroid  disease    hypothyroid   Vaginal Pap smear, abnormal     Family History  Problem Relation Age of Onset   Diabetes Maternal Grandmother    Hypertension Maternal Grandmother    Cancer Maternal Grandfather        bladder and liver   Crohn's disease Maternal Grandfather    Diabetes Father    Hypertension Father    Post-traumatic stress disorder Father    ADD / ADHD Brother    Depression Brother    ADD / ADHD Sister    Depression Sister    ADD / ADHD Daughter    Epilepsy Daughter    Epilepsy Son    ADD / ADHD Son    Autism Son     Social History   Socioeconomic History   Marital status: Married    Spouse name: Analys Ryden   Number of children: Not on file   Years of education: Not on file   Highest education level: Associate degree: academic program  Occupational History   Not on file  Tobacco Use   Smoking status: Never   Smokeless tobacco: Never  Vaping Use   Vaping status: Never Used  Substance and Sexual Activity   Alcohol use: Not  Currently    Comment: rare   Drug use: No   Sexual activity: Yes    Birth control/protection: Inserts  Other Topics Concern   Not on file  Social History Narrative   Not on file   Social Drivers of Health   Financial Resource Strain: Low Risk  (08/18/2023)   Overall Financial Resource Strain (CARDIA)    Difficulty of Paying Living Expenses: Not very hard  Food Insecurity: No Food Insecurity (08/25/2023)   Hunger Vital Sign    Worried About Running Out of Food in the Last Year: Never true    Ran Out of Food in the Last Year: Never true  Transportation Needs: No Transportation Needs (08/25/2023)   PRAPARE - Administrator, Civil Service (Medical):  No    Lack of Transportation (Non-Medical): No  Physical Activity: Inactive (08/18/2023)   Exercise Vital Sign    Days of Exercise per Week: 0 days    Minutes of Exercise per Session: 0 min  Stress: Stress Concern Present (08/18/2023)   [employer] of Occupational Health - Occupational Stress Questionnaire    Feeling of Stress : Rather much  Social Connections: Socially Integrated (08/18/2023)   Social Connection and Isolation Panel    Frequency of Communication with Friends and Family: Twice a week    Frequency of Social Gatherings with Friends and Family: Once a week    Attends Religious Services: More than 4 times per year    Active Member of [employer] or Organizations: Yes    Attends Banker Meetings: More than 4 times per year    Marital Status: Married  Catering Manager Violence: Not At Risk (08/25/2023)   Humiliation, Afraid, Rape, and Kick questionnaire    Fear of Current or Ex-Partner: No    Emotionally Abused: No    Physically Abused: No    Sexually Abused: No    Past Medical History, Surgical history, Social history, and Family history were reviewed and updated as appropriate.   Please see review of systems for further details on the patient's review from today.   Objective:   Physical Exam:   There were no vitals taken for this visit.  Physical Exam Constitutional:      General: She is not in acute distress. Musculoskeletal:        General: No deformity.  Neurological:     Mental Status: She is alert and oriented to person, place, and time.     Coordination: Coordination normal.  Psychiatric:        Attention and Perception: Attention and perception normal. She does not perceive auditory or visual hallucinations.        Mood and Affect: Mood normal. Mood is not anxious or depressed. Affect is not labile, blunt, angry or inappropriate.        Speech: Speech normal.        Behavior: Behavior normal.        Thought Content: Thought content normal. Thought content is not paranoid or delusional. Thought content does not include homicidal or suicidal ideation. Thought content does not include homicidal or suicidal plan.        Cognition and Memory: Cognition and memory normal.        Judgment: Judgment normal.     Comments: Insight intact     Lab Review:     Component Value Date/Time   NA 136 08/23/2023 1051   NA 139 10/12/2021 0810   K 4.0 08/23/2023 1051   CL 103 08/23/2023 1051   CO2 24 08/23/2023 1051   GLUCOSE 96 08/23/2023 1051   BUN 9 08/23/2023 1051   BUN 11 10/12/2021 0810   CREATININE 0.75 08/23/2023 1051   CALCIUM  9.4 08/23/2023 1051   PROT 7.2 10/12/2021 0810   ALBUMIN 4.7 10/12/2021 0810   AST 12 10/12/2021 0810   ALT 13 10/12/2021 0810   ALKPHOS 111 10/12/2021 0810   BILITOT 0.6 10/12/2021 0810   GFRNONAA >60 08/23/2023 1051   GFRAA 138 06/14/2020 1651       Component Value Date/Time   WBC 5.6 12/30/2023 1408   WBC 6.9 08/23/2023 1051   RBC 4.23 12/30/2023 1408   RBC 4.57 08/23/2023 1051   HGB 12.8 12/30/2023 1408   HCT 38.2 12/30/2023 1408  PLT 167 12/30/2023 1408   MCV 90 12/30/2023 1408   MCH 30.3 12/30/2023 1408   MCH 29.3 08/23/2023 1051   MCHC 33.5 12/30/2023 1408   MCHC 33.3 08/23/2023 1051   RDW 13.1 12/30/2023 1408    LYMPHSABS 1.4 08/23/2023 1051   LYMPHSABS 1.9 06/14/2020 1651   MONOABS 0.3 08/23/2023 1051   EOSABS 0.1 08/23/2023 1051   EOSABS 0.1 06/14/2020 1651   BASOSABS 0.0 08/23/2023 1051   BASOSABS 0.0 06/14/2020 1651    No results found for: POCLITH, LITHIUM   No results found for: PHENYTOIN, PHENOBARB, VALPROATE, CBMZ   .res Assessment: Plan:    Plan:  PDMP reviewed  Increase Concerta  27mg  to 36mg  every morning  Continue Zoloft  100mg  daily  Hydroxyzine  25mg  up to 4 x times - using as needed  Genesight testing on file  Screening tools: [facility] ADD test 39/58 - ADD possible Connors positive for ADD inattentive type  RTC 4 weeks  20 minutes spent dedicated to the care of this patient on the date of this encounter to include pre-visit review of records, ordering of medication, post visit documentation, and face-to-face time with the patient discussing MDD and GAD. Discussed continuing current medication regimen.  Patient advised to contact office with any questions, adverse effects, or acute worsening in signs and symptoms.    There are no diagnoses linked to this encounter.   Please see After Visit Summary for patient specific instructions.  Future Appointments  Date Time Provider Department Center  04/21/2024 11:30 AM Hadyn Azer, Angeline Mattocks, NP [facility] None  04/22/2024  9:15 AM Alm Delon SAILOR, DO [facility] None  05/15/2024  7:15 AM [facility] HVT DEVICE REMOTES [facility] H&V  06/15/2024  7:15 AM [facility] HVT DEVICE REMOTES [facility] H&V  07/08/2024  2:30 PM Rosemarie Eather RAMAN, MD [facility] None    No orders of the defined types were placed in this encounter.     -------------------------------

## 2024-04-22 ENCOUNTER — Encounter: Payer: Self-pay | Admitting: Dermatology

## 2024-04-22 ENCOUNTER — Ambulatory Visit: Payer: 59 | Admitting: Dermatology

## 2024-04-22 VITALS — BP 103/65

## 2024-04-22 DIAGNOSIS — D229 Melanocytic nevi, unspecified: Secondary | ICD-10-CM

## 2024-04-22 DIAGNOSIS — L821 Other seborrheic keratosis: Secondary | ICD-10-CM | POA: Diagnosis not present

## 2024-04-22 DIAGNOSIS — Z1283 Encounter for screening for malignant neoplasm of skin: Secondary | ICD-10-CM | POA: Diagnosis not present

## 2024-04-22 DIAGNOSIS — W908XXA Exposure to other nonionizing radiation, initial encounter: Secondary | ICD-10-CM | POA: Diagnosis not present

## 2024-04-22 DIAGNOSIS — L578 Other skin changes due to chronic exposure to nonionizing radiation: Secondary | ICD-10-CM

## 2024-04-22 DIAGNOSIS — L910 Hypertrophic scar: Secondary | ICD-10-CM

## 2024-04-22 DIAGNOSIS — D485 Neoplasm of uncertain behavior of skin: Secondary | ICD-10-CM

## 2024-04-22 DIAGNOSIS — D225 Melanocytic nevi of trunk: Secondary | ICD-10-CM | POA: Diagnosis not present

## 2024-04-22 DIAGNOSIS — L814 Other melanin hyperpigmentation: Secondary | ICD-10-CM

## 2024-04-22 DIAGNOSIS — D1801 Hemangioma of skin and subcutaneous tissue: Secondary | ICD-10-CM

## 2024-04-22 MED ORDER — TRIAMCINOLONE ACETONIDE 10 MG/ML IJ SUSP
10.0000 mg | Freq: Once | INTRAMUSCULAR | Status: AC
  Administered 2024-04-22: 10 mg

## 2024-04-22 NOTE — Patient Instructions (Addendum)

## 2024-04-22 NOTE — Progress Notes (Signed)
 Follow-Up Visit   Subjective  Brianna Griffin is a 38 y.o. female who presents for the following: Skin Cancer Screening and Full Body Skin Exam - No history of skin cancer. Last visit 04/14/2023 (TBSE)  The patient presents for Total-Body Skin Exam (TBSE) for skin cancer screening and mole check. The patient has spots, moles and lesions to be evaluated, some may be new or changing and the patient may have concern these could be cancer.    The following portions of the chart were reviewed this encounter and updated as appropriate: medications, allergies, medical history  Review of Systems:  No other skin or systemic complaints except as noted in HPI or Assessment and Plan.  Objective  Well appearing patient in no apparent distress; mood and affect are within normal limits.  A full examination was performed including scalp, head, eyes, ears, nose, lips, neck, chest, axillae, abdomen, back, buttocks, bilateral upper extremities, bilateral lower extremities, hands, feet, fingers, toes, fingernails, and toenails. All findings within normal limits unless otherwise noted below.   Relevant physical exam findings are noted in the Assessment and Plan.  Left post neck Firm pink/brown dermal papule Central upper back 5 mm irregular brown macule   Assessment & Plan   SKIN CANCER SCREENING PERFORMED TODAY.  ACTINIC DAMAGE - Chronic condition, secondary to cumulative UV/sun exposure - diffuse scaly erythematous macules with underlying dyspigmentation - Recommend daily broad spectrum sunscreen SPF 30+ to sun-exposed areas, reapply every 2 hours as needed.  - Staying in the shade or wearing long sleeves, sun glasses (UVA+UVB protection) and wide brim hats (4-inch brim around the entire circumference of the hat) are also recommended for sun protection.  - Call for new or changing lesions.  LENTIGINES, SEBORRHEIC KERATOSES, HEMANGIOMAS - Benign normal skin lesions - Benign-appearing - Call  for any changes  MELANOCYTIC NEVI - Tan-brown and/or pink-flesh-colored symmetric macules and papules - Benign appearing on exam today - Observation - Call clinic for new or changing moles - Recommend daily use of broad spectrum spf 30+ sunscreen to sun-exposed areas.    KELOID Left post neck Intralesional injection - Left post neck Location: Left post neck  Informed Consent: Discussed risks (infection, pain, bleeding, bruising, thinning of the skin, loss of skin pigment, lack of resolution, and recurrence of lesion) and benefits of the procedure, as well as the alternatives. Informed consent was obtained. Preparation: The area was prepared a standard fashion.  Anesthesia: None  Procedure Details: An intralesional injection was performed with Kenalog  10 mg/cc. 0.5 cc in total were injected.  Total number of injections: 1  Plan: The patient was instructed on post-op care. Recommend OTC analgesia as needed for pain.   Related Medications triamcinolone  acetonide (KENALOG ) 10 MG/ML injection 10 mg  NEOPLASM OF UNCERTAIN BEHAVIOR OF SKIN Central upper back Epidermal / dermal shaving  Lesion diameter (cm):  0.5 Informed consent: discussed and consent obtained   Timeout: patient name, date of birth, surgical site, and procedure verified   Procedure prep:  Patient was prepped and draped in usual sterile fashion Prep type:  Isopropyl alcohol Anesthesia: the lesion was anesthetized in a standard fashion   Anesthetic:  1% lidocaine  w/ epinephrine  1-100,000 buffered w/ 8.4% NaHCO3 Instrument used: flexible razor blade   Hemostasis achieved with: pressure, aluminum chloride and electrodesiccation   Outcome: patient tolerated procedure well   Post-procedure details: sterile dressing applied and wound care instructions given   Dressing type: bandage and petrolatum    Specimen 1 - Surgical  pathology Differential Diagnosis: Nevus vs dysplastic nevus  Check Margins: No SKIN EXAM FOR  MALIGNANT NEOPLASM   MULTIPLE BENIGN MELANOCYTIC NEVI   CHERRY ANGIOMA   LENTIGINES   SEBORRHEIC KERATOSIS   ACTINIC SKIN DAMAGE   Return in about 1 year (around 04/22/2025) for TBSE.  I, Roseline Hutchinson, CMA, am acting as scribe for [employer], DO .   Documentation: I have reviewed the above documentation for accuracy and completeness, and I agree with the above.  Delon Lenis, DO

## 2024-04-26 ENCOUNTER — Emergency Department (HOSPITAL_COMMUNITY)

## 2024-04-26 ENCOUNTER — Encounter (HOSPITAL_COMMUNITY): Payer: Self-pay

## 2024-04-26 ENCOUNTER — Other Ambulatory Visit: Payer: Self-pay

## 2024-04-26 ENCOUNTER — Emergency Department (HOSPITAL_COMMUNITY)
Admission: EM | Admit: 2024-04-26 | Discharge: 2024-04-27 | Disposition: A | Attending: Emergency Medicine | Admitting: Emergency Medicine

## 2024-04-26 DIAGNOSIS — Z7982 Long term (current) use of aspirin: Secondary | ICD-10-CM | POA: Diagnosis not present

## 2024-04-26 DIAGNOSIS — R202 Paresthesia of skin: Secondary | ICD-10-CM

## 2024-04-26 DIAGNOSIS — R531 Weakness: Secondary | ICD-10-CM | POA: Diagnosis not present

## 2024-04-26 DIAGNOSIS — R9431 Abnormal electrocardiogram [ECG] [EKG]: Secondary | ICD-10-CM | POA: Diagnosis not present

## 2024-04-26 LAB — COMPREHENSIVE METABOLIC PANEL WITH GFR
ALT: 15 U/L (ref 0–44)
AST: 19 U/L (ref 15–41)
Albumin: 4.7 g/dL (ref 3.5–5.0)
Alkaline Phosphatase: 73 U/L (ref 38–126)
Anion gap: 14 (ref 5–15)
BUN: 11 mg/dL (ref 6–20)
CO2: 22 mmol/L (ref 22–32)
Calcium: 9.2 mg/dL (ref 8.9–10.3)
Chloride: 101 mmol/L (ref 98–111)
Creatinine, Ser: 0.68 mg/dL (ref 0.44–1.00)
GFR, Estimated: >60 mL/min (ref >60)
Glucose, Bld: 102 mg/dL — ABNORMAL HIGH (ref 70–99)
Potassium: 3.2 mmol/L — ABNORMAL LOW (ref 3.5–5.1)
Sodium: 137 mmol/L (ref 135–145)
Total Bilirubin: 0.9 mg/dL (ref 0.0–1.2)
Total Protein: 7.6 g/dL (ref 6.5–8.1)

## 2024-04-26 LAB — CBC WITH DIFFERENTIAL/PLATELET
Abs Immature Granulocytes: 0.01 K/uL (ref 0.00–0.07)
Basophils Absolute: 0 K/uL (ref 0.0–0.1)
Basophils Relative: 0 %
Eosinophils Absolute: 0.2 K/uL (ref 0.0–0.5)
Eosinophils Relative: 3 %
HCT: 37.6 % (ref 36.0–46.0)
Hemoglobin: 12.9 g/dL (ref 12.0–15.0)
Immature Granulocytes: 0 %
Lymphocytes Relative: 29 %
Lymphs Abs: 1.7 K/uL (ref 0.7–4.0)
MCH: 29.9 pg (ref 26.0–34.0)
MCHC: 34.3 g/dL (ref 30.0–36.0)
MCV: 87 fL (ref 80.0–100.0)
Monocytes Absolute: 0.4 K/uL (ref 0.1–1.0)
Monocytes Relative: 6 %
Neutro Abs: 3.6 K/uL (ref 1.7–7.7)
Neutrophils Relative %: 62 %
Platelets: 170 K/uL (ref 150–400)
RBC: 4.32 MIL/uL (ref 3.87–5.11)
RDW: 12.8 % (ref 11.5–15.5)
WBC: 5.8 K/uL (ref 4.0–10.5)
nRBC: 0 % (ref 0.0–0.2)

## 2024-04-26 LAB — SURGICAL PATHOLOGY

## 2024-04-26 LAB — PREGNANCY, URINE: Preg Test, Ur: NEGATIVE

## 2024-04-26 NOTE — ED Provider Notes (Signed)
 11:07 PM Assumed care from Dr. Zammit, please see their note for full history, physical and decision making until this point. In brief this is a 38 y.o. year old female who presented to the ED tonight with Numbness (Left arm)     Mild R periscapular numbness. Unlikely distribution for a stroke but has a history of recent stroke in  April. Pending workup.  Workup negative. Discussed with neurology and thought low likelihood of new CVA but with history recommend MRI to rule it out. Can d/c if negative.   Discharge instructions, including strict return precautions for new or worsening symptoms, given. Patient and/or family verbalized understanding and agreement with the plan as described.   Labs, studies and imaging reviewed by myself and considered in medical decision making if ordered. Imaging interpreted by radiology.  Labs Reviewed  CBC WITH DIFFERENTIAL/PLATELET  COMPREHENSIVE METABOLIC PANEL WITH GFR    CT Head Wo Contrast    (Results Pending)  DG Chest 2 View    (Results Pending)  CT ANGIO HEAD NECK W WO CM    (Results Pending)    No follow-ups on file.

## 2024-04-26 NOTE — ED Provider Notes (Signed)
 [place] EMERGENCY DEPARTMENT AT [facility] Provider Note   CSN: [id number] Arrival date & time: 04/26/24  2238     Patient presents with: Numbness (Left arm)   Brianna Griffin is a 38 y.o. female.  {Add pertinent medical, surgical, social history, OB history to YEP:67052} Patient states that she started with numbness to her right scapula.  Patient complains of no weakness.  Patient states that back in April she had a stroke but that time she had numbness on her entire right side and some weakness.  MRI of the brain at that time did show small lacunar infarct.  Patient is on aspirin   The history is provided by the patient and medical records. No language interpreter was used.  Weakness Severity: Numbness to right scapula. Onset quality:  Sudden Timing:  Intermittent Progression:  Waxing and waning Chronicity:  New Context: not alcohol use   Relieved by:  None tried Worsened by:  Nothing Ineffective treatments:  None tried Associated symptoms: no abdominal pain, no chest pain, no cough, no diarrhea, no frequency, no headaches and no seizures        Prior to Admission medications   Medication Sig Start Date End Date Taking? Authorizing Provider  acetaminophen  (TYLENOL ) 500 MG tablet Take 1,000 mg by mouth every 6 (six) hours as needed for mild pain (pain score 1-3).    [provider]  albuterol  (VENTOLIN  HFA) 108 (90 Base) MCG/ACT inhaler Inhale 2 puffs into the lungs every 6 (six) hours as needed for wheezing or shortness of breath. 04/03/23   Cook, Jayce G, DO  amphetamine -dextroamphetamine  (ADDERALL  XR) 20 MG 24 hr capsule Take 1 capsule (20 mg total) by mouth daily. 01/20/24   Mozingo, Regina Nattalie, NP  aspirin  EC 81 MG tablet Take 1 tablet (81 mg total) by mouth daily with breakfast. Swallow whole. 09/01/23   Mauro Elveria BROCKS, NP  atorvastatin  (LIPITOR) 40 MG tablet Take 1 tablet (40 mg total) by mouth daily. 12/17/23 06/22/24  Cook, Jayce G, DO   Calcium  Carbonate Antacid (TUMS PO) Take 1 tablet by mouth daily as needed (heartburn).    [provider]  famotidine  (PEPCID ) 20 MG tablet Take 1 tablet (20 mg total) by mouth 2 (two) times daily as needed for acid reflux 04/14/24   Cook, Jayce G, DO  methylphenidate  (CONCERTA ) 36 MG PO CR tablet Take 1 tablet (36 mg total) by mouth every morning. 04/21/24   Mozingo, Regina Nattalie, NP  ondansetron  (ZOFRAN -ODT) 4 MG disintegrating tablet Dissolve 1 tablet (4 mg total) by mouth every 8 (eight) hours as needed for nausea. 08/18/23   Hoskins, Carolyn C, NP  ondansetron  (ZOFRAN -ODT) 4 MG disintegrating tablet Take 1 tablet (4 mg total) by mouth every 6 (six) hours as needed for nausea. 01/08/24   Cresenzo-Dishmon, Cathlean, CNM  PHEXXI  1.8-1-0.4 % GEL Place 1 Application vaginally daily as needed (contraceptive). Spermicidal 07/25/23   [provider]  sertraline  (ZOLOFT ) 100 MG tablet Take 1 tablet (100 mg total) by mouth daily. 10/21/23   Mozingo, Regina Nattalie, NP    Allergies: Ibuprofen     Review of Systems  Constitutional:  Negative for appetite change and fatigue.  HENT:  Negative for congestion, ear discharge and sinus pressure.   Eyes:  Negative for discharge.  Respiratory:  Negative for cough.   Cardiovascular:  Negative for chest pain.  Gastrointestinal:  Negative for abdominal pain and diarrhea.  Genitourinary:  Negative for frequency and hematuria.  Musculoskeletal:  Negative for back pain.  Skin:  Negative for rash.  Neurological:  Positive for weakness. Negative for seizures and headaches.       Numbness right scapula  Psychiatric/Behavioral:  Negative for hallucinations.     Updated Vital Signs BP (!) 141/92   Pulse 94   Temp 98.3 F (36.8 C) (Oral)   Resp 17   Ht 5' 4 (1.626 m)   Wt 62.6 kg   LMP 03/25/2024 (Approximate)   SpO2 100%   BMI 23.69 kg/m   Physical Exam Vitals and nursing note reviewed.  Constitutional:      Appearance: Normal  appearance. She is well-developed.  HENT:     Head: Normocephalic.     Nose: Nose normal.  Eyes:     General: No scleral icterus.    Conjunctiva/sclera: Conjunctivae normal.  Neck:     Thyroid : No thyromegaly.  Cardiovascular:     Rate and Rhythm: Normal rate and regular rhythm.     Heart sounds: No murmur heard.    No friction rub. No gallop.  Pulmonary:     Breath sounds: No stridor. No wheezing or rales.  Chest:     Chest wall: No tenderness.  Abdominal:     General: There is no distension.     Tenderness: There is no abdominal tenderness. There is no rebound.  Musculoskeletal:        General: Normal range of motion.     Cervical back: Neck supple.  Lymphadenopathy:     Cervical: No cervical adenopathy.  Skin:    Findings: No erythema or rash.  Neurological:     Mental Status: She is alert and oriented to person, place, and time.     Motor: No abnormal muscle tone.     Coordination: Coordination normal.  Psychiatric:        Behavior: Behavior normal.     (all labs ordered are listed, but only abnormal results are displayed) Labs Reviewed - No data to display  EKG: None  Radiology: No results found.  {Document cardiac monitor, telemetry assessment procedure when appropriate:32947} Procedures   Medications Ordered in the ED - No data to display    {Click here for ABCD2, HEART and other calculators REFRESH Note before signing:1}                              Medical Decision Making  Numbness to upper back  {Document critical care time when appropriate  Document review of labs and clinical decision tools ie CHADS2VASC2, etc  Document your independent review of radiology images and any outside records  Document your discussion with family members, caretakers and with consultants  Document social determinants of health affecting pt's care  Document your decision making why or why not admission, treatments were needed:32947:::1}   Final diagnoses:  None     ED Discharge Orders     None

## 2024-04-26 NOTE — ED Triage Notes (Signed)
 Dr Suzette present during triage: pt ambulatory to triage with c/o tingling to right arm/shoulder blade that started tonight around 9:30 pm. No code stroke called at this time.

## 2024-04-27 ENCOUNTER — Emergency Department (HOSPITAL_COMMUNITY)

## 2024-04-27 ENCOUNTER — Ambulatory Visit: Payer: Self-pay | Admitting: Dermatology

## 2024-04-27 DIAGNOSIS — G939 Disorder of brain, unspecified: Secondary | ICD-10-CM | POA: Diagnosis not present

## 2024-04-27 DIAGNOSIS — R220 Localized swelling, mass and lump, head: Secondary | ICD-10-CM | POA: Diagnosis not present

## 2024-04-27 DIAGNOSIS — I6622 Occlusion and stenosis of left posterior cerebral artery: Secondary | ICD-10-CM | POA: Diagnosis not present

## 2024-04-27 DIAGNOSIS — R29818 Other symptoms and signs involving the nervous system: Secondary | ICD-10-CM | POA: Diagnosis not present

## 2024-04-27 MED ORDER — IOHEXOL 350 MG/ML SOLN
75.0000 mL | Freq: Once | INTRAVENOUS | Status: AC | PRN
  Administered 2024-04-27: 75 mL via INTRAVENOUS

## 2024-04-27 NOTE — ED Notes (Signed)
 Patient transported to MRI

## 2024-04-27 NOTE — ED Provider Notes (Signed)
 Signout from Dr. Lorette.  38 year old female with prior history of stroke here with some left shoulder upper back numbness.  She is pending MRI.  Plan from neurology was if MRI positive will need admission.  If MRI negative can be discharged and follow-up with her outpatient neuroteam.  She is currently in MRI. Physical Exam  BP 114/82   Pulse 91   Temp 98.2 F (36.8 C)   Resp 16   Ht 5' 4 (1.626 m)   Wt 62.6 kg   LMP 03/25/2024 (Approximate)   SpO2 96%   BMI 23.69 kg/m   Physical Exam  Procedures  Procedures  ED Course / MDM    Medical Decision Making Amount and/or Complexity of Data Reviewed Labs: ordered. Radiology: ordered. ECG/medicine tests: ordered.  Risk Prescription drug management.   7:40 AM.  MRI shows unchanged meningioma, no acute stroke.  I reviewed this with patient and she is comfortable plan for discharge and outpatient follow-up with her neurology team.       Towana Ozell BROCKS, MD 04/27/24 1659

## 2024-04-28 ENCOUNTER — Encounter

## 2024-04-28 DIAGNOSIS — F900 Attention-deficit hyperactivity disorder, predominantly inattentive type: Secondary | ICD-10-CM | POA: Diagnosis not present

## 2024-04-28 DIAGNOSIS — F341 Dysthymic disorder: Secondary | ICD-10-CM | POA: Diagnosis not present

## 2024-05-03 NOTE — Telephone Encounter (Signed)
 Neurosurgery note received from visit with Dr Dorn Ned on 04/05/24. Note placed in Dr Bucky office for review.

## 2024-05-04 DIAGNOSIS — F411 Generalized anxiety disorder: Secondary | ICD-10-CM | POA: Diagnosis not present

## 2024-05-05 DIAGNOSIS — F341 Dysthymic disorder: Secondary | ICD-10-CM | POA: Diagnosis not present

## 2024-05-05 DIAGNOSIS — F900 Attention-deficit hyperactivity disorder, predominantly inattentive type: Secondary | ICD-10-CM | POA: Diagnosis not present

## 2024-05-15 ENCOUNTER — Ambulatory Visit: Attending: Cardiology

## 2024-05-15 DIAGNOSIS — I639 Cerebral infarction, unspecified: Secondary | ICD-10-CM | POA: Diagnosis not present

## 2024-05-17 LAB — CUP PACEART REMOTE DEVICE CHECK
Date Time Interrogation Session: 20251226232908
Implantable Pulse Generator Implant Date: 20250409

## 2024-05-18 DIAGNOSIS — F411 Generalized anxiety disorder: Secondary | ICD-10-CM | POA: Diagnosis not present

## 2024-05-19 ENCOUNTER — Other Ambulatory Visit: Payer: Self-pay

## 2024-05-19 ENCOUNTER — Telehealth: Admitting: Adult Health

## 2024-05-19 ENCOUNTER — Encounter: Payer: Self-pay | Admitting: Adult Health

## 2024-05-19 ENCOUNTER — Other Ambulatory Visit (HOSPITAL_COMMUNITY): Payer: Self-pay

## 2024-05-19 DIAGNOSIS — F329 Major depressive disorder, single episode, unspecified: Secondary | ICD-10-CM | POA: Diagnosis not present

## 2024-05-19 DIAGNOSIS — F411 Generalized anxiety disorder: Secondary | ICD-10-CM

## 2024-05-19 DIAGNOSIS — F902 Attention-deficit hyperactivity disorder, combined type: Secondary | ICD-10-CM

## 2024-05-19 DIAGNOSIS — F331 Major depressive disorder, recurrent, moderate: Secondary | ICD-10-CM

## 2024-05-19 MED ORDER — METHYLPHENIDATE HCL ER (OSM) 36 MG PO TBCR: 36.0000 mg | EXTENDED_RELEASE_TABLET | Freq: Every day | ORAL | 0 refills | Status: AC

## 2024-05-19 MED ORDER — METHYLPHENIDATE HCL ER (OSM) 36 MG PO TBCR
36.0000 mg | EXTENDED_RELEASE_TABLET | ORAL | 0 refills | Status: AC
  Filled 2024-05-19: qty 30, 30d supply, fill #0

## 2024-05-19 NOTE — Progress Notes (Signed)
 Brianna Griffin [id number] 1985-06-11 38 y.o.  Virtual Visit via Video Note  I connected with pt @ on 05/19/2024 at 11:30 AM EST by a video enabled telemedicine application and verified that I am speaking with the correct person using two identifiers.   I discussed the limitations of evaluation and management by telemedicine and the availability of in person appointments. The patient expressed understanding and agreed to proceed.  I discussed the assessment and treatment plan with the patient. The patient was provided an opportunity to ask questions and all were answered. The patient agreed with the plan and demonstrated an understanding of the instructions.   The patient was advised to call back or seek an in-person evaluation if the symptoms worsen or if the condition fails to improve as anticipated.  I provided 15 minutes of non-face-to-face time during this encounter.  The patient was located at home.  The provider was located at [facility].   Brianna LOISE Sayers, NP   Subjective:   Patient ID:  Brianna Griffin is a 38 y.o. (DOB [birth year]) female.  Chief Complaint: No chief complaint on file.   HPI Brianna Griffin presents for follow-up of MDD and GAD.  Describes mood today as ok. Pleasant. Denies recent tearfulness. Mood symptoms - reports some depression, anxiety, and irritability - getting better. Reports improved interest and motivation. Reports one recent panic attack. Reports some worry, rumination and over thinking. Denies obsessive thoughts or acts. Reports decreased issues with anger and rage. Reports mood is variable, but more stable. Stating I feel like I'm doing ok. Feels like current medications are helpful. Taking medications as prescribed.  Energy levels improved. Active, does not have a regular exercise routine. Enjoys some usual interests and activities. Married. Lives with husband and 3 children. Spending time with family. Appetite adequate. Weight  loss - 138 pounds - 65. Sleeps well most nights. Averages 7 hours. Reports focus and concentration improved. Completing tasks. Managing aspects of household. Works F/T for [employer]. Denies SI or HI.  Denies AH or VH. Denies self harm. Denies substance use.   Seeing therapist - Brianna Griffin   Review of Systems:  Review of Systems  Musculoskeletal:  Negative for gait problem.  Neurological:  Negative for tremors.  Psychiatric/Behavioral:         Please refer to HPI    Medications: I have reviewed the patient's current medications.  Current Outpatient Medications  Medication Sig Dispense Refill   acetaminophen  (TYLENOL ) 500 MG tablet Take 1,000 mg by mouth every 6 (six) hours as needed for mild pain (pain score 1-3).     albuterol  (VENTOLIN  HFA) 108 (90 Base) MCG/ACT inhaler Inhale 2 puffs into the lungs every 6 (six) hours as needed for wheezing or shortness of breath. 6.7 g 0   amphetamine -dextroamphetamine  (ADDERALL  XR) 20 MG 24 hr capsule Take 1 capsule (20 mg total) by mouth daily. 30 capsule 0   aspirin  EC 81 MG tablet Take 1 tablet (81 mg total) by mouth daily with breakfast. Swallow whole. 90 tablet 3   atorvastatin  (LIPITOR) 40 MG tablet Take 1 tablet (40 mg total) by mouth daily. 90 tablet 3   Calcium  Carbonate Antacid (TUMS PO) Take 1 tablet by mouth daily as needed (heartburn).     famotidine  (PEPCID ) 20 MG tablet Take 1 tablet (20 mg total) by mouth 2 (two) times daily as needed for acid reflux 180 tablet 0   methylphenidate  (CONCERTA ) 36 MG PO CR tablet Take 1 tablet (36 mg total) by  mouth every morning. 30 tablet 0   ondansetron  (ZOFRAN -ODT) 4 MG disintegrating tablet Dissolve 1 tablet (4 mg total) by mouth every 8 (eight) hours as needed for nausea. 30 tablet 0   ondansetron  (ZOFRAN -ODT) 4 MG disintegrating tablet Take 1 tablet (4 mg total) by mouth every 6 (six) hours as needed for nausea. 30 tablet 2   PHEXXI  1.8-1-0.4 % GEL Place 1 Application vaginally daily  as needed (contraceptive). Spermicidal     sertraline  (ZOLOFT ) 100 MG tablet Take 1 tablet (100 mg total) by mouth daily. 90 tablet 3   No current facility-administered medications for this visit.    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   IBS (irritable bowel syndrome)    Migraine headache    Migraines    Miscarriage 09/09/2013   Thyroid  disease    hypothyroid   Vaginal Pap smear, abnormal     Family History  Problem Relation Age of Onset   Diabetes Maternal Grandmother    Hypertension Maternal Grandmother    Cancer Maternal Grandfather        bladder and liver   Crohn's disease Maternal Grandfather    Diabetes Father    Hypertension Father    Post-traumatic stress disorder Father    ADD / ADHD Brother    Depression Brother    ADD / ADHD Sister    Depression Sister    ADD / ADHD Daughter    Epilepsy Daughter    Epilepsy Son    ADD / ADHD Son    Autism Son     Social History   Socioeconomic History   Marital status: Married    Spouse name: Brianna Griffin   Number of children: Not on file   Years of education: Not on file   Highest education level: Associate degree: academic program  Occupational History   Not on file  Tobacco Use   Smoking status: Never   Smokeless tobacco: Never  Vaping Use   Vaping status: Never Used  Substance and Sexual Activity   Alcohol use: Not Currently    Comment: rare   Drug use: No   Sexual activity: Yes    Birth control/protection: Inserts  Other Topics Concern   Not on file  Social History Narrative   Not on file   Social Drivers of Health   Tobacco Use: Low Risk (04/26/2024)   Patient History    Smoking Tobacco Use: Never    Smokeless Tobacco Use: Never    Passive  Exposure: Not on file  Financial Resource Strain: Low Risk (08/18/2023)   Overall Financial Resource Strain (CARDIA)    Difficulty of Paying Living Expenses: Not very hard  Food Insecurity: No Food Insecurity (08/25/2023)   Hunger Vital Sign    Worried About Running Out of Food in the Last Year: Never true    Ran Out of Food in the Last Year: Never true  Transportation Needs: No Transportation Needs (08/25/2023)   PRAPARE - Administrator, Civil Service (Medical): No    Lack of Transportation (Non-Medical): No  Physical Activity: Inactive (08/18/2023)   Exercise Vital Sign    Days of Exercise per Week: 0 days    Minutes of Exercise per Session:  0 min  Stress: Stress Concern Present (08/18/2023)   [employer] of Occupational Health - Occupational Stress Questionnaire    Feeling of Stress : Rather much  Social Connections: Socially Integrated (08/18/2023)   Social Connection and Isolation Panel    Frequency of Communication with Friends and Family: Twice a week    Frequency of Social Gatherings with Friends and Family: Once a week    Attends Religious Services: More than 4 times per year    Active Member of Clubs or Organizations: Yes    Attends Banker Meetings: More than 4 times per year    Marital Status: Married  Catering Manager Violence: Not At Risk (08/25/2023)   Humiliation, Afraid, Rape, and Kick questionnaire    Fear of Current or Ex-Partner: No    Emotionally Abused: No    Physically Abused: No    Sexually Abused: No  Depression (PHQ2-9): Low Risk (01/14/2024)   Depression (PHQ2-9)    PHQ-2 Score: 0  Recent Concern: Depression (PHQ2-9) - Medium Risk (12/30/2023)   Depression (PHQ2-9)    PHQ-2 Score: 8  Alcohol Screen: Low Risk (11/18/2022)   Alcohol Screen    Last Alcohol Screening Score (AUDIT): 1  Housing: Low Risk (08/25/2023)   Housing Stability Vital Sign    Unable to Pay for Housing in the Last Year: No    Number of Times Moved in the Last  Year: 0    Homeless in the Last Year: No  Utilities: Not At Risk (08/25/2023)   AHC Utilities    Threatened with loss of utilities: No  Health Literacy: Not on file    Past Medical History, Surgical history, Social history, and Family history were reviewed and updated as appropriate.   Please see review of systems for further details on the patient's review from today.   Objective:   Physical Exam:  LMP 03/25/2024 (Approximate)   Physical Exam Constitutional:      General: She is not in acute distress. Musculoskeletal:        General: No deformity.  Neurological:     Mental Status: She is alert and oriented to person, place, and time.     Coordination: Coordination normal.  Psychiatric:        Attention and Perception: Attention and perception normal. She does not perceive auditory or visual hallucinations.        Mood and Affect: Mood normal. Mood is not anxious or depressed. Affect is not labile, blunt, angry or inappropriate.        Speech: Speech normal.        Behavior: Behavior normal.        Thought Content: Thought content normal. Thought content is not paranoid or delusional. Thought content does not include homicidal or suicidal ideation. Thought content does not include homicidal or suicidal plan.        Cognition and Memory: Cognition and memory normal.        Judgment: Judgment normal.     Comments: Insight intact     Lab Review:     Component Value Date/Time   NA 137 04/26/2024 2308   NA 139 10/12/2021 0810   K 3.2 (L) 04/26/2024 2308   CL 101 04/26/2024 2308   CO2 22 04/26/2024 2308   GLUCOSE 102 (H) 04/26/2024 2308   BUN 11 04/26/2024 2308   BUN 11 10/12/2021 0810   CREATININE 0.68 04/26/2024 2308   CALCIUM  9.2 04/26/2024 2308   PROT 7.6 04/26/2024 2308   PROT 7.2  10/12/2021 0810   ALBUMIN 4.7 04/26/2024 2308   ALBUMIN 4.7 10/12/2021 0810   AST 19 04/26/2024 2308   ALT 15 04/26/2024 2308   ALKPHOS 73 04/26/2024 2308   BILITOT 0.9 04/26/2024 2308    BILITOT 0.6 10/12/2021 0810   GFRNONAA >60 04/26/2024 2308   GFRAA 138 06/14/2020 1651       Component Value Date/Time   WBC 5.8 04/26/2024 2308   RBC 4.32 04/26/2024 2308   HGB 12.9 04/26/2024 2308   HGB 12.8 12/30/2023 1408   HCT 37.6 04/26/2024 2308   HCT 38.2 12/30/2023 1408   PLT 170 04/26/2024 2308   PLT 167 12/30/2023 1408   MCV 87.0 04/26/2024 2308   MCV 90 12/30/2023 1408   MCH 29.9 04/26/2024 2308   MCHC 34.3 04/26/2024 2308   RDW 12.8 04/26/2024 2308   RDW 13.1 12/30/2023 1408   LYMPHSABS 1.7 04/26/2024 2308   LYMPHSABS 1.9 06/14/2020 1651   MONOABS 0.4 04/26/2024 2308   EOSABS 0.2 04/26/2024 2308   EOSABS 0.1 06/14/2020 1651   BASOSABS 0.0 04/26/2024 2308   BASOSABS 0.0 06/14/2020 1651    No results found for: POCLITH, LITHIUM   No results found for: PHENYTOIN, PHENOBARB, VALPROATE, CBMZ   .res Assessment: Plan:    Plan:  PDMP reviewed  Concerta  36mg  every morning Zoloft  100mg  daily  Hydroxyzine  25mg  up to 4 x times - using as needed  [facility] testing on file  Screening tools: [facility] ADD test 39/58 - ADD possible Connors positive for ADD inattentive type  RTC 4 weeks  15 minutes spent dedicated to the care of this patient on the date of this encounter to include pre-visit review of records, ordering of medication, post visit documentation, and face-to-face time with the patient discussing MDD and GAD. Discussed continuing current medication regimen.  Patient advised to contact office with any questions, adverse effects, or acute worsening in signs and symptoms.   There are no diagnoses linked to this encounter.   Please see After Visit Summary for patient specific instructions.  Future Appointments  Date Time Provider Department Center  05/19/2024 11:30 AM Bunny Lowdermilk, Brianna Mattocks, NP [facility] None  06/15/2024  7:15 AM [facility] DEVICE REMOTES [facility]  07/08/2024  2:30 PM Rosemarie Eather RAMAN, MD [facility] None    No  orders of the defined types were placed in this encounter.     -------------------------------     [1]  Allergies Allergen Reactions   Ibuprofen  Hives

## 2024-05-22 ENCOUNTER — Ambulatory Visit: Payer: Self-pay | Admitting: Cardiology

## 2024-05-24 NOTE — Progress Notes (Signed)
 Remote Loop Recorder Transmission

## 2024-05-28 ENCOUNTER — Encounter: Payer: Self-pay | Admitting: Nurse Practitioner

## 2024-05-30 ENCOUNTER — Other Ambulatory Visit: Payer: Self-pay | Admitting: Nurse Practitioner

## 2024-05-30 DIAGNOSIS — K582 Mixed irritable bowel syndrome: Secondary | ICD-10-CM

## 2024-05-30 DIAGNOSIS — K219 Gastro-esophageal reflux disease without esophagitis: Secondary | ICD-10-CM

## 2024-06-03 ENCOUNTER — Encounter: Payer: Self-pay | Admitting: Internal Medicine

## 2024-06-15 ENCOUNTER — Ambulatory Visit: Attending: Cardiology

## 2024-06-15 ENCOUNTER — Other Ambulatory Visit (HOSPITAL_COMMUNITY): Payer: Self-pay

## 2024-06-15 DIAGNOSIS — I639 Cerebral infarction, unspecified: Secondary | ICD-10-CM

## 2024-06-15 LAB — CUP PACEART REMOTE DEVICE CHECK
Date Time Interrogation Session: 20260126234337
Implantable Pulse Generator Implant Date: 20250409

## 2024-06-20 ENCOUNTER — Ambulatory Visit: Payer: Self-pay | Admitting: Cardiology

## 2024-06-21 ENCOUNTER — Encounter (INDEPENDENT_AMBULATORY_CARE_PROVIDER_SITE_OTHER): Payer: Self-pay

## 2024-06-21 ENCOUNTER — Other Ambulatory Visit: Payer: Self-pay

## 2024-06-21 ENCOUNTER — Other Ambulatory Visit (HOSPITAL_COMMUNITY): Payer: Self-pay

## 2024-06-24 ENCOUNTER — Ambulatory Visit: Admitting: Internal Medicine

## 2024-06-24 NOTE — Progress Notes (Signed)
 Remote Loop Recorder Transmission

## 2024-07-08 ENCOUNTER — Ambulatory Visit: Admitting: Neurology

## 2024-07-16 ENCOUNTER — Ambulatory Visit

## 2024-08-16 ENCOUNTER — Ambulatory Visit

## 2024-09-16 ENCOUNTER — Ambulatory Visit

## 2024-10-17 ENCOUNTER — Ambulatory Visit

## 2024-11-17 ENCOUNTER — Ambulatory Visit

## 2024-12-18 ENCOUNTER — Ambulatory Visit

## 2025-01-18 ENCOUNTER — Ambulatory Visit

## 2025-02-18 ENCOUNTER — Ambulatory Visit

## 2025-03-21 ENCOUNTER — Ambulatory Visit

## 2025-04-21 ENCOUNTER — Ambulatory Visit

## 2025-05-22 ENCOUNTER — Ambulatory Visit
